# Patient Record
Sex: Male | Born: 1940
Health system: Southern US, Community
[De-identification: ages and names within clinical notes are randomized; demographics above are authoritative.]

## PROBLEM LIST (undated history)

## (undated) DIAGNOSIS — E785 Hyperlipidemia, unspecified: Secondary | ICD-10-CM

## (undated) DIAGNOSIS — K469 Unspecified abdominal hernia without obstruction or gangrene: Secondary | ICD-10-CM

## (undated) DIAGNOSIS — T7840XA Allergy, unspecified, initial encounter: Secondary | ICD-10-CM

## (undated) DIAGNOSIS — N400 Enlarged prostate without lower urinary tract symptoms: Secondary | ICD-10-CM

## (undated) DIAGNOSIS — N189 Chronic kidney disease, unspecified: Secondary | ICD-10-CM

## (undated) DIAGNOSIS — D696 Thrombocytopenia, unspecified: Secondary | ICD-10-CM

## (undated) DIAGNOSIS — K219 Gastro-esophageal reflux disease without esophagitis: Secondary | ICD-10-CM

## (undated) DIAGNOSIS — I219 Acute myocardial infarction, unspecified: Secondary | ICD-10-CM

## (undated) DIAGNOSIS — I2510 Atherosclerotic heart disease of native coronary artery without angina pectoris: Secondary | ICD-10-CM

## (undated) DIAGNOSIS — IMO0002 Reserved for concepts with insufficient information to code with codable children: Secondary | ICD-10-CM

## (undated) DIAGNOSIS — E119 Type 2 diabetes mellitus without complications: Secondary | ICD-10-CM

## (undated) DIAGNOSIS — I10 Essential (primary) hypertension: Secondary | ICD-10-CM

## (undated) DIAGNOSIS — R0789 Other chest pain: Secondary | ICD-10-CM

## (undated) DIAGNOSIS — K2270 Barrett's esophagus without dysplasia: Secondary | ICD-10-CM

## (undated) HISTORY — DX: Essential (primary) hypertension: I10

## (undated) HISTORY — DX: Allergy, unspecified, initial encounter: T78.40XA

## (undated) HISTORY — DX: Type 2 diabetes mellitus without complications: E11.9

## (undated) HISTORY — PX: CORONARY ARTERY BYPASS GRAFT: SHX141

## (undated) HISTORY — PX: SKIN CANCER EXCISION: SHX779

## (undated) HISTORY — DX: Atherosclerotic heart disease of native coronary artery without angina pectoris: I25.10

## (undated) HISTORY — DX: Other chest pain: R07.89

## (undated) HISTORY — DX: Gastro-esophageal reflux disease without esophagitis: K21.9

## (undated) HISTORY — DX: Benign prostatic hyperplasia without lower urinary tract symptoms: N40.0

## (undated) HISTORY — PX: COLONOSCOPY: SHX174

## (undated) HISTORY — PX: CHOLECYSTECTOMY: SHX55

## (undated) HISTORY — PX: LITHOTRIPSY: SUR834

## (undated) HISTORY — PX: INGUINAL HERNIA REPAIR: SUR1180

## (undated) HISTORY — DX: Barrett's esophagus without dysplasia: K22.70

## (undated) HISTORY — DX: Unspecified abdominal hernia without obstruction or gangrene: K46.9

## (undated) HISTORY — DX: Chronic kidney disease, unspecified: N18.9

## (undated) HISTORY — PX: UPPER GASTROINTESTINAL ENDOSCOPY: SHX188

## (undated) HISTORY — DX: Reserved for concepts with insufficient information to code with codable children: IMO0002

## (undated) HISTORY — DX: Acute myocardial infarction, unspecified: I21.9

## (undated) HISTORY — DX: Hyperlipidemia, unspecified: E78.5

## (undated) HISTORY — DX: Thrombocytopenia, unspecified: D69.6

---

## 1958-10-12 DIAGNOSIS — IMO0002 Reserved for concepts with insufficient information to code with codable children: Secondary | ICD-10-CM

## 1958-10-12 HISTORY — DX: Reserved for concepts with insufficient information to code with codable children: IMO0002

## 1999-03-11 ENCOUNTER — Encounter: Payer: Self-pay | Admitting: Cardiology

## 1999-03-11 ENCOUNTER — Inpatient Hospital Stay (HOSPITAL_COMMUNITY): Admission: EM | Admit: 1999-03-11 | Discharge: 1999-03-13 | Payer: Self-pay | Admitting: Emergency Medicine

## 2000-06-10 ENCOUNTER — Encounter: Payer: Self-pay | Admitting: Gastroenterology

## 2000-06-10 ENCOUNTER — Ambulatory Visit (HOSPITAL_COMMUNITY): Admission: RE | Admit: 2000-06-10 | Discharge: 2000-06-10 | Payer: Self-pay | Admitting: Gastroenterology

## 2000-06-16 ENCOUNTER — Encounter: Payer: Self-pay | Admitting: Gastroenterology

## 2000-06-16 ENCOUNTER — Ambulatory Visit (HOSPITAL_COMMUNITY): Admission: RE | Admit: 2000-06-16 | Discharge: 2000-06-16 | Payer: Self-pay | Admitting: Gastroenterology

## 2001-02-18 ENCOUNTER — Encounter: Payer: Self-pay | Admitting: Cardiovascular Disease

## 2001-02-18 ENCOUNTER — Inpatient Hospital Stay (HOSPITAL_COMMUNITY): Admission: EM | Admit: 2001-02-18 | Discharge: 2001-02-22 | Payer: Self-pay | Admitting: Emergency Medicine

## 2001-02-20 ENCOUNTER — Encounter: Payer: Self-pay | Admitting: Cardiovascular Disease

## 2002-03-17 ENCOUNTER — Inpatient Hospital Stay (HOSPITAL_COMMUNITY): Admission: EM | Admit: 2002-03-17 | Discharge: 2002-03-17 | Payer: Self-pay | Admitting: *Deleted

## 2002-03-17 ENCOUNTER — Encounter: Payer: Self-pay | Admitting: Cardiovascular Disease

## 2002-10-30 ENCOUNTER — Other Ambulatory Visit: Admission: RE | Admit: 2002-10-30 | Discharge: 2002-10-30 | Payer: Self-pay | Admitting: Family Medicine

## 2003-04-11 ENCOUNTER — Encounter: Payer: Self-pay | Admitting: Gastroenterology

## 2003-04-12 DIAGNOSIS — G4733 Obstructive sleep apnea (adult) (pediatric): Secondary | ICD-10-CM | POA: Insufficient documentation

## 2003-05-05 ENCOUNTER — Ambulatory Visit (HOSPITAL_BASED_OUTPATIENT_CLINIC_OR_DEPARTMENT_OTHER): Admission: RE | Admit: 2003-05-05 | Discharge: 2003-05-05 | Payer: Self-pay | Admitting: Family Medicine

## 2003-06-07 ENCOUNTER — Ambulatory Visit (HOSPITAL_BASED_OUTPATIENT_CLINIC_OR_DEPARTMENT_OTHER): Admission: RE | Admit: 2003-06-07 | Discharge: 2003-06-07 | Payer: Self-pay | Admitting: Family Medicine

## 2004-12-23 ENCOUNTER — Inpatient Hospital Stay (HOSPITAL_COMMUNITY): Admission: EM | Admit: 2004-12-23 | Discharge: 2004-12-24 | Payer: Self-pay | Admitting: Emergency Medicine

## 2006-12-13 ENCOUNTER — Ambulatory Visit (HOSPITAL_COMMUNITY): Admission: RE | Admit: 2006-12-13 | Discharge: 2006-12-13 | Payer: Self-pay | Admitting: Family Medicine

## 2006-12-21 ENCOUNTER — Ambulatory Visit (HOSPITAL_COMMUNITY): Admission: RE | Admit: 2006-12-21 | Discharge: 2006-12-21 | Payer: Self-pay | Admitting: Family Medicine

## 2007-01-06 ENCOUNTER — Ambulatory Visit (HOSPITAL_COMMUNITY): Admission: RE | Admit: 2007-01-06 | Discharge: 2007-01-06 | Payer: Self-pay | Admitting: Urology

## 2007-02-14 ENCOUNTER — Inpatient Hospital Stay (HOSPITAL_BASED_OUTPATIENT_CLINIC_OR_DEPARTMENT_OTHER): Admission: RE | Admit: 2007-02-14 | Discharge: 2007-02-14 | Payer: Self-pay | Admitting: Cardiovascular Disease

## 2007-02-14 HISTORY — PX: CARDIAC CATHETERIZATION: SHX172

## 2007-02-22 ENCOUNTER — Ambulatory Visit: Payer: Self-pay | Admitting: Surgery

## 2007-03-01 ENCOUNTER — Ambulatory Visit: Payer: Self-pay | Admitting: Cardiothoracic Surgery

## 2007-03-08 ENCOUNTER — Ambulatory Visit: Payer: Self-pay | Admitting: Cardiothoracic Surgery

## 2007-03-08 ENCOUNTER — Inpatient Hospital Stay (HOSPITAL_COMMUNITY): Admission: RE | Admit: 2007-03-08 | Discharge: 2007-03-14 | Payer: Self-pay | Admitting: Cardiothoracic Surgery

## 2007-04-12 ENCOUNTER — Encounter (HOSPITAL_COMMUNITY): Admission: RE | Admit: 2007-04-12 | Discharge: 2007-05-12 | Payer: Self-pay | Admitting: Cardiovascular Disease

## 2007-04-13 ENCOUNTER — Ambulatory Visit: Payer: Self-pay | Admitting: Cardiothoracic Surgery

## 2007-05-13 ENCOUNTER — Encounter (HOSPITAL_COMMUNITY): Admission: RE | Admit: 2007-05-13 | Discharge: 2007-06-12 | Payer: Self-pay | Admitting: Cardiovascular Disease

## 2007-06-13 ENCOUNTER — Encounter (HOSPITAL_COMMUNITY): Admission: RE | Admit: 2007-06-13 | Discharge: 2007-07-12 | Payer: Self-pay | Admitting: Cardiovascular Disease

## 2007-07-13 ENCOUNTER — Encounter (HOSPITAL_COMMUNITY): Admission: RE | Admit: 2007-07-13 | Discharge: 2007-08-12 | Payer: Self-pay | Admitting: Cardiovascular Disease

## 2008-09-03 ENCOUNTER — Encounter: Payer: Self-pay | Admitting: Gastroenterology

## 2008-10-14 ENCOUNTER — Emergency Department (HOSPITAL_COMMUNITY): Admission: EM | Admit: 2008-10-14 | Discharge: 2008-10-14 | Payer: Self-pay | Admitting: Emergency Medicine

## 2008-10-31 ENCOUNTER — Ambulatory Visit (HOSPITAL_COMMUNITY): Admission: RE | Admit: 2008-10-31 | Discharge: 2008-10-31 | Payer: Self-pay | Admitting: Urology

## 2008-11-08 ENCOUNTER — Ambulatory Visit: Payer: Self-pay | Admitting: Gastroenterology

## 2008-11-08 ENCOUNTER — Encounter: Payer: Self-pay | Admitting: Gastroenterology

## 2008-11-08 DIAGNOSIS — K270 Acute peptic ulcer, site unspecified, with hemorrhage: Secondary | ICD-10-CM | POA: Insufficient documentation

## 2008-11-08 DIAGNOSIS — K219 Gastro-esophageal reflux disease without esophagitis: Secondary | ICD-10-CM | POA: Insufficient documentation

## 2008-11-08 DIAGNOSIS — I10 Essential (primary) hypertension: Secondary | ICD-10-CM | POA: Insufficient documentation

## 2008-11-08 DIAGNOSIS — E785 Hyperlipidemia, unspecified: Secondary | ICD-10-CM | POA: Insufficient documentation

## 2008-11-08 DIAGNOSIS — N200 Calculus of kidney: Secondary | ICD-10-CM | POA: Insufficient documentation

## 2008-11-12 ENCOUNTER — Encounter: Payer: Self-pay | Admitting: Gastroenterology

## 2008-11-21 ENCOUNTER — Ambulatory Visit: Payer: Self-pay | Admitting: Gastroenterology

## 2008-11-21 ENCOUNTER — Encounter: Payer: Self-pay | Admitting: Gastroenterology

## 2008-11-23 ENCOUNTER — Encounter: Payer: Self-pay | Admitting: Gastroenterology

## 2009-01-03 IMAGING — CT CT ABDOMEN W/O CM
2 of 4 series · 16 of 46 positions shown, 18 images · non-contrast
Comparison: None

CT ABDOMEN

CLINICAL DATA: Left lower quadrant pain

CT ABDOMEN AND PELVIS WITHOUT CONTRAST
TECHNIQUE: Multidetector CT imaging of the abdomen and pelvis was
performed following the standard protocol without intravenous
contrast.

[Series 2: stone_wo 5.0 b40f st · axial · 0.71mm/px · z∈[-490,-78]mm · 13 of 113 slices shown, 15 images]
[im 5/113  soft-tissue]
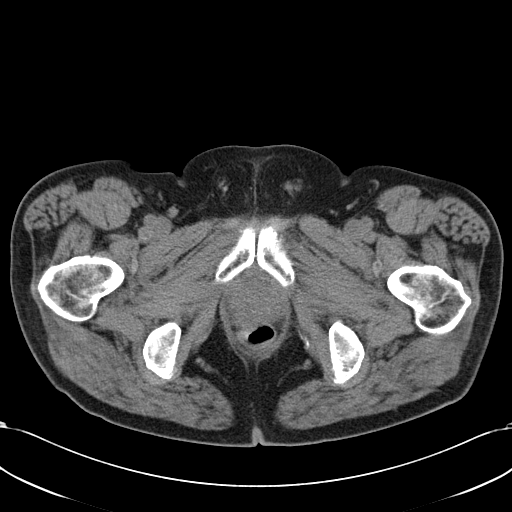
[im 5/113  bone]
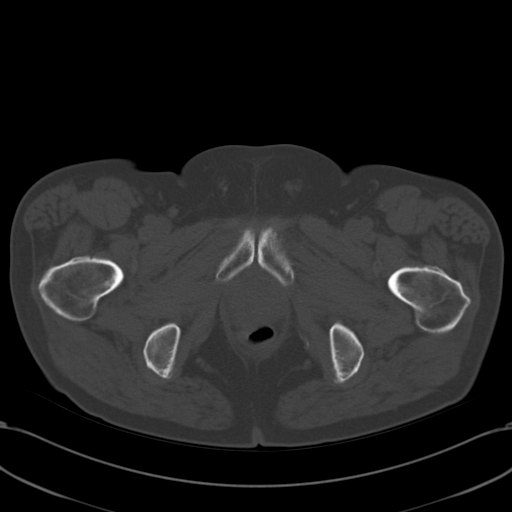
[im 15/113  soft-tissue]
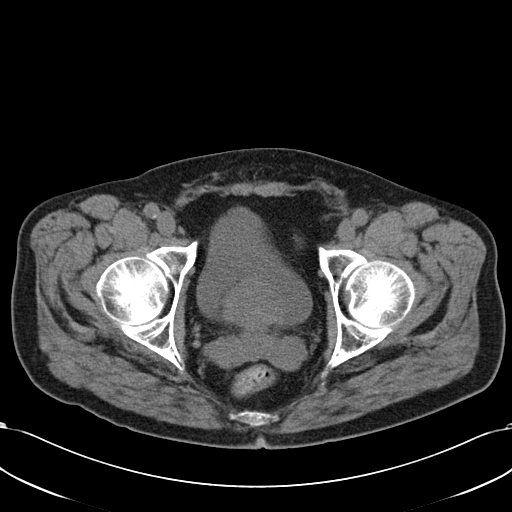
[im 24/113  soft-tissue]
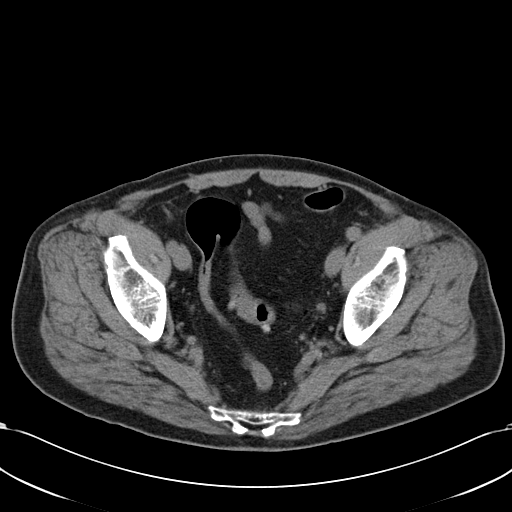
[im 33/113  soft-tissue]
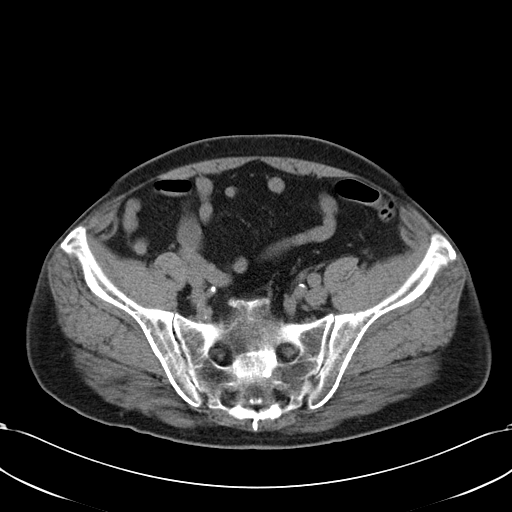
[im 38/113  soft-tissue]
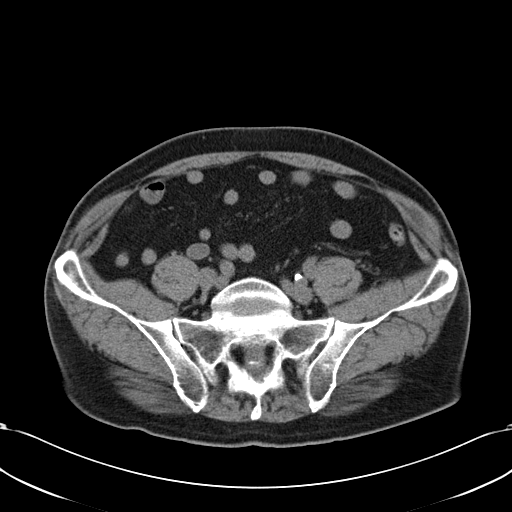
[im 47/113  soft-tissue]
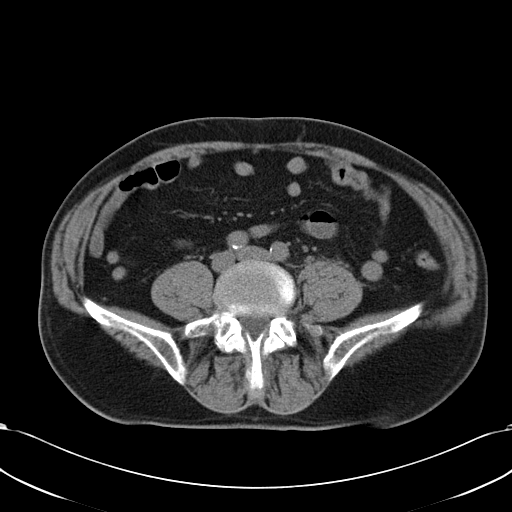
[im 57/113  soft-tissue]
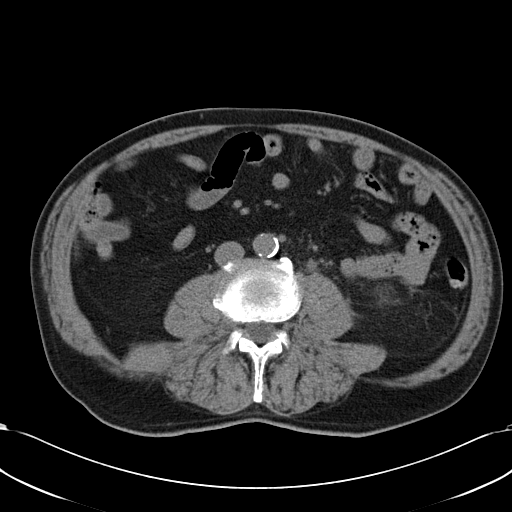
[im 66/113  soft-tissue]
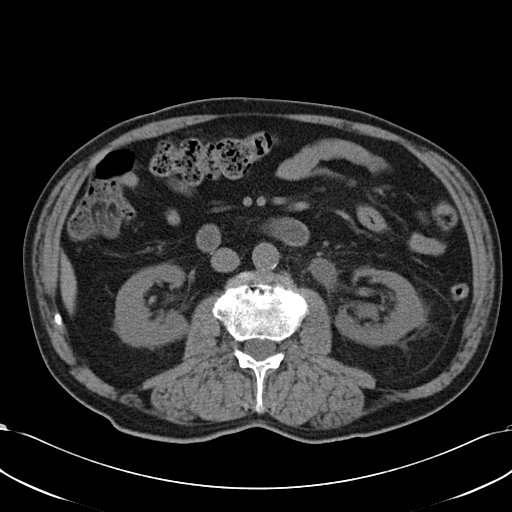
[im 75/113  soft-tissue]
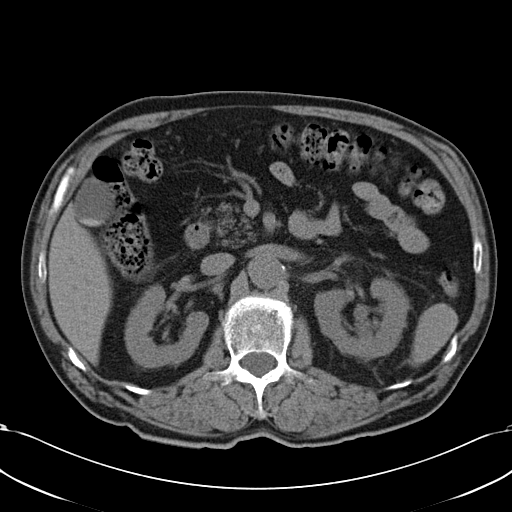
[im 75/113  bone]
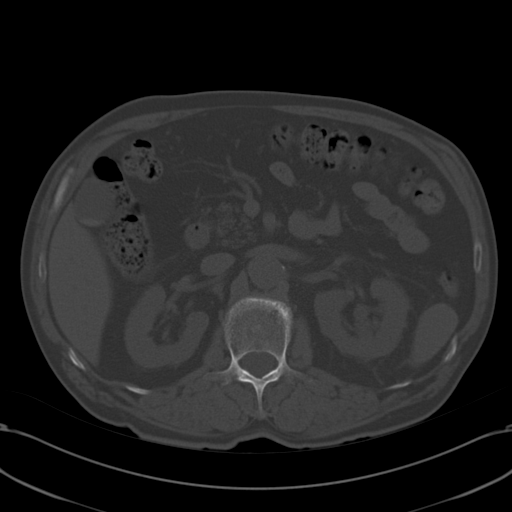
[im 80/113  soft-tissue]
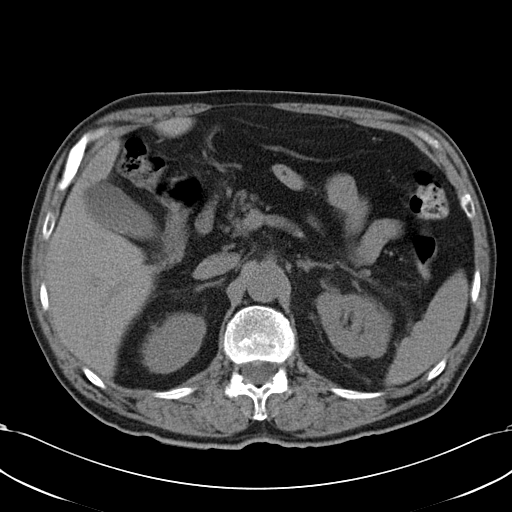
[im 89/113  soft-tissue]
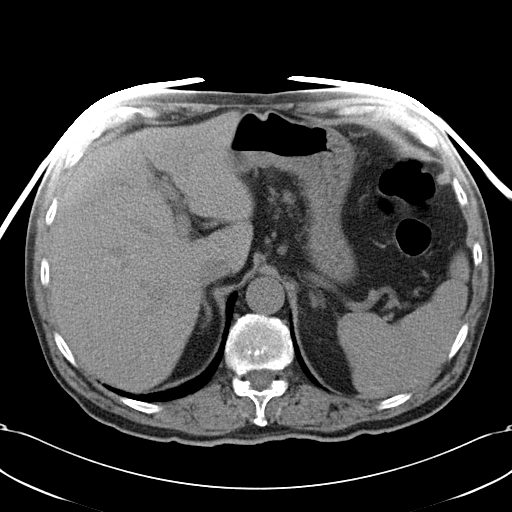
[im 99/113  soft-tissue]
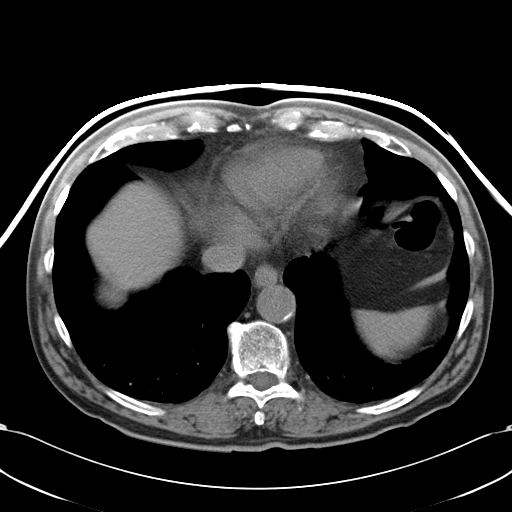
[im 108/113  soft-tissue]
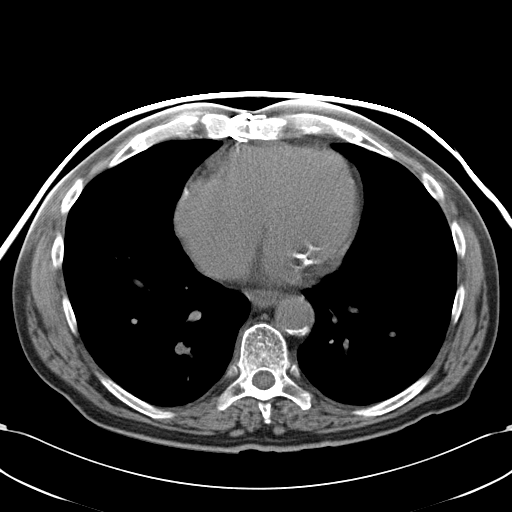

[Series 602: coronal abdomen · coronal · 0.92mm/px · 3 of 126 slices shown]
[im 42/126  soft-tissue]
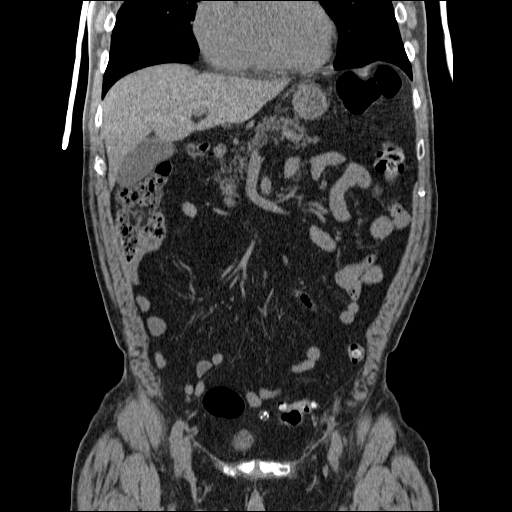
[im 56/126  soft-tissue]
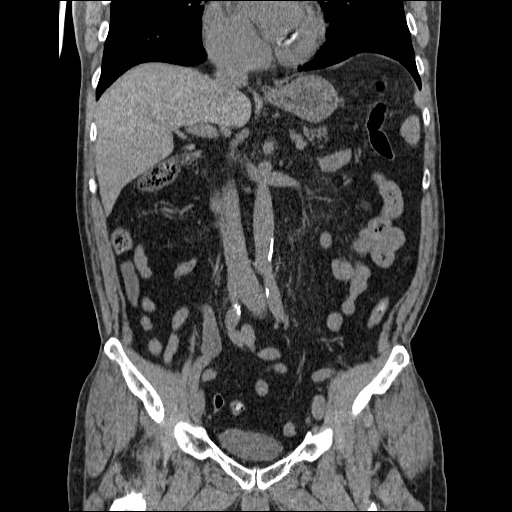
[im 70/126  soft-tissue]
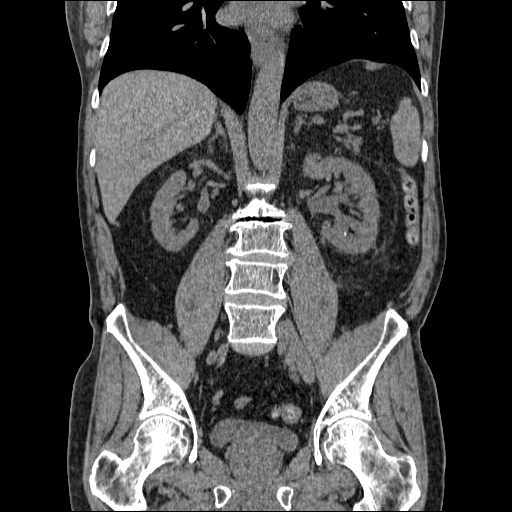

[16 of 46 positions shown; findings below may reference images not displayed]

FINDINGS: There is moderate left hydronephrosis and hydroureter.
No proximal ureteral stones.  9 mm stone in the lower pole of the
left kidney.  No stones on the right.  Mild left perinephric
stranding.

Liver, spleen, pancreas, adrenals have an unremarkable unenhanced
appearance.  Layering high-density material in the gallbladder,
likely small gallstones.  There is a normal retrocecal appendix.
Bowel grossly unremarkable.  No free fluid, free air, or
adenopathy. Mild atherosclerotic disease in the aorta.  No
aneurysm.

Scarring noted in the lung bases.  There is a small subpleural
nodule seen in the right middle lobe peripherally, measuring 7 mm.
No effusions.
IMPRESSION: Moderate left hydronephrosis and hydroureter.

9 mm left lower pole renal stone.

Cholelithiasis.

7 mm subpleural right middle lobe nodule. If the patient is at high
risk for bronchogenic carcinoma, follow-up chest CT at 3-6 months
is recommended.  If the patient is at low risk for bronchogenic
carcinoma, follow-up chest CT at 6-12 months is recommended.  This
recommendation follows the consensus statement: "Guidelines for
Management of Small Pulmonary Nodules Detected on CT Scans: A
Statement from the [HOSPITAL]" as published in Radiology
5229; [DATE]. Online at:
[URL]

CT PELVIS
FINDINGS: There is a distal left ureteral stone.  This measures 8
mm in transverse dimension and 11 mm in cranial caudal length.
Left ureter is dilated to the stone.  Right ureter is decompressed
as is the bladder.  No additional stones.

Prostate is significantly enlarged.  Prominent seminal vesicles
noted.  Scattered sigmoid diverticula.  Pelvic small bowel
unremarkable.  No free fluid, free air, or adenopathy.

Sclerotic focus in the right side of the sacrum noted, presumably
bone island.
IMPRESSION: 11 x 8 mm distal left ureteral stone.

Significantly enlarged prostate.

Sigmoid diverticulosis.

## 2009-02-01 ENCOUNTER — Emergency Department (HOSPITAL_COMMUNITY): Admission: EM | Admit: 2009-02-01 | Discharge: 2009-02-01 | Payer: Self-pay | Admitting: Emergency Medicine

## 2009-03-14 ENCOUNTER — Ambulatory Visit (HOSPITAL_COMMUNITY): Admission: RE | Admit: 2009-03-14 | Discharge: 2009-03-15 | Payer: Self-pay | Admitting: General Surgery

## 2009-03-14 ENCOUNTER — Encounter (INDEPENDENT_AMBULATORY_CARE_PROVIDER_SITE_OTHER): Payer: Self-pay | Admitting: General Surgery

## 2009-04-01 ENCOUNTER — Telehealth: Payer: Self-pay | Admitting: Gastroenterology

## 2009-07-23 ENCOUNTER — Encounter: Payer: Self-pay | Admitting: Gastroenterology

## 2010-05-15 ENCOUNTER — Emergency Department (HOSPITAL_COMMUNITY): Admission: EM | Admit: 2010-05-15 | Discharge: 2010-05-15 | Payer: Self-pay | Admitting: Emergency Medicine

## 2010-09-16 ENCOUNTER — Ambulatory Visit: Payer: Self-pay | Admitting: Cardiovascular Disease

## 2010-11-02 ENCOUNTER — Encounter: Payer: Self-pay | Admitting: Urology

## 2010-11-07 ENCOUNTER — Ambulatory Visit
Admission: RE | Admit: 2010-11-07 | Discharge: 2010-11-07 | Payer: Self-pay | Source: Home / Self Care | Attending: Urology | Admitting: Urology

## 2010-12-26 LAB — DIFFERENTIAL
Basophils Relative: 0 % (ref 0–1)
Eosinophils Relative: 0 % (ref 0–5)
Monocytes Absolute: 0.2 10*3/uL (ref 0.1–1.0)
Monocytes Relative: 2 % — ABNORMAL LOW (ref 3–12)
Neutro Abs: 10.5 10*3/uL — ABNORMAL HIGH (ref 1.7–7.7)

## 2010-12-26 LAB — URINE CULTURE
Colony Count: NO GROWTH
Culture: NO GROWTH

## 2010-12-26 LAB — COMPREHENSIVE METABOLIC PANEL
AST: 24 U/L (ref 0–37)
Albumin: 3.7 g/dL (ref 3.5–5.2)
Alkaline Phosphatase: 60 U/L (ref 39–117)
BUN: 18 mg/dL (ref 6–23)
GFR calc Af Amer: >60 mL/min (ref >60)
Potassium: 4 mEq/L (ref 3.5–5.1)
Sodium: 140 mEq/L (ref 135–145)
Total Protein: 6.1 g/dL (ref 6.0–8.3)

## 2010-12-26 LAB — CBC
MCV: 88.6 fL (ref 78.0–100.0)
Platelets: 124 10*3/uL — ABNORMAL LOW (ref 150–400)
RDW: 12.7 % (ref 11.5–15.5)
WBC: 11.4 10*3/uL — ABNORMAL HIGH (ref 4.0–10.5)

## 2010-12-26 LAB — POCT CARDIAC MARKERS
Myoglobin, poc: 78.1 ng/mL (ref 12–200)
Troponin i, poc: <0.05 ng/mL (ref 0.00–0.09)

## 2010-12-26 LAB — URINALYSIS, ROUTINE W REFLEX MICROSCOPIC
Nitrite: NEGATIVE
Specific Gravity, Urine: 1.019 (ref 1.005–1.030)

## 2010-12-26 LAB — URINE MICROSCOPIC-ADD ON

## 2010-12-26 LAB — BRAIN NATRIURETIC PEPTIDE: Pro B Natriuretic peptide (BNP): 111 pg/mL — ABNORMAL HIGH (ref 0.0–100.0)

## 2010-12-27 ENCOUNTER — Encounter: Payer: Self-pay | Admitting: Family Medicine

## 2010-12-27 DIAGNOSIS — I2510 Atherosclerotic heart disease of native coronary artery without angina pectoris: Secondary | ICD-10-CM | POA: Insufficient documentation

## 2010-12-27 DIAGNOSIS — G4700 Insomnia, unspecified: Secondary | ICD-10-CM

## 2010-12-27 DIAGNOSIS — Z9109 Other allergy status, other than to drugs and biological substances: Secondary | ICD-10-CM

## 2010-12-27 DIAGNOSIS — G4733 Obstructive sleep apnea (adult) (pediatric): Secondary | ICD-10-CM

## 2010-12-27 DIAGNOSIS — K2270 Barrett's esophagus without dysplasia: Secondary | ICD-10-CM | POA: Insufficient documentation

## 2010-12-27 DIAGNOSIS — A048 Other specified bacterial intestinal infections: Secondary | ICD-10-CM

## 2010-12-27 DIAGNOSIS — Z951 Presence of aortocoronary bypass graft: Secondary | ICD-10-CM

## 2011-01-19 LAB — CBC
HCT: 42.1 % (ref 39.0–52.0)
Platelets: 153 10*3/uL (ref 150–400)
WBC: 6 10*3/uL (ref 4.0–10.5)

## 2011-01-19 LAB — COMPREHENSIVE METABOLIC PANEL
AST: 23 U/L (ref 0–37)
Albumin: 3.9 g/dL (ref 3.5–5.2)
Alkaline Phosphatase: 62 U/L (ref 39–117)
Chloride: 108 mEq/L (ref 96–112)
GFR calc Af Amer: >60 mL/min (ref >60)
Potassium: 4 mEq/L (ref 3.5–5.1)
Total Bilirubin: 0.6 mg/dL (ref 0.3–1.2)

## 2011-01-19 LAB — DIFFERENTIAL
Basophils Absolute: 0 10*3/uL (ref 0.0–0.1)
Basophils Relative: 1 % (ref 0–1)
Eosinophils Relative: 1 % (ref 0–5)
Monocytes Absolute: 0.5 10*3/uL (ref 0.1–1.0)

## 2011-01-21 LAB — URINALYSIS, ROUTINE W REFLEX MICROSCOPIC
Glucose, UA: NEGATIVE mg/dL
Ketones, ur: 15 mg/dL — AB
Protein, ur: NEGATIVE mg/dL

## 2011-01-21 LAB — CBC
HCT: 42 % (ref 39.0–52.0)
Hemoglobin: 14.8 g/dL (ref 13.0–17.0)
RDW: 13 % (ref 11.5–15.5)

## 2011-01-21 LAB — COMPREHENSIVE METABOLIC PANEL
Alkaline Phosphatase: 57 U/L (ref 39–117)
BUN: 19 mg/dL (ref 6–23)
CO2: 24 mEq/L (ref 19–32)
GFR calc non Af Amer: >60 mL/min (ref >60)
Glucose, Bld: 144 mg/dL — ABNORMAL HIGH (ref 70–99)
Potassium: 4 mEq/L (ref 3.5–5.1)
Total Bilirubin: 0.8 mg/dL (ref 0.3–1.2)
Total Protein: 6.2 g/dL (ref 6.0–8.3)

## 2011-01-21 LAB — DIFFERENTIAL
Basophils Absolute: 0 10*3/uL (ref 0.0–0.1)
Basophils Relative: 0 % (ref 0–1)
Monocytes Relative: 5 % (ref 3–12)
Neutro Abs: 7.1 10*3/uL (ref 1.7–7.7)
Neutrophils Relative %: 80 % — ABNORMAL HIGH (ref 43–77)

## 2011-01-21 LAB — LIPASE, BLOOD: Lipase: 19 U/L (ref 11–59)

## 2011-01-21 LAB — POCT CARDIAC MARKERS: Myoglobin, poc: 72.7 ng/mL (ref 12–200)

## 2011-01-21 LAB — URINE MICROSCOPIC-ADD ON

## 2011-01-26 LAB — POCT I-STAT, CHEM 8
BUN: 23 mg/dL (ref 6–23)
Calcium, Ion: 1.09 mmol/L — ABNORMAL LOW (ref 1.12–1.32)
Creatinine, Ser: 1.1 mg/dL (ref 0.4–1.5)
Glucose, Bld: 178 mg/dL — ABNORMAL HIGH (ref 70–99)
HCT: 43 % (ref 39.0–52.0)
Hemoglobin: 14.6 g/dL (ref 13.0–17.0)

## 2011-01-26 LAB — CBC
HCT: 42.9 % (ref 39.0–52.0)
Hemoglobin: 14.8 g/dL (ref 13.0–17.0)
WBC: 10.8 10*3/uL — ABNORMAL HIGH (ref 4.0–10.5)

## 2011-01-26 LAB — DIFFERENTIAL
Eosinophils Relative: 0 % (ref 0–5)
Lymphocytes Relative: 9 % — ABNORMAL LOW (ref 12–46)
Lymphs Abs: 1 10*3/uL (ref 0.7–4.0)
Monocytes Absolute: 0.4 10*3/uL (ref 0.1–1.0)

## 2011-01-26 LAB — HEMOGLOBIN AND HEMATOCRIT, BLOOD
HCT: 41.1 % (ref 39.0–52.0)
Hemoglobin: 14 g/dL (ref 13.0–17.0)

## 2011-01-26 LAB — BASIC METABOLIC PANEL
BUN: 16 mg/dL (ref 6–23)
Chloride: 109 mEq/L (ref 96–112)
GFR calc Af Amer: >60 mL/min (ref >60)
GFR calc non Af Amer: >60 mL/min (ref >60)
Potassium: 3.9 mEq/L (ref 3.5–5.1)
Sodium: 141 mEq/L (ref 135–145)

## 2011-01-26 LAB — URINALYSIS, ROUTINE W REFLEX MICROSCOPIC
Bilirubin Urine: NEGATIVE
Glucose, UA: NEGATIVE mg/dL
Ketones, ur: 40 mg/dL — AB
Protein, ur: 100 mg/dL — AB
pH: 5.5 (ref 5.0–8.0)

## 2011-01-26 LAB — URINE MICROSCOPIC-ADD ON

## 2011-02-24 NOTE — Discharge Summary (Signed)
Andrew [name], Andrew [name] NO.:  [id number]   MEDICAL RECORD NO.:  [id number]          PATIENT TYPE:  INP   LOCATION:  2027                         FACILITY:  [facility]   PHYSICIAN:  Kerin Perna, M.D.  DATE OF BIRTH:  [birth year]   DATE OF ADMISSION:  03/08/2007  DATE OF DISCHARGE:                               DISCHARGE SUMMARY   PRIMARY DIAGNOSIS:  Recurrent coronary artery disease with class III  progressive angina.   IN-HOSPITAL DIAGNOSES:  1. Volume overload postoperatively.  2. Acute blood loss anemia postoperatively.   IN-HOSPITAL OPERATIONS AND PROCEDURES:  1. Redo coronary artery bypass grafting x2 using a left radial artery      graft to distal left anterior descending artery, saphenous vein      graft to obtuse marginal #1.  2. Endoscopic saphenous vein harvest from left leg greater saphenous      vein.   HISTORY AND PHYSICAL AND HOSPITAL COURSE:  The patient is a 70 year old  male who underwent coronary bypass grafting x2 on an urgent basis after  angioplasty in 1997.  He recently developed recurrent angina and was  found by cardiac catheterization to progressive coronary artery disease  with disease of the LAD distal to his mammary artery graft and disease  of the OM-1.  The vein graft previously placed to the anterolateral  branch of the LAD was widely patent.  A mammary artery was patent, but  small.  Redo coronary artery bypass graft was indicated.  Dr. Donata Clay  was consulted.  Dr. Donata Clay evaluated the patient.  He discussed with  the patient undergoing redo coronary bypass grafting.  He discussed  risks and benefits with the patient.  The patient acknowledges  understanding and agreed to proceed.  Surgery was scheduled for Mar 08, 2007.  For details of the patient's past medical history and physical  exam, please see dictated H&P.   The patient was taken to the operating room on Mar 08, 2007, where he  underwent redo coronary artery  bypass grafting x2 using a left radial  artery graft to the distal left anterior descending artery and saphenous  vein graft to obtuse marginal #1.  The patient had endoscopic saphenous  vein harvested from left leg greater saphenous vein as well as open left  radial artery harvest.  The patient tolerated this procedure well and  was transferred to the intensive care unit in stable condition.  Immediately postoperatively, the patient was noted to be hemodynamically  stable.  He was extubated the evening of surgery.  Post extubation, the  patient was noted to be alert and oriented x4 and neurovascularly  intact.  Postop day #1, the patient remained stable.  Vitals were  monitored and appropriate.  All drips were discontinued.  Postoperative  chest x-ray showed mild atelectasis, otherwise stable.  All chest tubes  and Swan-Ganz catheter were discontinued.  The patient was noted to be  hemodynamically stable.  He did have acute blood loss anemia with  hematocrit of 23%; this was followed closely.  The patient was  transferred out  to 2000 postop day #2.  His hemoglobin and hematocrit  dropped on postop day #3 to 7.3 and 20.9%.  The patient was asymptomatic  with the acute blood loss anemia.  The patient was started on p.o. iron  as well as folic acid.  His hemoglobin and hematocrit were followed  closely.  We will obtain a hemoglobin and hematocrit recheck prior to  discharge home.  Postoperatively, the patient remained in normal sinus  rhythm.  His pulmonary status remained stable.  He was able to be weaned  off oxygen, saturating greater than 90% on room air.  The patient's  incisions were clean, dry and intact and healing well.  He was out of  bed ambulating well with assistance.  He was tolerating diet well, no  nausea or vomiting noted.  The patient's vital signs remained stable and  he remained afebrile.   LABORATORIES AT DISCHARGE:  Labs postop day #3 showed a white count of  6.3,  hemoglobin of 7.3, hematocrit 20.9, platelet count of 105,000.  Sodium was 138, potassium was 3.7, chloride of 104, bicarb of 20, BUN of  19, creatinine 0.83, glucose of 123.   The patient is tentatively ready for discharge home in the next 2 days;  this will be postop day #5.   FOLLOWUP APPOINTMENTS:  A followup appointment has been arranged with  Dr. Donata Clay for April 08, 2007 at 11:30 a.m.  The patient will need to  follow up with Dr. Elease Hashimoto in 2 weeks.  He will need to contact Dr.  Harvie Bridge office to arrange this appointment.   ACTIVITY:  The patient is instructed in no driving until released to do  so, no heavy lifting over 10 pounds.  He is told to ambulate 3-4 times  per day, progress as tolerated and continue his breathing exercises.   INCISIONAL CARE:  The patient is told to shower, washing his incisions  using soap and water.  He is to contact the office if he develops any  drainage or opening from any of his incision sites.   DIET:  The patient was educated on diet to be low-fat, low-salt.   DISCHARGE MEDICATIONS:  1. Aspirin 81 mg daily.  2. Toprol-XL 50 mg daily.  3. Altace 2.5 mg daily.  4. Zocor 10 mg at night.  5. __________  500 mg at night.  6. Prevacid 30 mg daily.  7. Imdur 15 mg daily.  8. Lasix 40 mg daily for 3 days.  9. Potassium chloride 20 mEq daily for 3 days.  10.Niferex 150 mg daily.  11.Folic acid 1 mg daily.  12.Zolpidem 10 mg at night as needed.  13.Oxycodone 5 mg one to two tablets q.4-6 h. p.r.n. pain.      Theda Belfast, [place]      Kerin Perna, M.D.  Electronically Signed    KMD/MEDQ  D:  03/11/2007  T:  03/11/2007  Job:  [id number]   cc:   Vesta Mixer, M.D.  Ernestina Penna, M.D.

## 2011-02-24 NOTE — Op Note (Signed)
Andrew [name], Andrew [name]                 ACCOUNT NO.:  [id number]   MEDICAL RECORD NO.:  [id number]          PATIENT TYPE:  OIB   LOCATION:  5121                         FACILITY:  [facility]   PHYSICIAN:  Adolph Pollack, M.D.DATE OF BIRTH:  [birth year]   DATE OF PROCEDURE:  03/14/2009  DATE OF DISCHARGE:                               OPERATIVE REPORT   PREOPERATIVE DIAGNOSIS:  Symptomatic cholelithiasis.   POSTOPERATIVE DIAGNOSIS:  Symptomatic cholelithiasis.   PROCEDURE:  Laparoscopic cholecystectomy with intraoperative  cholangiogram.   SURGEON:  Adolph Pollack, MD   ASSISTANT:  Lennie Muckle, MD   ANESTHESIA:  General.   ADMISSION:  Andrew [name] is a 70 year old male who ate some fish at  [employer] and had a classic case of biliary colic.  He had multiple gallstones demonstrated on imaging studies.  With  medication, the symptoms resolved.  He now presents for elective  laparoscopic cholecystectomy.  He has had cardiac clearance.  Procedure  risks and aftercare were discussed with him preoperatively.   TECHNIQUE:  He was brought to the operating room and placed supine on  the operating table and general anesthetic was administered.  His  abdominal wall was sterilely prepped and draped.  Of note is that he has  previous repair of perforated duodenal ulcer in the distant past.  Marcaine was infiltrated in the subumbilical region.  A small  subumbilical incision was made through the skin and subcutaneous tissue,  fascial layers, and peritoneum entering the peritoneal cavity under  direct vision.  A purse-string suture of 0 Vicryl was placed around the  fascial edges.  A Hasson trocar was introduced into the peritoneal  cavity and pneumoperitoneum was created by insufflation of CO2 gas.   Next, a laparoscope was introduced.  There adhesions noted between the  omentum and the anterior abdominal wall and the midline and right upper  quadrant area.  We were able to  get a 5-mm trocar into the right upper  quadrant using sharp dissection and lysed these adhesions freeing the  omentum and the anterior abdominal wall and exposing the right upper  quadrant.  He was placed in the reverse Trendelenburg position the right  side tilted slightly up.  An 11-mm trocar was placed in an epigastric  incision and another 5-mm trocar was placed in the right upper quadrant.  The fundus of the gallbladder was grasped and retracted toward the right  shoulder.  Thin omental adhesions were taken down bluntly.  I then used  blunt dissection to mobilize the infundibulum with dissection on the  gallbladder.  The infundibulum was retracted laterally.  I then  identified the cystic duct and created a window around it and was able  to see the critical view.  A clip was placed above the cystic duct  gallbladder junction.  An incision was made at the cystic gallbladder  junction.  I milked the cystic duct and milked the stone out of it.  I  then passed a cholangiocatheter through the anterior abdominal wall and  put it into the cystic duct and a cholangiogram  was performed.   Under real time fluoroscopy, dilute contrast was injected into the  cystic duct which was of moderate length.  Common hepatic, right and  left hepatic, and common bile ducts all filled with contrast and  contrast drained promptly into the duodenum without obvious evidence of  obstruction.  Final report was pending radiologist interpretation.   The cholangiocatheter was removed.  The cystic duct was then clipped 3  times proximally and divided.  I then identified an anterior branch of  the cystic artery and create a window around it.  It was clipped and  divided.  A posterior branch of the cystic artery was identified and a  window was created around it.  It was clipped and divided.  I then  dissected the gallbladder free using electrocautery from the liver.  There was a small hole in the gallbladder from  retraction, leaked some  bile, but no stones.  The gallbladder was placed in the Endopouch bag.  While freeing the last bit of the fundus from the liver, there is a  question of superficial serosal burn from the cautery on the hepatic  flexure of the right colon.  I inspected this multiple times and it was  unclear whether this was just secondary to adhesion or burn.  Because of  this, I went ahead and just imbricated this area with a single 3-0  Vicryl stitch in a Lembert-type fashion.   Following this, I copiously irrigated out the gallbladder fossa and it  curled the bleeding from the gallbladder fossa with electrocautery.  Further irrigation and inspection demonstrated no evidence of further  bleeding and no bile leak.  Irrigation fluid was evacuated as much as  possible.   The gallbladder was then removed through the subumbilical port and the  subumbilical fascial defect was closed under laparoscopic vision by  tightening up and tying down the purse-string suture.  The remaining  trocars were removed and pneumoperitoneum was released.   The skin incisions were closed with 4-0 Monocryl subcuticular stitches  followed by Steri-Strips and sterile dressings.  He tolerated the  procedure well without any apparent complications and was taken to the  recovery room in satisfactory condition.      Adolph Pollack, M.D.  Electronically Signed     TJR/MEDQ  D:  03/14/2009  T:  03/14/2009  Job:  [id number]   cc:   Vesta Mixer, M.D.  Ernestina Penna, M.D.

## 2011-02-24 NOTE — Assessment & Plan Note (Signed)
OFFICE VISIT   MANDRELL, VANGILDER D  DOB:  05-May-1941                                        April 13, 2007  CHART #:  [id number]   CURRENT PROBLEMS:  1. Status post redo CABG times 2 on Mar 08, 2007.  2. Hypertension.  3. Hyperlipidemia.  4. Status post CABG in 1996.   PRESENT ILLNESS:  [name] is a 70 year old male who developed  recurrent progressive coronary artery disease after bypass surgery in  1996 for unstable angina with disease to the LAD- diagonal. He underwent  left IMA grafting to the LAD and a vein graft to the first diagonal.  Recently he developed a class 3 angina and a cardiac catheterization  demonstrated a patent vein graft to the diagonal and an necrotic IMA  graft to the LAD. The distal LAD was non perfused by the vein graft. He  had also had a 78% stenosis  of an OM1. The patient was felt to be a  candidate for redo surgery and underwent radial artery graft to his LAD,  and a vein graft to the OM1. He has done well following surgery and has  maintained a sinus rhythm. He was discharged home after the fifth  postoperative day on;  1. Aspirin.  2. Toprol XL.  3. Altace 2.5.  4. Zocor.  5. Iron.  6. Vitamins.  7. Pain medication.  He has had no recurrent angina or difficultly with his surgical incision  following discharge from the hospital. He is scheduled to begin cardiac  rehab next week and is anxious to begin driving, and to increase his  activity level.   PHYSICAL EXAMINATION:  Blood pressure 105/60, pulse 70, respirations 18,  saturation 98% on room air. He is comfortable and alert. Breath sounds  are clear and equal. Sternum is stable and well-healed. Cardiac rhythm  is regular without S3 gallop, rub, or murmur. His leg incision is well-  healed and there is no peripheral edema. The left arm incision is well-  healed and he has a good hand grip and a warm left hand.   PLAN:  The patient has done well 1 month after surgery.  He is ready to  start driving and to enter the outpatient cardiac rehabilitation  program. He understands he can lift up to 20 pounds until late August,  at which time he will have no further restrictions from the surgery. He  will discontinue the iron and vitamins, but  continue his other cardiac  medications including the metoprolol, Altace, Zocor, aspirin, and  niacin. He will return here as needed.   Kerin Perna, M.D.  Electronically Signed   PV/MEDQ  D:  04/13/2007  T:  04/14/2007  Job:  [id number]   cc:   Vesta Mixer, M.D.  Ernestina Penna, M.D.

## 2011-02-24 NOTE — Op Note (Signed)
Andrew [name], Andrew [name]                 ACCOUNT NO.:  [id number]   MEDICAL RECORD NO.:  [id number]          PATIENT TYPE:  AMB   LOCATION:  DAY                          FACILITY:  [facility]   PHYSICIAN:  Ronald L. Earlene Plater, M.D.  DATE OF BIRTH:  [birth year]   DATE OF PROCEDURE:  10/31/2008  DATE OF DISCHARGE:                               OPERATIVE REPORT   DIAGNOSES:  Left ureterolithiasis with hydronephrosis.   OPERATIVE PROCEDURE:  Cystourethroscopy, left ureteroscopy with removal  of fragmented ureteral calculus and placement of left double-J stent  with a tether.   SURGEON:  Dr. Gaynelle Arabian.   ANESTHESIA:  LMA.   ESTIMATED BLOOD LOSS:  Negligible.   TUBES:  A 26 cm 6-French contoured double pigtail stent with a string  tether.   COMPLICATIONS:  None.   INDICATIONS FOR PROCEDURE:  [name] is a very nice 70 year old white male  who presented with left lower quadrant pain, nausea and vomiting.  He  was subsequently found on CT scan to have an 11 x 8 mm distal left  calculus with hydroureteronephrosis.  He notes he has had some  intermittent mild pain.  He has been cleared by cardiac and after  understanding risks, benefits and alternatives elected proceed with the  above procedure.   PROCEDURE IN DETAIL:  The patient was placed in supine position.  After  proper LMA anesthesia was placed in the dorsal lithotomy position and  prepped and draped with Betadine in a sterile fashion.  Cystourethroscopy was with a 22.5 [place] Olympus panendoscope.  He was  noted to have mild trilobar hypertrophy with a medium bar.  The bladder  was smooth-walled and efflux of urine was noted from the right ureteral  orifice which was in normal location.  The left ureteral orifice was in  normal location and it was somewhat dilated.  There were on number of  fragments in the bladder, one very large, and these were removed.  It  was felt that the stone had most likely passed, however, it was felt  ureteroscopy was indicated.  Under fluoroscopic guidance, an 0.038  sensor wire was placed into the left renal pelvis and the distal ureter  was dilated with inner dilating sheath through ureteral access sheath.  It was noted in the lower ureter there was a tight area which was  dilated.  Ureteroscopy was then performed.  The area was noted to be  open, but obviously it was somewhat inflamed and above it was a  significant hydroureteronephrosis.  The scope was passed to the  ureteropelvic junction.  There were no other fragments remaining and the  scope was removed visually.  There were no perforations, no bleeding and  no other fragments in the area.  The narrowing was widely patent.  The  ureteroscope was visually removed and under fluoroscopic guidance a 26-  cm 6-French contoured double pigtail stent with a tether was placed and  noted to be in good position within the bladder and  within the left renal pelvis.  When the bladder was drained, the  panendoscope was removed.  The string was taped to the penis and again  the stent was confirmed to be in good location fluoroscopically.  The  patient was taken to the recovery room stable.      Ronald L. Earlene Plater, M.D.  Electronically Signed     RLD/MEDQ  D:  10/31/2008  T:  10/31/2008  Job:  [id number]

## 2011-02-24 NOTE — Op Note (Signed)
Andrew, [name]                 ACCOUNT NO.:  [id number]   MEDICAL RECORD NO.:  [id number]          PATIENT TYPE:  INP   LOCATION:  2308                         FACILITY:  [facility]   PHYSICIAN:  Kerin Perna, M.D.  DATE OF BIRTH:  27-Aug-1941   DATE OF PROCEDURE:  DATE OF DISCHARGE:                               OPERATIVE REPORT   OPERATION:  1. Redo coronary artery bypass grafting x2 (left radial artery graft      to distal left anterior descending artery, saphenous vein graft to      obtuse marginal #1).  2. Endoscopic saphenous vein harvest of the left leg greater saphenous      vein.   SURGEON:  Kerin Perna, M.D.   ASSISTANTS:  Salvatore Decent. Dorris Fetch, M.D., and Coral Ceo, PA-C.   PREOPERATIVE DIAGNOSIS:  Recurrent coronary artery disease with class  III progressive angina.   POSTOPERATIVE DIAGNOSIS:  Recurrent coronary artery disease with class  III progressive angina.   ANESTHESIA:  General.   INDICATIONS:  The patient is a 70 year old male who underwent coronary  bypass grafting x2 on an urgent basis after an angioplasty in 1997.  He  recently developed recurrent angina was found by cardiac catheterization  to have progressive coronary artery disease with disease of the LAD  distal to his mammary artery graft and disease of the OM-1.  The vein  graft previously placed to the anterolateral branch of the LAD was  widely patent.  The mammary artery was patent but small.  A redo  coronary bypass grafting was felt to be indicated for relief of his  symptoms and preservation of LV function.   Prior to surgery I examined the patient in the office and reviewed the  results of the cardiac catheterization with the patient and family.  I  discussed indications and expected benefits of redo coronary artery  bypass surgery for treatment of his recurrent coronary artery disease.  I reviewed the alternatives to surgical therapy as well.  I discussed  with the patient the  major aspects of planned procedure including the  choice of conduit to include left radial artery and endoscopically  harvest of the left leg vein, the location of the surgical incisions,  the use of general anesthesia and cardiopulmonary bypass, and the  expected postoperative hospital recovery period.  I reviewed with the  patient the risks to him of redo coronary artery bypass surgery  including risks of MI, CVA, bleeding, infection, and death.  He  understood these implications for the surgery and agreed to proceed with  the operation as planned under what I felt was an informed consent.   OPERATIVE FINDINGS:  The vein was a good conduit, harvested from the  left leg endoscopically.  The left radial artery was a good conduit and  was harvested with an open technique from the left forearm.  The  previously-placed vein graft to the diagonal and mammary artery graft to  the LAD were identified and protected during the operation.  The patient  did not require any packed blood cells but did receive  a unit of  platelets at the termination of bypass after reversal of heparin due to  persistent coagulopathy.   PROCEDURE:  The patient was brought to operating room and placed supine  on the operating table.  General anesthesia was induced under invasive  hemodynamic monitoring.  The chest, abdomen and legs were prepped with  Betadine and draped as a sterile field.  A transesophageal 2-D echo  showed trace 1+ mitral regurgitation with overall well-preserved LV  function.  A redo sternal incision was made using the oscillating saw  with care being taken to avoid injury to the underlying vascular  structures.  The vein was harvested from the left thigh endoscopically  and left forearm (radial artery).  The anterior mediastinum was then  dissected out from dense adhesions.  The ascending aorta, right atrium,  right ventricle and the vein graft to the mammary artery graft were all  dissected and  identified and protected.  At this point the two conduits  were checked and found be adequate and heparin was administered.  The  sternal retractor was placed and pursestrings were placed in the  ascending aorta and right atrium.  The patient was then cannulated and  placed on bypass, where the remainder of the dissection was performed  with the exception of directly posterior adhesions, which were left  intact until the crossclamp was applied.  Cardioplegia catheters were  placed for both antegrade aortic and retrograde coronary sinus  cardioplegia.  The patient was cooled to 32 degrees and the aortic  crossclamp was applied.  Cold blood cardioplegia 800 mL was delivered in  split doses between the antegrade and retrograde catheters.  There was a  good cardioplegic arrest.   The remainder of the dissection was then carried out around the heart.  The coronaries were identified for grafting, those being the distal LAD  and the OM-1.  Each was a 1.5-mm vessel.   First the distal anastomosis to the OM-1 was constructed with a reversed  saphenous vein graft.  The OM-1 was 1.5 mm and had a proximal 80%  stenosis.  A reversed saphenous vein was sewn end-to-side with running 7-  0 Prolene.  There was good flow through the graft.  The second distal  anastomosis was the left radial free graft to the distal LAD.  It was a  1.5-mm vessel and it had a proximal 90% stenosis.  An end-to-side  anastomosis with a radial artery was constructed using running 8-0  Prolene.  There was good flow through the graft.  Cardioplegia was  redosed.   While the crossclamp was still in place, two proximal anastomoses were  performed.  The radial artery was placed onto the hood of the previously-  placed vein graft to the diagonal.  This was a normal, soft vein without  thickening or scarring which was present on the ascending aorta.  This was constructed using running 7-0 Prolene.  The proximal OM-1 and vein   anastomosis of the aorta was performed with a separate aortic punch of  4.0 mm and a running 6-0 Prolene.  Prior to tying down the final  proximal anastomosis, the air was vented from the coronaries and the  left side of heart using a dose of retrograde warm blood cardioplegia.  The crossclamp was then removed.   The heart resumed a spontaneous rhythm.  Air was aspirated from the vein  grafts and the cardioplegia catheters were removed.  The patient was  rewarmed and reperfused.  Proximal and distal anastomoses were checked  and found to be hemostatic.  The vein and the radial artery graft had  good flow.  Temporary pacing wires were applied.  The lungs were re-  expanded and the ventilator was resumed.  When the patient had reached  37 degrees, he was weaned off bypass without difficulty.  Cardiac output  and blood pressure were stable.  Protamine was administered without  adverse reaction.  The mediastinum was irrigated with warm antibiotic  irrigation.  The leg incision and arm incision had been closed in layers  using Vicryl in a standard fashion.  Two mediastinal and a right pleural  chest tube were placed and brought out through separate incisions.  The  patient was given platelets for post-protamine coagulopathy.  Sternal wires were placed.  The pectoralis fascia was closed with a  running #1 Vicryl.  The subcutaneous and skin layers were closed using  running Vicryl and sterile dressings were applied.  Total bypass time  was 125 minutes with crossclamp time of 80 minutes.      Kerin Perna, M.D.  Electronically Signed     PV/MEDQ  D:  03/08/2007  T:  03/08/2007  Job:  [id number]   cc:   [facility] Office  Vesta Mixer, M.D.

## 2011-02-24 NOTE — Cardiovascular Report (Signed)
[name], [name] NO.:  [id number]   MEDICAL RECORD NO.:  [id number]          PATIENT TYPE:  OIB   LOCATION:  1965                         FACILITY:  [facility]   PHYSICIAN:  Vesta Mixer, M.D. DATE OF BIRTH:  21-Aug-1941   DATE OF PROCEDURE:  02/14/2007  DATE OF DISCHARGE:                            CARDIAC CATHETERIZATION   CLINICAL HISTORY:  Andrew [name] is a 70 year old gentleman with a history  of coronary artery disease.  He is status post coronary bypass grafting.  He has a known atretic left internal mammary artery and his LAD is  filled via the saphenous vein graft to the diagonal vessel.  He has been  having episodes of chest pain for the past week or so.  The chest pain  has been relieved with sublingual nitroglycerin.  He is referred for  outpatient heart catheterization for further evaluation.   The right femoral artery was easily cannulated using the modified  Seldinger technique.   HEMODYNAMIC RESULTS:  LV pressure was 91/10 with an aortic pressure of  91/51.   ANGIOGRAPHIC DATA:  The left main is fairly smooth and normal.   The left anterior descending artery is occluded proximally.   Left circumflex artery is a large and dominant vessel.  It has minor  luminal irregularities throughout its course.  The first-obtuse marginal  branch is approximately 2 mm in diameter and has a 90% stenosis at its  midpoint.  This was based on change from his previous catheterization 2  years ago.   The posterior descending artery is unremarkable.   The right coronary artery is small and is nondominant.  It supplies very  small RV marginal branches only.   The saphenous vein graft to the second diagonal artery is a large graft.  Anastomosis to the diagonal vessel is normal.  The LAD can be seen  filling in a retrograde fashion up from the second diagonal vessel.  The  LAD fills fairly normal in retrograde fashion, but then going antegrade  there is a 90%  stenosis just after the origin of the second diagonal  vessel.  This effectively limits flow to the mid and distal LAD because  of this tight stenosis.   The left internal mammary artery is atretic and does not supply any  significant amount of myocardium.   The left ventriculogram was performed in a 30 RAO position.  It revealed  significant catheter induced mitral regurgitation.  The left ventricular  systolic function was normal.   COMPLICATIONS:  None.   CONCLUSIONS:  Significant coronary artery disease involving the left  anterior descending artery.  He has developed a stenosis in the LAD  which restricts flow to the mid/distal LAD.  His entire LAD is fed from  the saphenous vein graft to the second diagonal artery and the flow to  the LAD is in a retrograde fashion up the diagonal  vessel.  It would be a very challenging angioplasty to try and maneuver  wire down the saphenous vein graft up the diagonal and then down the LAD  again.  Our best  option may be to consult CVTS for redo operation.  We  will review these films and discuss with the patient and his family.           ______________________________  Vesta Mixer, M.D.     PJN/MEDQ  D:  02/14/2007  T:  02/14/2007  Job:  [id number]

## 2011-02-24 NOTE — Op Note (Signed)
NAMEJAKSEN, FIORELLA                 ACCOUNT NO.:  [id number]   MEDICAL RECORD NO.:  [id number]          PATIENT TYPE:  OIB   LOCATION:  5120                         FACILITY:  [facility]   PHYSICIAN:  Almond Lint, MD       DATE OF BIRTH:  03-24-1941   DATE OF PROCEDURE:  03/15/2009  DATE OF DISCHARGE:                               OPERATIVE REPORT   PREOPERATIVE DIAGNOSIS:  Urinary retention.   POSTOPERATIVE DIAGNOSIS:  Urinary retention.   PROCEDURE PERFORMED:  An 18-French coude catheter placement.   ANESTHESIA:  Lidocaine jelly transurethrally.   COMPLICATIONS:  None known.   ESTIMATED BLOOD LOSS:  None.   PROCEDURE:  Mr. Johansson was identified in his bed, room 5120 and the coude  that was not in the bladder was removed.  The penile glans was prepped  and draped in a sterile fashion.  The ureteral meatus was anesthetized  with the Urojet lidocaine jelly.  Several minutes were allowed to give  the jelly time to work.  The 18-French coude catheter was then placed  without any resistance.  There was no signs of trauma.  Over 500 mL of  pale yellow urine came out.  The patient did well throughout the  procedure.  The catheter should stay in place for at least 2-3 days  prior to voiding trial.      Almond Lint, MD  Electronically Signed     FB/MEDQ  D:  03/15/2009  T:  03/15/2009  Job:  [id number]

## 2011-02-24 NOTE — H&P (Signed)
NAMEKORDEL, LEAVY NO.:  [id number]   MEDICAL RECORD NO.:  [id number]            PATIENT TYPE:   LOCATION:                                 FACILITY:   PHYSICIAN:  Kerin Perna, M.D.       DATE OF BIRTH:   DATE OF ADMISSION:  03/01/2007  DATE OF DISCHARGE:                              HISTORY & PHYSICAL   CONSULTATION AND HISTORY AND PHYSICAL:   PHYSICIAN REQUESTING CONSULTATION:  Vesta Mixer, M.D.   PRIMARY CARE PHYSICIAN:  Ernestina Penna, M.D., [facility]   CHIEF COMPLAINT:  Chest pain.   HISTORY OF PRESENT ILLNESS:  I was asked to evaluate this 70 year old  white male patient for potential redo coronary bypass surgery for  recently diagnosed recurrent, severe coronary disease.  The patient had  coronary bypass grafting x2 in 1996 for unstable angina with disease of  the LAD and diagonal.  He underwent left IMA grafting to the LAD and  vein graft to the first diagonal.  He has done well until recently when  Class III angina was evaluated by Dr. Elease Hashimoto, and a cardiac  catheterization demonstrated a patent vein graft to the diagonal with  backflow to the LAD but a 70-80% stenosis of the LAD distal to the  takeoff of the diagonal native vessel.  The mammary artery was atretic.  LVEF was 50%. He also had a 70-80% stenosis of a small to medium-size OM-  1.  The OM main vessel was dominant and without significant disease.  The right coronary was nondominant and small.  There was no evidence of  valvular insufficiency.  Because of his recurrent angina and the  coronary anatomy defined by his cardiac catheterization, he is felt to  be a candidate for redo coronary bypass surgery.   PAST MEDICAL HISTORY:  1. Hypertension.  2. Hyperlipidemia.  3. Status post CABG in 1996   ALLERGIES:  No known drug allergies.   HOME MEDICATIONS:  1. Plavix, on hold x1 week.  2. Altace 5 mg daily.  3. Toprol XL 50 mg daily.  4. Aspirin 81 mg  b.i.d.  5. Lipitor 40 mg daily.  6. Niaspan 500 mg daily   SOCIAL HISTORY:  The patient does not smoking.  He does not drink.  He  is retired.  He is married. He enjoys gardening and yard work.   FAMILY HISTORY:  Noncontributory.   REVIEW OF SYSTEMS:  He denies weight loss or fever.  CARDIAC:  Review is  positive for Class III angina.  He denies any resting symptoms.  He  denies symptoms of CHF.  GI: Review is negative for blood per rectum,  hepatitis or jaundice.  Pulmonary:  Review is negative for recent  pulmonary infection or abnormal chest x-ray or chest trauma. His  previous sternal incision healed well.  Neurologic:  Review is positive  kidney stones.  He has had lithotripsy recently.  He is currently  without evidence of UTI or hematuria.  He is still passing some bits of  the stone  material.  VASCULAR:  Review is negative DVT or claudication  or TIA.  Vein was harvested from his right lower extremity with an open  incision.  NEUROLOGIC:  Review is positive headaches.  Negative for  seizure, stroke. HEMATOLOGIC:  Review is  negative for bleeding  disorder.  He is off Plavix for 1 week at this point   PHYSICAL EXAMINATION:  VITAL SIGNS:  The patient is 5 feet 8 inches,  weighs 158 pounds.  Blood pressure is 118/70, pulse 60, respirations 18,  saturation 98% on room air.  GENERAL APPEARANCE:  Is that of an anxious but pleasant middle-age male  in no distress.  HEENT: Exam is normocephalic.  Dentition good.  NECK:  Supple without JVD, mass or carotid bruit.  LYMPHATICS:  Reveal no palpable supraclavicular or cervical adenopathy.  LUNGS:  Breath sounds are clear and equal.  He has a well-healed sternal  incision.  CARDIAC:  Exam is regular without S3, gallop, or murmur.  ABDOMEN:  Exam is soft without pulsatile mass.  EXTREMITIES: Reveal no clubbing, cyanosis or edema.  VASCULAR:  Exam reveals 2+ pulses in all extremities.  The left radial  and ulnar pulses are palpable, and  he has a negative Allen's test by  vascular lab exam.  NEUROLOGIC:  Exam is intact.  SKIN:  There is a well-healed scar on the right leg above the ankle.   LABORATORY DATA:  I reviewed the coronary arteriograms and agree with  Dr. Harvie Bridge interpretation of significant recurrent coronary disease  with jeopardized distribution from the LAD stenosis and the OM-1  stenosis.   PLAN:  The patient will be a candidate for redo bypass surgery with  probable left radial artery graft (he is right-hand dominant) and  endoscopic harvest of the saphenous vein for conduit to regraft the LAD  and the OM-1.  If the vein graft to the diagonal is heavily diseased, it  will be replaced as well.  I reviewed the indications, benefits and  details of surgery with the patient and family including alternatives  and the associated risks of MI, bleeding, CVA, infection, and death.  He  understands and is in agreement to proceed with surgery scheduled for  Tuesday, May 1927.      Kerin Perna, M.D.  Electronically Signed     PV/MEDQ  D:  03/01/2007  T:  03/01/2007  Job:  [id number]   cc:   Vesta Mixer, M.D.  Ernestina Penna, M.D.

## 2011-02-24 NOTE — H&P (Signed)
Andrew [name], Andrew [name] NO.:  [id number]   MEDICAL RECORD NO.:  [id number]           PATIENT TYPE:   LOCATION:                                 FACILITY:   PHYSICIAN:  Vesta Mixer, M.D. DATE OF BIRTH:  1941/02/27   DATE OF ADMISSION:  02/14/2007  DATE OF DISCHARGE:                              HISTORY & PHYSICAL   CHIEF COMPLAINT:  Andrew [name] is an elderly gentleman with a history of  coronary artery disease.  He is status post coronary artery bypass  grafting.  He presents with some episodes of chest pain and is now  scheduled for heart catheterization.   HISTORY OF PRESENT ILLNESS:  Andrew [name] has had coronary artery disease for  many years.  He had coronary artery bypass grafting with left internal  mammary artery to the LAD as well as a saphenous vein graft to the  second diagonal artery.  His last heart catheterization in March 2006  revealed that his left internal mammary artery had become atretic.  His  LAD received flow via the saphenous vein graft to the diagonal artery  which then fed the LAD and down the LAD system.  He was noted to have a  small obtuse marginal branch at that time.  This branch was perhaps 1.75  to 2 mm in diameter.  There was a 90% stenosis at its mid point.  We  have been treating this medically, and he has overall done well.  He has  not had any particular problems.  He has been able to work out on his  treadmill without any significant episodes of chest pain.   He was seen in the office about a month ago for some episodes of  discomfort.  He thought that it was perhaps related to his lithotripsy  for kidney stones.  We increased his Toprol to 50 mg a day and he has  done well.   Yesterday, he ate several hot dogs for lunch.  Several hours later, he  developed some chest discomfort.  He took several nitroglycerin with  relief of the chest pain.  This morning, he worked out again on his  treadmill without any chest pain.  He  presents to the office today for  further evaluation.   Andrew [name] is not having any current chest pains now.  These pains are  described as a tightness and are in the center of his chest.  There is  no radiation, no diaphoresis and no dyspnea.   CURRENT MEDICATIONS:  1. Plavix 75 mg daily.  2. Aspirin 81 mg daily.  3. Zocor 10 mg daily.  4. Altace 5 mg daily.  5. Toprol XL 50 mg daily.  6. Zolpidem 10 mg daily.  7. Sulfamethoxazole once a day.  8. Niaspan 50 mg daily.   ALLERGIES:  None.   PAST MEDICAL HISTORY:  1. Coronary artery disease - status post coronary bypass grafting.  2. Hyperlipidemia.   SOCIAL HISTORY:  The patient is retired.  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  He does not smoke or  drink.   FAMILY HISTORY:  Father had history of heart disease but at an advanced  age.   REVIEW OF SYSTEMS:  His review of systems is reviewed and is essentially  negative except as noted in HPI.  He denies any cough or sputum  production.  He denies any syncope or pre-syncope or easy bruisability.  He denies any GU or GI problems.   PHYSICAL EXAMINATION:  GENERAL:  On examination, he is a elderly  gentleman in no acute distress.  He is alert and oriented x3 and his  mood and affect are normal.  VITAL SIGNS:  His weight is 158, blood pressure 116/68, with heart rate  of 64.  HEENT:  Exam reveals 2+ carotids.  No bruits, no JVD, no thyromegaly.  LUNGS:  Clear to auscultation.  HEART:  Regular rate, S1-S2.  ABDOMEN:  Abdominal exam reveals good bowel sounds and is nontender.  EXTREMITIES:  He has no clubbing, cyanosis or edema.  NEUROLOGICAL:  Neuro exam is nonfocal.   His EKG reveals sinus bradycardia.  There is no ST or T-wave changes.   Andrew [name] presents with another episode of chest pain.  I think that we  should proceed with heart catheterization.  We will schedule this for  Monday.  He is currently stable and does not have any EKG changes.  I  have asked him to come back  right away if he has a further episodes of  chest discomfort.  We have discussed the risks, benefits and options of  heart catheterization.  He understands and agrees to proceed.           ______________________________  Vesta Mixer, M.D.     PJN/MEDQ  D:  02/10/2007  T:  02/10/2007  Job:  [id number]   cc:   Gordy Savers, MD

## 2011-02-27 NOTE — Procedures (Signed)
[facility]  Patient:    Andrew [name], Andrew [name]                        MRN: [id number] Proc. Date: 06/16/00 Adm. Date:  [id number] Attending:  Deneen Harts CC:         Dr. Dorothey Baseman   Procedure Report  PROCEDURE:  Colonoscopy.  SURGEON:  Griffith Citron, M.D.  INDICATIONS:  A 70 year old white male without prior colorectal neoplasia surveillance who presents with a history of Hemoccult positive stool six months ago.  No family history of colorectal neoplasia.  The patient does note intermittent hematochezia.  DESCRIPTION OF PROCEDURE:  After reviewing the nature of the procedure with the patient including potential risks and complications, and after discussing alternative methods of diagnosis and treatment, informed consent was signed.  The patient was premedicated receiving Versed 10 mg, fentanyl 100 mcg administered IV in divided doses prior to and during the course of the procedure.  Using an Olympus pediatric PCF-140L pediatric video colonoscope, the rectum was intubated after digital examination revealed no evidence of perianal or intrarectal pathology.  The prostate was normal in size and smooth without nodularity.  The scope was inserted and advanced with some difficulty through a tortuous sigmoid colon.  Ultimately, the right colon was intubated with rotating the patient into a right lateral decubitus position.  The patient tolerated the intubation with moderate discomfort, but without complication. Preparation was excellent throughout.  The scope was slowly withdrawn after identification of the appendiceal orifice and the ileocecal valve.  The entire colon was normal except for a mild diverticular change in the sigmoid region.  Retroflexion in the rectal vault revealed internal hemorrhoids.  These are probably the source of the patients Hemoccult positive stool and hematochezia.  There was no evidence of neoplasia, mucosal  inflammation, or vascular lesion.  The colon was decompressed and the scope withdrawn.  The patient tolerated the procedure without complication.  He was maintained on low flow oxygen and datascope monitor throughout.  Time 2, technical 2, preparation 1, and total score equalled 5.  IMPRESSION: 1. Diverticulosis - sigmoid, mild. 2. Internal hemorrhoids - probable source of hematochezia and Hemoccult    positive stool.  No evidence for colorectal neoplasia.  RECOMMENDATIONS: 1. Anusol HC suppository one PR b.i.d. for one week p.r.n. 2. Repeat colonoscopy in 10 years. 3. Annual Hemoccult tests per Dr. Faustino Congress. DD:  06/16/00 TD:  06/17/00 Job: [id number]

## 2011-03-16 ENCOUNTER — Ambulatory Visit (AMBULATORY_SURGERY_CENTER): Payer: Medicare HMO | Admitting: *Deleted

## 2011-03-16 VITALS — Ht 69.0 in | Wt 165.0 lb

## 2011-03-16 DIAGNOSIS — Z1211 Encounter for screening for malignant neoplasm of colon: Secondary | ICD-10-CM

## 2011-03-16 DIAGNOSIS — K2270 Barrett's esophagus without dysplasia: Secondary | ICD-10-CM

## 2011-03-16 MED ORDER — PEG-KCL-NACL-NASULF-NA ASC-C 100 G PO SOLR: ORAL | Status: DC

## 2011-03-30 ENCOUNTER — Encounter: Payer: Self-pay | Admitting: Gastroenterology

## 2011-04-01 ENCOUNTER — Encounter: Payer: Self-pay | Admitting: Cardiovascular Disease

## 2011-04-07 ENCOUNTER — Other Ambulatory Visit: Payer: Self-pay | Admitting: *Deleted

## 2011-04-07 DIAGNOSIS — E785 Hyperlipidemia, unspecified: Secondary | ICD-10-CM

## 2011-04-08 ENCOUNTER — Encounter: Payer: Self-pay | Admitting: Cardiovascular Disease

## 2011-04-08 ENCOUNTER — Encounter: Payer: Medicare HMO | Admitting: Gastroenterology

## 2011-04-08 ENCOUNTER — Other Ambulatory Visit (INDEPENDENT_AMBULATORY_CARE_PROVIDER_SITE_OTHER): Payer: Medicare HMO | Admitting: *Deleted

## 2011-04-08 ENCOUNTER — Ambulatory Visit (INDEPENDENT_AMBULATORY_CARE_PROVIDER_SITE_OTHER): Payer: Medicare HMO | Admitting: Cardiovascular Disease

## 2011-04-08 VITALS — BP 110/60 | HR 66 | Ht 69.0 in | Wt 163.4 lb

## 2011-04-08 DIAGNOSIS — I2510 Atherosclerotic heart disease of native coronary artery without angina pectoris: Secondary | ICD-10-CM

## 2011-04-08 DIAGNOSIS — E785 Hyperlipidemia, unspecified: Secondary | ICD-10-CM

## 2011-04-08 LAB — BASIC METABOLIC PANEL
BUN: 21 mg/dL (ref 6–23)
CO2: 28 mEq/L (ref 19–32)
Calcium: 8.7 mg/dL (ref 8.4–10.5)
Chloride: 109 mEq/L (ref 96–112)
Creatinine, Ser: 0.8 mg/dL (ref 0.4–1.5)
GFR: 102.39 mL/min (ref >60.00)
Glucose, Bld: 119 mg/dL — ABNORMAL HIGH (ref 70–99)
Potassium: 4.1 mEq/L (ref 3.5–5.1)
Sodium: 141 mEq/L (ref 135–145)

## 2011-04-08 LAB — LIPID PANEL
Cholesterol: 112 mg/dL (ref 0–200)
HDL: 34.3 mg/dL — ABNORMAL LOW (ref >39.00)
LDL Cholesterol: 70 mg/dL (ref 0–99)
Total CHOL/HDL Ratio: 3
Triglycerides: 40 mg/dL (ref 0.0–149.0)
VLDL: 8 mg/dL (ref 0.0–40.0)

## 2011-04-08 LAB — HEPATIC FUNCTION PANEL
ALT: 18 U/L (ref 0–53)
AST: 20 U/L (ref 0–37)
Bilirubin, Direct: 0.1 mg/dL (ref 0.0–0.3)
Total Bilirubin: 0.6 mg/dL (ref 0.3–1.2)

## 2011-04-08 NOTE — Assessment & Plan Note (Signed)
Pt is doing very well.  He has not had any problems with angina.  Continue current medications.  OV and lipids, bmp, hfp in 6 months.

## 2011-04-08 NOTE — Patient Instructions (Signed)
May hold aspirin for 5 days prior to endoscopy.

## 2011-04-08 NOTE — Progress Notes (Signed)
[name] Date of Birth  1941-04-27 [facility] [address]     [address] [place], [place]  [address] [phone]  Fax  [phone]  History of Present Illness:  70 yo with  CAD and CABG.  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  Has a big garden.  Works without any angina, dyspnea or syncope.  Current Outpatient Prescriptions on File Prior to Visit  Medication Sig Dispense Refill  . aspirin 81 MG tablet Take 81 mg by mouth daily.        . Dutasteride-Tamsulosin HCl (JALYN) 0.5-0.4 MG CAPS Take by mouth daily.        . lansoprazole (PREVACID) 30 MG capsule Take 30 mg by mouth daily.        . metoprolol (TOPROL-XL) 50 MG 24 hr tablet Take by mouth daily. 1/2 tablet daily       . Multiple Vitamin (MULTIVITAMIN) tablet Take 1 tablet by mouth daily.        . niacin (NIASPAN) 500 MG CR tablet Take 500 mg by mouth at bedtime.        . nitroGLYCERIN (NITROSTAT) 0.4 MG SL tablet Place 0.4 mg under the tongue every 5 (five) minutes as needed.        . Omega-3 Fatty Acids (FISH OIL) 1000 MG CPDR Take 1,000 mg by mouth daily.        . simvastatin (ZOCOR) 10 MG tablet Take 10 mg by mouth at bedtime.        [name] DISCONTD: peg 3350 powder (MOVIPREP) 100 G SOLR MOVI PREP take as directed  1 kit  0    Allergies  Allergen Reactions  . Imdur (Isosorbide Mononitrate)   . Mobic     Facial swelling     Past Medical History  Diagnosis Date  . Allergy     grass etc.  . Myocardial infarction   . GERD (gastroesophageal reflux disease)     Barrett's  . Hyperlipidemia   . Hypertension   . Chronic kidney disease     kidney stones  . BPH (benign prostatic hyperplasia)   . Ulcer 1960  . Coronary artery disease   . Barrett's esophagus   . Chest pain, atypical     Past Surgical History  Procedure Date  . Coronary artery bypass graft     twice  2 by passes each time  . Cholecystectomy   . Colonoscopy   . Upper gastrointestinal endoscopy   .  Inguinal hernia repair     bilateral done twice  . Lithotripsy   . Cardiac catheterization 02/14/2007    MITRAL REGURGITATION. LV NORMAL    History  Smoking status  . Never Smoker   Smokeless tobacco  . Never Used    History  Alcohol Use No    Family History  Problem Relation Age of Onset  . Heart disease Father     Reviw of Systems:  Reviewed in the HPI.  All other systems are negative.  Physical Exam: BP 110/60  Pulse 66  Ht 5\' 9"  (1.753 m)  Wt 163 lb 6.4 oz (74.118 kg)  BMI 24.13 kg/m2 The patient is alert and oriented x 3.  The mood and affect are normal.   Skin: warm and dry.  Color is normal.    HEENT:   the sclera are nonicteric.  The mucous membranes are moist.  The carotids are 2+ without bruits.  There is no thyromegaly.  There is  no JVD.    Lungs: clear.  The chest wall is non tender.    Heart: regular rate with a normal S1 and S2.  There are no murmurs, gallops, or rubs. The PMI is not displaced.     Abdomin: good bowel sounds.  There is no guarding or rebound.  There is no hepatosplenomegaly or tenderness.  There are no masses.   Extremities:  no clubbing, cyanosis, or edema.  The legs are without rashes.  The distal pulses are intact.   Neuro:  Cranial nerves II - XII are intact.  Motor and sensory functions are intact.    The gait is normal.  ECG:  Assessment / Plan:

## 2011-04-25 ENCOUNTER — Telehealth: Payer: Self-pay | Admitting: Gastroenterology

## 2011-04-25 NOTE — Telephone Encounter (Signed)
On call note @ 1115. Pts wife had question about timing of MoviPrep for colonoscopy on Monday. Questions were answered.

## 2011-04-27 ENCOUNTER — Encounter: Payer: Self-pay | Admitting: Gastroenterology

## 2011-04-27 ENCOUNTER — Ambulatory Visit (AMBULATORY_SURGERY_CENTER): Payer: Medicare HMO | Admitting: Gastroenterology

## 2011-04-27 VITALS — BP 119/77 | HR 68 | Temp 97.5°F | Resp 20 | Ht 69.0 in | Wt 160.0 lb

## 2011-04-27 DIAGNOSIS — D126 Benign neoplasm of colon, unspecified: Secondary | ICD-10-CM

## 2011-04-27 DIAGNOSIS — K2970 Gastritis, unspecified, without bleeding: Secondary | ICD-10-CM

## 2011-04-27 DIAGNOSIS — K2990 Gastroduodenitis, unspecified, without bleeding: Secondary | ICD-10-CM

## 2011-04-27 DIAGNOSIS — K2270 Barrett's esophagus without dysplasia: Secondary | ICD-10-CM

## 2011-04-27 DIAGNOSIS — Z1211 Encounter for screening for malignant neoplasm of colon: Secondary | ICD-10-CM

## 2011-04-27 DIAGNOSIS — K219 Gastro-esophageal reflux disease without esophagitis: Secondary | ICD-10-CM

## 2011-04-27 DIAGNOSIS — K5730 Diverticulosis of large intestine without perforation or abscess without bleeding: Secondary | ICD-10-CM

## 2011-04-27 DIAGNOSIS — Z86010 Personal history of colonic polyps: Secondary | ICD-10-CM

## 2011-04-27 MED ORDER — SODIUM CHLORIDE 0.9 % IV SOLN: 500.0000 mL | INTRAVENOUS | Status: DC

## 2011-04-27 NOTE — Patient Instructions (Signed)
Please eat a diet that is high in fiber.  You may resume your medications as you would normally take them.

## 2011-04-27 NOTE — Progress Notes (Signed)
Pt tolerated the colon exam very well. MAW  Pt tolerated egd very well. MAW

## 2011-04-28 ENCOUNTER — Telehealth: Payer: Self-pay | Admitting: *Deleted

## 2011-04-28 DIAGNOSIS — K2970 Gastritis, unspecified, without bleeding: Secondary | ICD-10-CM

## 2011-04-28 DIAGNOSIS — K2990 Gastroduodenitis, unspecified, without bleeding: Secondary | ICD-10-CM

## 2011-04-28 NOTE — Telephone Encounter (Signed)

## 2011-05-04 ENCOUNTER — Encounter: Payer: Self-pay | Admitting: Gastroenterology

## 2011-07-30 LAB — CBC
MCHC: 34.8
MCV: 89.8
Platelets: 147 — ABNORMAL LOW
WBC: 4.7

## 2011-08-07 ENCOUNTER — Telehealth: Payer: Self-pay | Admitting: Cardiovascular Disease

## 2011-08-07 NOTE — Telephone Encounter (Signed)
Pt is having real bad back pain. He wants to know if it could be related to his heart please call back

## 2011-08-07 NOTE — Telephone Encounter (Signed)
Called stating he has been having off and on back pain today. 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 Denies SOB,CP. States he is not having any pain now. BP 2 days ago at doctor's office was 118/68. Has not tried Aleve or Advil or heating pad. Suggested he try those. Emphasized to him if pain returns he needs to go to urgent care or ER over weekend. He also was advised to call Mon AM and if still having some pain will have Lawson Fiscal see him. Has an app w/Dr. Elease Hashimoto on Tues.

## 2011-08-11 ENCOUNTER — Encounter: Payer: Self-pay | Admitting: Cardiovascular Disease

## 2011-08-11 ENCOUNTER — Ambulatory Visit (INDEPENDENT_AMBULATORY_CARE_PROVIDER_SITE_OTHER): Payer: Medicare HMO | Admitting: Cardiovascular Disease

## 2011-08-11 DIAGNOSIS — I2510 Atherosclerotic heart disease of native coronary artery without angina pectoris: Secondary | ICD-10-CM

## 2011-08-11 DIAGNOSIS — E785 Hyperlipidemia, unspecified: Secondary | ICD-10-CM

## 2011-08-11 MED ORDER — ATORVASTATIN CALCIUM 10 MG PO TABS: 10.0000 mg | ORAL_TABLET | Freq: Every day | ORAL | Status: DC

## 2011-08-11 NOTE — Assessment & Plan Note (Signed)
Andrew [name] seems to be doing well from a cardiac standpoint. He's not having episodes of chest pain or shortness breath. I've encouraged him to continue exercising as much as possible. His EKG is completely unchanged.  He's due for his fasting labs. I would like to change him from simvastatin to atorvastatin. Because of this change we will measure his fasting lab work in 3 months and again in 6 months when I see him again for office visit.

## 2011-08-11 NOTE — Assessment & Plan Note (Signed)
To like to have them on atorvastatin instead of simvastatin. We will prescribe him atorvastatin 10 mg a day. We'll check his fasting lab in 3 months.

## 2011-08-11 NOTE — Progress Notes (Signed)
Andrew [name] Date of Birth  June 21, 1941 [facility] [address]    [address] [place], [place]  [address] [phone]  Fax  [phone]  History of Present Illness:  Andrew [name] is a 70 year old gentleman with a history of coronary artery disease. He status post coronary artery bypass grafting. He also has a history of dyslipidemia.  He's recently been having some problems with back pain.  These episodes of back pain would typically occur when he is doing his normal household chores. It would typically occur in the middle of his back and radiate up through to the front of his chest and between the shoulder blades.  This was not similar to his previous episodes of angina prior to his bypass grafting.  He's been exercising on a regular basis and has not had these symptoms with exercise.   Current Outpatient Prescriptions on File Prior to Visit  Medication Sig Dispense Refill  . aspirin 81 MG tablet Take 81 mg by mouth daily.        . Dutasteride-Tamsulosin HCl (JALYN) 0.5-0.4 MG CAPS Take by mouth daily.        . lansoprazole (PREVACID) 30 MG capsule Take 30 mg by mouth daily.        . metoprolol (TOPROL-XL) 50 MG 24 hr tablet Take by mouth daily. 1/2 tablet daily       . Multiple Vitamin (MULTIVITAMIN) tablet Take 1 tablet by mouth daily.        . nitroGLYCERIN (NITROSTAT) 0.4 MG SL tablet Place 0.4 mg under the tongue every 5 (five) minutes as needed.        . Omega-3 Fatty Acids (FISH OIL) 1000 MG CPDR Take 1,000 mg by mouth daily.        . simvastatin (ZOCOR) 10 MG tablet Take 10 mg by mouth at bedtime.         Current Facility-Administered Medications on File Prior to Visit  Medication Dose Route Frequency Provider Last Rate Last Dose  . 0.9 %  sodium chloride infusion  500 mL Intravenous Continuous Sheryn Bison, MD        Allergies  Allergen Reactions  . Imdur (Isosorbide Mononitrate)   . Mobic     Facial swelling     Past Medical History  Diagnosis Date  . Allergy      grass etc.  . Myocardial infarction   . GERD (gastroesophageal reflux disease)     Barrett's  . Hyperlipidemia   . Hypertension   . Chronic kidney disease     kidney stones  . BPH (benign prostatic hyperplasia)   . Ulcer 1960  . Coronary artery disease   . Barrett's esophagus   . Chest pain, atypical     Past Surgical History  Procedure Date  . Coronary artery bypass graft     twice  2 by passes each time  . Cholecystectomy   . Colonoscopy   . Upper gastrointestinal endoscopy   . Inguinal hernia repair     bilateral done twice  . Lithotripsy   . Cardiac catheterization 02/14/2007    MITRAL REGURGITATION. LV NORMAL    History  Smoking status  . Never Smoker   Smokeless tobacco  . Never Used    History  Alcohol Use No    Family History  Problem Relation Age of Onset  . Heart disease Father     Reviw of Systems:  Reviewed in the HPI.  All other systems are negative.  Physical Exam: BP 114/64  Pulse 74  Ht 5\' 9"  (1.753 m)  Wt 166 lb 1.9 oz (75.352 kg)  BMI 24.53 kg/m2 The patient is alert and oriented x 3.  The mood and affect are normal.   Skin: warm and dry.  Color is normal.    HEENT:   Normal carotids. No JVD.  Lungs: Clear.   Heart: Regular rate. He has no murmurs.    Abdomen: His good bowel sounds. He is no hepatosplenomegaly.  Extremities:  No clubbing cyanosis or edema  Neuro:  Nonfocal. His gait is normal.     ECG: He is normal sinus rhythm. He has left atrial enlargement.  Assessment / Plan:

## 2011-08-11 NOTE — Patient Instructions (Signed)
Your physician wants you to follow-up in:6 months, You will receive a reminder letter in the mail two months in advance. If you don't receive a letter, please call our office to schedule the follow-up appointment.   Your physician recommends that you return for a FASTING lipid profile: 3 months and in 6 months  Your physician has recommended you make the following change in your medication:   1) stop aspirin for 5 days prior to prostate biopsy. You are at low cardiac risk for prostate biopsy. 2) stop simvastatin 3) start atorvastatin called to your pharmacy/ fasting lab follow up to check liver and kidneys

## 2011-11-09 ENCOUNTER — Telehealth: Payer: Self-pay | Admitting: Cardiovascular Disease

## 2011-11-09 NOTE — Telephone Encounter (Signed)
New problem Pt wants to know why he is scheduled for blood work on Wednesday please call him back

## 2011-11-09 NOTE — Telephone Encounter (Signed)
PATIENT WAS SUPPOSED TO START ATORVASTATIN AND STOP SIMVASTATIN AT HIS LAST OV WITH DR.NAHSER. PATIENT NEVER STARTED ATORVASTATIN AND IS CONTINUING TO TAKE SIMVASTATIN. I TOLD HIM THAT JODETTE WILL FOLLOW UP WITH HIM TOMORROW ABOUT WHETHER NOT HE SHOULD COME IN WED. FOR LAB WORK.

## 2011-11-11 ENCOUNTER — Other Ambulatory Visit (INDEPENDENT_AMBULATORY_CARE_PROVIDER_SITE_OTHER): Payer: Medicare HMO | Admitting: *Deleted

## 2011-11-11 DIAGNOSIS — I2510 Atherosclerotic heart disease of native coronary artery without angina pectoris: Secondary | ICD-10-CM

## 2011-11-11 DIAGNOSIS — E785 Hyperlipidemia, unspecified: Secondary | ICD-10-CM

## 2011-11-11 LAB — LIPID PANEL
Cholesterol: 100 mg/dL (ref 0–200)
Triglycerides: 40 mg/dL (ref 0.0–149.0)

## 2011-11-11 LAB — HEPATIC FUNCTION PANEL
AST: 21 U/L (ref 0–37)
Albumin: 3.9 g/dL (ref 3.5–5.2)
Alkaline Phosphatase: 49 U/L (ref 39–117)
Total Protein: 6.3 g/dL (ref 6.0–8.3)

## 2011-11-11 LAB — BASIC METABOLIC PANEL
Calcium: 8.6 mg/dL (ref 8.4–10.5)
Creatinine, Ser: 0.9 mg/dL (ref 0.4–1.5)
GFR: 86.83 mL/min (ref >60.00)

## 2012-11-25 ENCOUNTER — Encounter: Payer: Self-pay | Admitting: *Deleted

## 2012-12-22 ENCOUNTER — Encounter (HOSPITAL_COMMUNITY): Payer: Self-pay | Admitting: Dietician

## 2012-12-22 NOTE — Progress Notes (Signed)
[facility] Diabetes Class Completion  Date:December 22, 2012  Time: 5:30 PM  Pt attended [facility]'s Diabetes Group Education Class on December 22, 2012.   Patient was educated on the following topics: survival skills (signs and symptoms of hyperglycemia and hypoglycemia, treatment for hypoglycemia, ideal levels for fasting and postprandial blood sugars, goal Hgb A1c level, foot care basics), recommendations for physical activity, carbohydrate metabolism in relation to diabetes, and meal planning (sources of carbohydrate, carbohydrate counting, meal planning strategies, food label reading, and portion control).   Jenifer A. Kayan, RD, LDN   

## 2013-01-05 ENCOUNTER — Ambulatory Visit
Admission: RE | Admit: 2013-01-05 | Discharge: 2013-01-05 | Disposition: A | Discharge status: Home / Self Care | Payer: Medicare HMO | Source: Ambulatory Visit | Attending: Family Medicine | Admitting: Family Medicine

## 2013-01-05 ENCOUNTER — Encounter: Payer: Self-pay | Admitting: Family Medicine

## 2013-01-05 ENCOUNTER — Ambulatory Visit (INDEPENDENT_AMBULATORY_CARE_PROVIDER_SITE_OTHER): Payer: Medicare HMO | Admitting: Family Medicine

## 2013-01-05 VITALS — BP 120/68 | HR 76 | Temp 98.0°F | Resp 14 | Wt 166.0 lb

## 2013-01-05 DIAGNOSIS — M7702 Medial epicondylitis, left elbow: Secondary | ICD-10-CM

## 2013-01-05 DIAGNOSIS — I10 Essential (primary) hypertension: Secondary | ICD-10-CM

## 2013-01-05 DIAGNOSIS — R7309 Other abnormal glucose: Secondary | ICD-10-CM

## 2013-01-05 DIAGNOSIS — R7303 Prediabetes: Secondary | ICD-10-CM

## 2013-01-05 DIAGNOSIS — M7700 Medial epicondylitis, unspecified elbow: Secondary | ICD-10-CM

## 2013-01-05 DIAGNOSIS — E785 Hyperlipidemia, unspecified: Secondary | ICD-10-CM

## 2013-01-05 LAB — HEPATIC FUNCTION PANEL
ALT: 15 U/L (ref 0–53)
Bilirubin, Direct: 0.2 mg/dL (ref 0.0–0.3)
Indirect Bilirubin: 0.4 mg/dL (ref 0.0–0.9)
Total Bilirubin: 0.6 mg/dL (ref 0.3–1.2)

## 2013-01-05 LAB — LIPID PANEL
HDL: 34 mg/dL — ABNORMAL LOW (ref >39)
LDL Cholesterol: 59 mg/dL (ref 0–99)
Total CHOL/HDL Ratio: 3.1 Ratio
Triglycerides: 63 mg/dL (ref <150)
VLDL: 13 mg/dL (ref 0–40)

## 2013-01-05 LAB — BASIC METABOLIC PANEL
Chloride: 108 mEq/L (ref 96–112)
Creat: 0.85 mg/dL (ref 0.50–1.35)

## 2013-01-05 NOTE — Progress Notes (Signed)
Subjective:     Patient ID: Andrew [name], male   DOB: December 14, 1940, 72 y.o.   MRN: [id number]  HPI  Patient continues to have pain in his left elbow over the medial epicondyle.  This began in the fall of 2013 after he was charged by a bull and rammed against a barn wall.  He has not tried elbow strap we recommended. He has not been taking any NSAIDs. He's been trying rest. The pain is still acutely tender right over the medial epicondyle.  Is made worse repetitive use of the hands and muscles of the forearm.  He also has a history of prediabetes. His last A1c was 5.9. He has been trying a low carbohydrate diet. He denies any polyuria, polydipsia, or blurred vision.  He also has hypertension.  He sterilely on Toprol-XL 50 mg by mouth daily. He denies any chest pain, shortness of breath, or dyspnea on exertion.  He has hyperlipidemia he is currently on Zocor 10 mg by mouth daily. He denies any right upper quadrant pain or myalgias. Review of Systems    review of systems is otherwise negative Objective:   Physical Exam  Constitutional: He is oriented to person, place, and time. He appears well-developed and well-nourished.  HENT:  Head: Normocephalic.  Right Ear: External ear normal.  Left Ear: External ear normal.  Eyes: Conjunctivae are normal. Pupils are equal, round, and reactive to light.  Neck: Normal range of motion. Neck supple. No thyromegaly present.  Cardiovascular: Normal rate, regular rhythm and normal heart sounds.   No murmur heard. Pulmonary/Chest: Effort normal and breath sounds normal.  Abdominal: Soft. Bowel sounds are normal.  Musculoskeletal: He exhibits tenderness.  Neurological: He is alert and oriented to person, place, and time. He has normal reflexes.   his left medial epicondyles is exquisitely tender to the touch. There is no crepitus in the joint. I cannot feel any loose bodies about the joint. There is no effusion in the joint     Assessment:      Prediabetes - Plan: Basic Metabolic Panel, Hepatic Function Panel, Lipid Panel, Hemoglobin A1c  Essential hypertension, benign - Plan: Basic Metabolic Panel, Hepatic Function Panel, Lipid Panel, Hemoglobin A1c  Other and unspecified hyperlipidemia - Plan: Basic Metabolic Panel, Hepatic Function Panel, Lipid Panel, Hemoglobin A1c  Medial epicondylitis of elbow, left - Plan: DG Elbow Complete Left      Plan:    blood pressure is well controlled. Continue his present medications at the present dose.  His goal LDL is less than 100, pain fasting lipid panel and titrate simvastatin to achieve a goal LDL less than 100.  I will check an A1c. If his A1c is less than 6.5 he is to continue a low carbohydrate diet and recheck in 6 months.  If the x-ray of his elbow is negative, recommended a cortisone injection for medial epicondylitis versus orthopedic consult

## 2013-01-13 ENCOUNTER — Ambulatory Visit (INDEPENDENT_AMBULATORY_CARE_PROVIDER_SITE_OTHER): Payer: Medicare HMO | Admitting: Family Medicine

## 2013-01-13 ENCOUNTER — Encounter: Payer: Self-pay | Admitting: Family Medicine

## 2013-01-13 VITALS — BP 100/60 | HR 60 | Temp 98.2°F | Resp 14 | Wt 166.0 lb

## 2013-01-13 DIAGNOSIS — IMO0002 Reserved for concepts with insufficient information to code with codable children: Secondary | ICD-10-CM

## 2013-01-13 DIAGNOSIS — M7702 Medial epicondylitis, left elbow: Secondary | ICD-10-CM

## 2013-01-13 DIAGNOSIS — M7700 Medial epicondylitis, unspecified elbow: Secondary | ICD-10-CM

## 2013-01-13 DIAGNOSIS — S76312A Strain of muscle, fascia and tendon of the posterior muscle group at thigh level, left thigh, initial encounter: Secondary | ICD-10-CM

## 2013-01-13 NOTE — Progress Notes (Signed)
Subjective:     Patient ID: Andrew [name], male   DOB: 12-14-1940, 72 y.o.   MRN: [id number]  HPI   Patient continues to have pain in his left elbow over the medial epicondyle.  This began in the fall of 2013 after he was charged by a bull and rammed against a barn wall.  He has not tried elbow strap we recommended. He has not been taking any NSAIDs. He's been trying rest. The pain is still acutely tender right over the medial epicondyle.  Is made worse repetitive use of the hands and muscles of the forearm.  X-rays were obtained of the left elbow and were normal.  He returns today for cortisone injection for medial epicondylitis.    He also reports pain in his left hamstring after working all day yesterday. The pain is tight and sore. He has no decreased range of motion in his hip or his knee.  He did get a cramp in his hamstring last night. Review of Systems     review of systems is otherwise negative Objective:   Physical Exam  Constitutional: He is oriented to person, place, and time. He appears well-developed and well-nourished.  HENT:  Head: Normocephalic.  Right Ear: External ear normal.  Left Ear: External ear normal.  Eyes: Conjunctivae are normal. Pupils are equal, round, and reactive to light.  Neck: Normal range of motion. Neck supple. No thyromegaly present.  Cardiovascular: Normal rate, regular rhythm and normal heart sounds.   No murmur heard. Pulmonary/Chest: Effort normal and breath sounds normal.  Abdominal: Soft. Bowel sounds are normal.  Musculoskeletal: He exhibits tenderness.  Neurological: He is alert and oriented to person, place, and time. He has normal reflexes.   his left medial epicondyles is exquisitely tender to the touch. There is no crepitus in the joint. I cannot feel any loose bodies about the joint. There is no effusion in the joint    he has full range of motion in his left knee and left hip. He is mildly tender to palpation over the lateral distal  left hamstring Assessment:     Medial epicondylitis   hamstring strain Plan:    using sterile technique, a mixture of 1/2 cc 0.1% lidocaine and 1/2 cc of 40 mg per mL Kenalog was injected just distal to the left medial epicondyle without complication.  The patient tolerated the procedure well.  Recommended rest and recheck in 1 week. If pain persists would recommend a orthopedics consult.    I recommended tincture of time hamstring strain.

## 2013-04-12 ENCOUNTER — Ambulatory Visit (INDEPENDENT_AMBULATORY_CARE_PROVIDER_SITE_OTHER): Payer: Medicare HMO | Admitting: Physician Assistant

## 2013-04-12 ENCOUNTER — Encounter: Payer: Self-pay | Admitting: Physician Assistant

## 2013-04-12 VITALS — BP 116/60 | HR 68 | Temp 98.3°F | Resp 18 | Wt 159.0 lb

## 2013-04-12 DIAGNOSIS — M791 Myalgia, unspecified site: Secondary | ICD-10-CM

## 2013-04-12 DIAGNOSIS — IMO0001 Reserved for inherently not codable concepts without codable children: Secondary | ICD-10-CM

## 2013-04-12 DIAGNOSIS — W57XXXA Bitten or stung by nonvenomous insect and other nonvenomous arthropods, initial encounter: Secondary | ICD-10-CM

## 2013-04-12 DIAGNOSIS — R51 Headache: Secondary | ICD-10-CM

## 2013-04-12 MED ORDER — DOXYCYCLINE HYCLATE 100 MG PO TABS: 100.0000 mg | ORAL_TABLET | Freq: Two times a day (BID) | ORAL | Status: DC

## 2013-04-12 NOTE — Progress Notes (Signed)
Patient ID: CAMDEN KNOTEK MRN: [id number], DOB: [birth year], 72 y.o. Date of Encounter: 04/12/2013, 11:33 AM    Chief Complaint:  Chief Complaint  Patient presents with  . Tick Bite    Has had headaches, muscle pain and fatigue since removing it     HPI: 72 y.o. year old white male is here for evaluation for pssoible lyme/RMSF. His friend was just dx with RMSF so he is concerned. 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 Just pulled off a tic from his right lateral hip 2 days ago. He has been having neck pain and headache across forehead, temples. Has small area of erythema at site of tic but has had no other rash. Has had no fever and no malaise.   Home Meds: See attached medication section for any medications that were entered at today's visit. The computer does not put those onto this list.The following list is a list of meds entered prior to today's visit.   Current Outpatient Prescriptions on File Prior to Visit  Medication Sig Dispense Refill  . aspirin 81 MG tablet Take 81 mg by mouth daily.        . cholecalciferol (VITAMIN D) 1000 UNITS tablet Take 2,000 Units by mouth daily.      . Dutasteride-Tamsulosin HCl (JALYN) 0.5-0.4 MG CAPS Take by mouth daily.        . lansoprazole (PREVACID) 30 MG capsule Take 30 mg by mouth daily.        . metoprolol (TOPROL-XL) 50 MG 24 hr tablet Take by mouth daily. 1/2 tablet daily       . Multiple Vitamin (MULTIVITAMIN) tablet Take 1 tablet by mouth daily.        . nitroGLYCERIN (NITROSTAT) 0.4 MG SL tablet Place 0.4 mg under the tongue every 5 (five) minutes as needed.        . Omega-3 Fatty Acids (FISH OIL) 1000 MG CPDR Take 1,000 mg by mouth daily.        . simvastatin (ZOCOR) 10 MG tablet Take 10 mg by mouth daily.       Current Facility-Administered Medications on File Prior to Visit  Medication Dose Route Frequency Provider Last Rate Last Dose  . 0.9 %  sodium chloride infusion  500 mL Intravenous Continuous Mardella Layman, MD         Allergies:  Allergies  Allergen Reactions  . Imdur (Isosorbide Mononitrate)   . Meloxicam     Facial swelling       Review of Systems: See HPI for pertinent ROS. All other ROS negative.    Physical Exam: Blood pressure 116/60, pulse 68, temperature 98.3 F (36.8 C), temperature source Oral, resp. rate 18, weight 159 lb (72.122 kg)., Body mass index is 23.47 kg/(m^2). General: WNWD WM. Appears in no acute distress. Lungs: Clear bilaterally to auscultation without wheezes, rales, or rhonchi. Breathing is unlabored. Heart: Regular rhythm. No murmurs, rubs, or gallops. Msk:  Strength and tone normal for age. Extremities/Skin: Right lateral hip: 1/2 cm diameter of light pink erythema with 2 mm area in center c/w tic bite site. No other rash.  Neuro: Alert and oriented X 3. Moves all extremities spontaneously. Gait is normal. CNII-XII grossly in tact. Psych:  Responds to questions appropriately with a normal affect.     ASSESSMENT AND PLAN:  72 y.o. year old male with  1. Tick bite - doxycycline (VIBRA-TABS) 100 MG tablet; Take 1 tablet (100 mg total) by mouth 2 (two) times  daily.  Dispense: 28 tablet; Refill: 0  2. Headache(784.0) - doxycycline (VIBRA-TABS) 100 MG tablet; Take 1 tablet (100 mg total) by mouth 2 (two) times daily.  Dispense: 28 tablet; Refill: 0  3. Myalgia - doxycycline (VIBRA-TABS) 100 MG tablet; Take 1 tablet (100 mg total) by mouth 2 (two) times daily.  Dispense: 28 tablet; Refill: 0  Discussed labs/ titers for lyme/rmsf. After discussion of false negatives, he defers this. Will go ahead and start empiric treatment to cover for possible lyme/rmsf.  [address] [place], [place], [facility] 04/12/2013 11:33 AM

## 2013-06-15 ENCOUNTER — Encounter: Payer: Self-pay | Admitting: Cardiovascular Disease

## 2013-06-22 ENCOUNTER — Ambulatory Visit (INDEPENDENT_AMBULATORY_CARE_PROVIDER_SITE_OTHER): Payer: Medicare HMO | Admitting: Cardiovascular Disease

## 2013-06-22 ENCOUNTER — Encounter: Payer: Self-pay | Admitting: Cardiovascular Disease

## 2013-06-22 VITALS — BP 110/64 | HR 70 | Ht 69.0 in | Wt 158.0 lb

## 2013-06-22 DIAGNOSIS — I10 Essential (primary) hypertension: Secondary | ICD-10-CM

## 2013-06-22 DIAGNOSIS — I2510 Atherosclerotic heart disease of native coronary artery without angina pectoris: Secondary | ICD-10-CM

## 2013-06-22 MED ORDER — NITROGLYCERIN 0.4 MG SL SUBL: 0.4000 mg | SUBLINGUAL_TABLET | SUBLINGUAL | Status: DC | PRN

## 2013-06-22 NOTE — Assessment & Plan Note (Signed)
Andrew [name] continues To do well. He's not having episodes of angina. He remains very busy. We'll continue with the same medications. I'll see him again in one year for followup visit. We'll check an EKG and fasting lab work at that time.

## 2013-06-22 NOTE — Patient Instructions (Addendum)
Your physician recommends that you return for a FASTING lipid profile: 1 YEAR   Your physician wants you to follow-up in: 1 YEAR You will receive a reminder letter in the mail two months in advance. If you don't receive a letter, please call our office to schedule the follow-up appointment.

## 2013-06-22 NOTE — Progress Notes (Signed)
Andrew [name] Date of Birth  07/12/1941 [facility] [address]    [address] [place], [place]  [address] [phone]  Fax  [phone]  History of Present Illness:  Andrew [name] is a 72 year old gentleman with a history of coronary artery disease. He status post coronary artery bypass grafting. He also has a history of dyslipidemia.  He's recently been having some problems with back pain.  These episodes of back pain would typically occur when he is doing his normal household chores. It would typically occur in the middle of his back and radiate up through to the front of his chest and between the shoulder blades.  This was not similar to his previous episodes of angina prior to his bypass grafting.  He's been exercising on a regular basis and has not had these symptoms with exercise.   Sept. 11. 2014:  Andrew [name] is doing ok.  No angina.  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  No   Current Outpatient Prescriptions on File Prior to Visit  Medication Sig Dispense Refill  . aspirin 81 MG tablet Take 81 mg by mouth daily.        . Dutasteride-Tamsulosin HCl (JALYN) 0.5-0.4 MG CAPS Take by mouth daily.        . lansoprazole (PREVACID) 30 MG capsule Take 30 mg by mouth daily.        . metoprolol (TOPROL-XL) 50 MG 24 hr tablet Take by mouth daily. 1/2 tablet daily       . Multiple Vitamin (MULTIVITAMIN) tablet Take 1 tablet by mouth daily.        . nitroGLYCERIN (NITROSTAT) 0.4 MG SL tablet Place 0.4 mg under the tongue every 5 (five) minutes as needed.        . Omega-3 Fatty Acids (FISH OIL) 1000 MG CPDR Take 1,000 mg by mouth daily.        . simvastatin (ZOCOR) 10 MG tablet Take 10 mg by mouth daily.       No current facility-administered medications on file prior to visit.    Allergies  Allergen Reactions  . Imdur [Isosorbide Mononitrate]   . Meloxicam     Facial swelling     Past Medical History  Diagnosis Date  . Allergy     grass etc.  . Myocardial  infarction   . GERD (gastroesophageal reflux disease)     Barrett's  . Hyperlipidemia   . Hypertension   . Chronic kidney disease     kidney stones  . BPH (benign prostatic hyperplasia)   . Ulcer 1960  . Coronary artery disease   . Barrett's esophagus   . Chest pain, atypical   . Hernia     Past Surgical History  Procedure Laterality Date  . Coronary artery bypass graft      twice  2 by passes each time  . Cholecystectomy    . Colonoscopy    . Upper gastrointestinal endoscopy    . Inguinal hernia repair      bilateral done twice  . Lithotripsy    . Cardiac catheterization  02/14/2007    MITRAL REGURGITATION. LV NORMAL    History  Smoking status  . Never Smoker   Smokeless tobacco  . Never Used    History  Alcohol Use No    Family History  Problem Relation Age of Onset  . Heart disease Father     Reviw of Systems:  Reviewed in the HPI.  All other  systems are negative.  Physical Exam: BP 110/64  Pulse 70  Ht 5\' 9"  (1.753 m)  Wt 158 lb (71.668 kg)  BMI 23.32 kg/m2  SpO2 96% The patient is alert and oriented x 3.  The mood and affect are normal.   Skin: warm and dry.  Color is normal.    HEENT:   Normal carotids. No JVD.  Lungs: Clear.   Heart: Regular rate. He has no murmurs.    Abdomen: His good bowel sounds. He is no hepatosplenomegaly.  Extremities:  No clubbing cyanosis or edema  Neuro:  Nonfocal. His gait is normal.     ECG: Sept. 11, 2014:  NSR at 68. LAE, inc. RBBB.  Assessment / Plan:

## 2013-07-12 ENCOUNTER — Other Ambulatory Visit: Payer: Medicare HMO

## 2013-07-12 DIAGNOSIS — Z79899 Other long term (current) drug therapy: Secondary | ICD-10-CM

## 2013-07-12 DIAGNOSIS — E782 Mixed hyperlipidemia: Secondary | ICD-10-CM

## 2013-07-12 LAB — CBC WITH DIFFERENTIAL/PLATELET
Basophils Absolute: 0 10*3/uL (ref 0.0–0.1)
Eosinophils Relative: 1 % (ref 0–5)
HCT: 40.7 % (ref 39.0–52.0)
Hemoglobin: 14.1 g/dL (ref 13.0–17.0)
Lymphocytes Relative: 21 % (ref 12–46)
MCHC: 34.6 g/dL (ref 30.0–36.0)
MCV: 87.3 fL (ref 78.0–100.0)
Monocytes Absolute: 0.5 10*3/uL (ref 0.1–1.0)
Monocytes Relative: 7 % (ref 3–12)
RDW: 13 % (ref 11.5–15.5)

## 2013-07-12 LAB — COMPREHENSIVE METABOLIC PANEL
AST: 16 U/L (ref 0–37)
Albumin: 3.9 g/dL (ref 3.5–5.2)
BUN: 19 mg/dL (ref 6–23)
Calcium: 8.7 mg/dL (ref 8.4–10.5)
Chloride: 107 mEq/L (ref 96–112)
Creat: 0.9 mg/dL (ref 0.50–1.35)
Glucose, Bld: 112 mg/dL — ABNORMAL HIGH (ref 70–99)
Potassium: 4.6 mEq/L (ref 3.5–5.3)

## 2013-07-12 LAB — LIPID PANEL
Cholesterol: 98 mg/dL (ref 0–200)
HDL: 35 mg/dL — ABNORMAL LOW (ref >39)
Total CHOL/HDL Ratio: 2.8 Ratio
Triglycerides: 67 mg/dL (ref <150)

## 2013-07-24 ENCOUNTER — Encounter: Payer: Self-pay | Admitting: Family Medicine

## 2013-07-24 ENCOUNTER — Ambulatory Visit (INDEPENDENT_AMBULATORY_CARE_PROVIDER_SITE_OTHER): Payer: Medicare HMO | Admitting: Family Medicine

## 2013-07-24 VITALS — BP 130/68 | HR 62 | Temp 97.2°F | Resp 16 | Ht 69.0 in | Wt 163.0 lb

## 2013-07-24 DIAGNOSIS — R7309 Other abnormal glucose: Secondary | ICD-10-CM

## 2013-07-24 DIAGNOSIS — M545 Low back pain, unspecified: Secondary | ICD-10-CM

## 2013-07-24 DIAGNOSIS — Z23 Encounter for immunization: Secondary | ICD-10-CM

## 2013-07-24 DIAGNOSIS — R7303 Prediabetes: Secondary | ICD-10-CM

## 2013-07-24 DIAGNOSIS — E785 Hyperlipidemia, unspecified: Secondary | ICD-10-CM

## 2013-07-24 DIAGNOSIS — I10 Essential (primary) hypertension: Secondary | ICD-10-CM

## 2013-07-24 LAB — URINALYSIS, MICROSCOPIC ONLY
Casts: NONE SEEN
WBC, UA: NONE SEEN WBC/hpf (ref <3)

## 2013-07-24 LAB — URINALYSIS, ROUTINE W REFLEX MICROSCOPIC
Bilirubin Urine: NEGATIVE
Glucose, UA: NEGATIVE mg/dL
Ketones, ur: NEGATIVE mg/dL
Leukocytes, UA: NEGATIVE
Protein, ur: NEGATIVE mg/dL
pH: 5.5 (ref 5.0–8.0)

## 2013-07-24 MED ORDER — FLUTICASONE PROPIONATE 50 MCG/ACT NA SUSP: 2.0000 | Freq: Every day | NASAL | Status: DC

## 2013-07-24 NOTE — Progress Notes (Signed)
Subjective:    Patient ID: Andrew [name], male    DOB: [birth year], 72 y.o.   MRN: [id number]  HPI Patient is a 72 year old white male with a history of coronary artery disease, hypertension, hyperlipidemia who presents today to discuss his most recent lab work. He also has a history of prediabetes. His most recent fasting blood sugar was 112. He has been trying to decrease the carbs in his diet and exercise aerobically every day. He denies any chest pain, shortness of breath, dyspnea on exertion. He denies any myalgias or right upper quadrant pain. I reviewed the patient's medication list. His most recent lab work is listed below. He also complains of a lower back pain that occurs most mornings and improves as he gets up and moves around and limbers up. He denies any dysuria or hematuria. Office Visit on 07/24/2013  Component Date Value Range Status  . Color, Urine 07/24/2013 YELLOW  YELLOW Final  . APPearance 07/24/2013 CLEAR  CLEAR Final  . Specific Gravity, Urine 07/24/2013 1.020  1.005 - 1.030 Final  . pH 07/24/2013 5.5  5.0 - 8.0 Final  . Glucose, UA 07/24/2013 NEG  NEG mg/dL Final  . Bilirubin Urine 07/24/2013 NEG  NEG Final  . Ketones, ur 07/24/2013 NEG  NEG mg/dL Final  . Hgb urine dipstick 07/24/2013 SMALL* NEG Final  . Protein, ur 07/24/2013 NEG  NEG mg/dL Final  . Urobilinogen, UA 07/24/2013 0.2  0.0 - 1.0 mg/dL Final  . Nitrite 29/52/8413 NEG  NEG Final  . Leukocytes, UA 07/24/2013 NEG  NEG Final  . Squamous Epithelial / LPF 07/24/2013 NONE SEEN  RARE Final  . Crystals 07/24/2013 NONE SEEN  NONE SEEN Final  . Casts 07/24/2013 NONE SEEN  NONE SEEN Final  . WBC, UA 07/24/2013 NONE SEEN  <3 WBC/hpf Final  . RBC / HPF 07/24/2013 0-2  <3 RBC/hpf Final  . Bacteria, UA 07/24/2013 RARE  RARE Final  Lab on 07/12/2013  Component Date Value Range Status  . WBC 07/12/2013 6.8  4.0 - 10.5 K/uL Final  . RBC 07/12/2013 4.66  4.22 - 5.81 MIL/uL Final  . Hemoglobin 07/12/2013 14.1  13.0 -  17.0 g/dL Final  . HCT 24/40/1027 40.7  39.0 - 52.0 % Final  . MCV 07/12/2013 87.3  78.0 - 100.0 fL Final  . MCH 07/12/2013 30.3  26.0 - 34.0 pg Final  . MCHC 07/12/2013 34.6  30.0 - 36.0 g/dL Final  . RDW 25/36/6440 13.0  11.5 - 15.5 % Final  . Platelets 07/12/2013 131* 150 - 400 K/uL Final  . Neutrophils Relative % 07/12/2013 71  43 - 77 % Final  . Neutro Abs 07/12/2013 4.8  1.7 - 7.7 K/uL Final  . Lymphocytes Relative 07/12/2013 21  12 - 46 % Final  . Lymphs Abs 07/12/2013 1.4  0.7 - 4.0 K/uL Final  . Monocytes Relative 07/12/2013 7  3 - 12 % Final  . Monocytes Absolute 07/12/2013 0.5  0.1 - 1.0 K/uL Final  . Eosinophils Relative 07/12/2013 1  0 - 5 % Final  . Eosinophils Absolute 07/12/2013 0.1  0.0 - 0.7 K/uL Final  . Basophils Relative 07/12/2013 0  0 - 1 % Final  . Basophils Absolute 07/12/2013 0.0  0.0 - 0.1 K/uL Final  . Smear Review 07/12/2013 Criteria for review not met   Final  . Sodium 07/12/2013 141  135 - 145 mEq/L Final  . Potassium 07/12/2013 4.6  3.5 - 5.3 mEq/L Final  . Chloride  07/12/2013 107  96 - 112 mEq/L Final  . CO2 07/12/2013 28  19 - 32 mEq/L Final  . Glucose, Bld 07/12/2013 112* 70 - 99 mg/dL Final  . BUN 62/95/2841 19  6 - 23 mg/dL Final  . Creat 32/44/0102 0.90  0.50 - 1.35 mg/dL Final  . Total Bilirubin 07/12/2013 0.5  0.3 - 1.2 mg/dL Final  . Alkaline Phosphatase 07/12/2013 53  39 - 117 U/L Final  . AST 07/12/2013 16  0 - 37 U/L Final  . ALT 07/12/2013 15  0 - 53 U/L Final  . Total Protein 07/12/2013 5.8* 6.0 - 8.3 g/dL Final  . Albumin 72/53/6644 3.9  3.5 - 5.2 g/dL Final  . Calcium 03/47/4259 8.7  8.4 - 10.5 mg/dL Final  . Cholesterol 56/38/7564 98  0 - 200 mg/dL Final   Comment: ATP III Classification:                                < 200        mg/dL        Desirable                               200 - 239     mg/dL        Borderline High                               >= 240        mg/dL        High                             . Triglycerides  07/12/2013 67  <150 mg/dL Final  . HDL 33/29/5188 35* >39 mg/dL Final  . Total CHOL/HDL Ratio 07/12/2013 2.8   Final  . VLDL 07/12/2013 13  0 - 40 mg/dL Final  . LDL Cholesterol 07/12/2013 50  0 - 99 mg/dL Final   Comment:                            Total Cholesterol/HDL Ratio:CHD Risk                                                 Coronary Heart Disease Risk Table                                                                 Men       Women                                   1/2 Average Risk              3.4        3.3  Average Risk              5.0        4.4                                    2X Average Risk              9.6        7.1                                    3X Average Risk             23.4       11.0                          Use the calculated Patient Ratio above and the CHD Risk table                           to determine the patient's CHD Risk.                          ATP III Classification (LDL):                                < 100        mg/dL         Optimal                               100 - 129     mg/dL         Near or Above Optimal                               130 - 159     mg/dL         Borderline High                               160 - 189     mg/dL         High                                > 190        mg/dL         Very High                              Past Medical History  Diagnosis Date  . Allergy     grass etc.  . Myocardial infarction   . GERD (gastroesophageal reflux disease)     Barrett's  . Hyperlipidemia   . Hypertension   . Chronic kidney disease     kidney stones  . BPH (benign prostatic hyperplasia)   . Ulcer 1960  . Coronary artery disease   . Barrett's esophagus   . Chest pain, atypical   . Hernia    Current Outpatient  Prescriptions on File Prior to Visit  Medication Sig Dispense Refill  . aspirin 81 MG tablet Take 81 mg by mouth daily.        . Cholecalciferol (VITAMIN D3 PO) Take  2,000 Units by mouth daily.      . Dutasteride-Tamsulosin HCl (JALYN) 0.5-0.4 MG CAPS Take by mouth daily.        . lansoprazole (PREVACID) 30 MG capsule Take 30 mg by mouth daily.        . metoprolol (TOPROL-XL) 50 MG 24 hr tablet Take by mouth daily. 1/2 tablet daily       . Multiple Vitamin (MULTIVITAMIN) tablet Take 1 tablet by mouth daily.        . nitroGLYCERIN (NITROSTAT) 0.4 MG SL tablet Place 1 tablet (0.4 mg total) under the tongue every 5 (five) minutes as needed.  25 tablet  5  . Omega-3 Fatty Acids (FISH OIL) 1000 MG CPDR Take 1,000 mg by mouth daily.        . simvastatin (ZOCOR) 10 MG tablet Take 10 mg by mouth daily.       No current facility-administered medications on file prior to visit.   Allergies  Allergen Reactions  . Imdur [Isosorbide Mononitrate]   . Meloxicam     Facial swelling    History   Social History  . Marital Status: Married    Spouse Name: N/A    Number of Children: N/A  . Years of Education: N/A   Occupational History  . Not on file.   Social History Main Topics  . Smoking status: Never Smoker   . Smokeless tobacco: Never Used  . Alcohol Use: No  . Drug Use: No  . Sexual Activity: Not on file   Other Topics Concern  . Not on file   Social History Narrative  . No narrative on file    Review of Systems  All other systems reviewed and are negative.       Objective:   Physical Exam  Vitals reviewed. Neck: Neck supple. No thyromegaly present.  Cardiovascular: Normal rate, regular rhythm, normal heart sounds and intact distal pulses.  Exam reveals no gallop and no friction rub.   No murmur heard. Pulmonary/Chest: Effort normal and breath sounds normal. No respiratory distress. He has no wheezes. He has no rales. He exhibits no tenderness.  Abdominal: Soft. Bowel sounds are normal. He exhibits no distension and no mass. There is no tenderness. There is no rebound and no guarding.  Musculoskeletal: He exhibits no edema.   Lymphadenopathy:    He has no cervical adenopathy.          Assessment & Plan:  1. Low back pain Urinalysis is benign. Patient that this likely a muscle strain. I would treat symptomatically only. - Urinalysis, Routine w reflex microscopic  2. Need for prophylactic vaccination and inoculation against influenza I updated the patient's flu vaccine. - Flu Vaccine QUAD 36+ mos IM  3. Prediabetes Recommended a low carbohydrate diet and daily aerobic exercise. Recheck in 6 months he  4. HLD (hyperlipidemia) LDL is at goal. HDL is low. Recommended increasing aerobic exercise to 30 minutes a day 5 days a week  5. HTN (hypertension) Pressure is acceptable. Continue current medications at the present dosages.

## 2013-08-01 ENCOUNTER — Telehealth: Payer: Self-pay | Admitting: Family Medicine

## 2013-08-01 ENCOUNTER — Other Ambulatory Visit: Payer: Self-pay | Admitting: Family Medicine

## 2013-08-01 MED ORDER — LANSOPRAZOLE 30 MG PO CPDR: 30.0000 mg | DELAYED_RELEASE_CAPSULE | Freq: Every day | ORAL | Status: DC

## 2013-08-01 MED ORDER — SIMVASTATIN 10 MG PO TABS: 10.0000 mg | ORAL_TABLET | Freq: Every day | ORAL | Status: DC

## 2013-08-01 MED ORDER — METOPROLOL SUCCINATE ER 50 MG PO TB24: 50.0000 mg | ORAL_TABLET | Freq: Every day | ORAL | Status: DC

## 2013-08-01 NOTE — Addendum Note (Signed)
Addended by: Donne Anon on: 08/01/2013 02:45 PM   Modules accepted: Orders

## 2013-08-01 NOTE — Telephone Encounter (Signed)
Done

## 2013-08-01 NOTE — Telephone Encounter (Signed)
Should take 1 tab poqday

## 2013-08-01 NOTE — Telephone Encounter (Signed)
Message copied by Donne Anon on Tue Aug 01, 2013 10:31 AM ------      Message from: [facility], [place] Hose      Created: Tue Aug 01, 2013 10:03 AM      Regarding: Rx problem       He needs a 2wk supply called in locally for Toprol XL and Prevacid 30      Called in to [facility]            Then he needs new Rx's called in to mail order pharmacy for      Toprol XL       Prevacid 30 \      Simvastatin            Phone # [phone] ------

## 2013-08-01 NOTE — Telephone Encounter (Signed)
New RX for one tab daily sent to pharmacy

## 2013-08-01 NOTE — Telephone Encounter (Signed)
Pharmacy question is patient to take Toprol-XL 50mg   One tablet or 1/2 tablet daily??

## 2013-08-02 ENCOUNTER — Telehealth: Payer: Self-pay | Admitting: Family Medicine

## 2013-08-02 MED ORDER — LANSOPRAZOLE 30 MG PO CPDR: 30.0000 mg | DELAYED_RELEASE_CAPSULE | Freq: Every day | ORAL | Status: DC

## 2013-08-02 MED ORDER — SIMVASTATIN 10 MG PO TABS: 10.0000 mg | ORAL_TABLET | Freq: Every day | ORAL | Status: DC

## 2013-08-02 MED ORDER — METOPROLOL SUCCINATE ER 50 MG PO TB24: 50.0000 mg | ORAL_TABLET | Freq: Every day | ORAL | Status: DC

## 2013-08-02 NOTE — Telephone Encounter (Signed)
Needs his Toperol , Zocor, Prevacid  Refill .  [facility]. Needs ASAP

## 2013-08-02 NOTE — Telephone Encounter (Signed)
Meds refilled.

## 2013-08-08 ENCOUNTER — Telehealth: Payer: Self-pay | Admitting: Family Medicine

## 2013-08-08 MED ORDER — METOPROLOL SUCCINATE ER 25 MG PO TB24: 25.0000 mg | ORAL_TABLET | Freq: Every day | ORAL | Status: DC

## 2013-08-08 NOTE — Telephone Encounter (Signed)
rx refilled.

## 2013-08-30 ENCOUNTER — Telehealth: Payer: Self-pay | Admitting: Family Medicine

## 2013-08-30 MED ORDER — SIMVASTATIN 10 MG PO TABS: 10.0000 mg | ORAL_TABLET | Freq: Every day | ORAL | Status: DC

## 2013-08-30 NOTE — Telephone Encounter (Signed)
Rx Refilled  

## 2013-08-30 NOTE — Telephone Encounter (Signed)
Patient needs his ZOCOR refilled .   [facility]  [address]  [place]

## 2013-09-11 ENCOUNTER — Encounter: Payer: Self-pay | Admitting: Family Medicine

## 2013-09-11 ENCOUNTER — Ambulatory Visit (INDEPENDENT_AMBULATORY_CARE_PROVIDER_SITE_OTHER): Payer: Medicare HMO | Admitting: Family Medicine

## 2013-09-11 VITALS — BP 120/70 | HR 60 | Temp 97.2°F | Resp 16 | Ht 69.0 in | Wt 162.0 lb

## 2013-09-11 DIAGNOSIS — M545 Low back pain, unspecified: Secondary | ICD-10-CM

## 2013-09-11 LAB — URINALYSIS, ROUTINE W REFLEX MICROSCOPIC
Glucose, UA: NEGATIVE mg/dL
Leukocytes, UA: NEGATIVE
Nitrite: NEGATIVE
Specific Gravity, Urine: >1.03 — ABNORMAL HIGH (ref 1.005–1.030)
Urobilinogen, UA: 0.2 mg/dL (ref 0.0–1.0)
pH: 5.5 (ref 5.0–8.0)

## 2013-09-11 LAB — URINALYSIS, MICROSCOPIC ONLY
Casts: NONE SEEN
Squamous Epithelial / LPF: NONE SEEN

## 2013-09-11 MED ORDER — CYCLOBENZAPRINE HCL 10 MG PO TABS: 10.0000 mg | ORAL_TABLET | Freq: Three times a day (TID) | ORAL | Status: DC | PRN

## 2013-09-11 NOTE — Progress Notes (Signed)
Subjective:    Patient ID: Andrew [name], male    DOB: 1941-04-04, 72 y.o.   MRN: [id number]  HPI Patient has had pain in his right lower back the last week. He denies any specific injury. It is worse with twisting or movement. He denies any hematuria or dysuria. He denies any frequency or urgency. He denies any fever. He denies any falls or injuries. He states he may have strained his back trying to crank a chainsaw.  He denies any cough or chest pain or shortness of breath. The pain is located in the lower right flank.  There is no tenderness to palpation over the ribs. Past Medical History  Diagnosis Date  . Allergy     grass etc.  . Myocardial infarction   . GERD (gastroesophageal reflux disease)     Barrett's  . Hyperlipidemia   . Hypertension   . Chronic kidney disease     kidney stones  . BPH (benign prostatic hyperplasia)   . Ulcer 1960  . Coronary artery disease   . Barrett's esophagus   . Chest pain, atypical   . Hernia    Current Outpatient Prescriptions on File Prior to Visit  Medication Sig Dispense Refill  . aspirin 81 MG tablet Take 81 mg by mouth daily.        . Cholecalciferol (VITAMIN D3 PO) Take 2,000 Units by mouth daily.      . Dutasteride-Tamsulosin HCl (JALYN) 0.5-0.4 MG CAPS Take by mouth daily.        . fluticasone (FLONASE) 50 MCG/ACT nasal spray Place 2 sprays into the nose daily.  16 g  6  . lansoprazole (PREVACID) 30 MG capsule Take 1 capsule (30 mg total) by mouth daily.  90 capsule  1  . metoprolol succinate (TOPROL-XL) 25 MG 24 hr tablet Take 1 tablet (25 mg total) by mouth daily.  90 tablet  1  . Multiple Vitamin (MULTIVITAMIN) tablet Take 1 tablet by mouth daily.        . nitroGLYCERIN (NITROSTAT) 0.4 MG SL tablet Place 1 tablet (0.4 mg total) under the tongue every 5 (five) minutes as needed.  25 tablet  5  . Omega-3 Fatty Acids (FISH OIL) 1000 MG CPDR Take 1,000 mg by mouth daily.        . simvastatin (ZOCOR) 10 MG tablet Take 1 tablet (10 mg  total) by mouth at bedtime.  90 tablet  1   No current facility-administered medications on file prior to visit.   Allergies  Allergen Reactions  . Imdur [Isosorbide Mononitrate]   . Meloxicam     Facial swelling    History   Social History  . Marital Status: Married    Spouse Name: N/A    Number of Children: N/A  . Years of Education: N/A   Occupational History  . Not on file.   Social History Main Topics  . Smoking status: Never Smoker   . Smokeless tobacco: Never Used  . Alcohol Use: No  . Drug Use: No  . Sexual Activity: Not on file   Other Topics Concern  . Not on file   Social History Narrative  . No narrative on file      Review of Systems  All other systems reviewed and are negative.       Objective:   Physical Exam  Vitals reviewed. Constitutional: He appears well-developed and well-nourished. No distress.  Cardiovascular: Normal rate, regular rhythm and normal heart sounds.   No  murmur heard. Pulmonary/Chest: Breath sounds normal. No respiratory distress. He has no wheezes. He has no rales.  Musculoskeletal:       Thoracic back: He exhibits normal range of motion, no tenderness, no bony tenderness, no pain and no spasm.       Lumbar back: He exhibits normal range of motion, no tenderness, no bony tenderness, no pain and no spasm.  Skin: He is not diaphoretic.          Assessment & Plan:  1. Low back pain Urinalysis today is relatively benign. I do not feel this is a urinary tract infection. I suspect muscle strain and lower back. I recommended Flexeril 10 mg every 8 hours as needed for pain. I recommended tincture of time. He pain is no better in one week recommend obtaining a T-spine and L-spine x-ray. - cyclobenzaprine (FLEXERIL) 10 MG tablet; Take 1 tablet (10 mg total) by mouth 3 (three) times daily as needed for muscle spasms.  Dispense: 30 tablet; Refill: 0 - Urinalysis, Routine w reflex microscopic

## 2013-09-19 ENCOUNTER — Telehealth: Payer: Self-pay | Admitting: Family Medicine

## 2013-09-19 NOTE — Telephone Encounter (Signed)
Discontinue flexeril and use valium 5 mg poq 8 hrs prn muscle spasm.  Warn about sedation.

## 2013-09-19 NOTE — Telephone Encounter (Signed)
Has been using the Cyclobenzaprine for back pain.  Seems to be working some BUT is reacting with prostate medication. Now has great difficulty with urination.  Should he continue to use this?  Or what do you recommend?

## 2013-09-20 NOTE — Telephone Encounter (Signed)
Patient aware.

## 2013-10-27 ENCOUNTER — Encounter: Payer: Self-pay | Admitting: Family Medicine

## 2013-10-27 ENCOUNTER — Ambulatory Visit (INDEPENDENT_AMBULATORY_CARE_PROVIDER_SITE_OTHER): Payer: Medicare HMO | Admitting: Family Medicine

## 2013-10-27 VITALS — BP 118/60 | HR 60 | Temp 97.3°F | Resp 16 | Ht 69.0 in | Wt 163.0 lb

## 2013-10-27 DIAGNOSIS — J019 Acute sinusitis, unspecified: Secondary | ICD-10-CM

## 2013-10-27 MED ORDER — AMOXICILLIN 875 MG PO TABS: 875.0000 mg | ORAL_TABLET | Freq: Two times a day (BID) | ORAL | Status: DC

## 2013-10-27 NOTE — Progress Notes (Signed)
Subjective:    Patient ID: Andrew [name], male    DOB: Jul 18, 1941, 73 y.o.   MRN: [id number]  HPI Patient reports 2-3 days of diarrhea, purulent nasal discharge, sinus pressure, and head congestion. He denies any fever or sinus pain. He denies any sore throat or cough. Past Medical History  Diagnosis Date  . Allergy     grass etc.  . Myocardial infarction   . GERD (gastroesophageal reflux disease)     Barrett's  . Hyperlipidemia   . Hypertension   . Chronic kidney disease     kidney stones  . BPH (benign prostatic hyperplasia)   . Ulcer 1960  . Coronary artery disease   . Barrett's esophagus   . Chest pain, atypical   . Hernia    Current Outpatient Prescriptions on File Prior to Visit  Medication Sig Dispense Refill  . aspirin 81 MG tablet Take 81 mg by mouth daily.        . Cholecalciferol (VITAMIN D3 PO) Take 2,000 Units by mouth daily.      . cyclobenzaprine (FLEXERIL) 10 MG tablet Take 1 tablet (10 mg total) by mouth 3 (three) times daily as needed for muscle spasms.  30 tablet  0  . Dutasteride-Tamsulosin HCl (JALYN) 0.5-0.4 MG CAPS Take by mouth daily.        . fluticasone (FLONASE) 50 MCG/ACT nasal spray Place 2 sprays into the nose daily.  16 g  6  . lansoprazole (PREVACID) 30 MG capsule Take 1 capsule (30 mg total) by mouth daily.  90 capsule  1  . metoprolol succinate (TOPROL-XL) 25 MG 24 hr tablet Take 1 tablet (25 mg total) by mouth daily.  90 tablet  1  . Multiple Vitamin (MULTIVITAMIN) tablet Take 1 tablet by mouth daily.        . nitroGLYCERIN (NITROSTAT) 0.4 MG SL tablet Place 1 tablet (0.4 mg total) under the tongue every 5 (five) minutes as needed.  25 tablet  5  . Omega-3 Fatty Acids (FISH OIL) 1000 MG CPDR Take 1,000 mg by mouth daily.        . simvastatin (ZOCOR) 10 MG tablet Take 1 tablet (10 mg total) by mouth at bedtime.  90 tablet  1   No current facility-administered medications on file prior to visit.   Allergies  Allergen Reactions  . Imdur  [Isosorbide Mononitrate]   . Meloxicam     Facial swelling    History   Social History  . Marital Status: Married    Spouse Name: N/A    Number of Children: N/A  . Years of Education: N/A   Occupational History  . Not on file.   Social History Main Topics  . Smoking status: Never Smoker   . Smokeless tobacco: Never Used  . Alcohol Use: No  . Drug Use: No  . Sexual Activity: Not on file   Other Topics Concern  . Not on file   Social History Narrative  . No narrative on file      Review of Systems  All other systems reviewed and are negative.       Objective:   Physical Exam  Vitals reviewed. Constitutional: He appears well-developed and well-nourished. No distress.  HENT:  Right Ear: Tympanic membrane, external ear and ear canal normal.  Left Ear: Tympanic membrane, external ear and ear canal normal.  Nose: Mucosal edema and rhinorrhea present. Right sinus exhibits no maxillary sinus tenderness and no frontal sinus tenderness. Left sinus exhibits  no maxillary sinus tenderness and no frontal sinus tenderness.  Mouth/Throat: Oropharynx is clear and moist. No oropharyngeal exudate.  Neck: Neck supple.  Cardiovascular: Normal rate, regular rhythm and normal heart sounds.   No murmur heard. Pulmonary/Chest: Effort normal and breath sounds normal. No respiratory distress. He has no wheezes. He has no rales.  Lymphadenopathy:    He has no cervical adenopathy.  Skin: He is not diaphoretic.          Assessment & Plan:  1. Acute rhinosinusitis I explained to the patient that this is most likely a virus. Therefore I recommended tincture of time for a total of 7-10 days. Recommended symptomatic relief with Mucinex for congestion as well as Coricidin HBP.  I recommended nasal saline 4 times a day. I gave the patient prescription of amoxicillin 875 mg by mouth twice a day for 10 days but I also gave him strict instructions not to fill unless symptoms persist for greater  than 10 days or he develops high fever with purulent nasal discharge. The patient seemed satisfied with this explanation and this plan and stated that he would not get the abx unless those conditions were met - amoxicillin (AMOXIL) 875 MG tablet; Take 1 tablet (875 mg total) by mouth 2 (two) times daily.  Dispense: 20 tablet; Refill: 0

## 2013-11-20 ENCOUNTER — Telehealth: Payer: Self-pay | Admitting: Family Medicine

## 2013-11-20 MED ORDER — LANSOPRAZOLE 30 MG PO CPDR: 30.0000 mg | DELAYED_RELEASE_CAPSULE | Freq: Every day | ORAL | Status: DC

## 2013-11-20 NOTE — Telephone Encounter (Signed)
Med sent to [facility] in [place]

## 2013-11-20 NOTE — Telephone Encounter (Signed)
He needs A 14 day supply of Lansopracole 30mg s called in to local pharmacy- mail order late with meds

## 2013-11-22 ENCOUNTER — Ambulatory Visit (INDEPENDENT_AMBULATORY_CARE_PROVIDER_SITE_OTHER): Payer: Medicare HMO | Admitting: Family Medicine

## 2013-11-22 ENCOUNTER — Encounter: Payer: Self-pay | Admitting: Family Medicine

## 2013-11-22 VITALS — BP 120/68 | HR 80 | Temp 97.5°F | Resp 16 | Ht 69.0 in | Wt 164.0 lb

## 2013-11-22 DIAGNOSIS — J329 Chronic sinusitis, unspecified: Secondary | ICD-10-CM

## 2013-11-22 MED ORDER — CEFDINIR 300 MG PO CAPS: 300.0000 mg | ORAL_CAPSULE | Freq: Two times a day (BID) | ORAL | Status: DC

## 2013-11-22 MED ORDER — PREDNISONE 20 MG PO TABS: ORAL_TABLET | ORAL | Status: DC

## 2013-11-22 NOTE — Progress Notes (Signed)
Subjective:    Patient ID: Andrew [name], male    DOB: 23-Nov-1940, 73 y.o.   MRN: [id number]  HPI 10/27/13 Patient reports 2-3 days of diarrhea, purulent nasal discharge, sinus pressure, and head congestion. He denies any fever or sinus pain. He denies any sore throat or cough. At that time, my plan was: 1. Acute rhinosinusitis I explained to the patient that this is most likely a virus. Therefore I recommended tincture of time for a total of 7-10 days. Recommended symptomatic relief with Mucinex for congestion as well as Coricidin HBP.  I recommended nasal saline 4 times a day. I gave the patient prescription of amoxicillin 875 mg by mouth twice a day for 10 days but I also gave him strict instructions not to fill unless symptoms persist for greater than 10 days or he develops high fever with purulent nasal discharge. The patient seemed satisfied with this explanation and this plan and stated that he would not get the abx unless those conditions were met - amoxicillin (AMOXIL) 875 MG tablet; Take 1 tablet (875 mg total) by mouth 2 (two) times daily.  Dispense: 20 tablet; Refill: 0  11/22/13 Patient never got the amoxicillin. Over the last month he has developed chronic pain in his frontal sinuses. He has a chronic headache. Headache is described as a constant pressure. He has copious postnasal drip and rhinorrhea. He denies any fevers or chills. He denies any cough or shortness of breath. He does complain of pain in his teeth. Past Medical History  Diagnosis Date  . Allergy     grass etc.  . Myocardial infarction   . GERD (gastroesophageal reflux disease)     Barrett's  . Hyperlipidemia   . Hypertension   . Chronic kidney disease     kidney stones  . BPH (benign prostatic hyperplasia)   . Ulcer 1960  . Coronary artery disease   . Barrett's esophagus   . Chest pain, atypical   . Hernia    Current Outpatient Prescriptions on File Prior to Visit  Medication Sig Dispense Refill  .  amoxicillin (AMOXIL) 875 MG tablet Take 1 tablet (875 mg total) by mouth 2 (two) times daily.  20 tablet  0  . aspirin 81 MG tablet Take 81 mg by mouth daily.        . Cholecalciferol (VITAMIN D3 PO) Take 2,000 Units by mouth daily.      . cyclobenzaprine (FLEXERIL) 10 MG tablet Take 1 tablet (10 mg total) by mouth 3 (three) times daily as needed for muscle spasms.  30 tablet  0  . Dutasteride-Tamsulosin HCl (JALYN) 0.5-0.4 MG CAPS Take by mouth daily.        . fluticasone (FLONASE) 50 MCG/ACT nasal spray Place 2 sprays into the nose daily.  16 g  6  . lansoprazole (PREVACID) 30 MG capsule Take 1 capsule (30 mg total) by mouth daily.  14 capsule  1  . metoprolol succinate (TOPROL-XL) 25 MG 24 hr tablet Take 1 tablet (25 mg total) by mouth daily.  90 tablet  1  . Multiple Vitamin (MULTIVITAMIN) tablet Take 1 tablet by mouth daily.        . nitroGLYCERIN (NITROSTAT) 0.4 MG SL tablet Place 1 tablet (0.4 mg total) under the tongue every 5 (five) minutes as needed.  25 tablet  5  . Omega-3 Fatty Acids (FISH OIL) 1000 MG CPDR Take 1,000 mg by mouth daily.        . simvastatin (ZOCOR)  10 MG tablet Take 1 tablet (10 mg total) by mouth at bedtime.  90 tablet  1   No current facility-administered medications on file prior to visit.   Allergies  Allergen Reactions  . Imdur [Isosorbide Mononitrate]   . Meloxicam     Facial swelling    History   Social History  . Marital Status: Married    Spouse Name: N/A    Number of Children: N/A  . Years of Education: N/A   Occupational History  . Not on file.   Social History Main Topics  . Smoking status: Never Smoker   . Smokeless tobacco: Never Used  . Alcohol Use: No  . Drug Use: No  . Sexual Activity: Not on file   Other Topics Concern  . Not on file   Social History Narrative  . No narrative on file      Review of Systems  All other systems reviewed and are negative.       Objective:   Physical Exam  Vitals  reviewed. Constitutional: He appears well-developed and well-nourished. No distress.  HENT:  Right Ear: Tympanic membrane, external ear and ear canal normal.  Left Ear: Tympanic membrane, external ear and ear canal normal.  Nose: Mucosal edema and rhinorrhea present. Right sinus exhibits frontal sinus tenderness. Right sinus exhibits no maxillary sinus tenderness. Left sinus exhibits frontal sinus tenderness. Left sinus exhibits no maxillary sinus tenderness.  Mouth/Throat: Oropharynx is clear and moist. No oropharyngeal exudate.  Neck: Neck supple.  Cardiovascular: Normal rate, regular rhythm and normal heart sounds.   No murmur heard. Pulmonary/Chest: Effort normal and breath sounds normal. No respiratory distress. He has no wheezes. He has no rales.  Lymphadenopathy:    He has no cervical adenopathy.  Skin: He is not diaphoretic.          Assessment & Plan:  1. Chronic rhinosinusitis I believe the viral upper respiratory infection has developed into a chronic sinusitis. I recommended Omnicef 300 mg by mouth twice a day for 10 days and a prednisone taper pack. Recheck in 10 days if no better or sooner if worse. - predniSONE (DELTASONE) 20 MG tablet; 3 tabs poqday 1-2, 2 tabs poqday 3-4, 1 tab poqday 5-6  Dispense: 12 tablet; Refill: 0 - cefdinir (OMNICEF) 300 MG capsule; Take 1 capsule (300 mg total) by mouth 2 (two) times daily.  Dispense: 20 capsule; Refill: 0

## 2013-11-27 ENCOUNTER — Encounter: Payer: Self-pay | Admitting: Family Medicine

## 2013-11-27 ENCOUNTER — Ambulatory Visit (INDEPENDENT_AMBULATORY_CARE_PROVIDER_SITE_OTHER): Payer: Medicare HMO | Admitting: Family Medicine

## 2013-11-27 ENCOUNTER — Other Ambulatory Visit: Payer: Self-pay | Admitting: Family Medicine

## 2013-11-27 VITALS — BP 110/60 | HR 76 | Temp 97.1°F | Resp 18 | Ht 69.0 in | Wt 167.0 lb

## 2013-11-27 DIAGNOSIS — I208 Other forms of angina pectoris: Secondary | ICD-10-CM

## 2013-11-27 DIAGNOSIS — I209 Angina pectoris, unspecified: Secondary | ICD-10-CM

## 2013-11-27 MED ORDER — NITROGLYCERIN 0.4 MG SL SUBL: 0.4000 mg | SUBLINGUAL_TABLET | SUBLINGUAL | Status: DC | PRN

## 2013-11-27 NOTE — Progress Notes (Signed)
Subjective:    Patient ID: Andrew [name], male    DOB: May 19, 1941, 73 y.o.   MRN: [id number]  HPI Saturday evening, the patient became very anxious when he looked out of his window and saw several fire trucks at the end of his driveway. His heart began beating rapidly. He raced around his house and down the driveway to see what the emergency was. While this was taking place he developed a tightness in his chest that was 2-3/10. He took a few moments to rest and the chest pain went away. He currently is not having any chest pain. His chest pain did not require nitroglycerin. He denies any dyspnea on exertion and orthopnea. He exercises every day and does 30 minutes of weightlifting as well as 10-15 minutes on a treadmill with no difficulty. He does have a history of coronary artery disease. He has not required any nitroglycerin. He has NOT had any angina at rest.  He also recently has had symptoms of low back pain radiating into both legs to the level of the knee. He denies any weakness in the legs. He denies any saddle anesthesia or other symptoms of cauda equina syndrome. Has been taking prednisone recently the pain in his back has improved dramatically and he is no longer having pain in his legs. Past Medical History  Diagnosis Date  . Allergy     grass etc.  . Myocardial infarction   . GERD (gastroesophageal reflux disease)     Barrett's  . Hyperlipidemia   . Hypertension   . Chronic kidney disease     kidney stones  . BPH (benign prostatic hyperplasia)   . Ulcer 1960  . Coronary artery disease   . Barrett's esophagus   . Chest pain, atypical   . Hernia    Current Outpatient Prescriptions on File Prior to Visit  Medication Sig Dispense Refill  . aspirin 81 MG tablet Take 81 mg by mouth daily.        . Cholecalciferol (VITAMIN D3 PO) Take 2,000 Units by mouth daily.      . cyclobenzaprine (FLEXERIL) 10 MG tablet Take 1 tablet (10 mg total) by mouth 3 (three) times daily as needed  for muscle spasms.  30 tablet  0  . Dutasteride-Tamsulosin HCl (JALYN) 0.5-0.4 MG CAPS Take by mouth daily.        . fluticasone (FLONASE) 50 MCG/ACT nasal spray Place 2 sprays into the nose daily.  16 g  6  . lansoprazole (PREVACID) 30 MG capsule Take 1 capsule (30 mg total) by mouth daily.  14 capsule  1  . metoprolol succinate (TOPROL-XL) 25 MG 24 hr tablet Take 1 tablet (25 mg total) by mouth daily.  90 tablet  1  . Multiple Vitamin (MULTIVITAMIN) tablet Take 1 tablet by mouth daily.        . nitroGLYCERIN (NITROSTAT) 0.4 MG SL tablet Place 1 tablet (0.4 mg total) under the tongue every 5 (five) minutes as needed.  25 tablet  5  . Omega-3 Fatty Acids (FISH OIL) 1000 MG CPDR Take 1,000 mg by mouth daily.        . simvastatin (ZOCOR) 10 MG tablet Take 1 tablet (10 mg total) by mouth at bedtime.  90 tablet  1   No current facility-administered medications on file prior to visit.   Allergies  Allergen Reactions  . Imdur [Isosorbide Mononitrate]   . Meloxicam     Facial swelling    History   Social  History  . Marital Status: Married    Spouse Name: N/A    Number of Children: N/A  . Years of Education: N/A   Occupational History  . Not on file.   Social History Main Topics  . Smoking status: Never Smoker   . Smokeless tobacco: Never Used  . Alcohol Use: No  . Drug Use: No  . Sexual Activity: Not on file   Other Topics Concern  . Not on file   Social History Narrative  . No narrative on file      Review of Systems  All other systems reviewed and are negative.       Objective:   Physical Exam  Vitals reviewed. Constitutional: He is oriented to person, place, and time. He appears well-developed and well-nourished.  HENT:  Mouth/Throat: Oropharynx is clear and moist.  Eyes: Conjunctivae are normal. Right eye exhibits no discharge. Left eye exhibits no discharge. No scleral icterus.  Neck: Neck supple.  Cardiovascular: Normal rate, regular rhythm and normal heart  sounds.  Exam reveals no gallop and no friction rub.   No murmur heard. Pulmonary/Chest: Effort normal and breath sounds normal. No respiratory distress. He has no wheezes. He has no rales. He exhibits no tenderness.  Abdominal: Soft. Bowel sounds are normal. He exhibits no distension. There is no tenderness. There is no rebound and no guarding.  Musculoskeletal: He exhibits no edema.  Lymphadenopathy:    He has no cervical adenopathy.  Neurological: He is alert and oriented to person, place, and time. He has normal reflexes. He displays normal reflexes. No cranial nerve deficit. He exhibits abnormal muscle tone. Coordination normal.  Skin: Skin is warm. No rash noted. No erythema. No pallor.     EKG shows normal sinus rhythm at 69 beats per minute. There is an isolated Q wave in aVL. There is no other evidence of ischemia or infarction on his EKG. He does have left atrial enlargement.     Assessment & Plan:  1. Angina of effort The patient had stable angina related to activity and anxiety. He has nitroglycerin and understands when to use it. I do not believe this was a symptom of unstable angina. I explained to the patient that if he develops angina at rest he needs to go immediately to the emergency room. For the time being monitor this clinically.  Recommended that he discontinue ibuprofen.  I believe the symptoms in his back with spinal stenosis. The symptoms return I would obtain an MRI of the lumbar spine. The time being will monitor his symptoms clinically. - EKG 12-Lead

## 2014-02-02 ENCOUNTER — Telehealth: Payer: Self-pay | Admitting: Family Medicine

## 2014-02-02 NOTE — Telephone Encounter (Signed)
What can he take for his allergies that won't effect his HTN and prostate??

## 2014-02-05 ENCOUNTER — Ambulatory Visit (INDEPENDENT_AMBULATORY_CARE_PROVIDER_SITE_OTHER): Payer: Medicare HMO | Admitting: Family Medicine

## 2014-02-05 ENCOUNTER — Encounter: Payer: Self-pay | Admitting: Family Medicine

## 2014-02-05 ENCOUNTER — Ambulatory Visit
Admission: RE | Admit: 2014-02-05 | Discharge: 2014-02-05 | Disposition: A | Discharge status: Home / Self Care | Payer: Medicare HMO | Source: Ambulatory Visit | Attending: Family Medicine | Admitting: Family Medicine

## 2014-02-05 VITALS — BP 108/60 | HR 80 | Temp 97.2°F | Resp 16 | Ht 69.0 in | Wt 162.0 lb

## 2014-02-05 DIAGNOSIS — M545 Low back pain, unspecified: Secondary | ICD-10-CM

## 2014-02-05 DIAGNOSIS — I2510 Atherosclerotic heart disease of native coronary artery without angina pectoris: Secondary | ICD-10-CM

## 2014-02-05 DIAGNOSIS — Z9109 Other allergy status, other than to drugs and biological substances: Secondary | ICD-10-CM

## 2014-02-05 LAB — CBC WITH DIFFERENTIAL/PLATELET
BASOS ABS: 0.1 10*3/uL (ref 0.0–0.1)
Basophils Relative: 1 % (ref 0–1)
EOS PCT: 4 % (ref 0–5)
Eosinophils Absolute: 0.3 10*3/uL (ref 0.0–0.7)
HCT: 41.9 % (ref 39.0–52.0)
Hemoglobin: 14.6 g/dL (ref 13.0–17.0)
LYMPHS ABS: 1.2 10*3/uL (ref 0.7–4.0)
LYMPHS PCT: 19 % (ref 12–46)
MCH: 30 pg (ref 26.0–34.0)
MCHC: 34.8 g/dL (ref 30.0–36.0)
MCV: 86.2 fL (ref 78.0–100.0)
Monocytes Absolute: 0.6 10*3/uL (ref 0.1–1.0)
Monocytes Relative: 9 % (ref 3–12)
NEUTROS ABS: 4.3 10*3/uL (ref 1.7–7.7)
NEUTROS PCT: 67 % (ref 43–77)
PLATELETS: 143 10*3/uL — AB (ref 150–400)
RBC: 4.86 MIL/uL (ref 4.22–5.81)
RDW: 13.2 % (ref 11.5–15.5)
WBC: 6.4 10*3/uL (ref 4.0–10.5)

## 2014-02-05 LAB — COMPLETE METABOLIC PANEL WITH GFR
ALT: 15 U/L (ref 0–53)
AST: 17 U/L (ref 0–37)
Albumin: 4.1 g/dL (ref 3.5–5.2)
Alkaline Phosphatase: 55 U/L (ref 39–117)
BILIRUBIN TOTAL: 0.7 mg/dL (ref 0.2–1.2)
BUN: 18 mg/dL (ref 6–23)
CO2: 26 mEq/L (ref 19–32)
CREATININE: 0.82 mg/dL (ref 0.50–1.35)
Calcium: 8.9 mg/dL (ref 8.4–10.5)
Chloride: 104 mEq/L (ref 96–112)
GFR, Est Non African American: 87 mL/min
Glucose, Bld: 125 mg/dL — ABNORMAL HIGH (ref 70–99)
Potassium: 4.3 mEq/L (ref 3.5–5.3)
Sodium: 139 mEq/L (ref 135–145)
Total Protein: 5.8 g/dL — ABNORMAL LOW (ref 6.0–8.3)

## 2014-02-05 LAB — LIPID PANEL
Cholesterol: 114 mg/dL (ref 0–200)
HDL: 36 mg/dL — AB (ref >39)
LDL CALC: 64 mg/dL (ref 0–99)
Total CHOL/HDL Ratio: 3.2 Ratio
Triglycerides: 70 mg/dL (ref <150)
VLDL: 14 mg/dL (ref 0–40)

## 2014-02-05 MED ORDER — CICLESONIDE 50 MCG/ACT NA SUSP: 2.0000 | Freq: Two times a day (BID) | NASAL | Status: DC

## 2014-02-05 NOTE — Telephone Encounter (Signed)
I recommend flonase if he is not already on that.

## 2014-02-05 NOTE — Progress Notes (Signed)
Subjective:    Patient ID: Andrew [name], male    DOB: 11-29-1940, 73 y.o.   MRN: [id number]  HPI Patient is having severe seasonal allergies. However he is also has a history of coronary artery disease and BPH. Therefore his options are limited as to what he can take. He is tried Flonase in the past with very little benefit. He is extremely stop that today. He has a lot of clear rhinorrhea and sinus pressure. He is also here today for regular check of his hypertension hyperlipidemia. His history of coronary artery disease and therefore his goal LDL cholesterol is less than 70. His blood pressure is well-controlled today 108/60. He denies any chest pain shortness of breath dyspnea on exertion right upper quadrant pain or muscle pain. He continues to have dull low back pain. He's had this for several months. We have treated him for arthritis and for a pulled muscle with minimal benefit. He has not had x-rays of his back yet. Past Medical History  Diagnosis Date  . Allergy     grass etc.  . Myocardial infarction   . GERD (gastroesophageal reflux disease)     Barrett's  . Hyperlipidemia   . Hypertension   . Chronic kidney disease     kidney stones  . BPH (benign prostatic hyperplasia)   . Ulcer 1960  . Coronary artery disease   . Barrett's esophagus   . Chest pain, atypical   . Hernia    Current Outpatient Prescriptions on File Prior to Visit  Medication Sig Dispense Refill  . aspirin 81 MG tablet Take 81 mg by mouth daily.        . cefdinir (OMNICEF) 300 MG capsule Take 300 mg by mouth 2 (two) times daily.      . Cholecalciferol (VITAMIN D3 PO) Take 2,000 Units by mouth daily.      . cyclobenzaprine (FLEXERIL) 10 MG tablet Take 1 tablet (10 mg total) by mouth 3 (three) times daily as needed for muscle spasms.  30 tablet  0  . Dutasteride-Tamsulosin HCl (JALYN) 0.5-0.4 MG CAPS Take by mouth daily.        . fluticasone (FLONASE) 50 MCG/ACT nasal spray Place 2 sprays into the nose daily.   16 g  6  . ibuprofen (ADVIL,MOTRIN) 800 MG tablet Take 800 mg by mouth 2 (two) times daily.      . lansoprazole (PREVACID) 30 MG capsule Take 1 capsule (30 mg total) by mouth daily.  14 capsule  1  . metoprolol succinate (TOPROL-XL) 25 MG 24 hr tablet Take 1 tablet (25 mg total) by mouth daily.  90 tablet  1  . Multiple Vitamin (MULTIVITAMIN) tablet Take 1 tablet by mouth daily.        . nitroGLYCERIN (NITROSTAT) 0.4 MG SL tablet Place 1 tablet (0.4 mg total) under the tongue every 5 (five) minutes as needed.  25 tablet  5  . Omega-3 Fatty Acids (FISH OIL) 1000 MG CPDR Take 1,000 mg by mouth daily.        . simvastatin (ZOCOR) 10 MG tablet Take 1 tablet (10 mg total) by mouth at bedtime.  90 tablet  1   No current facility-administered medications on file prior to visit.   Allergies  Allergen Reactions  . Imdur [Isosorbide Mononitrate]   . Meloxicam     Facial swelling    History   Social History  . Marital Status: Married    Spouse Name: N/A    Number  of Children: N/A  . Years of Education: N/A   Occupational History  . Not on file.   Social History Main Topics  . Smoking status: Never Smoker   . Smokeless tobacco: Never Used  . Alcohol Use: No  . Drug Use: No  . Sexual Activity: Not on file   Other Topics Concern  . Not on file   Social History Narrative  . No narrative on file      Review of Systems  All other systems reviewed and are negative.      Objective:   Physical Exam  Vitals reviewed. Constitutional: He appears well-developed and well-nourished.  HENT:  Nose: Mucosal edema and rhinorrhea present.  Mouth/Throat: Oropharyngeal exudate present.  Cardiovascular: Normal rate, regular rhythm and normal heart sounds.   Pulmonary/Chest: Effort normal and breath sounds normal. No respiratory distress. He has no wheezes. He has no rales.  Abdominal: Soft. Bowel sounds are normal. He exhibits no distension. There is no tenderness. There is no rebound.    Musculoskeletal: He exhibits no edema.          Assessment & Plan:  1. Environmental allergies Use Afrin 2 sprays each nostril twice a day for 3 days to decrease the swelling and rhinorrhea so that the topical omnaris can reach the intended target area.  Discontinue Afrin after 3 days and then continued on meds indefinitely. - ciclesonide (OMNARIS) 50 MCG/ACT nasal spray; Place 2 sprays into both nostrils 2 (two) times daily.  Dispense: 12.5 g; Refill: 11  2. Low back pain I still believe is likely due to a combination of pulled muscles and degenerative disc disease. I will get an x-ray for further - DG Lumbar Spine Complete; Future  3. CAD (coronary artery disease) Blood pressures well controlled. Check fasting lipid panel.  Goal LDL is less than 70 - CBC with Differential - COMPLETE METABOLIC PANEL WITH GFR - Lipid panel

## 2014-02-05 NOTE — Telephone Encounter (Signed)
Pt came to see provider today

## 2014-02-07 ENCOUNTER — Telehealth: Payer: Self-pay | Admitting: Family Medicine

## 2014-02-07 DIAGNOSIS — M545 Low back pain, unspecified: Secondary | ICD-10-CM

## 2014-02-07 DIAGNOSIS — J309 Allergic rhinitis, unspecified: Secondary | ICD-10-CM

## 2014-02-07 NOTE — Telephone Encounter (Signed)
Pt made aware of lab results and xray results.  Would like to try some PT for back.  Referral initiated.  ALSO, was not able to get Omnaris at pharmacy.  They had none in stock because very seldom ordered.  Also was very expensive co-pay.  Is there some thing else he can use that will not effect his prostate?

## 2014-02-07 NOTE — Telephone Encounter (Signed)
Message copied by Olena Mater on Wed Feb 07, 2014  9:30 AM ------      Message from: Andrew [name]      Created: Tue Feb 06, 2014  7:19 AM       Patient has degenerative disc disease changes within the upper and mid lumbar      spine. No acute osseus abnormalities.  This is the likely cause of his low back pain.  Would he like to try PT? ------

## 2014-02-08 MED ORDER — MOMETASONE FUROATE 50 MCG/ACT NA SUSP: 2.0000 | Freq: Every day | NASAL | Status: DC

## 2014-02-08 NOTE — Telephone Encounter (Signed)
Switch to nasonex 2 sprays each nostril qday.

## 2014-02-08 NOTE — Telephone Encounter (Signed)
Patient not home.  Told wife new Rx for nasal spray has been sent to pharmacy

## 2014-02-22 ENCOUNTER — Telehealth: Payer: Self-pay | Admitting: *Deleted

## 2014-02-22 ENCOUNTER — Other Ambulatory Visit: Payer: Self-pay | Admitting: Family Medicine

## 2014-02-22 ENCOUNTER — Ambulatory Visit (HOSPITAL_COMMUNITY)
Admission: RE | Admit: 2014-02-22 | Discharge: 2014-02-22 | Disposition: A | Discharge status: Home / Self Care | Payer: Medicare HMO | Source: Ambulatory Visit | Attending: Family Medicine | Admitting: Family Medicine

## 2014-02-22 DIAGNOSIS — R293 Abnormal posture: Secondary | ICD-10-CM

## 2014-02-22 DIAGNOSIS — IMO0001 Reserved for inherently not codable concepts without codable children: Secondary | ICD-10-CM | POA: Insufficient documentation

## 2014-02-22 DIAGNOSIS — I10 Essential (primary) hypertension: Secondary | ICD-10-CM | POA: Insufficient documentation

## 2014-02-22 DIAGNOSIS — M545 Low back pain, unspecified: Secondary | ICD-10-CM | POA: Insufficient documentation

## 2014-02-22 DIAGNOSIS — M2560 Stiffness of unspecified joint, not elsewhere classified: Secondary | ICD-10-CM

## 2014-02-22 MED ORDER — LANSOPRAZOLE 30 MG PO CPDR: 30.0000 mg | DELAYED_RELEASE_CAPSULE | Freq: Every day | ORAL | Status: DC

## 2014-02-22 NOTE — Evaluation (Signed)
Physical Therapy Evaluation  Patient Details  Name: Andrew [name] MRN: [id number] Date of Birth: 1941/01/01  Today's Date: 02/22/2014 Time: 1015-1055 PT Time Calculation (min): 40 min Charge:  Evaluation.             Visit#: 1 of 4  Re-eval: 03/24/14 Assessment Diagnosis: LBP   Authorization: [employer] Person    Past Medical History:  Past Medical History  Diagnosis Date  . Allergy     grass etc.  . Myocardial infarction   . GERD (gastroesophageal reflux disease)     Barrett's  . Hyperlipidemia   . Hypertension   . Chronic kidney disease     kidney stones  . BPH (benign prostatic hyperplasia)   . Ulcer 1960  . Coronary artery disease   . Barrett's esophagus   . Chest pain, atypical   . Hernia    Past Surgical History:  Past Surgical History  Procedure Laterality Date  . Coronary artery bypass graft      twice  2 by passes each time  . Cholecystectomy    . Colonoscopy    . Upper gastrointestinal endoscopy    . Inguinal hernia repair      bilateral done twice  . Lithotripsy    . Cardiac catheterization  02/14/2007    MITRAL REGURGITATION. LV NORMAL    Subjective Symptoms/Limitations Symptoms: Mr. [name] states that he has been having back pain that is greatest when he gets up but occurs periodically  throughout the day.  It is always on his Right side never on his left.  He has been referred to PT to alleviate his pain. How long can you sit comfortably?: an hour How long can you stand comfortably?: no problem  How long can you walk comfortably?: walks on a TM for 15 minutes which helps it.   Pain Assessment Currently in Pain?: No/denies (Highest pain is a 4/10) Pain Location: Back Pain Orientation: Left Pain Type: Chronic pain Pain Onset: More than a month ago Pain Frequency: Intermittent   Balance Screening  no falls   Sensation/Coordination/Flexibility/Functional Tests Flexibility Thomas: Positive 90/90: Positive  Assessment RLE Strength Right Hip  Extension: 3+/5 LLE Strength Left Hip Extension: 3+/5 Lumbar AROM Lumbar Flexion: decreased 30% Lumbar Extension: decreased 80% Lumbar - Right Side Bend: decreased 50% Lumbar - Left Side Bend: decreased 30% Lumbar - Right Rotation: decreased 30% Lumbar - Left Rotation: decreased 40%  Exercise/Treatments Mobility/Balance  Posture/Postural Control Posture/Postural Control: Postural limitations Postural Limitations: forward head, increased kyphosis decreased lordosis   Stretches Active Hamstring Stretch: 3 reps;30 seconds Single Knee to Chest Stretch: 3 reps;30 seconds Lower Trunk Rotation: 3 reps;30 seconds Standing Extension: 5 reps   Supine Other Supine Lumbar Exercises: decompressive ex 1-5       Physical Therapy Assessment and Plan PT Assessment and Plan Clinical Impression Statement: Pt is a 73 yo active male who states he has had increased LBP for quite a while.   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  Exam demonstrates significant postural changes as well as tight mm.  Pt will benefit from skilled PT to decrease his sx of pain and improve his quality of life.  Rehab Potential: Good PT Frequency: Min 1X/week PT Duration: 4 weeks PT Treatment/Interventions: Therapeutic activities;Manual techniques PT Plan: Pt to recieive t-band decompressive ex, t-band postural exercises; begin standing UE flexion and body Dealer education.      Goals Home Exercise Program Pt/caregiver will Perform Home Exercise Program: For  increased ROM PT Goal: Perform Home Exercise Program - Progress: Goal set today PT Short Term Goals Time to Complete Short Term Goals: 2 weeks PT Short Term Goal 1: Pain no greater than a  3/10  PT Short Term Goal 2: Pt to verbalize the importance of posture in back care. PT Long Term Goals Time to Complete Long Term Goals: 4 weeks PT Long Term Goal 1: Pt I in advance HEP PT Long Term Goal 2: Pt to only have back pain 1-2 times a week Long Term  Goal 3: Pain to never go above a 2/10  Problem List Patient Active Problem List   Diagnosis Date Noted  . Poor posture 02/22/2014  . Stiffness of joints, not elsewhere classified, multiple sites 02/22/2014  . Prediabetes 01/05/2013  . Special screening for malignant neoplasms, colon 04/27/2011  . Benign neoplasm of colon 04/27/2011  . GERD (gastroesophageal reflux disease) 04/27/2011  . Diverticulosis of colon (without mention of hemorrhage) 04/27/2011  . CAD (coronary artery disease) 12/27/2010  . Environmental allergies 12/27/2010  . Insomnia 12/27/2010  . H. pylori infection 12/27/2010  . Barrett's esophagus 12/27/2010  . HYPERLIPIDEMIA 11/08/2008  . HYPERTENSION 11/08/2008  . GERD 11/08/2008  . ACUT PEPTC ULCR UNS SITE W/HEM W/O MENTION OBST 11/08/2008  . RENAL CALCULUS 11/08/2008  . Obstructive sleep apnea 04/12/2003    PT Plan of Care PT Home Exercise Plan: given for decompression 1-5 and stretching.  GP Functional Assessment Tool Used: clinical judgement Functional Limitation: Changing and maintaining body position Changing and Maintaining Body Position Current Status (I4580): At least 1 percent but less than 20 percent impaired, limited or restricted Changing and Maintaining Body Position Goal Status (D9833): 0 percent impaired, limited or restricted  [name] 02/22/2014, 12:16 PM  Physician Documentation Your signature is required to indicate approval of the treatment plan as stated above.  Please sign and either send electronically or make a copy of this report for your files and return this physician signed original.   Please mark one 1.__approve of plan  2. ___approve of plan with the following conditions.   ______________________________                                                          _____________________ Physician Signature                                                                                                             Date

## 2014-02-22 NOTE — Telephone Encounter (Signed)
Medication refilled

## 2014-03-01 ENCOUNTER — Ambulatory Visit (HOSPITAL_COMMUNITY)
Admission: RE | Admit: 2014-03-01 | Discharge: 2014-03-01 | Disposition: A | Discharge status: Home / Self Care | Payer: Medicare HMO | Source: Ambulatory Visit | Attending: Family Medicine | Admitting: Family Medicine

## 2014-03-01 NOTE — Progress Notes (Signed)
Physical Therapy Treatment Patient Details  Name: Andrew [name] MRN: [id number] Date of Birth: 16-Oct-1940  Today's Date: 03/01/2014 Time: 1025-1100 PT Time Calculation (min): 35 min Charges: Therex x 35'  Visit#: 2 of 4  Re-eval: 03/24/14  Authorization: [employer]are   Subjective: Symptoms/Limitations Symptoms: Pt states that he hasn't had much time to work on his exercises at home.  Pain Assessment Currently in Pain?: Yes Pain Score: 5  Pain Location: Back Pain Orientation: Lower;Right  Exercise/Treatments Stretches Active Hamstring Stretch: 3 reps;30 seconds Single Knee to Chest Stretch: 3 reps;30 seconds Lower Trunk Rotation: 5 reps;10 seconds Supine Ab Set: 10 reps Bridge: 10 reps Straight Leg Raise: 10 reps Other Supine Lumbar Exercises: decompressive ex 1-5 Sidelying Hip Abduction: 10 reps Prone  Straight Leg Raise: 10 reps  Physical Therapy Assessment and Plan PT Assessment and Plan Clinical Impression Statement: Pt completes therex well after initial cueing and demo. No exercises added to HEP this session as pt is not completing the exercises given to him last week regularly. Educated pt on the importance of HEP compliance for rehab outcomes. Pt verbalizes understanding. Pt reports pain decrease to 4/10 at end of session.  Rehab Potential: Good PT Frequency: Min 1X/week PT Duration: 4 weeks PT Treatment/Interventions: Therapeutic activities;Manual techniques PT Plan: Begin t-band decompressive exercises and standing UE flexion and body mechanics education next session.     Problem List Patient Active Problem List   Diagnosis Date Noted  . Poor posture 02/22/2014  . Stiffness of joints, not elsewhere classified, multiple sites 02/22/2014  . Prediabetes 01/05/2013  . Special screening for malignant neoplasms, colon 04/27/2011  . Benign neoplasm of colon 04/27/2011  . GERD (gastroesophageal reflux disease) 04/27/2011  . Diverticulosis of colon (without  mention of hemorrhage) 04/27/2011  . CAD (coronary artery disease) 12/27/2010  . Environmental allergies 12/27/2010  . Insomnia 12/27/2010  . H. pylori infection 12/27/2010  . Barrett's esophagus 12/27/2010  . HYPERLIPIDEMIA 11/08/2008  . HYPERTENSION 11/08/2008  . GERD 11/08/2008  . ACUT PEPTC ULCR UNS SITE W/HEM W/O MENTION OBST 11/08/2008  . RENAL CALCULUS 11/08/2008  . Obstructive sleep apnea 04/12/2003    PT - End of Session Activity Tolerance: Patient tolerated treatment well General Behavior During Therapy: [facility] for tasks assessed/performed  Rachelle Hora, PTA  03/01/2014, 11:38 AM

## 2014-03-08 ENCOUNTER — Ambulatory Visit (HOSPITAL_COMMUNITY)
Admission: RE | Admit: 2014-03-08 | Discharge: 2014-03-08 | Disposition: A | Discharge status: Home / Self Care | Payer: Medicare HMO | Source: Ambulatory Visit | Attending: Family Medicine | Admitting: Family Medicine

## 2014-03-08 DIAGNOSIS — M2560 Stiffness of unspecified joint, not elsewhere classified: Secondary | ICD-10-CM

## 2014-03-08 DIAGNOSIS — R293 Abnormal posture: Secondary | ICD-10-CM

## 2014-03-08 NOTE — Progress Notes (Signed)
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     Exercise/Treatments       Stretches Active Hamstring Stretch: 3 reps;30 seconds Single Knee to Chest Stretch: 3 reps;30 seconds Press Ups: 3 reps Quad Stretch: 3 reps;30 seconds   Supine Other Supine Lumbar Exercises: decompressive ex 1-5 Other Supine Lumbar Exercises: t-band decompression exercises.     Manual Therapy Manual Therapy: Joint mobilization Joint Mobilization: Grade II jt mobs to L1-L5 with manual to paraspinal mm to decrease tightness  Physical Therapy Assessment and Plan PT Assessment and Plan Clinical Impression Statement: Stressed to pt that our goal is not to strengthen pt but to decompress pt spine as well as improve flexibility pt does not have any weakness pain comes from decreased mobility.  Pt demonstrated poor body mechanics with transition from sit to supine and supine to sti educated pt in proper body mechanics.  PT Plan: begin t-band postural exercises reassess for discharge    Goals  progressing  Problem List Patient Active Problem List   Diagnosis Date Noted  . Poor posture 02/22/2014  . Stiffness of joints, not elsewhere classified, multiple sites 02/22/2014  . Prediabetes 01/05/2013  . Special screening for malignant neoplasms, colon 04/27/2011  . Benign neoplasm of colon 04/27/2011  . GERD (gastroesophageal reflux disease) 04/27/2011  . Diverticulosis of colon (without mention of hemorrhage) 04/27/2011  . CAD (coronary artery disease) 12/27/2010  . Environmental  allergies 12/27/2010  . Insomnia 12/27/2010  . H. pylori infection 12/27/2010  . Barrett's esophagus 12/27/2010  . HYPERLIPIDEMIA 11/08/2008  . HYPERTENSION 11/08/2008  . GERD 11/08/2008  . ACUT PEPTC ULCR UNS SITE W/HEM W/O MENTION OBST 11/08/2008  . RENAL CALCULUS 11/08/2008  . Obstructive sleep apnea 04/12/2003       GP Functional Assessment Tool Used: clinical judgement  Andrew [name] 03/08/2014, 4:50 PM

## 2014-03-15 ENCOUNTER — Ambulatory Visit (HOSPITAL_COMMUNITY)
Admission: RE | Admit: 2014-03-15 | Discharge: 2014-03-15 | Disposition: A | Discharge status: Home / Self Care | Payer: Medicare HMO | Source: Ambulatory Visit | Attending: Family Medicine | Admitting: Family Medicine

## 2014-03-15 DIAGNOSIS — M545 Low back pain, unspecified: Secondary | ICD-10-CM | POA: Insufficient documentation

## 2014-03-15 DIAGNOSIS — IMO0001 Reserved for inherently not codable concepts without codable children: Secondary | ICD-10-CM | POA: Insufficient documentation

## 2014-03-15 DIAGNOSIS — I10 Essential (primary) hypertension: Secondary | ICD-10-CM | POA: Insufficient documentation

## 2014-03-15 NOTE — Progress Notes (Signed)
Physical Therapy reassessment/Discharge  Patient Details  Name: Andrew [name] MRN: [id number] Date of Birth: [birth year]  Today's Date: 03/15/2014 Time: 0940-1005 PT Time Calculation (min): 25 min              Visit#: 4 of 4  Re-eval:   Assessment Diagnosis: LBP  Authorization: [employer] medicare  Charges:  MMT/ROM testing [id number], self care  [phone] ([phone]    Subjective Symptoms/Limitations Symptoms: Pt states he continues to have pain occasionally in his lumbar region but overall improved.  Currently without pain.  Pt reports he plans on returning to MD regarding swelling into his Rt LE.  Explains he had veins removed from this LE for his heart surgery.  Therapist also recommended he return to MD and may need to wear compression hose.   Pain Assessment Currently in Pain?: No/denies   Assessment RLE Strength Right Hip Extension: 5/5 (was 3+/5) LLE Strength Left Hip Extension: 5/5 (was 3+/5) Lumbar AROM Lumbar Flexion: decreased 30% (was decreased 30%) Lumbar Extension: decreased 30% (was decreased 80%) Lumbar - Right Side Bend: decreased 30% (was decreased 50%) Lumbar - Left Side Bend: decreased 30% (was decreased 30%) Lumbar - Right Rotation: decreased 30% (was decreased 30%) Lumbar - Left Rotation: decreased 30% (was decreased 40%)  Exercise/Treatments Mobility/Balance  Posture/Postural Control Posture/Postural Control: Postural limitations Postural Limitations: forward head, increased kyphosis decreased lordosis  Standing Scapular Retraction: 10 reps;Theraband Theraband Level (Scapular Retraction): Level 3 (Green) Row: 10 reps;Theraband Theraband Level (Row): Level 3 (Green) Shoulder Extension: 10 reps;Theraband Theraband Level (Shoulder Extension): Level 3 (Green)    Physical Therapy Assessment and Plan PT Assessment and Plan Clinical Impression Statement: Pt has completed 4 visits for lumbar pain.  Pt educated in postural strengthening and awareness, stretching and  strengthening.  Pt has met 2/3 STG's and 3/4 LTG's.  Overall hamstring strength and lumbar ROM have improved.  Pt given postural three exercises for HEP and able to demonstrate appropriately.  Continued to educate on body mechanics and postural awareness.  Pt feels he can continue exercises at home independently.   PT Plan: Per evaluating therapist and patient agreement, discharge to HEP.    Goals Home Exercise Program Pt/caregiver will Perform Home Exercise Program: For increased ROM PT Short Term Goals Time to Complete Short Term Goals: 2 weeks PT Short Term Goal 1: Pain no greater than a  3/10  PT Short Term Goal 1 - Progress: Partly met (occasionally goes up to 4/10) PT Short Term Goal 2: Pt to verbalize the importance of posture in back care. PT Short Term Goal 2 - Progress: Met PT Long Term Goals Time to Complete Long Term Goals: 4 weeks PT Long Term Goal 1: Pt I in advance HEP PT Long Term Goal 1 - Progress: Met PT Long Term Goal 2: Pt to only have back pain 1-2 times a week PT Long Term Goal 2 - Progress: Met Long Term Goal 3: Pain to never go above a 2/10 Long Term Goal 3 Progress: Not met  Problem List Patient Active Problem List   Diagnosis Date Noted  . Poor posture 02/22/2014  . Stiffness of joints, not elsewhere classified, multiple sites 02/22/2014  . Prediabetes 01/05/2013  . Special screening for malignant neoplasms, colon 04/27/2011  . Benign neoplasm of colon 04/27/2011  . GERD (gastroesophageal reflux disease) 04/27/2011  . Diverticulosis of colon (without mention of hemorrhage) 04/27/2011  . CAD (coronary artery disease) 12/27/2010  . Environmental allergies 12/27/2010  . Insomnia 12/27/2010  . H.  pylori infection 12/27/2010  . Barrett's esophagus 12/27/2010  . HYPERLIPIDEMIA 11/08/2008  . HYPERTENSION 11/08/2008  . GERD 11/08/2008  . ACUT PEPTC ULCR UNS SITE W/HEM W/O MENTION OBST 11/08/2008  . RENAL CALCULUS 11/08/2008  . Obstructive sleep apnea  04/12/2003    PT Plan of Care PT Home Exercise Plan: postural 3 theraband exercises Consulted and Agree with Plan of Care: Patient  GP Functional Assessment Tool Used: clinical judgement Functional Limitation: Changing and maintaining body position Changing and Maintaining Body Position Goal Status (P6886): 0 percent impaired, limited or restricted Changing and Maintaining Body Position Discharge Status (Y8472): At least 1 percent but less than 20 percent impaired, limited or restricted  Teena Irani, PTA/CLT 03/15/2014, 10:17 AM  Physician Documentation Your signature is required to indicate approval of the treatment plan as stated above.  Please sign and either send electronically or make a copy of this report for your files and return this physician signed original.   Please mark one 1.__approve of plan  2. ___approve of plan with the following conditions.   ______________________________                                                          _____________________ Physician Signature                                                                                                             Date

## 2014-03-22 ENCOUNTER — Inpatient Hospital Stay (HOSPITAL_COMMUNITY): Admission: RE | Admit: 2014-03-22 | Payer: Medicare HMO | Source: Ambulatory Visit | Admitting: Physical Therapy

## 2014-05-24 ENCOUNTER — Ambulatory Visit (INDEPENDENT_AMBULATORY_CARE_PROVIDER_SITE_OTHER): Payer: Medicare HMO | Admitting: Family Medicine

## 2014-05-24 ENCOUNTER — Encounter: Payer: Self-pay | Admitting: Family Medicine

## 2014-05-24 VITALS — BP 130/70 | HR 60 | Temp 97.9°F | Resp 16 | Ht 69.0 in | Wt 166.0 lb

## 2014-05-24 DIAGNOSIS — K921 Melena: Secondary | ICD-10-CM

## 2014-05-24 NOTE — Progress Notes (Signed)
Subjective:    Patient ID: Andrew [name], male    DOB: 27-Jun-1941, 73 y.o.   MRN: [id number]  HPI  @ days ago the patient had one episode of hematochezia.  There was enough blood to turn the water red.  He did strain to defecate due to constipation and also experienced mild pain with defecation.  Patient is on aspirin and has severe diverticulosis on colonoscopy in 2012.  He denies abd pain, fever, diarrhea, or syncope/orthostatic dizziness.  He has had several normal BM since then. Past Medical History  Diagnosis Date  . Allergy     grass etc.  . Myocardial infarction   . GERD (gastroesophageal reflux disease)     Barrett's  . Hyperlipidemia   . Hypertension   . Chronic kidney disease     kidney stones  . BPH (benign prostatic hyperplasia)   . Ulcer 1960  . Coronary artery disease   . Barrett's esophagus   . Chest pain, atypical   . Hernia    Past Surgical History  Procedure Laterality Date  . Coronary artery bypass graft      twice  2 by passes each time  . Cholecystectomy    . Colonoscopy    . Upper gastrointestinal endoscopy    . Inguinal hernia repair      bilateral done twice  . Lithotripsy    . Cardiac catheterization  02/14/2007    MITRAL REGURGITATION. LV NORMAL   Current Outpatient Prescriptions on File Prior to Visit  Medication Sig Dispense Refill  . aspirin 81 MG tablet Take 81 mg by mouth daily.        . Cholecalciferol (VITAMIN D3 PO) Take 2,000 Units by mouth daily.      . cyclobenzaprine (FLEXERIL) 10 MG tablet Take 1 tablet (10 mg total) by mouth 3 (three) times daily as needed for muscle spasms.  30 tablet  0  . Dutasteride-Tamsulosin HCl (JALYN) 0.5-0.4 MG CAPS Take by mouth daily.        [name] ibuprofen (ADVIL,MOTRIN) 800 MG tablet Take 800 mg by mouth 2 (two) times daily.      . lansoprazole (PREVACID) 30 MG capsule TAKE 1 CAPSULE DAILY  90 capsule  3  . metoprolol succinate (TOPROL-XL) 25 MG 24 hr tablet Take 1 tablet (25 mg total) by mouth daily.  90  tablet  1  . mometasone (NASONEX) 50 MCG/ACT nasal spray Place 2 sprays into the nose daily.  17 g  12  . Multiple Vitamin (MULTIVITAMIN) tablet Take 1 tablet by mouth daily.        . nitroGLYCERIN (NITROSTAT) 0.4 MG SL tablet Place 1 tablet (0.4 mg total) under the tongue every 5 (five) minutes as needed.  25 tablet  5  . Omega-3 Fatty Acids (FISH OIL) 1000 MG CPDR Take 1,000 mg by mouth daily.        . simvastatin (ZOCOR) 10 MG tablet Take 1 tablet (10 mg total) by mouth at bedtime.  90 tablet  1   No current facility-administered medications on file prior to visit.   Allergies  Allergen Reactions  . Imdur [Isosorbide Mononitrate]   . Meloxicam     Facial swelling    History   Social History  . Marital Status: Married    Spouse Name: N/A    Number of Children: N/A  . Years of Education: N/A   Occupational History  . Not on file.   Social History Main Topics  . Smoking status:  Never Smoker   . Smokeless tobacco: Never Used  . Alcohol Use: No  . Drug Use: No  . Sexual Activity: Not on file   Other Topics Concern  . Not on file   Social History Narrative  . No narrative on file     Review of Systems  All other systems reviewed and are negative.      Objective:   Physical Exam  Vitals reviewed. Constitutional: He appears well-developed and well-nourished.  Cardiovascular: Normal rate, regular rhythm and normal heart sounds.   Pulmonary/Chest: Effort normal and breath sounds normal. No respiratory distress. He has no wheezes. He has no rales.  Abdominal: Soft. Bowel sounds are normal. He exhibits no distension. There is no tenderness. There is no rebound and no guarding.  Genitourinary: Rectum normal. Rectal exam shows no external hemorrhoid, no internal hemorrhoid, no fissure, no mass, no tenderness and anal tone normal. Prostate is not enlarged.          Assessment & Plan:  1. Hematochezia I suspect diverticulosis vs int hemorrhoid.  I recommended he hold  aspirin for 3 days, take a stool softener to avoid constipation, drink plenty of water.  If it reoccurs, recommend repeat colonoscopy, but go to ER if severe.  If no further episodes occur, no follow up is needed.

## 2014-07-03 ENCOUNTER — Telehealth: Payer: Self-pay | Admitting: Family Medicine

## 2014-07-03 NOTE — Telephone Encounter (Signed)
Pt will be call back to schedule [facility]

## 2014-07-09 ENCOUNTER — Ambulatory Visit (INDEPENDENT_AMBULATORY_CARE_PROVIDER_SITE_OTHER): Payer: Medicare HMO | Admitting: Cardiovascular Disease

## 2014-07-09 ENCOUNTER — Encounter: Payer: Self-pay | Admitting: Cardiovascular Disease

## 2014-07-09 VITALS — BP 120/62 | HR 65 | Ht 69.0 in | Wt 168.2 lb

## 2014-07-09 DIAGNOSIS — I10 Essential (primary) hypertension: Secondary | ICD-10-CM

## 2014-07-09 DIAGNOSIS — E785 Hyperlipidemia, unspecified: Secondary | ICD-10-CM

## 2014-07-09 DIAGNOSIS — I2510 Atherosclerotic heart disease of native coronary artery without angina pectoris: Secondary | ICD-10-CM

## 2014-07-09 MED ORDER — NITROGLYCERIN 0.4 MG SL SUBL: 0.4000 mg | SUBLINGUAL_TABLET | SUBLINGUAL | Status: DC | PRN

## 2014-07-09 NOTE — Patient Instructions (Signed)
Your physician recommends that you continue on your current medications as directed. Please refer to the Current Medication list given to you today.  Your physician wants you to follow-up in: 1 year with Dr. Acie Fredrickson.  You will receive a reminder letter in the mail two months in advance. If you don't receive a letter, please call our office to schedule the follow-up appointment. Your physician recommends that you return for lab work in: 12 months on the day of or a few days before your office visit with Dr. Acie Fredrickson.  You will need to FAST for this appointment - nothing to eat or drink after midnight the night before except water.

## 2014-07-09 NOTE — Assessment & Plan Note (Signed)
Andrew [name] is doing well. Is not having episodes of angina.

## 2014-07-09 NOTE — Assessment & Plan Note (Signed)
We will check fasting lipids at his next office visit.

## 2014-07-09 NOTE — Progress Notes (Signed)
Andrew [name] Date of Birth  1940/12/27 [facility] [address]    [place], [place]  [address] [phone]  Fax  [phone]  History of Present Illness:  Andrew [name] is a 73 year old gentleman with a history of coronary artery disease. He status post coronary artery bypass grafting. He also has a history of dyslipidemia.  He's recently been having some problems with back pain.  These episodes of back pain would typically occur when he is doing his normal household chores. It would typically occur in the middle of his back and radiate up through to the front of his chest and between the shoulder blades.  This was not similar to his previous episodes of angina prior to his bypass grafting.  He's been exercising on a regular basis and has not had these symptoms with exercise.   Sept. 11. 2014:  Andrew [name] is doing ok.  No angina.  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  No   Sept. 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  Raised a garden this years.  No CP. Works out regularly.    Current Outpatient Prescriptions on File Prior to Visit  Medication Sig Dispense Refill  . aspirin 81 MG tablet Take 81 mg by mouth daily.        . Cholecalciferol (VITAMIN D3 PO) Take 2,000 Units by mouth daily.      . Dutasteride-Tamsulosin HCl (JALYN) 0.5-0.4 MG CAPS Take by mouth daily.        . lansoprazole (PREVACID) 30 MG capsule TAKE 1 CAPSULE DAILY  90 capsule  3  . metoprolol succinate (TOPROL-XL) 25 MG 24 hr tablet Take 1 tablet (25 mg total) by mouth daily.  90 tablet  1  . Multiple Vitamin (MULTIVITAMIN) tablet Take 1 tablet by mouth daily.        . nitroGLYCERIN (NITROSTAT) 0.4 MG SL tablet Place 1 tablet (0.4 mg total) under the tongue every 5 (five) minutes as needed.  25 tablet  5  . Omega-3 Fatty Acids (FISH OIL) 1000 MG CPDR Take 1,000 mg by mouth daily.        . simvastatin (ZOCOR) 10 MG tablet Take 1 tablet (10 mg total) by mouth at bedtime.  90 tablet  1   No  current facility-administered medications on file prior to visit.    Allergies  Allergen Reactions  . Imdur [Isosorbide Mononitrate]   . Meloxicam     Facial swelling     Past Medical History  Diagnosis Date  . Allergy     grass etc.  . Myocardial infarction   . GERD (gastroesophageal reflux disease)     Barrett's  . Hyperlipidemia   . Hypertension   . Chronic kidney disease     kidney stones  . BPH (benign prostatic hyperplasia)   . Ulcer 1960  . Coronary artery disease   . Barrett's esophagus   . Chest pain, atypical   . Hernia     Past Surgical History  Procedure Laterality Date  . Coronary artery bypass graft      twice  2 by passes each time  . Cholecystectomy    . Colonoscopy    . Upper gastrointestinal endoscopy    . Inguinal hernia repair      bilateral done twice  . Lithotripsy    . Cardiac catheterization  02/14/2007    MITRAL REGURGITATION. LV NORMAL    History  Smoking status  . Never Smoker  Smokeless tobacco  . Never Used    History  Alcohol Use No    Family History  Problem Relation Age of Onset  . Heart disease Father     Reviw of Systems:  Reviewed in the HPI.  All other systems are negative.  Physical Exam: BP 120/62  Pulse 65  Ht 5\' 9"  (1.753 m)  Wt 168 lb 3.2 oz (76.295 kg)  BMI 24.83 kg/m2 The patient is alert and oriented x 3.  The mood and affect are normal.   Skin: warm and dry.  Color is normal.    HEENT:   Normal carotids. No JVD.  Lungs: Clear.   Heart: Regular rate. He has no murmurs.    Abdomen: His good bowel sounds. He is no hepatosplenomegaly.  Extremities:  No clubbing cyanosis or edema  Neuro:  Nonfocal. His gait is normal.     ECG: Sept. 28, 2015:  NSR 65.  No ST abn.    Assessment / Plan:

## 2014-07-30 ENCOUNTER — Other Ambulatory Visit: Payer: Self-pay | Admitting: Family Medicine

## 2014-07-30 DIAGNOSIS — E785 Hyperlipidemia, unspecified: Secondary | ICD-10-CM

## 2014-07-30 DIAGNOSIS — Z79899 Other long term (current) drug therapy: Secondary | ICD-10-CM

## 2014-07-30 DIAGNOSIS — I10 Essential (primary) hypertension: Secondary | ICD-10-CM

## 2014-08-02 ENCOUNTER — Other Ambulatory Visit: Payer: Medicare HMO

## 2014-08-02 DIAGNOSIS — I10 Essential (primary) hypertension: Secondary | ICD-10-CM

## 2014-08-02 DIAGNOSIS — Z79899 Other long term (current) drug therapy: Secondary | ICD-10-CM

## 2014-08-02 DIAGNOSIS — E785 Hyperlipidemia, unspecified: Secondary | ICD-10-CM

## 2014-08-02 LAB — LIPID PANEL
CHOL/HDL RATIO: 2.8 ratio
CHOLESTEROL: 101 mg/dL (ref 0–200)
HDL: 36 mg/dL — ABNORMAL LOW (ref >39)
LDL CALC: 50 mg/dL (ref 0–99)
Triglycerides: 75 mg/dL (ref <150)
VLDL: 15 mg/dL (ref 0–40)

## 2014-08-02 LAB — COMPREHENSIVE METABOLIC PANEL
ALT: 15 U/L (ref 0–53)
AST: 16 U/L (ref 0–37)
Albumin: 3.9 g/dL (ref 3.5–5.2)
Alkaline Phosphatase: 51 U/L (ref 39–117)
BUN: 20 mg/dL (ref 6–23)
CHLORIDE: 106 meq/L (ref 96–112)
CO2: 26 mEq/L (ref 19–32)
Calcium: 8.8 mg/dL (ref 8.4–10.5)
Creat: 0.86 mg/dL (ref 0.50–1.35)
Glucose, Bld: 116 mg/dL — ABNORMAL HIGH (ref 70–99)
POTASSIUM: 4.1 meq/L (ref 3.5–5.3)
Sodium: 141 mEq/L (ref 135–145)
Total Bilirubin: 0.7 mg/dL (ref 0.2–1.2)
Total Protein: 6 g/dL (ref 6.0–8.3)

## 2014-08-02 LAB — CBC WITH DIFFERENTIAL/PLATELET
Basophils Absolute: 0.1 10*3/uL (ref 0.0–0.1)
Basophils Relative: 1 % (ref 0–1)
Eosinophils Absolute: 0.1 10*3/uL (ref 0.0–0.7)
Eosinophils Relative: 1 % (ref 0–5)
HEMATOCRIT: 41.5 % (ref 39.0–52.0)
HEMOGLOBIN: 14.4 g/dL (ref 13.0–17.0)
LYMPHS PCT: 22 % (ref 12–46)
Lymphs Abs: 1.4 10*3/uL (ref 0.7–4.0)
MCH: 29.9 pg (ref 26.0–34.0)
MCHC: 34.7 g/dL (ref 30.0–36.0)
MCV: 86.3 fL (ref 78.0–100.0)
MONO ABS: 0.6 10*3/uL (ref 0.1–1.0)
Monocytes Relative: 10 % (ref 3–12)
Neutro Abs: 4.1 10*3/uL (ref 1.7–7.7)
Neutrophils Relative %: 66 % (ref 43–77)
Platelets: 140 10*3/uL — ABNORMAL LOW (ref 150–400)
RBC: 4.81 MIL/uL (ref 4.22–5.81)
RDW: 13.3 % (ref 11.5–15.5)
WBC: 6.2 10*3/uL (ref 4.0–10.5)

## 2014-08-07 ENCOUNTER — Encounter: Payer: Medicare HMO | Admitting: Family Medicine

## 2014-08-17 ENCOUNTER — Other Ambulatory Visit: Payer: Self-pay | Admitting: Family Medicine

## 2014-08-17 MED ORDER — SIMVASTATIN 10 MG PO TABS: 10.0000 mg | ORAL_TABLET | Freq: Every day | ORAL | Status: DC

## 2014-08-17 MED ORDER — METOPROLOL SUCCINATE ER 25 MG PO TB24: 25.0000 mg | ORAL_TABLET | Freq: Every day | ORAL | Status: DC

## 2014-08-17 NOTE — Telephone Encounter (Signed)
Med sent to pharm 

## 2014-09-10 ENCOUNTER — Ambulatory Visit (INDEPENDENT_AMBULATORY_CARE_PROVIDER_SITE_OTHER): Payer: Medicare HMO | Admitting: Family Medicine

## 2014-09-10 ENCOUNTER — Encounter: Payer: Self-pay | Admitting: Family Medicine

## 2014-09-10 VITALS — BP 128/74 | HR 60 | Temp 98.0°F | Resp 16 | Ht 69.0 in | Wt 169.0 lb

## 2014-09-10 DIAGNOSIS — Z23 Encounter for immunization: Secondary | ICD-10-CM

## 2014-09-10 DIAGNOSIS — Z0000 Encounter for general adult medical examination without abnormal findings: Secondary | ICD-10-CM

## 2014-09-10 NOTE — Addendum Note (Signed)
Addended by: Shary Decamp B on: 09/10/2014 09:32 AM   Modules accepted: Orders

## 2014-09-10 NOTE — Progress Notes (Signed)
Subjective:    Patient ID: Andrew [name], male    DOB: [birth year], 73 y.o.   MRN: [id number]  HPI Patient is a very pleasant 73 year old white male who is here today for complete physical exam. His last colonoscopy was in 2012 and was significant for a polyp. Repeat colonoscopy was recommended in 5-10 years. His PSA and digital rectal exam was performed by his urologist in June 2015 and is up-to-date. Patient has had a shingles vaccine. He had Pneumovax 23 in the past. He is due for his flu shot as well as Prevnar. He is also due for a tetanus shot but he declines that today due to cost. His most recent lab work as listed below: No visits with results within 1 Month(s) from this visit. Latest known visit with results is:  Appointment on 08/02/2014  Component Date Value Ref Range Status  . WBC 08/02/2014 6.2  4.0 - 10.5 K/uL Final  . RBC 08/02/2014 4.81  4.22 - 5.81 MIL/uL Final  . Hemoglobin 08/02/2014 14.4  13.0 - 17.0 g/dL Final  . HCT 08/02/2014 41.5  39.0 - 52.0 % Final  . MCV 08/02/2014 86.3  78.0 - 100.0 fL Final  . MCH 08/02/2014 29.9  26.0 - 34.0 pg Final  . MCHC 08/02/2014 34.7  30.0 - 36.0 g/dL Final  . RDW 08/02/2014 13.3  11.5 - 15.5 % Final  . Platelets 08/02/2014 140* 150 - 400 K/uL Final  . Neutrophils Relative % 08/02/2014 66  43 - 77 % Final  . Neutro Abs 08/02/2014 4.1  1.7 - 7.7 K/uL Final  . Lymphocytes Relative 08/02/2014 22  12 - 46 % Final  . Lymphs Abs 08/02/2014 1.4  0.7 - 4.0 K/uL Final  . Monocytes Relative 08/02/2014 10  3 - 12 % Final  . Monocytes Absolute 08/02/2014 0.6  0.1 - 1.0 K/uL Final  . Eosinophils Relative 08/02/2014 1  0 - 5 % Final  . Eosinophils Absolute 08/02/2014 0.1  0.0 - 0.7 K/uL Final  . Basophils Relative 08/02/2014 1  0 - 1 % Final  . Basophils Absolute 08/02/2014 0.1  0.0 - 0.1 K/uL Final  . Smear Review 08/02/2014 Criteria for review not met   Final  . Sodium 08/02/2014 141  135 - 145 mEq/L Final  . Potassium 08/02/2014 4.1  3.5 -  5.3 mEq/L Final  . Chloride 08/02/2014 106  96 - 112 mEq/L Final  . CO2 08/02/2014 26  19 - 32 mEq/L Final  . Glucose, Bld 08/02/2014 116* 70 - 99 mg/dL Final  . BUN 08/02/2014 20  6 - 23 mg/dL Final  . Creat 08/02/2014 0.86  0.50 - 1.35 mg/dL Final  . Total Bilirubin 08/02/2014 0.7  0.2 - 1.2 mg/dL Final  . Alkaline Phosphatase 08/02/2014 51  39 - 117 U/L Final  . AST 08/02/2014 16  0 - 37 U/L Final  . ALT 08/02/2014 15  0 - 53 U/L Final  . Total Protein 08/02/2014 6.0  6.0 - 8.3 g/dL Final  . Albumin 08/02/2014 3.9  3.5 - 5.2 g/dL Final  . Calcium 08/02/2014 8.8  8.4 - 10.5 mg/dL Final  . Cholesterol 08/02/2014 101  0 - 200 mg/dL Final   Comment: ATP III Classification:                                < 200        mg/dL  Desirable                               200 - 239     mg/dL        Borderline High                               >= 240        mg/dL        High                             . Triglycerides 08/02/2014 75  <150 mg/dL Final  . HDL 08/02/2014 36* >39 mg/dL Final  . Total CHOL/HDL Ratio 08/02/2014 2.8   Final  . VLDL 08/02/2014 15  0 - 40 mg/dL Final  . LDL Cholesterol 08/02/2014 50  0 - 99 mg/dL Final   Comment:                            Total Cholesterol/HDL Ratio:CHD Risk                                                 Coronary Heart Disease Risk Table                                                                 Men       Women                                   1/2 Average Risk              3.4        3.3                                       Average Risk              5.0        4.4                                    2X Average Risk              9.6        7.1                                    3X Average Risk             23.4       11.0                          Use the calculated Patient  Ratio above and the CHD Risk table                           to determine the patient's CHD Risk.                          ATP III Classification (LDL):                                 < 100        mg/dL         Optimal                               100 - 129     mg/dL         Near or Above Optimal                               130 - 159     mg/dL         Borderline High                               160 - 189     mg/dL         High                                > 190        mg/dL         Very High                              Past Medical History  Diagnosis Date  . Allergy     grass etc.  . Myocardial infarction   . GERD (gastroesophageal reflux disease)     Barrett's  . Hyperlipidemia   . Hypertension   . Chronic kidney disease     kidney stones  . BPH (benign prostatic hyperplasia)   . Ulcer 1960  . Coronary artery disease   . Barrett's esophagus   . Chest pain, atypical   . Hernia    Past Surgical History  Procedure Laterality Date  . Coronary artery bypass graft      twice  2 by passes each time  . Cholecystectomy    . Colonoscopy    . Upper gastrointestinal endoscopy    . Inguinal hernia repair      bilateral done twice  . Lithotripsy    . Cardiac catheterization  02/14/2007    MITRAL REGURGITATION. LV NORMAL   Current Outpatient Prescriptions on File Prior to Visit  Medication Sig Dispense Refill  . aspirin 81 MG tablet Take 81 mg by mouth daily.      . Cholecalciferol (VITAMIN D3 PO) Take 2,000 Units by mouth daily.    . Dutasteride-Tamsulosin HCl (JALYN) 0.5-0.4 MG CAPS Take by mouth daily.      . lansoprazole (PREVACID) 30 MG capsule TAKE 1 CAPSULE DAILY 90 capsule 3  . metoprolol succinate (TOPROL-XL) 25 MG 24 hr tablet Take 1 tablet (25 mg total) by mouth daily. 90 tablet 3  .  Multiple Vitamin (MULTIVITAMIN) tablet Take 1 tablet by mouth daily.      . nitroGLYCERIN (NITROSTAT) 0.4 MG SL tablet Place 1 tablet (0.4 mg total) under the tongue every 5 (five) minutes as needed. 25 tablet 5  . Omega-3 Fatty Acids (FISH OIL) 1000 MG CPDR Take 1,000 mg by mouth daily.      . simvastatin (ZOCOR) 10 MG tablet Take 1 tablet (10 mg total)  by mouth at bedtime. 90 tablet 3   No current facility-administered medications on file prior to visit.   Allergies  Allergen Reactions  . Imdur [Isosorbide Mononitrate]   . Meloxicam     Facial swelling    History   Social History  . Marital Status: Married    Spouse Name: N/A    Number of Children: N/A  . Years of Education: N/A   Occupational History  . Not on file.   Social History Main Topics  . Smoking status: Never Smoker   . Smokeless tobacco: Never Used  . Alcohol Use: No  . Drug Use: No  . Sexual Activity: Not on file   Other Topics Concern  . Not on file   Social History Narrative   Family History  Problem Relation Age of Onset  . Heart disease Father       Review of Systems  All other systems reviewed and are negative.      Objective:   Physical Exam  Constitutional: He is oriented to person, place, and time. He appears well-developed and well-nourished. No distress.  HENT:  Head: Normocephalic and atraumatic.  Right Ear: External ear normal.  Left Ear: External ear normal.  Nose: Nose normal.  Mouth/Throat: Oropharynx is clear and moist. No oropharyngeal exudate.  Eyes: Conjunctivae and EOM are normal. Pupils are equal, round, and reactive to light. Right eye exhibits no discharge. Left eye exhibits no discharge. No scleral icterus.  Neck: Normal range of motion. Neck supple. No JVD present. No tracheal deviation present. No thyromegaly present.  Cardiovascular: Normal rate, regular rhythm, normal heart sounds and intact distal pulses.  Exam reveals no gallop and no friction rub.   No murmur heard. Pulmonary/Chest: Effort normal and breath sounds normal. No stridor. No respiratory distress. He has no wheezes. He has no rales. He exhibits no tenderness.  Abdominal: Soft. Bowel sounds are normal. He exhibits no distension and no mass. There is no tenderness. There is no rebound and no guarding.  Musculoskeletal: Normal range of motion. He  exhibits no edema or tenderness.  Lymphadenopathy:    He has no cervical adenopathy.  Neurological: He is alert and oriented to person, place, and time. He has normal reflexes. He displays normal reflexes. No cranial nerve deficit. He exhibits normal muscle tone. Coordination normal.  Skin: Skin is warm. No rash noted. He is not diaphoretic. No erythema. No pallor.  Psychiatric: He has a normal mood and affect. His behavior is normal. Judgment and thought content normal.  Vitals reviewed.         Assessment & Plan:  Routine general medical examination at a health care facility  Patient's physical exam is completely normal. His blood pressure is excellent. His colonoscopy and digital rectal exam is up-to-date. Patient received Prevnar 13 as well as the flu shot. He declined the tetanus vaccine. The remainder of his immunizations are up-to-date. His blood work is is excellent except for prediabetes and dyslipidemia. I recommended a low carbohydrate diet, regular aerobic exercise. I will recheck his blood sugar and  HDL in 6 months. At the present time there is no indication for medication. Regular anticipatory guidance is provided. There is no evidence of depression or dementia. The patient is not a fall risk.

## 2014-10-26 ENCOUNTER — Ambulatory Visit (INDEPENDENT_AMBULATORY_CARE_PROVIDER_SITE_OTHER): Payer: Medicare HMO | Admitting: Family Medicine

## 2014-10-26 ENCOUNTER — Encounter: Payer: Self-pay | Admitting: Family Medicine

## 2014-10-26 ENCOUNTER — Telehealth: Payer: Self-pay | Admitting: Family Medicine

## 2014-10-26 VITALS — BP 136/78 | HR 68 | Temp 98.0°F | Resp 18 | Wt 168.0 lb

## 2014-10-26 DIAGNOSIS — R0789 Other chest pain: Secondary | ICD-10-CM

## 2014-10-26 DIAGNOSIS — J208 Acute bronchitis due to other specified organisms: Secondary | ICD-10-CM

## 2014-10-26 MED ORDER — AZITHROMYCIN 250 MG PO TABS: ORAL_TABLET | ORAL | Status: DC

## 2014-10-26 MED ORDER — ALUM HYDROXIDE-MAG CARBONATE 95-358 MG/15ML PO SUSP
15.0000 mL | Freq: Three times a day (TID) | ORAL | Status: DC
Start: 2014-10-26 — End: 2015-09-19

## 2014-10-26 NOTE — Telephone Encounter (Signed)
C/O HA x few days.  Now says having chest discomfort also for a few days.  Having a lot of belching.  Undescribable discomfort.  He thinks he has pneumonia.  Has been told to go to ED but will not take any advise except from you.

## 2014-10-26 NOTE — Progress Notes (Signed)
Subjective:    Patient ID: Andrew [name], male    DOB: 1941-08-18, 74 y.o.   MRN: [id number]  HPI Patient has had a cough productive of white sputum for the last 3 days. He denies any fever. He denies any shortness of breath. He denies any pleurisy. He also has had right and left anterior chest wall pain. There are no exacerbating or alleviating factors. He denies any angina. He denies any piercing shortness of breath. He exercised this morning with no increasing discomfort or worsening shortness of breath. He does report more belching the last few days. He denies any indigestion. He is concerned that he may have pneumonia. Past Medical History  Diagnosis Date  . Allergy     grass etc.  . Myocardial infarction   . GERD (gastroesophageal reflux disease)     Barrett's  . Hyperlipidemia   . Hypertension   . Chronic kidney disease     kidney stones  . BPH (benign prostatic hyperplasia)   . Ulcer 1960  . Coronary artery disease   . Barrett's esophagus   . Chest pain, atypical   . Hernia    Past Surgical History  Procedure Laterality Date  . Coronary artery bypass graft      twice  2 by passes each time  . Cholecystectomy    . Colonoscopy    . Upper gastrointestinal endoscopy    . Inguinal hernia repair      bilateral done twice  . Lithotripsy    . Cardiac catheterization  02/14/2007    MITRAL REGURGITATION. LV NORMAL   Current Outpatient Prescriptions on File Prior to Visit  Medication Sig Dispense Refill  . aspirin 81 MG tablet Take 81 mg by mouth daily.      . Cholecalciferol (VITAMIN D3 PO) Take 2,000 Units by mouth daily.    . Dutasteride-Tamsulosin HCl (JALYN) 0.5-0.4 MG CAPS Take by mouth daily.      . lansoprazole (PREVACID) 30 MG capsule TAKE 1 CAPSULE DAILY 90 capsule 3  . metoprolol succinate (TOPROL-XL) 25 MG 24 hr tablet Take 1 tablet (25 mg total) by mouth daily. 90 tablet 3  . Multiple Vitamin (MULTIVITAMIN) tablet Take 1 tablet by mouth daily.      .  nitroGLYCERIN (NITROSTAT) 0.4 MG SL tablet Place 1 tablet (0.4 mg total) under the tongue every 5 (five) minutes as needed. 25 tablet 5  . Omega-3 Fatty Acids (FISH OIL) 1000 MG CPDR Take 1,000 mg by mouth daily.      . simvastatin (ZOCOR) 10 MG tablet Take 1 tablet (10 mg total) by mouth at bedtime. 90 tablet 3   No current facility-administered medications on file prior to visit.   Allergies  Allergen Reactions  . Imdur [Isosorbide Mononitrate]   . Meloxicam     Facial swelling    History   Social History  . Marital Status: Married    Spouse Name: N/A    Number of Children: N/A  . Years of Education: N/A   Occupational History  . Not on file.   Social History Main Topics  . Smoking status: Never Smoker   . Smokeless tobacco: Never Used  . Alcohol Use: No  . Drug Use: No  . Sexual Activity: Not on file   Other Topics Concern  . Not on file   Social History Narrative      Review of Systems  All other systems reviewed and are negative.      Objective:  Physical Exam  Constitutional: He appears well-developed and well-nourished. No distress.  HENT:  Right Ear: External ear normal.  Left Ear: External ear normal.  Nose: Nose normal.  Mouth/Throat: Oropharynx is clear and moist. No oropharyngeal exudate.  Eyes: Conjunctivae are normal. Pupils are equal, round, and reactive to light.  Neck: Neck supple.  Cardiovascular: Normal rate, regular rhythm and normal heart sounds.  Exam reveals no gallop and no friction rub.   No murmur heard. Pulmonary/Chest: Effort normal and breath sounds normal. No respiratory distress. He has no wheezes. He has no rales. He exhibits no tenderness.  Abdominal: Soft. Bowel sounds are normal.  Musculoskeletal: He exhibits no edema.  Lymphadenopathy:    He has no cervical adenopathy.  Skin: He is not diaphoretic.  Vitals reviewed.         Assessment & Plan:  Other chest pain - Plan: EKG 12-Lead, aluminum hydroxide-magnesium  carbonate (GAVISCON) 95-358 MG/15ML SUSP  Acute bronchitis due to other specified organisms - Plan: azithromycin (ZITHROMAX) 250 MG tablet  EKG shows normal sinus rhythm with no evidence of ischemia or infarction. There are no EKG changes when compared to his EKG from September of last year at his cardiologist's office. I believe the patient has bronchitis as he does have some faint wheezes on expiration and he is having a productive cough. I am concerned his bacterial bronchitis given the chest pain and I will cover the patient with Zithromax/Z-Pak. I believe the belching and possibly even the chest discomfort could also be do to gastroesophageal reflux disease and I will supplement his Prevacid with Gaviscon.  Recheck next week if no better or sooner if worse.

## 2014-10-26 NOTE — Telephone Encounter (Signed)
Per provider have pt come in today at 230.  Pt aware

## 2014-10-30 ENCOUNTER — Ambulatory Visit: Payer: Self-pay | Admitting: Family Medicine

## 2015-01-21 ENCOUNTER — Encounter: Payer: Self-pay | Admitting: Family Medicine

## 2015-01-21 ENCOUNTER — Telehealth: Payer: Self-pay | Admitting: *Deleted

## 2015-01-21 ENCOUNTER — Ambulatory Visit (INDEPENDENT_AMBULATORY_CARE_PROVIDER_SITE_OTHER): Payer: Medicare HMO | Admitting: Family Medicine

## 2015-01-21 VITALS — BP 140/68 | HR 68 | Temp 97.6°F | Resp 16 | Ht 69.0 in | Wt 169.0 lb

## 2015-01-21 DIAGNOSIS — R079 Chest pain, unspecified: Secondary | ICD-10-CM

## 2015-01-21 DIAGNOSIS — M545 Low back pain, unspecified: Secondary | ICD-10-CM

## 2015-01-21 DIAGNOSIS — M25552 Pain in left hip: Secondary | ICD-10-CM | POA: Diagnosis not present

## 2015-01-21 NOTE — Telephone Encounter (Signed)
Pt called this morning stating he is wanting to be seen sometime this week states he has been having some chest discomfort since Thursday and Saturday he had to take a nitrogen pill and it eased his discomfort so did not go to ED. Also, states he is having some lower back pain and would like for it to be checked on as well, I scheduled the pt for 11:45am today to see Dr. Dennard Schaumann but also advised pt that if his symptoms get worse then needs to go to ED right away. Pt agrees.

## 2015-01-21 NOTE — Progress Notes (Signed)
Subjective:    Patient ID: Andrew [name], male    DOB: 26-Oct-1940, 74 y.o.   MRN: [id number]  HPI Patient has a history of coronary artery disease and a myocardial infarction. Over the weekend while working in his yard he developed some substernal chest pressure. He was not short of breath. The pain was a 4 out of 10. Patient took one nitroglycerin and the pain subsided in a few minutes. He has had no further issues. He denies any shortness of breath or dyspnea on exertion today. He denies any other episodes of angina. His primary concern is he has had pain in his left hip off and on for the last few weeks. Patient has a history of a left inguinal hernia repair. He had mesh put in at this site. He is very tender to palpation at the site with the surgery was performed in his left inguinal area. There is no erythema. There is no swelling. There is no hernia there on exam. There is no lymphadenopathy in that area. He is only mildly tender to palpation in that area. Internal and external rotation and flexion of the hip is completely normal and without pain. He also complains of episodic low back pain. He denies any sciatica. He denies any leg weakness. He denies any specific injury. He is mildly tender to palpation bilaterally over the lumbar paraspinal muscles. Rectal exam today is normal. There is no evidence of prostatitis. Past Medical History  Diagnosis Date  . Allergy     grass etc.  . Myocardial infarction   . GERD (gastroesophageal reflux disease)     Barrett's  . Hyperlipidemia   . Hypertension   . Chronic kidney disease     kidney stones  . BPH (benign prostatic hyperplasia)   . Ulcer 1960  . Coronary artery disease   . Barrett's esophagus   . Chest pain, atypical   . Hernia    Past Surgical History  Procedure Laterality Date  . Coronary artery bypass graft      twice  2 by passes each time  . Cholecystectomy    . Colonoscopy    . Upper gastrointestinal endoscopy    . Inguinal  hernia repair      bilateral done twice  . Lithotripsy    . Cardiac catheterization  02/14/2007    MITRAL REGURGITATION. LV NORMAL   Current Outpatient Prescriptions on File Prior to Visit  Medication Sig Dispense Refill  . aluminum hydroxide-magnesium carbonate (GAVISCON) 95-358 MG/15ML SUSP Take 15 mLs by mouth 4 (four) times daily - after meals and at bedtime. 1 Bottle 0  . aspirin 81 MG tablet Take 81 mg by mouth daily.      . Cholecalciferol (VITAMIN D3 PO) Take 2,000 Units by mouth daily.    . Dutasteride-Tamsulosin HCl (JALYN) 0.5-0.4 MG CAPS Take by mouth daily.      . lansoprazole (PREVACID) 30 MG capsule TAKE 1 CAPSULE DAILY 90 capsule 3  . metoprolol succinate (TOPROL-XL) 25 MG 24 hr tablet Take 1 tablet (25 mg total) by mouth daily. 90 tablet 3  . Multiple Vitamin (MULTIVITAMIN) tablet Take 1 tablet by mouth daily.      . nitroGLYCERIN (NITROSTAT) 0.4 MG SL tablet Place 1 tablet (0.4 mg total) under the tongue every 5 (five) minutes as needed. 25 tablet 5  . Omega-3 Fatty Acids (FISH OIL) 1000 MG CPDR Take 1,000 mg by mouth daily.      . simvastatin (ZOCOR) 10 MG tablet  Take 1 tablet (10 mg total) by mouth at bedtime. 90 tablet 3   No current facility-administered medications on file prior to visit.   Allergies  Allergen Reactions  . Imdur [Isosorbide Mononitrate]   . Meloxicam     Facial swelling    History   Social History  . Marital Status: Married    Spouse Name: N/A  . Number of Children: N/A  . Years of Education: N/A   Occupational History  . Not on file.   Social History Main Topics  . Smoking status: Never Smoker   . Smokeless tobacco: Never Used  . Alcohol Use: No  . Drug Use: No  . Sexual Activity: Not on file   Other Topics Concern  . Not on file   Social History Narrative      Review of Systems  All other systems reviewed and are negative.      Objective:   Physical Exam  Cardiovascular: Normal rate and regular rhythm.  Exam reveals  no gallop and no friction rub.   No murmur heard. Pulmonary/Chest: Effort normal and breath sounds normal. No respiratory distress. He has no wheezes. He has no rales. He exhibits no tenderness.  Abdominal: Soft. Bowel sounds are normal. He exhibits no distension. There is no tenderness. There is no rebound and no guarding.  Genitourinary: Rectum normal and prostate normal.  Musculoskeletal:       Left hip: He exhibits tenderness. He exhibits normal range of motion, normal strength, no bony tenderness, no swelling, no crepitus, no deformity and no laceration.       Lumbar back: He exhibits tenderness, pain and spasm. He exhibits normal range of motion and no bony tenderness.  Vitals reviewed.         Assessment & Plan:  Chest pain, unspecified chest pain type - Plan: EKG 12-Lead  Hip pain, left  Midline low back pain without sciatica  I believe the patient's chest pain may have been a mild case of angina that responded rapidly to nitroglycerin. It was triggered by exertion and resolved with nitroglycerin in a few moments. At the present time there is no sign of unstable angina. If the frequency of angina increases or he develops unstable angina, he will need cardiac evaluation immediately however at the present time I reassured the patient that his EKG today shows normal sinus rhythm with no evidence of ischemia or infarction. Is essentially unchanged from his EKG in January. I believe his low back pain is musculoskeletal pain. There is no evidence of a pathologic cause. I recommended ibuprofen sparingly as needed for low back pain. I believe the patient's left anterior hip pain is likely due to adhesions to the hernia mesh. I will treat this symptomatically. At the present time there is no evidence of pathology in the hip joint. There is no evidence of infection or lymphadenopathy or recurrent hernia.

## 2015-02-22 ENCOUNTER — Encounter: Payer: Self-pay | Admitting: Gastroenterology

## 2015-02-28 ENCOUNTER — Telehealth: Payer: Self-pay | Admitting: Family Medicine

## 2015-02-28 NOTE — Telephone Encounter (Signed)
[phone] PT is needing a refill on lansoprazole (PREVACID) 30 MG capsule [facility] [place], [place] - [place]  (he wants it sent to both because he is not sure the home delivery will get it to him in time)

## 2015-03-01 ENCOUNTER — Telehealth: Payer: Self-pay | Admitting: Family Medicine

## 2015-03-01 MED ORDER — LANSOPRAZOLE 30 MG PO CPDR: 30.0000 mg | DELAYED_RELEASE_CAPSULE | Freq: Every day | ORAL | Status: DC

## 2015-03-01 NOTE — Telephone Encounter (Signed)
Dr Jeffie Pollock told him to stop his Andrew [name] for one week due to side effect with his allergies and nasal congestion.  Now having extreme difficulty voiding.  Dr Jeffie Pollock is out of town.  Wants your OK to restart or not??

## 2015-03-01 NOTE — Telephone Encounter (Signed)
Medication called/sent to requested pharmacy  

## 2015-03-01 NOTE — Telephone Encounter (Signed)
Per Dr Margarette Canada restart [place].  Pt made aware and told to call Urologist on Monday and make him aware

## 2015-05-17 ENCOUNTER — Encounter: Payer: Self-pay | Admitting: Family Medicine

## 2015-05-17 ENCOUNTER — Ambulatory Visit (INDEPENDENT_AMBULATORY_CARE_PROVIDER_SITE_OTHER): Payer: Medicare HMO | Admitting: Family Medicine

## 2015-05-17 VITALS — BP 110/64 | HR 68 | Temp 98.2°F | Resp 16 | Ht 69.0 in | Wt 166.0 lb

## 2015-05-17 DIAGNOSIS — R05 Cough: Secondary | ICD-10-CM | POA: Diagnosis not present

## 2015-05-17 DIAGNOSIS — R059 Cough, unspecified: Secondary | ICD-10-CM

## 2015-05-17 NOTE — Progress Notes (Signed)
Subjective:    Patient ID: Andrew [name], male    DOB: 01-Jan-1941, 74 y.o.   MRN: [id number]  HPI Patient has a history of chronic allergies and chronic sinus issues. He uses a Netti pot every night.  Last night shortly after doing this, the patient developed a coughing spell that lasted 30-40 minutes. The cough was productive of clear mucus. He developed some pain in his chest with coughing. Patient was unable to sleep due to the coughing. Today he feels fine. He denies any fever or chills. He denies any hemoptysis. He denies a cough productive of purulent sputum. He denies any shortness of breath or dyspnea on exertion. He denies any myalgias or rigors.   Pulse oximetry is 97% on room air Past Medical History  Diagnosis Date  . Allergy     grass etc.  . Myocardial infarction   . GERD (gastroesophageal reflux disease)     Barrett's  . Hyperlipidemia   . Hypertension   . Chronic kidney disease     kidney stones  . BPH (benign prostatic hyperplasia)   . Ulcer 1960  . Coronary artery disease   . Barrett's esophagus   . Chest pain, atypical   . Hernia    Past Surgical History  Procedure Laterality Date  . Coronary artery bypass graft      twice  2 by passes each time  . Cholecystectomy    . Colonoscopy    . Upper gastrointestinal endoscopy    . Inguinal hernia repair      bilateral done twice  . Lithotripsy    . Cardiac catheterization  02/14/2007    MITRAL REGURGITATION. LV NORMAL   Current Outpatient Prescriptions on File Prior to Visit  Medication Sig Dispense Refill  . aluminum hydroxide-magnesium carbonate (GAVISCON) 95-358 MG/15ML SUSP Take 15 mLs by mouth 4 (four) times daily - after meals and at bedtime. 1 Bottle 0  . aspirin 81 MG tablet Take 81 mg by mouth daily.      . cetirizine (ZYRTEC) 10 MG tablet Take 10 mg by mouth daily.    . Cholecalciferol (VITAMIN D3 PO) Take 2,000 Units by mouth daily.    . Dutasteride-Tamsulosin HCl (JALYN) 0.5-0.4 MG CAPS Take by mouth  daily.      . fluticasone (FLONASE) 50 MCG/ACT nasal spray Place 2 sprays into both nostrils daily.    . lansoprazole (PREVACID) 30 MG capsule Take 1 capsule (30 mg total) by mouth daily. 90 capsule 3  . metoprolol succinate (TOPROL-XL) 25 MG 24 hr tablet Take 1 tablet (25 mg total) by mouth daily. 90 tablet 3  . Multiple Vitamin (MULTIVITAMIN) tablet Take 1 tablet by mouth daily.      . nitroGLYCERIN (NITROSTAT) 0.4 MG SL tablet Place 1 tablet (0.4 mg total) under the tongue every 5 (five) minutes as needed. 25 tablet 5  . Omega-3 Fatty Acids (FISH OIL) 1000 MG CPDR Take 1,000 mg by mouth daily.      . simvastatin (ZOCOR) 10 MG tablet Take 1 tablet (10 mg total) by mouth at bedtime. 90 tablet 3   No current facility-administered medications on file prior to visit.   Allergies  Allergen Reactions  . Imdur [Isosorbide Mononitrate]   . Meloxicam     Facial swelling    History   Social History  . Marital Status: Married    Spouse Name: N/A  . Number of Children: N/A  . Years of Education: N/A   Occupational History  .  Not on file.   Social History Main Topics  . Smoking status: Never Smoker   . Smokeless tobacco: Never Used  . Alcohol Use: No  . Drug Use: No  . Sexual Activity: Not on file   Other Topics Concern  . Not on file   Social History Narrative      Review of Systems  All other systems reviewed and are negative.      Objective:   Physical Exam  Constitutional: He appears well-developed and well-nourished.  HENT:  Right Ear: External ear normal.  Left Ear: External ear normal.  Nose: Nose normal.  Mouth/Throat: Oropharynx is clear and moist. No oropharyngeal exudate.  Eyes: Conjunctivae and EOM are normal.  Neck: Neck supple.  Cardiovascular: Normal rate, regular rhythm and normal heart sounds.   No murmur heard. Pulmonary/Chest: Effort normal and breath sounds normal. No respiratory distress. He has no wheezes. He has no rales. He exhibits no  tenderness.  Lymphadenopathy:    He has no cervical adenopathy.  Vitals reviewed.         Assessment & Plan:  Cough  I believe the patient may have aspirated saline while performing the netti pot last evening triggering a coughing spell. At the present time he is completely asymptomatic. He has no signs or symptoms of pneumonia or bronchitis. His physical exam is completely normal. He is in no apparent distress. Pulse oximetry is normal. I reassured the patient that I do not believe he needs any treatment at the present time. Recheck if symptoms return or call me immediately if he develops new symptoms.

## 2015-07-08 ENCOUNTER — Other Ambulatory Visit (INDEPENDENT_AMBULATORY_CARE_PROVIDER_SITE_OTHER): Payer: Medicare HMO | Admitting: *Deleted

## 2015-07-08 DIAGNOSIS — E785 Hyperlipidemia, unspecified: Secondary | ICD-10-CM | POA: Diagnosis not present

## 2015-07-08 DIAGNOSIS — I10 Essential (primary) hypertension: Secondary | ICD-10-CM

## 2015-07-08 LAB — LIPID PANEL
CHOLESTEROL: 97 mg/dL (ref 0–200)
HDL: 34.2 mg/dL — ABNORMAL LOW (ref >39.00)
LDL Cholesterol: 43 mg/dL (ref 0–99)
NonHDL: 63
Total CHOL/HDL Ratio: 3
Triglycerides: 99 mg/dL (ref 0.0–149.0)
VLDL: 19.8 mg/dL (ref 0.0–40.0)

## 2015-07-08 LAB — BASIC METABOLIC PANEL
BUN: 19 mg/dL (ref 6–23)
CHLORIDE: 106 meq/L (ref 96–112)
CO2: 27 mEq/L (ref 19–32)
Calcium: 9.1 mg/dL (ref 8.4–10.5)
Creatinine, Ser: 0.86 mg/dL (ref 0.40–1.50)
GFR: 91.76 mL/min (ref >60.00)
GLUCOSE: 132 mg/dL — AB (ref 70–99)
POTASSIUM: 3.9 meq/L (ref 3.5–5.1)
SODIUM: 141 meq/L (ref 135–145)

## 2015-07-08 LAB — HEPATIC FUNCTION PANEL
ALBUMIN: 3.9 g/dL (ref 3.5–5.2)
ALK PHOS: 54 U/L (ref 39–117)
ALT: 17 U/L (ref 0–53)
AST: 17 U/L (ref 0–37)
BILIRUBIN TOTAL: 0.5 mg/dL (ref 0.2–1.2)
Bilirubin, Direct: 0.1 mg/dL (ref 0.0–0.3)
Total Protein: 6.3 g/dL (ref 6.0–8.3)

## 2015-07-15 ENCOUNTER — Other Ambulatory Visit: Payer: Medicare HMO

## 2015-07-16 ENCOUNTER — Ambulatory Visit (INDEPENDENT_AMBULATORY_CARE_PROVIDER_SITE_OTHER): Payer: Medicare HMO | Admitting: Cardiovascular Disease

## 2015-07-16 ENCOUNTER — Encounter: Payer: Self-pay | Admitting: Cardiovascular Disease

## 2015-07-16 ENCOUNTER — Other Ambulatory Visit: Payer: Self-pay | Admitting: Family Medicine

## 2015-07-16 VITALS — BP 130/70 | HR 72 | Ht 69.0 in | Wt 165.4 lb

## 2015-07-16 DIAGNOSIS — E785 Hyperlipidemia, unspecified: Secondary | ICD-10-CM

## 2015-07-16 DIAGNOSIS — I25119 Atherosclerotic heart disease of native coronary artery with unspecified angina pectoris: Secondary | ICD-10-CM

## 2015-07-16 MED ORDER — SIMVASTATIN 10 MG PO TABS: 10.0000 mg | ORAL_TABLET | Freq: Every day | ORAL | Status: DC

## 2015-07-16 NOTE — Telephone Encounter (Signed)
Medication refilled per protocol. 

## 2015-07-16 NOTE — Progress Notes (Signed)
Andrew [name] Date of Birth  1940-12-19 [facility] [address]    [place], [place]  [address] [phone]  Fax  [phone]   problem list 1. Coronary artery disease 2. Hyperlipidemia 3.  History of Present Illness:  Andrew [name] is a 74 year old gentleman with a history of coronary artery disease. He status post coronary artery bypass grafting. He also has a history of dyslipidemia.  He's recently been having some problems with back pain.  These episodes of back pain would typically occur when he is doing his normal household chores. It would typically occur in the middle of his back and radiate up through to the front of his chest and between the shoulder blades.  This was not similar to his previous episodes of angina prior to his bypass grafting.  He's been exercising on a regular basis and has not had these symptoms with exercise.   Sept. 11. 2014:  [name] is doing ok.  No angina.  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  No   Sept. 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  Raised a garden this years.  No CP. Works out regularly.    Oct. 4, 2016: Doing great.    i reviewed his labs with him.   Everything is stable .  Still raising cattle.  Beef prices are down quite a bit this year.    Current Outpatient Prescriptions on File Prior to Visit  Medication Sig Dispense Refill  . aspirin 81 MG tablet Take 81 mg by mouth daily.      . lansoprazole (PREVACID) 30 MG capsule Take 1 capsule (30 mg total) by mouth daily. 90 capsule 3  . metoprolol succinate (TOPROL-XL) 25 MG 24 hr tablet Take 1 tablet (25 mg total) by mouth daily. 90 tablet 3  . Multiple Vitamin (MULTIVITAMIN) tablet Take 1 tablet by mouth daily.      . nitroGLYCERIN (NITROSTAT) 0.4 MG SL tablet Place 1 tablet (0.4 mg total) under the tongue every 5 (five) minutes as needed. 25 tablet 5  . Omega-3 Fatty Acids (FISH OIL) 1000 MG CPDR Take 1,000 mg by mouth daily.      . simvastatin (ZOCOR) 10  MG tablet Take 1 tablet (10 mg total) by mouth at bedtime. 90 tablet 3  . aluminum hydroxide-magnesium carbonate (GAVISCON) 95-358 MG/15ML SUSP Take 15 mLs by mouth 4 (four) times daily - after meals and at bedtime. (Patient not taking: Reported on 07/16/2015) 1 Bottle 0  . cetirizine (ZYRTEC) 10 MG tablet Take 10 mg by mouth daily.    . Cholecalciferol (VITAMIN D3 PO) Take 2,000 Units by mouth daily.    . Dutasteride-Tamsulosin HCl (JALYN) 0.5-0.4 MG CAPS Take by mouth daily.      . fluticasone (FLONASE) 50 MCG/ACT nasal spray Place 2 sprays into both nostrils daily.     No current facility-administered medications on file prior to visit.    Allergies  Allergen Reactions  . Imdur [Isosorbide Mononitrate]   . Meloxicam     Facial swelling     Past Medical History  Diagnosis Date  . Allergy     grass etc.  . Myocardial infarction   . GERD (gastroesophageal reflux disease)     Barrett's  . Hyperlipidemia   . Hypertension   . Chronic kidney disease     kidney stones  . BPH (benign prostatic hyperplasia)   . Ulcer 1960  . Coronary artery disease   . Barrett's esophagus   .  Chest pain, atypical   . Hernia     Past Surgical History  Procedure Laterality Date  . Coronary artery bypass graft      twice  2 by passes each time  . Cholecystectomy    . Colonoscopy    . Upper gastrointestinal endoscopy    . Inguinal hernia repair      bilateral done twice  . Lithotripsy    . Cardiac catheterization  02/14/2007    MITRAL REGURGITATION. LV NORMAL    History  Smoking status  . Never Smoker   Smokeless tobacco  . Never Used    History  Alcohol Use No    Family History  Problem Relation Age of Onset  . Heart disease Father     Reviw of Systems:  Reviewed in the HPI.  All other systems are negative.  Physical Exam: BP 130/70 mmHg  Pulse 72  Ht 5\' 9"  (1.753 m)  Wt 75.025 kg (165 lb 6.4 oz)  BMI 24.41 kg/m2  SpO2 98% The patient is alert and oriented x 3.  The  mood and affect are normal.   Skin: warm and dry.  Color is normal.    HEENT:   Normal carotids. No JVD.  Lungs: Clear.   Heart: Regular rate. He has no murmurs.    Abdomen: His good bowel sounds. He is no hepatosplenomegaly.  Extremities:  No clubbing cyanosis or edema  Neuro:  Nonfocal. His gait is normal.     ECG: Sept. 28, 2015:  NSR 65.  No ST abn.    Assessment / Plan:    1. Coronary artery disease- he's doing very well. Is not had any episodes of angina. Continue current medications.  2. Hyperlipidemia-   His last lipid levels look quite good. Continue current medications.    Britani Beattie, Wonda Cheng, MD  07/16/2015 3:32 PM    [facility] [address],  [place], [place]  [address] Pager [phone] Phone: [phone]; Fax: [phone]   [facility]  [address] [place], [place]  [address] [phone]   Fax [phone]

## 2015-07-16 NOTE — Patient Instructions (Signed)
Medication Instructions:  Your physician recommends that you continue on your current medications as directed. Please refer to the Current Medication list given to you today.   Labwork: Your physician recommends that you return for lab work in: 1 year on the day of or a few days before your office visit with Dr. Nahser.  You will need to FAST for this appointment - nothing to eat or drink after midnight the night before except water.    Testing/Procedures: None Ordered   Follow-Up: Your physician wants you to follow-up in: 1 year with Dr. Nahser.  You will receive a reminder letter in the mail two months in advance. If you don't receive a letter, please call our office to schedule the follow-up appointment.    

## 2015-07-25 ENCOUNTER — Other Ambulatory Visit: Payer: Self-pay | Admitting: Family Medicine

## 2015-07-25 NOTE — Telephone Encounter (Signed)
Simvastatin was sent to mail order last week

## 2015-08-26 ENCOUNTER — Telehealth: Payer: Self-pay | Admitting: Family Medicine

## 2015-08-26 MED ORDER — METOPROLOL SUCCINATE ER 25 MG PO TB24: 25.0000 mg | ORAL_TABLET | Freq: Every day | ORAL | Status: DC

## 2015-08-26 NOTE — Telephone Encounter (Signed)
Medication called/sent to requested pharmacy  

## 2015-08-30 ENCOUNTER — Other Ambulatory Visit: Payer: Self-pay | Admitting: Family Medicine

## 2015-08-30 ENCOUNTER — Ambulatory Visit (INDEPENDENT_AMBULATORY_CARE_PROVIDER_SITE_OTHER): Payer: Medicare HMO | Admitting: Family Medicine

## 2015-08-30 DIAGNOSIS — Z23 Encounter for immunization: Secondary | ICD-10-CM

## 2015-08-30 DIAGNOSIS — R7303 Prediabetes: Secondary | ICD-10-CM

## 2015-08-30 DIAGNOSIS — I10 Essential (primary) hypertension: Secondary | ICD-10-CM

## 2015-08-30 DIAGNOSIS — E785 Hyperlipidemia, unspecified: Secondary | ICD-10-CM

## 2015-08-30 DIAGNOSIS — Z0000 Encounter for general adult medical examination without abnormal findings: Secondary | ICD-10-CM

## 2015-08-30 DIAGNOSIS — Z79899 Other long term (current) drug therapy: Secondary | ICD-10-CM

## 2015-09-16 ENCOUNTER — Other Ambulatory Visit: Payer: Medicare HMO

## 2015-09-16 DIAGNOSIS — Z0000 Encounter for general adult medical examination without abnormal findings: Secondary | ICD-10-CM | POA: Diagnosis not present

## 2015-09-16 DIAGNOSIS — Z79899 Other long term (current) drug therapy: Secondary | ICD-10-CM

## 2015-09-16 DIAGNOSIS — R7303 Prediabetes: Secondary | ICD-10-CM

## 2015-09-16 DIAGNOSIS — I10 Essential (primary) hypertension: Secondary | ICD-10-CM

## 2015-09-16 DIAGNOSIS — R7309 Other abnormal glucose: Secondary | ICD-10-CM | POA: Diagnosis not present

## 2015-09-16 DIAGNOSIS — E785 Hyperlipidemia, unspecified: Secondary | ICD-10-CM | POA: Diagnosis not present

## 2015-09-16 LAB — COMPLETE METABOLIC PANEL WITH GFR
ALT: 12 U/L (ref 9–46)
AST: 15 U/L (ref 10–35)
Albumin: 4.1 g/dL (ref 3.6–5.1)
Alkaline Phosphatase: 50 U/L (ref 40–115)
BUN: 18 mg/dL (ref 7–25)
CALCIUM: 8.6 mg/dL (ref 8.6–10.3)
CHLORIDE: 100 mmol/L (ref 98–110)
CO2: 30 mmol/L (ref 20–31)
CREATININE: 0.97 mg/dL (ref 0.70–1.18)
GFR, Est African American: 87 mL/min (ref >=60)
GFR, Est Non African American: 76 mL/min (ref >=60)
GLUCOSE: 116 mg/dL — AB (ref 70–99)
POTASSIUM: 4.1 mmol/L (ref 3.5–5.3)
SODIUM: 135 mmol/L (ref 135–146)
Total Bilirubin: 0.6 mg/dL (ref 0.2–1.2)
Total Protein: 6 g/dL — ABNORMAL LOW (ref 6.1–8.1)

## 2015-09-16 LAB — CBC WITH DIFFERENTIAL/PLATELET
BASOS ABS: 0 10*3/uL (ref 0.0–0.1)
Basophils Relative: 0 % (ref 0–1)
EOS ABS: 0.1 10*3/uL (ref 0.0–0.7)
EOS PCT: 2 % (ref 0–5)
HEMATOCRIT: 41.1 % (ref 39.0–52.0)
Hemoglobin: 14.3 g/dL (ref 13.0–17.0)
LYMPHS PCT: 22 % (ref 12–46)
Lymphs Abs: 1.5 10*3/uL (ref 0.7–4.0)
MCH: 30.6 pg (ref 26.0–34.0)
MCHC: 34.8 g/dL (ref 30.0–36.0)
MCV: 87.8 fL (ref 78.0–100.0)
MONO ABS: 0.6 10*3/uL (ref 0.1–1.0)
MPV: 10.2 fL (ref 8.6–12.4)
Monocytes Relative: 9 % (ref 3–12)
Neutro Abs: 4.6 10*3/uL (ref 1.7–7.7)
Neutrophils Relative %: 67 % (ref 43–77)
PLATELETS: 136 10*3/uL — AB (ref 150–400)
RBC: 4.68 MIL/uL (ref 4.22–5.81)
RDW: 13.5 % (ref 11.5–15.5)
WBC: 6.9 10*3/uL (ref 4.0–10.5)

## 2015-09-16 LAB — HEMOGLOBIN A1C
Hgb A1c MFr Bld: 6 % — ABNORMAL HIGH (ref <5.7)
MEAN PLASMA GLUCOSE: 126 mg/dL — AB (ref <117)

## 2015-09-16 LAB — LIPID PANEL
CHOL/HDL RATIO: 3.3 ratio (ref <=5.0)
Cholesterol: 96 mg/dL — ABNORMAL LOW (ref 125–200)
HDL: 29 mg/dL — AB (ref >=40)
LDL CALC: 47 mg/dL (ref <130)
Triglycerides: 98 mg/dL (ref <150)
VLDL: 20 mg/dL (ref <30)

## 2015-09-16 LAB — TSH: TSH: 2.323 u[IU]/mL (ref 0.350–4.500)

## 2015-09-19 ENCOUNTER — Ambulatory Visit (INDEPENDENT_AMBULATORY_CARE_PROVIDER_SITE_OTHER): Payer: Medicare HMO | Admitting: Family Medicine

## 2015-09-19 ENCOUNTER — Encounter: Payer: Self-pay | Admitting: Family Medicine

## 2015-09-19 VITALS — BP 112/64 | HR 74 | Temp 98.3°F | Resp 14 | Ht 69.0 in | Wt 165.0 lb

## 2015-09-19 DIAGNOSIS — Z0000 Encounter for general adult medical examination without abnormal findings: Secondary | ICD-10-CM | POA: Diagnosis not present

## 2015-09-19 MED ORDER — AZELASTINE HCL 0.1 % NA SOLN: 2.0000 | Freq: Two times a day (BID) | NASAL | Status: DC

## 2015-09-19 NOTE — Progress Notes (Signed)
Subjective:    Patient ID: Andrew [name], male    DOB: 01/17/1941, 74 y.o.   MRN: [id number]  HPI  Patient is a very pleasant 74 year old white male who is here today for complete physical exam. His last colonoscopy was in 2012 and was significant for a polyp. Repeat colonoscopy was recommended in 5-10 years. His PSA and digital rectal exam were performed by his urologist and are up-to-date. Patient has had a shingles vaccine. He had Pneumovax 23 and Prevnar 13 in the past. He is also due for a tetanus shot but he declines that today due to cost.   He has had his flu shot.  Does complain of post nasal drip and daily cough and nasal congestion.  Could not tolerate flonase because he states it aggrevated his BPH.  His most recent lab work as listed below: Appointment on 09/16/2015  Component Date Value Ref Range Status  . Sodium 09/16/2015 135  135 - 146 mmol/L Final  . Potassium 09/16/2015 4.1  3.5 - 5.3 mmol/L Final  . Chloride 09/16/2015 100  98 - 110 mmol/L Final  . CO2 09/16/2015 30  20 - 31 mmol/L Final  . Glucose, Bld 09/16/2015 116* 70 - 99 mg/dL Final  . BUN 09/16/2015 18  7 - 25 mg/dL Final  . Creat 09/16/2015 0.97  0.70 - 1.18 mg/dL Final  . Total Bilirubin 09/16/2015 0.6  0.2 - 1.2 mg/dL Final  . Alkaline Phosphatase 09/16/2015 50  40 - 115 U/L Final  . AST 09/16/2015 15  10 - 35 U/L Final  . ALT 09/16/2015 12  9 - 46 U/L Final  . Total Protein 09/16/2015 6.0* 6.1 - 8.1 g/dL Final  . Albumin 09/16/2015 4.1  3.6 - 5.1 g/dL Final  . Calcium 09/16/2015 8.6  8.6 - 10.3 mg/dL Final  . GFR, Est African American 09/16/2015 87  >=60 mL/min Final  . GFR, Est Non African American 09/16/2015 76  >=60 mL/min Final   Comment:   The estimated GFR is a calculation valid for adults (>=28 years old) that uses the CKD-EPI algorithm to adjust for age and sex. It is   not to be used for children, pregnant women, hospitalized patients,    patients on dialysis, or with rapidly changing kidney  function. According to the NKDEP, eGFR >89 is normal, 60-89 shows mild impairment, 30-59 shows moderate impairment, 15-29 shows severe impairment and <15 is ESRD.     [name] TSH 09/16/2015 2.323  0.350 - 4.500 uIU/mL Final  . Cholesterol 09/16/2015 96* 125 - 200 mg/dL Final  . Triglycerides 09/16/2015 98  <150 mg/dL Final  . HDL 09/16/2015 29* >=40 mg/dL Final  . Total CHOL/HDL Ratio 09/16/2015 3.3  <=5.0 Ratio Final  . VLDL 09/16/2015 20  <30 mg/dL Final  . LDL Cholesterol 09/16/2015 47  <130 mg/dL Final   Comment:   Total Cholesterol/HDL Ratio:CHD Risk                        Coronary Heart Disease Risk Table                                        Men       Women          1/2 Average Risk              3.4  3.3              Average Risk              5.0        4.4           2X Average Risk              9.6        7.1           3X Average Risk             23.4       11.0 Use the calculated Patient Ratio above and the CHD Risk table  to determine the patient's CHD Risk.   . WBC 09/16/2015 6.9  4.0 - 10.5 K/uL Final  . RBC 09/16/2015 4.68  4.22 - 5.81 MIL/uL Final  . Hemoglobin 09/16/2015 14.3  13.0 - 17.0 g/dL Final  . HCT 09/16/2015 41.1  39.0 - 52.0 % Final  . MCV 09/16/2015 87.8  78.0 - 100.0 fL Final  . MCH 09/16/2015 30.6  26.0 - 34.0 pg Final  . MCHC 09/16/2015 34.8  30.0 - 36.0 g/dL Final  . RDW 09/16/2015 13.5  11.5 - 15.5 % Final  . Platelets 09/16/2015 136* 150 - 400 K/uL Final  . MPV 09/16/2015 10.2  8.6 - 12.4 fL Final  . Neutrophils Relative % 09/16/2015 67  43 - 77 % Final  . Neutro Abs 09/16/2015 4.6  1.7 - 7.7 K/uL Final  . Lymphocytes Relative 09/16/2015 22  12 - 46 % Final  . Lymphs Abs 09/16/2015 1.5  0.7 - 4.0 K/uL Final  . Monocytes Relative 09/16/2015 9  3 - 12 % Final  . Monocytes Absolute 09/16/2015 0.6  0.1 - 1.0 K/uL Final  . Eosinophils Relative 09/16/2015 2  0 - 5 % Final  . Eosinophils Absolute 09/16/2015 0.1  0.0 - 0.7 K/uL Final  . Basophils  Relative 09/16/2015 0  0 - 1 % Final  . Basophils Absolute 09/16/2015 0.0  0.0 - 0.1 K/uL Final  . Smear Review 09/16/2015 Criteria for review not met   Final  . Hgb A1c MFr Bld 09/16/2015 6.0* <5.7 % Final   Comment:                                                                        According to the ADA Clinical Practice Recommendations for 2011, when HbA1c is used as a screening test:     >=6.5%   Diagnostic of Diabetes Mellitus            (if abnormal result is confirmed)   5.7-6.4%   Increased risk of developing Diabetes Mellitus   References:Diagnosis and Classification of Diabetes Mellitus,Diabetes [id number],9485,46(EVOJJ 1):S62-S69 and Standards of Medical Care in         Diabetes - 2011,Diabetes KKXF,8182,99 (Suppl 1):S11-S61.     . Mean Plasma Glucose 09/16/2015 126* <117 mg/dL Final   Past Medical History  Diagnosis Date  . Allergy     grass etc.  . Myocardial infarction ([place])   . GERD (gastroesophageal reflux disease)     Barrett's  . Hyperlipidemia   . Hypertension   . Chronic kidney disease  kidney stones  . BPH (benign prostatic hyperplasia)   . Ulcer 1960  . Coronary artery disease   . Barrett's esophagus   . Chest pain, atypical   . Hernia    Past Surgical History  Procedure Laterality Date  . Coronary artery bypass graft      twice  2 by passes each time  . Cholecystectomy    . Colonoscopy    . Upper gastrointestinal endoscopy    . Inguinal hernia repair      bilateral done twice  . Lithotripsy    . Cardiac catheterization  02/14/2007    MITRAL REGURGITATION. LV NORMAL   Current Outpatient Prescriptions on File Prior to Visit  Medication Sig Dispense Refill  . aspirin 81 MG tablet Take 81 mg by mouth daily.      . cetirizine (ZYRTEC) 10 MG tablet Take 10 mg by mouth daily.    . Cholecalciferol (VITAMIN D3 PO) Take 2,000 Units by mouth daily.    . Dutasteride-Tamsulosin HCl (JALYN) 0.5-0.4 MG CAPS Take by mouth daily.      . fluticasone  (FLONASE) 50 MCG/ACT nasal spray Place 2 sprays into both nostrils daily.    . lansoprazole (PREVACID) 30 MG capsule Take 1 capsule (30 mg total) by mouth daily. 90 capsule 3  . metoprolol succinate (TOPROL-XL) 25 MG 24 hr tablet Take 1 tablet (25 mg total) by mouth daily. 90 tablet 3  . Multiple Vitamin (MULTIVITAMIN) tablet Take 1 tablet by mouth daily.      . nitroGLYCERIN (NITROSTAT) 0.4 MG SL tablet Place 1 tablet (0.4 mg total) under the tongue every 5 (five) minutes as needed. 25 tablet 5  . Omega-3 Fatty Acids (FISH OIL) 1000 MG CPDR Take 1,000 mg by mouth daily.      . simvastatin (ZOCOR) 10 MG tablet Take 1 tablet (10 mg total) by mouth at bedtime. 90 tablet 0   No current facility-administered medications on file prior to visit.   Allergies  Allergen Reactions  . Imdur [Isosorbide Mononitrate]   . Meloxicam     Facial swelling    Social History   Social History  . Marital Status: Married    Spouse Name: N/A  . Number of Children: N/A  . Years of Education: N/A   Occupational History  . Not on file.   Social History Main Topics  . Smoking status: Never Smoker   . Smokeless tobacco: Never Used  . Alcohol Use: No  . Drug Use: No  . Sexual Activity: Not on file   Other Topics Concern  . Not on file   Social History Narrative   Family History  Problem Relation Age of Onset  . Heart disease Father       Review of Systems  All other systems reviewed and are negative.      Objective:   Physical Exam  Constitutional: He is oriented to person, place, and time. He appears well-developed and well-nourished. No distress.  HENT:  Head: Normocephalic and atraumatic.  Right Ear: External ear normal.  Left Ear: External ear normal.  Nose: Nose normal.  Mouth/Throat: Oropharynx is clear and moist. No oropharyngeal exudate.  Eyes: Conjunctivae and EOM are normal. Pupils are equal, round, and reactive to light. Right eye exhibits no discharge. Left eye exhibits no  discharge. No scleral icterus.  Neck: Normal range of motion. Neck supple. No JVD present. No tracheal deviation present. No thyromegaly present.  Cardiovascular: Normal rate, regular rhythm, normal heart sounds and intact  distal pulses.  Exam reveals no gallop and no friction rub.   No murmur heard. Pulmonary/Chest: Effort normal and breath sounds normal. No stridor. No respiratory distress. He has no wheezes. He has no rales. He exhibits no tenderness.  Abdominal: Soft. Bowel sounds are normal. He exhibits no distension and no mass. There is no tenderness. There is no rebound and no guarding.  Musculoskeletal: Normal range of motion. He exhibits no edema or tenderness.  Lymphadenopathy:    He has no cervical adenopathy.  Neurological: He is alert and oriented to person, place, and time. He has normal reflexes. No cranial nerve deficit. He exhibits normal muscle tone. Coordination normal.  Skin: Skin is warm. No rash noted. He is not diaphoretic. No erythema. No pallor.  Psychiatric: He has a normal mood and affect. His behavior is normal. Judgment and thought content normal.  Vitals reviewed.         Assessment & Plan:  Routine general medical examination at a health care facility   Patient's physical exam is completely normal. His blood pressure is excellent. His colonoscopy and digital rectal exam is up-to-date.all of his immunizations are up-to-date.  He declined the tetanus vaccine.  His blood work is is excellent except for prediabetes and dyslipidemia. I recommended a low carbohydrate diet, regular aerobic exercise. I will recheck his blood sugar and HDL in 6 months. At the present time there is no indication for medication. Regular anticipatory guidance is provided. There is no evidence of depression or dementia. The patient is not a fall risk.  He did have one episode of bright red blood per rectum. There is a trace amount of blood on the toilet tissue after he had a bowel movement.  This only lasted one day. Therefore I performed a rectal exam today. He does have an external hemorrhoid. There are no palpable internal masses. His prostate feels normal. I pressed as deep as I could with digital rectal exam he cannot appreciate any rectal masses.  At the present time I recommended clinical monitoring. I suspect it was due to the hemorrhoid. If the blood persists, I would recommend a colonoscopy.  We will try Astelin 2 sprays twice a day for postnasal drip, allergies, sinus congestion. I believe this is likely causing his headaches and his cough.[name]

## 2015-09-26 ENCOUNTER — Other Ambulatory Visit: Payer: Self-pay | Admitting: Family Medicine

## 2015-09-26 MED ORDER — SIMVASTATIN 10 MG PO TABS: 10.0000 mg | ORAL_TABLET | Freq: Every day | ORAL | Status: DC

## 2015-09-26 NOTE — Telephone Encounter (Signed)
Medication refilled per protocol. 

## 2015-10-08 ENCOUNTER — Ambulatory Visit (INDEPENDENT_AMBULATORY_CARE_PROVIDER_SITE_OTHER): Payer: Medicare HMO | Admitting: Family Medicine

## 2015-10-08 ENCOUNTER — Encounter: Payer: Self-pay | Admitting: Family Medicine

## 2015-10-08 VITALS — BP 132/68 | HR 80 | Temp 98.3°F | Resp 20 | Ht 69.0 in | Wt 166.0 lb

## 2015-10-08 DIAGNOSIS — J069 Acute upper respiratory infection, unspecified: Secondary | ICD-10-CM | POA: Diagnosis not present

## 2015-10-08 MED ORDER — HYDROCODONE-HOMATROPINE 5-1.5 MG/5ML PO SYRP: 5.0000 mL | ORAL_SOLUTION | Freq: Three times a day (TID) | ORAL | Status: DC | PRN

## 2015-10-08 NOTE — Progress Notes (Signed)
Subjective:    Patient ID: Andrew [name], male    DOB: 06/22/1941, 74 y.o.   MRN: [id number]  HPI  Patient symptoms began 4 days ago. Began with a sore throat. Sesamoid progressed rhinorrhea and head congestion with postnasal drip. He now has a tickle cough that is persistent and productive of clear or yellow sputum. He denies any chest pain shortness of breath or dyspnea on exertion. He denies any sinus pain although he does have a dull headache. He denies any fevers or chills. Past Medical History  Diagnosis Date  . Allergy     grass etc.  . Myocardial infarction ([place])   . GERD (gastroesophageal reflux disease)     Barrett's  . Hyperlipidemia   . Hypertension   . Chronic kidney disease     kidney stones  . BPH (benign prostatic hyperplasia)   . Ulcer 1960  . Coronary artery disease   . Barrett's esophagus   . Chest pain, atypical   . Hernia    Past Surgical History  Procedure Laterality Date  . Coronary artery bypass graft      twice  2 by passes each time  . Cholecystectomy    . Colonoscopy    . Upper gastrointestinal endoscopy    . Inguinal hernia repair      bilateral done twice  . Lithotripsy    . Cardiac catheterization  02/14/2007    MITRAL REGURGITATION. LV NORMAL   Current Outpatient Prescriptions on File Prior to Visit  Medication Sig Dispense Refill  . aspirin 81 MG tablet Take 81 mg by mouth daily.      [name] azelastine (ASTELIN) 0.1 % nasal spray Place 2 sprays into both nostrils 2 (two) times daily. Use in each nostril as directed 30 mL 12  . Dutasteride-Tamsulosin HCl (JALYN) 0.5-0.4 MG CAPS Take by mouth daily.      . lansoprazole (PREVACID) 30 MG capsule Take 30 mg by mouth daily.     . metoprolol succinate (TOPROL-XL) 25 MG 24 hr tablet Take 1 tablet (25 mg total) by mouth daily. 90 tablet 3  . Multiple Vitamin (MULTIVITAMIN) tablet Take 1 tablet by mouth daily.      . nitroGLYCERIN (NITROSTAT) 0.4 MG SL tablet Place 1 tablet (0.4 mg total) under the  tongue every 5 (five) minutes as needed. 25 tablet 5  . Omega-3 Fatty Acids (FISH OIL) 1000 MG CPDR Take 1,000 mg by mouth daily.      . simvastatin (ZOCOR) 10 MG tablet Take 1 tablet (10 mg total) by mouth at bedtime. 90 tablet 3   No current facility-administered medications on file prior to visit.   Allergies  Allergen Reactions  . Imdur [Isosorbide Mononitrate]   . Meloxicam     Facial swelling    Social History   Social History  . Marital Status: Married    Spouse Name: N/A  . Number of Children: N/A  . Years of Education: N/A   Occupational History  . Not on file.   Social History Main Topics  . Smoking status: Never Smoker   . Smokeless tobacco: Never Used  . Alcohol Use: No  . Drug Use: No  . Sexual Activity: Not on file   Other Topics Concern  . Not on file   Social History Narrative     Review of Systems  All other systems reviewed and are negative.      Objective:   Physical Exam  Constitutional: He appears well-developed and well-nourished.  HENT:  Right Ear: Tympanic membrane, external ear and ear canal normal.  Left Ear: Tympanic membrane, external ear and ear canal normal.  Nose: Mucosal edema and rhinorrhea present.  Mouth/Throat: Oropharynx is clear and moist. No oropharyngeal exudate.  Eyes: Conjunctivae are normal. Pupils are equal, round, and reactive to light.  Neck: Neck supple.  Cardiovascular: Normal rate, regular rhythm and normal heart sounds.   Pulmonary/Chest: Effort normal and breath sounds normal. No respiratory distress. He has no wheezes. He has no rales. He exhibits no tenderness.  Abdominal: Soft. Bowel sounds are normal. He exhibits no distension. There is no tenderness. There is no rebound and no guarding.  Lymphadenopathy:    He has no cervical adenopathy.  Vitals reviewed.         Assessment & Plan:  URI, acute - Plan: HYDROcodone-homatropine (HYCODAN) 5-1.5 MG/5ML syrup  Patient symptoms are consistent with a  viral upper respiratory infection. I recommended tincture of time for the next 3-4 days. He can use Hycodan 1 teaspoon every 8 hours as needed for cough. He can use Cepacol lozenges for sore throat. He continues Tylenol for headache or fever.

## 2015-10-28 ENCOUNTER — Telehealth: Payer: Self-pay | Admitting: Family Medicine

## 2015-10-28 NOTE — Telephone Encounter (Signed)
I agree

## 2015-10-28 NOTE — Telephone Encounter (Signed)
Pt called to say the person who was filling his pill box had made a mistake.  They left the Jalyn out altogether and gave him two Prevacid a day.  He took this for about a week before he realized the error.  He was concerned if this would have harmed him and did he need to come in and be seen of have lab work.  I reassured him the two a day Prevacid for 1 week would not harm him, go back to the daily dosing.  Be sure to restart his Jalyn.  Be sure to carefully check medications in pill box.

## 2015-10-29 ENCOUNTER — Telehealth: Payer: Self-pay | Admitting: *Deleted

## 2015-10-29 NOTE — Telephone Encounter (Signed)
Received call from patient.   Reports that cough has not improved and thick mucus has gotten worse.   Reports that he has been seen by [facility] for URI.   MD please advise.

## 2015-10-29 NOTE — Telephone Encounter (Signed)
agree

## 2015-10-29 NOTE — Telephone Encounter (Signed)
No fever or other complaints.  Just awoke last night with harsh cough.  Sounded fine over the phone, did not cough once.  Has some DM OTC at home and also a little of the Hycodan from last month.  Told to increased fluids use the OTC or Hycodan (not at the same time).  Gave him appt for Thursday if not better, if better, can call and cancel.

## 2015-10-31 ENCOUNTER — Encounter: Payer: Self-pay | Admitting: Family Medicine

## 2015-10-31 ENCOUNTER — Ambulatory Visit (INDEPENDENT_AMBULATORY_CARE_PROVIDER_SITE_OTHER): Payer: Medicare HMO | Admitting: Family Medicine

## 2015-10-31 VITALS — BP 112/58 | HR 84 | Temp 98.3°F | Resp 14 | Ht 69.0 in | Wt 166.0 lb

## 2015-10-31 DIAGNOSIS — J208 Acute bronchitis due to other specified organisms: Secondary | ICD-10-CM

## 2015-10-31 MED ORDER — AZITHROMYCIN 250 MG PO TABS: ORAL_TABLET | ORAL | Status: DC

## 2015-10-31 NOTE — Progress Notes (Signed)
Subjective:    Patient ID: Andrew [name], male    DOB: [birth year], 75 y.o.   MRN: [id number]  HPI  Patient has had an upper respiratory infection since December 24. Symptoms include rhinorrhea and sinus congestion and postnasal drip. However his cough has persisted now for 3 weeks. It seems to be getting worse. He also reports pleurisy and chest tightness. The cough is productive of brown sputum. He denies any angina. He denies any shortness of breath. He denies any high fevers. Past Medical History  Diagnosis Date  . Allergy     grass etc.  . Myocardial infarction ([place])   . GERD (gastroesophageal reflux disease)     Barrett's  . Hyperlipidemia   . Hypertension   . Chronic kidney disease     kidney stones  . BPH (benign prostatic hyperplasia)   . Ulcer 1960  . Coronary artery disease   . Barrett's esophagus   . Chest pain, atypical   . Hernia    Past Surgical History  Procedure Laterality Date  . Coronary artery bypass graft      twice  2 by passes each time  . Cholecystectomy    . Colonoscopy    . Upper gastrointestinal endoscopy    . Inguinal hernia repair      bilateral done twice  . Lithotripsy    . Cardiac catheterization  02/14/2007    MITRAL REGURGITATION. LV NORMAL   Current Outpatient Prescriptions on File Prior to Visit  Medication Sig Dispense Refill  . aspirin 81 MG tablet Take 81 mg by mouth daily.      [name] azelastine (ASTELIN) 0.1 % nasal spray Place 2 sprays into both nostrils 2 (two) times daily. Use in each nostril as directed 30 mL 12  . Dutasteride-Tamsulosin HCl (JALYN) 0.5-0.4 MG CAPS Take by mouth daily.      [name] HYDROcodone-homatropine (HYCODAN) 5-1.5 MG/5ML syrup Take 5 mLs by mouth every 8 (eight) hours as needed for cough. 120 mL 0  . lansoprazole (PREVACID) 30 MG capsule Take 30 mg by mouth daily.     . metoprolol succinate (TOPROL-XL) 25 MG 24 hr tablet Take 1 tablet (25 mg total) by mouth daily. 90 tablet 3  . Multiple Vitamin (MULTIVITAMIN)  tablet Take 1 tablet by mouth daily.      . nitroGLYCERIN (NITROSTAT) 0.4 MG SL tablet Place 1 tablet (0.4 mg total) under the tongue every 5 (five) minutes as needed. 25 tablet 5  . Omega-3 Fatty Acids (FISH OIL) 1000 MG CPDR Take 1,000 mg by mouth daily.      . simvastatin (ZOCOR) 10 MG tablet Take 1 tablet (10 mg total) by mouth at bedtime. 90 tablet 3   No current facility-administered medications on file prior to visit.   Allergies  Allergen Reactions  . Imdur [Isosorbide Mononitrate]   . Meloxicam     Facial swelling    Social History   Social History  . Marital Status: Married    Spouse Name: N/A  . Number of Children: N/A  . Years of Education: N/A   Occupational History  . Not on file.   Social History Main Topics  . Smoking status: Never Smoker   . Smokeless tobacco: Never Used  . Alcohol Use: No  . Drug Use: No  . Sexual Activity: Not on file   Other Topics Concern  . Not on file   Social History Narrative     Review of Systems  All other systems reviewed  and are negative.      Objective:   Physical Exam  HENT:  Right Ear: External ear normal.  Left Ear: External ear normal.  Nose: Nose normal.  Mouth/Throat: Oropharynx is clear and moist. No oropharyngeal exudate.  Eyes: Conjunctivae are normal.  Neck: Neck supple.  Cardiovascular: Normal rate, regular rhythm and normal heart sounds.   No murmur heard. Pulmonary/Chest: Effort normal and breath sounds normal. No respiratory distress. He has no wheezes. He has no rales.  Abdominal: Soft. Bowel sounds are normal. He exhibits no distension. There is no tenderness. There is no rebound.  Musculoskeletal: He exhibits no edema.  Lymphadenopathy:    He has no cervical adenopathy.  Vitals reviewed.         Assessment & Plan:  Acute bronchitis due to other specified organisms - Plan: azithromycin (ZITHROMAX) 250 MG tablet  Patient appears to have bronchitis. I do believe he has a viral upper  respiratory infection that started the symptoms 3 weeks ago but his symptoms now more consistent with bronchitis and given the persistence for 3 weeks and will treat the patient with a Z-Pak. He can use Hycodan for cough as needed

## 2015-12-04 ENCOUNTER — Other Ambulatory Visit: Payer: Self-pay | Admitting: Physician Assistant

## 2015-12-04 ENCOUNTER — Encounter: Payer: Self-pay | Admitting: Physician Assistant

## 2015-12-04 ENCOUNTER — Ambulatory Visit (INDEPENDENT_AMBULATORY_CARE_PROVIDER_SITE_OTHER): Payer: Medicare HMO | Admitting: Physician Assistant

## 2015-12-04 VITALS — BP 114/54 | HR 80 | Temp 99.5°F | Resp 18 | Wt 168.0 lb

## 2015-12-04 DIAGNOSIS — R509 Fever, unspecified: Secondary | ICD-10-CM

## 2015-12-04 DIAGNOSIS — J111 Influenza due to unidentified influenza virus with other respiratory manifestations: Secondary | ICD-10-CM

## 2015-12-04 DIAGNOSIS — J069 Acute upper respiratory infection, unspecified: Secondary | ICD-10-CM

## 2015-12-04 LAB — INFLUENZA A AND B AG, IMMUNOASSAY
INFLUENZA A ANTIGEN: DETECTED — AB
Influenza B Antigen: NOT DETECTED

## 2015-12-04 MED ORDER — CETIRIZINE HCL 10 MG PO TABS: 10.0000 mg | ORAL_TABLET | Freq: Every day | ORAL | Status: DC

## 2015-12-04 MED ORDER — OSELTAMIVIR PHOSPHATE 75 MG PO CAPS: 75.0000 mg | ORAL_CAPSULE | Freq: Two times a day (BID) | ORAL | Status: DC

## 2015-12-04 MED ORDER — HYDROCODONE-HOMATROPINE 5-1.5 MG/5ML PO SYRP: 5.0000 mL | ORAL_SOLUTION | Freq: Three times a day (TID) | ORAL | Status: DC | PRN

## 2015-12-04 MED ORDER — NITROGLYCERIN 0.4 MG SL SUBL: 0.4000 mg | SUBLINGUAL_TABLET | SUBLINGUAL | Status: DC | PRN

## 2015-12-05 NOTE — Progress Notes (Signed)
Patient ID: Andrew [name] MRN: [id number], DOB: 29-May-1941, 75 y.o. Date of Encounter: 12/05/2015, 7:58 AM    Chief Complaint:  Chief Complaint  Patient presents with  . sick x 3 days    sneezing,cough,fever  wants flu test,  been taking OTC cough med     HPI: 75 y.o. year old white male sits with above symptoms.  States that on Sunday 10/31/15 he developed headache, runny nose, cough. Nose has been stopped up at night every night starting then.  Says that he checked his temperature this morning and was 100.3. Checked it a couple times during the day today and got 99.6.  Says that his cough is a lot worse last night. Continues with some runny nose. Has been using Hycodan and then alternating between an equate brand and r[employer] brand medicine but each of these includes dextromethorphan guaifenesin and phenylephrine.     Home Meds:   Outpatient Prescriptions Prior to Visit  Medication Sig Dispense Refill  . aspirin 81 MG tablet Take 81 mg by mouth daily.      . Dutasteride-Tamsulosin HCl (JALYN) 0.5-0.4 MG CAPS Take by mouth daily.      . lansoprazole (PREVACID) 30 MG capsule Take 30 mg by mouth daily.     . metoprolol succinate (TOPROL-XL) 25 MG 24 hr tablet Take 1 tablet (25 mg total) by mouth daily. 90 tablet 3  . Multiple Vitamin (MULTIVITAMIN) tablet Take 1 tablet by mouth daily.      . Omega-3 Fatty Acids (FISH OIL) 1000 MG CPDR Take 1,000 mg by mouth daily.      . simvastatin (ZOCOR) 10 MG tablet Take 1 tablet (10 mg total) by mouth at bedtime. 90 tablet 3  . HYDROcodone-homatropine (HYCODAN) 5-1.5 MG/5ML syrup Take 5 mLs by mouth every 8 (eight) hours as needed for cough. 120 mL 0  . nitroGLYCERIN (NITROSTAT) 0.4 MG SL tablet Place 1 tablet (0.4 mg total) under the tongue every 5 (five) minutes as needed. 25 tablet 5  . azelastine (ASTELIN) 0.1 % nasal spray Place 2 sprays into both nostrils 2 (two) times daily. Use in each nostril as directed (Patient not taking: Reported  on 12/04/2015) 30 mL 12  . azithromycin (ZITHROMAX) 250 MG tablet 2 tabs poqday1, 1 tab poqday 2-5 6 tablet 0   No facility-administered medications prior to visit.    Allergies:  Allergies  Allergen Reactions  . Imdur [Isosorbide Mononitrate]   . Meloxicam     Facial swelling       Review of Systems: See HPI for pertinent ROS. All other ROS negative.    Physical Exam: Blood pressure 114/54, pulse 80, temperature 99.5 F (37.5 C), temperature source Oral, resp. rate 18, weight 168 lb (76.204 kg)., Body mass index is 24.8 kg/(m^2). General:  WNWD WM. Appears in no acute distress. HEENT: Normocephalic, atraumatic, eyes without discharge, sclera non-icteric, nares are without discharge. Bilateral auditory canals clear, TM's are without perforation, pearly grey and translucent with reflective cone of light bilaterally. Oral cavity moist, posterior pharynx without exudate, erythema, peritonsillar abscess. No tenderness with percussion to frontal or maxillary sinuses.  Neck: Supple. No thyromegaly. No lymphadenopathy. Lungs: Clear bilaterally to auscultation without wheezes, rales, or rhonchi. Breathing is unlabored. Heart: Regular rhythm. No murmurs, rubs, or gallops. Msk:  Strength and tone normal for age. Extremities/Skin: Warm and dry. Neuro: Alert and oriented X 3. Moves all extremities spontaneously. Gait is normal. CNII-XII grossly in tact. Psych:  Responds to questions appropriately  with a normal affect.      ASSESSMENT AND PLAN:  75 y.o. year old male with  1. Influenza Influenza Test Positive.  Discussed with him that the Tamiflu is only effective if started within the first 48 hours and that he is beyond this. However he said that he is willing to pay for the medication and take it if there is any chance at all that it may help. Also reviewed with him that he lives with wife and his grandson. Neither one of them have any symptoms of the flu. They are patients here at our  office so we will prescribe them Tamiflu 75 mg daily 10 days. Patient is to take the Tamiflu as directed below. 75 mg twice a day for 5 days. He is requesting refill on Hycodan so this is refilled. Can continue other medications as needed for symptom relief in the interim. Follow-up if symptoms worsen or if they are not resolving on one week. - oseltamivir (TAMIFLU) 75 MG capsule; Take 1 capsule (75 mg total) by mouth 2 (two) times daily.  Dispense: 10 capsule; Refill: 0  2. Fever and chills - Influenza a and b  3. URI, acute - HYDROcodone-homatropine (HYCODAN) 5-1.5 MG/5ML syrup; Take 5 mLs by mouth every 8 (eight) hours as needed for cough.  Dispense: 120 mL; Refill: 0   Signed, [address] [place], [place], [facility] 12/05/2015 7:58 AM

## 2016-01-03 ENCOUNTER — Encounter: Payer: Self-pay | Admitting: Family Medicine

## 2016-01-03 ENCOUNTER — Ambulatory Visit (INDEPENDENT_AMBULATORY_CARE_PROVIDER_SITE_OTHER): Payer: Medicare HMO | Admitting: Family Medicine

## 2016-01-03 VITALS — BP 142/62 | HR 80 | Temp 98.1°F | Resp 18 | Ht 69.0 in | Wt 168.0 lb

## 2016-01-03 DIAGNOSIS — R0789 Other chest pain: Secondary | ICD-10-CM | POA: Diagnosis not present

## 2016-01-03 NOTE — Progress Notes (Signed)
Subjective:    Patient ID: Andrew [name], male    DOB: 04/25/1941, 75 y.o.   MRN: [id number]  HPI  Patient was in his normal state of health until this morning. He awoke this morning with a burning pain in his stomach just below the xiphoid process. He also has a burning pain in his mid back at approximately the level of T12. The pain is very mild 1-2 on a scale of 10. He ran on a treadmill this morning without any chest pain or shortness of breath. He denies any dyspnea on exertion. He denies any cough or hemoptysis. He does have some postnasal drip and hoarseness. He denies any fever. He denies any pleurisy. He had a CT of the abdomen and pelvis in 2011 that showed no evidence of an abdominal aortic aneurysm. It has not been checked since but I believe this is unlikely to develop in 5 years.  He does have a history of a peptic ulcer however he denies any acid reflux symptoms. Food does not make the pain worse. He has no history of pancreatitis. Past Medical History  Diagnosis Date  . Allergy     grass etc.  . Myocardial infarction ([place])   . GERD (gastroesophageal reflux disease)     Barrett's  . Hyperlipidemia   . Hypertension   . Chronic kidney disease     kidney stones  . BPH (benign prostatic hyperplasia)   . Ulcer 1960  . Coronary artery disease   . Barrett's esophagus   . Chest pain, atypical   . Hernia    Past Surgical History  Procedure Laterality Date  . Coronary artery bypass graft      twice  2 by passes each time  . Cholecystectomy    . Colonoscopy    . Upper gastrointestinal endoscopy    . Inguinal hernia repair      bilateral done twice  . Lithotripsy    . Cardiac catheterization  02/14/2007    MITRAL REGURGITATION. LV NORMAL   Current Outpatient Prescriptions on File Prior to Visit  Medication Sig Dispense Refill  . aspirin 81 MG tablet Take 81 mg by mouth daily.      . Cholecalciferol (VITAMIN D) 2000 units CAPS Take 1 capsule by mouth daily.    .  Dutasteride-Tamsulosin HCl (JALYN) 0.5-0.4 MG CAPS Take by mouth daily.      [name] HYDROcodone-homatropine (HYCODAN) 5-1.5 MG/5ML syrup Take 5 mLs by mouth every 8 (eight) hours as needed for cough. 120 mL 0  . lansoprazole (PREVACID) 30 MG capsule Take 30 mg by mouth daily.     . metoprolol succinate (TOPROL-XL) 25 MG 24 hr tablet Take 1 tablet (25 mg total) by mouth daily. 90 tablet 3  . Multiple Vitamin (MULTIVITAMIN) tablet Take 1 tablet by mouth daily.      . nitroGLYCERIN (NITROSTAT) 0.4 MG SL tablet Place 1 tablet (0.4 mg total) under the tongue every 5 (five) minutes as needed. 25 tablet 5  . Omega-3 Fatty Acids (FISH OIL) 1000 MG CPDR Take 1,000 mg by mouth daily.      . simvastatin (ZOCOR) 10 MG tablet Take 1 tablet (10 mg total) by mouth at bedtime. 90 tablet 3   No current facility-administered medications on file prior to visit.   Allergies  Allergen Reactions  . Imdur [Isosorbide Mononitrate]   . Meloxicam     Facial swelling    Social History   Social History  . Marital Status: Married  Spouse Name: N/A  . Number of Children: N/A  . Years of Education: N/A   Occupational History  . Not on file.   Social History Main Topics  . Smoking status: Never Smoker   . Smokeless tobacco: Never Used  . Alcohol Use: No  . Drug Use: No  . Sexual Activity: Not on file   Other Topics Concern  . Not on file   Social History Narrative     Review of Systems  All other systems reviewed and are negative.      Objective:   Physical Exam  Constitutional: He is oriented to person, place, and time. He appears well-developed and well-nourished.  Neck: Neck supple. No JVD present.  Cardiovascular: Normal rate, regular rhythm and normal heart sounds.  Exam reveals no gallop and no friction rub.   No murmur heard. Pulmonary/Chest: Effort normal and breath sounds normal. No respiratory distress. He has no wheezes. He has no rales.  Abdominal: Soft. Bowel sounds are normal. He  exhibits no distension. There is no tenderness. There is no rebound and no guarding.  Musculoskeletal: He exhibits no edema.  Lymphadenopathy:    He has no cervical adenopathy.  Neurological: He is alert and oriented to person, place, and time. He has normal reflexes. He displays normal reflexes. No cranial nerve deficit. He exhibits normal muscle tone. Coordination normal.  Vitals reviewed.         Assessment & Plan:  Other chest pain - Plan: EKG 12-Lead  EKG shows normal sinus rhythm with normal intervals and normal axis and no evidence of ischemia or infarction. Cardiac and pulmonary exam are completely normal. Abdominal exam is completely normal. I believe the likelihood of pancreatitis with no abdominal pain is unlikely. Mild gastritis or an ulcer is a possibility although this is unlikely given how mild the pain is in the fact the pain is unrelated to food. Is possible that the pain in his back could be just a mild muscle spasm. At this time his history is vague and does not point to a specific cause. However I see no sign or symptom that indicates a life-threatening illness that would require emergency room evaluation. Therefore I have recommended that we monitor the patient over the weekend to see if symptoms persist. If they do persist, we could proceed with a CT scan of the abdomen and pelvis to evaluate further. He is already taking Prevacid for acid reflux. Therefore I will not make any changes at the present time but monitor the patient clinically and determine on obtaining imaging when the patient calls me back on Monday. Should his symptoms worsen dramatically he should go to the emergency room

## 2016-02-28 ENCOUNTER — Encounter: Payer: Self-pay | Admitting: Family Medicine

## 2016-02-28 ENCOUNTER — Ambulatory Visit (INDEPENDENT_AMBULATORY_CARE_PROVIDER_SITE_OTHER): Payer: Medicare HMO | Admitting: Family Medicine

## 2016-02-28 VITALS — BP 142/80 | HR 64 | Temp 97.9°F | Resp 18 | Wt 166.0 lb

## 2016-02-28 DIAGNOSIS — T148 Other injury of unspecified body region: Secondary | ICD-10-CM | POA: Diagnosis not present

## 2016-02-28 DIAGNOSIS — W57XXXA Bitten or stung by nonvenomous insect and other nonvenomous arthropods, initial encounter: Secondary | ICD-10-CM | POA: Diagnosis not present

## 2016-02-28 DIAGNOSIS — S0501XA Injury of conjunctiva and corneal abrasion without foreign body, right eye, initial encounter: Secondary | ICD-10-CM

## 2016-02-28 LAB — COMPLETE METABOLIC PANEL WITH GFR
ALT: 15 U/L (ref 9–46)
AST: 16 U/L (ref 10–35)
Albumin: 4 g/dL (ref 3.6–5.1)
Alkaline Phosphatase: 51 U/L (ref 40–115)
BILIRUBIN TOTAL: 0.6 mg/dL (ref 0.2–1.2)
BUN: 23 mg/dL (ref 7–25)
CHLORIDE: 109 mmol/L (ref 98–110)
CO2: 24 mmol/L (ref 20–31)
CREATININE: 0.87 mg/dL (ref 0.70–1.18)
Calcium: 8.7 mg/dL (ref 8.6–10.3)
GFR, Est African American: >89 mL/min (ref >=60)
GFR, Est Non African American: 83 mL/min (ref >=60)
GLUCOSE: 145 mg/dL — AB (ref 70–99)
Potassium: 4 mmol/L (ref 3.5–5.3)
SODIUM: 143 mmol/L (ref 135–146)
TOTAL PROTEIN: 5.9 g/dL — AB (ref 6.1–8.1)

## 2016-02-28 MED ORDER — POLYMYXIN B-TRIMETHOPRIM 10000-0.1 UNIT/ML-% OP SOLN: 2.0000 [drp] | OPHTHALMIC | Status: DC

## 2016-02-28 NOTE — Progress Notes (Signed)
Subjective:    Patient ID: Andrew [name], male    DOB: November 02, 1940, 75 y.o.   MRN: [id number]  HPI  Patient presents with 2 concerns. First, he has been bitten by several ticks this summer. He is requesting lab work to screen for Lyme disease. He denies any erythema migrans. He denies any flulike symptoms, joint pain, neck stiffness, headaches, rash. Therefore he has no clinical signs or symptoms of Lyme disease or [facility] spotted fever however he was still prefer to have the lab work drawn. He also reports a foreign body sensation in his right eye. The conjunctiva in the right eye lateral to the iris is slightly erythematous. Lids are swept and inverted and no foreign body is seen.  Patient denies any blurry vision. Pupils are equal round reactive to light. It appears he has a mild corneal abrasion Past Medical History  Diagnosis Date  . Allergy     grass etc.  . Myocardial infarction ([place])   . GERD (gastroesophageal reflux disease)     Barrett's  . Hyperlipidemia   . Hypertension   . Chronic kidney disease     kidney stones  . BPH (benign prostatic hyperplasia)   . Ulcer 1960  . Coronary artery disease   . Barrett's esophagus   . Chest pain, atypical   . Hernia    Past Surgical History  Procedure Laterality Date  . Coronary artery bypass graft      twice  2 by passes each time  . Cholecystectomy    . Colonoscopy    . Upper gastrointestinal endoscopy    . Inguinal hernia repair      bilateral done twice  . Lithotripsy    . Cardiac catheterization  02/14/2007    MITRAL REGURGITATION. LV NORMAL   Current Outpatient Prescriptions on File Prior to Visit  Medication Sig Dispense Refill  . aspirin 81 MG tablet Take 81 mg by mouth daily.      . Cholecalciferol (VITAMIN D) 2000 units CAPS Take 1 capsule by mouth daily.    . Dutasteride-Tamsulosin HCl (JALYN) 0.5-0.4 MG CAPS Take by mouth daily.      . lansoprazole (PREVACID) 30 MG capsule Take 30 mg by mouth daily.     .  metoprolol succinate (TOPROL-XL) 25 MG 24 hr tablet Take 1 tablet (25 mg total) by mouth daily. 90 tablet 3  . Multiple Vitamin (MULTIVITAMIN) tablet Take 1 tablet by mouth daily.      . nitroGLYCERIN (NITROSTAT) 0.4 MG SL tablet Place 1 tablet (0.4 mg total) under the tongue every 5 (five) minutes as needed. 25 tablet 5  . Omega-3 Fatty Acids (FISH OIL) 1000 MG CPDR Take 1,000 mg by mouth daily.      . simvastatin (ZOCOR) 10 MG tablet Take 1 tablet (10 mg total) by mouth at bedtime. 90 tablet 3   No current facility-administered medications on file prior to visit.   Allergies  Allergen Reactions  . Imdur [Isosorbide Mononitrate]   . Meloxicam     Facial swelling    Social History   Social History  . Marital Status: Married    Spouse Name: N/A  . Number of Children: N/A  . Years of Education: N/A   Occupational History  . Not on file.   Social History Main Topics  . Smoking status: Never Smoker   . Smokeless tobacco: Never Used  . Alcohol Use: No  . Drug Use: No  . Sexual Activity: Not on file  Other Topics Concern  . Not on file   Social History Narrative     Review of Systems  All other systems reviewed and are negative.      Objective:   Physical Exam  Eyes: EOM and lids are normal. Pupils are equal, round, and reactive to light. Lids are everted and swept, no foreign bodies found. Right eye exhibits no discharge. No foreign body present in the right eye. Left eye exhibits no discharge. Right conjunctiva is injected. Right conjunctiva has no hemorrhage. Left conjunctiva is not injected. No scleral icterus.  Neck: Neck supple.  Cardiovascular: Normal rate, regular rhythm and normal heart sounds.   Pulmonary/Chest: Effort normal and breath sounds normal.  Lymphadenopathy:    He has no cervical adenopathy.  Skin: No rash noted. No erythema.  Vitals reviewed.         Assessment & Plan:  Tick bite - Plan: B. burgdorfi antibodies by WB, [place] spotted  fvr abs pnl(IgG+IgM), COMPLETE METABOLIC PANEL WITH GFR  Corneal abrasion, right, initial encounter - Plan: trimethoprim-polymyxin b (POLYTRIM) ophthalmic solution  In accordance with the patient's wishes I will check the tick bite titers. However he has no clinical signs or symptoms of Lyme disease, [place] spotted fever.  Patient seems to have a corneal abrasion. Begin Polytrim 2 drops every 4-6 hours while awake for the next 5 days. Recheck immediately if worsening

## 2016-03-02 LAB — ROCKY MTN SPOTTED FVR ABS PNL(IGG+IGM)
RMSF IGM: NOT DETECTED
RMSF IgG: NOT DETECTED

## 2016-03-02 LAB — LYME ABY, WSTRN BLT IGG & IGM W/BANDS
B burgdorferi IgG Abs (IB): NEGATIVE
B burgdorferi IgM Abs (IB): NEGATIVE
LYME DISEASE 23 KD IGG: NONREACTIVE
LYME DISEASE 23 KD IGM: NONREACTIVE
LYME DISEASE 39 KD IGM: NONREACTIVE
LYME DISEASE 41 KD IGG: NONREACTIVE
LYME DISEASE 41 KD IGM: NONREACTIVE
LYME DISEASE 45 KD IGG: NONREACTIVE
LYME DISEASE 58 KD IGG: NONREACTIVE
LYME DISEASE 93 KD IGG: NONREACTIVE
Lyme Disease 18 kD IgG: NONREACTIVE
Lyme Disease 28 kD IgG: NONREACTIVE
Lyme Disease 30 kD IgG: NONREACTIVE
Lyme Disease 39 kD IgG: NONREACTIVE
Lyme Disease 66 kD IgG: NONREACTIVE

## 2016-03-05 ENCOUNTER — Other Ambulatory Visit: Payer: Self-pay | Admitting: Family Medicine

## 2016-03-05 ENCOUNTER — Other Ambulatory Visit: Payer: Medicare HMO

## 2016-03-05 DIAGNOSIS — Z79899 Other long term (current) drug therapy: Secondary | ICD-10-CM | POA: Diagnosis not present

## 2016-03-05 DIAGNOSIS — I10 Essential (primary) hypertension: Secondary | ICD-10-CM | POA: Diagnosis not present

## 2016-03-05 DIAGNOSIS — E785 Hyperlipidemia, unspecified: Secondary | ICD-10-CM

## 2016-03-05 LAB — COMPREHENSIVE METABOLIC PANEL
ALK PHOS: 55 U/L (ref 40–115)
ALT: 14 U/L (ref 9–46)
AST: 20 U/L (ref 10–35)
Albumin: 3.9 g/dL (ref 3.6–5.1)
BUN: 18 mg/dL (ref 7–25)
CO2: 25 mmol/L (ref 20–31)
CREATININE: 0.88 mg/dL (ref 0.70–1.18)
Calcium: 8.7 mg/dL (ref 8.6–10.3)
Chloride: 104 mmol/L (ref 98–110)
GLUCOSE: 135 mg/dL — AB (ref 70–99)
Potassium: 4.4 mmol/L (ref 3.5–5.3)
SODIUM: 138 mmol/L (ref 135–146)
TOTAL PROTEIN: 5.9 g/dL — AB (ref 6.1–8.1)
Total Bilirubin: 0.6 mg/dL (ref 0.2–1.2)

## 2016-03-05 LAB — CBC WITH DIFFERENTIAL/PLATELET
BASOS PCT: 1 %
Basophils Absolute: 53 cells/uL (ref 0–200)
EOS ABS: 159 {cells}/uL (ref 15–500)
EOS PCT: 3 %
HCT: 42.2 % (ref 38.5–50.0)
Hemoglobin: 14.3 g/dL (ref 13.0–17.0)
LYMPHS PCT: 26 %
Lymphs Abs: 1378 cells/uL (ref 850–3900)
MCH: 30 pg (ref 27.0–33.0)
MCHC: 33.9 g/dL (ref 32.0–36.0)
MCV: 88.5 fL (ref 80.0–100.0)
MONOS PCT: 9 %
MPV: 10.4 fL (ref 7.5–12.5)
Monocytes Absolute: 477 cells/uL (ref 200–950)
NEUTROS ABS: 3233 {cells}/uL (ref 1500–7800)
Neutrophils Relative %: 61 %
PLATELETS: 131 10*3/uL — AB (ref 140–400)
RBC: 4.77 MIL/uL (ref 4.20–5.80)
RDW: 13.5 % (ref 11.0–15.0)
WBC: 5.3 10*3/uL (ref 3.8–10.8)

## 2016-03-05 LAB — LIPID PANEL
CHOLESTEROL: 101 mg/dL — AB (ref 125–200)
HDL: 36 mg/dL — ABNORMAL LOW (ref >=40)
LDL Cholesterol: 50 mg/dL (ref <130)
Total CHOL/HDL Ratio: 2.8 Ratio (ref <=5.0)
Triglycerides: 74 mg/dL (ref <150)
VLDL: 15 mg/dL (ref <30)

## 2016-03-06 LAB — HEMOGLOBIN A1C
HEMOGLOBIN A1C: 6.3 % — AB (ref <5.7)
Mean Plasma Glucose: 134 mg/dL

## 2016-03-24 ENCOUNTER — Encounter: Payer: Self-pay | Admitting: Family Medicine

## 2016-03-24 ENCOUNTER — Ambulatory Visit (INDEPENDENT_AMBULATORY_CARE_PROVIDER_SITE_OTHER): Payer: Medicare HMO | Admitting: Family Medicine

## 2016-03-24 VITALS — BP 140/60 | HR 62 | Temp 98.1°F | Resp 14 | Ht 69.0 in | Wt 166.0 lb

## 2016-03-24 DIAGNOSIS — I2510 Atherosclerotic heart disease of native coronary artery without angina pectoris: Secondary | ICD-10-CM

## 2016-03-24 DIAGNOSIS — E785 Hyperlipidemia, unspecified: Secondary | ICD-10-CM

## 2016-03-24 DIAGNOSIS — R7303 Prediabetes: Secondary | ICD-10-CM

## 2016-03-24 MED ORDER — NITROGLYCERIN 0.4 MG SL SUBL: 0.4000 mg | SUBLINGUAL_TABLET | SUBLINGUAL | Status: DC | PRN

## 2016-03-24 NOTE — Progress Notes (Signed)
Subjective:    Patient ID: Andrew [name], male    DOB: 01-04-1941, 75 y.o.   MRN: [id number]  HPI Patient is here today for follow-up of his prediabetes. He has a history of prediabetes, coronary artery disease, hypertension, and hyperlipidemia. His blood pressure today is borderline controlled at 140/60. He denies any chest pain shortness of breath or dyspnea on exertion. He exercises every day. He denies any myalgias or right upper quadrant pain. He denies any polyuria polydipsia or blurred vision Past Medical History  Diagnosis Date  . Allergy     grass etc.  . Myocardial infarction ([place])   . GERD (gastroesophageal reflux disease)     Barrett's  . Hyperlipidemia   . Hypertension   . Chronic kidney disease     kidney stones  . BPH (benign prostatic hyperplasia)   . Ulcer 1960  . Coronary artery disease   . Barrett's esophagus   . Chest pain, atypical   . Hernia    Past Surgical History  Procedure Laterality Date  . Coronary artery bypass graft      twice  2 by passes each time  . Cholecystectomy    . Colonoscopy    . Upper gastrointestinal endoscopy    . Inguinal hernia repair      bilateral done twice  . Lithotripsy    . Cardiac catheterization  02/14/2007    MITRAL REGURGITATION. LV NORMAL   Current Outpatient Prescriptions on File Prior to Visit  Medication Sig Dispense Refill  . aspirin 81 MG tablet Take 81 mg by mouth daily.      . Cholecalciferol (VITAMIN D) 2000 units CAPS Take 1 capsule by mouth daily.    . Dutasteride-Tamsulosin HCl (JALYN) 0.5-0.4 MG CAPS Take by mouth daily.      . lansoprazole (PREVACID) 30 MG capsule Take 30 mg by mouth daily.     . metoprolol succinate (TOPROL-XL) 25 MG 24 hr tablet Take 1 tablet (25 mg total) by mouth daily. 90 tablet 3  . Multiple Vitamin (MULTIVITAMIN) tablet Take 1 tablet by mouth daily.      . Omega-3 Fatty Acids (FISH OIL) 1000 MG CPDR Take 1,000 mg by mouth daily.      . simvastatin (ZOCOR) 10 MG tablet Take 1  tablet (10 mg total) by mouth at bedtime. 90 tablet 3  . trimethoprim-polymyxin b (POLYTRIM) ophthalmic solution Place 2 drops into the right eye every 4 (four) hours. 10 mL 0   No current facility-administered medications on file prior to visit.   Allergies  Allergen Reactions  . Imdur [Isosorbide Mononitrate]   . Meloxicam     Facial swelling    Social History   Social History  . Marital Status: Married    Spouse Name: N/A  . Number of Children: N/A  . Years of Education: N/A   Occupational History  . Not on file.   Social History Main Topics  . Smoking status: Never Smoker   . Smokeless tobacco: Never Used  . Alcohol Use: No  . Drug Use: No  . Sexual Activity: Not on file   Other Topics Concern  . Not on file   Social History Narrative      Review of Systems  All other systems reviewed and are negative.      Objective:   Physical Exam  Constitutional: He appears well-developed and well-nourished.  Neck: Neck supple. No thyromegaly present.  Cardiovascular: Normal rate, regular rhythm and normal heart sounds.  No murmur heard. Pulmonary/Chest: Effort normal and breath sounds normal. No respiratory distress. He has no wheezes. He has no rales.  Abdominal: Soft. Bowel sounds are normal. He exhibits no distension. There is no tenderness. There is no rebound and no guarding.  Musculoskeletal: He exhibits no edema.  Lymphadenopathy:    He has no cervical adenopathy.  Vitals reviewed.         Assessment & Plan:  Prediabetes  HLD (hyperlipidemia)  ASCVD (arteriosclerotic cardiovascular disease)  Blood pressures acceptable. Most recent lab work shows an LDL cholesterol was 70, HDL cholesterol has improved. Triglycerides are excellent. Hemoglobin A1c has increased slightly from 6-6.3. We spent 15 minutes discussing low carbohydrate diet. Recheck blood sugar in 6 months

## 2016-04-01 ENCOUNTER — Ambulatory Visit (INDEPENDENT_AMBULATORY_CARE_PROVIDER_SITE_OTHER): Payer: Medicare HMO | Admitting: Physician Assistant

## 2016-04-01 ENCOUNTER — Encounter: Payer: Self-pay | Admitting: Physician Assistant

## 2016-04-01 VITALS — BP 130/78 | HR 72 | Temp 97.9°F | Resp 18 | Wt 168.0 lb

## 2016-04-01 DIAGNOSIS — J309 Allergic rhinitis, unspecified: Secondary | ICD-10-CM

## 2016-04-01 DIAGNOSIS — J040 Acute laryngitis: Secondary | ICD-10-CM | POA: Diagnosis not present

## 2016-04-01 MED ORDER — PREDNISONE 20 MG PO TABS
ORAL_TABLET | ORAL | Status: DC
Start: 2016-04-01 — End: 2016-04-16

## 2016-04-01 MED ORDER — CETIRIZINE HCL 10 MG PO TABS: 10.0000 mg | ORAL_TABLET | Freq: Every day | ORAL | Status: DC

## 2016-04-01 NOTE — Progress Notes (Signed)
Patient ID: Andrew [name] MRN: [id number], DOB: 11-Jan-1941, 75 y.o. Date of Encounter: 04/01/2016, 9:04 AM    Chief Complaint:  Chief Complaint  Patient presents with  . c/o hoarse voice x 5 days    says not sick and no other symptoms     HPI: 75 y.o. year old white male presents with above.   Says that on Saturday night he developed hoarseness. Has continued with hoarseness since. Has gargled with salt water. Has used [employer] brand "Severe Congestion & Cough." Throat has not felt sore at all. Has had no cough, no chest congestion.  Some runny nose--watery clear. No thick, dark mucus.  No fever.  Prior to symptom onset--had cleaned out some buildings and bush-hogged.      Home Meds:   Outpatient Prescriptions Prior to Visit  Medication Sig Dispense Refill  . aspirin 81 MG tablet Take 81 mg by mouth daily.      . Cholecalciferol (VITAMIN D) 2000 units CAPS Take 1 capsule by mouth daily.    . Dutasteride-Tamsulosin HCl (JALYN) 0.5-0.4 MG CAPS Take by mouth daily.      . lansoprazole (PREVACID) 30 MG capsule Take 30 mg by mouth daily.     . metoprolol succinate (TOPROL-XL) 25 MG 24 hr tablet Take 1 tablet (25 mg total) by mouth daily. 90 tablet 3  . Multiple Vitamin (MULTIVITAMIN) tablet Take 1 tablet by mouth daily.      . nitroGLYCERIN (NITROSTAT) 0.4 MG SL tablet Place 1 tablet (0.4 mg total) under the tongue every 5 (five) minutes as needed. 25 tablet 5  . Omega-3 Fatty Acids (FISH OIL) 1000 MG CPDR Take 1,000 mg by mouth daily.      . simvastatin (ZOCOR) 10 MG tablet Take 1 tablet (10 mg total) by mouth at bedtime. 90 tablet 3  . trimethoprim-polymyxin b (POLYTRIM) ophthalmic solution Place 2 drops into the right eye every 4 (four) hours. 10 mL 0   No facility-administered medications prior to visit.    Allergies:  Allergies  Allergen Reactions  . Imdur [Isosorbide Mononitrate]   . Meloxicam     Facial swelling       Review of Systems: See HPI for pertinent  ROS. All other ROS negative.    Physical Exam: Blood pressure 130/78, pulse 72, temperature 97.9 F (36.6 C), temperature source Oral, resp. rate 18, weight 168 lb (76.204 kg)., Body mass index is 24.8 kg/(m^2). General:  Appears in no acute distress. HEENT: Normocephalic, atraumatic, eyes without discharge, sclera non-icteric, nares are without discharge. Bilateral auditory canals with cerumen impaction. Oral cavity moist, posterior pharynx without exudate, erythema, peritonsillar abscess.  Neck: Supple. No thyromegaly. No lymphadenopathy. Lungs: Clear bilaterally to auscultation without wheezes, rales, or rhonchi. Breathing is unlabored. Heart: Regular rhythm. No murmurs, rubs, or gallops. Msk:  Strength and tone normal for age. Extremities/Skin: Warm and dry.  Neuro: Alert and oriented X 3. Moves all extremities spontaneously. Gait is normal. CNII-XII grossly in tact. Psych:  Responds to questions appropriately with a normal affect.     ASSESSMENT AND PLAN:  75 y.o. year old male with  1. Laryngitis Take prednisone taper to treat inflammation. Take Zyrtec to dry up any drainage.  If symptoms not resolving in 1 week, f/u. - predniSONE (DELTASONE) 20 MG tablet; Take 2 daily for 2 days then 1 daily for 2 days then 1/2 daily for 2 days  Dispense: 7 tablet; Refill: 0 - cetirizine (ZYRTEC) 10 MG tablet; Take 1 tablet (  10 mg total) by mouth daily.  Dispense: 30 tablet; Refill: 11  2. Allergic rhinitis, unspecified allergic rhinitis type - predniSONE (DELTASONE) 20 MG tablet; Take 2 daily for 2 days then 1 daily for 2 days then 1/2 daily for 2 days  Dispense: 7 tablet; Refill: 0 - cetirizine (ZYRTEC) 10 MG tablet; Take 1 tablet (10 mg total) by mouth daily.  Dispense: 30 tablet; Refill: 11  3. Bilateral Cerumen Impaction Irrigated in office today.  Told him about otc drops. He is to start using these in ~ 2 weeks then use routinely to prevent re-occurrence.   Signed, [address] [place],  [place], [facility] 04/01/2016 9:04 AM

## 2016-04-03 ENCOUNTER — Ambulatory Visit: Payer: Medicare HMO | Admitting: Family Medicine

## 2016-04-03 DIAGNOSIS — H6122 Impacted cerumen, left ear: Secondary | ICD-10-CM

## 2016-04-03 NOTE — Progress Notes (Signed)
Pt was seen on Wednesday by provider.  Both ears clogged with ear wax at that time.  Able to irrigate right ear but left ear was impacted.  Pt went home and has been putting ear wax softener drops in left ear.  Told to return today for nurse visit to attempt to irrigate left ear.  This time was able ot irrigate left ear.  Removed large amount of ear wax.  Recommended pt use softener drops on regualr basis to help keep ears clear or so wax will be softer if need to irrigate again.[name]

## 2016-04-16 ENCOUNTER — Ambulatory Visit
Admission: RE | Admit: 2016-04-16 | Discharge: 2016-04-16 | Disposition: A | Discharge status: Home / Self Care | Payer: Medicare HMO | Source: Ambulatory Visit | Attending: Family Medicine | Admitting: Family Medicine

## 2016-04-16 ENCOUNTER — Ambulatory Visit (INDEPENDENT_AMBULATORY_CARE_PROVIDER_SITE_OTHER): Payer: Medicare HMO | Admitting: Family Medicine

## 2016-04-16 ENCOUNTER — Encounter: Payer: Self-pay | Admitting: Family Medicine

## 2016-04-16 VITALS — BP 150/70 | HR 62 | Temp 98.3°F | Resp 18 | Ht 69.0 in | Wt 165.0 lb

## 2016-04-16 DIAGNOSIS — R490 Dysphonia: Secondary | ICD-10-CM

## 2016-04-16 DIAGNOSIS — R0989 Other specified symptoms and signs involving the circulatory and respiratory systems: Secondary | ICD-10-CM | POA: Diagnosis not present

## 2016-04-16 MED ORDER — RANITIDINE HCL 300 MG PO TABS: 300.0000 mg | ORAL_TABLET | Freq: Every day | ORAL | Status: DC

## 2016-04-16 NOTE — Progress Notes (Signed)
Subjective:    Patient ID: Andrew [name], male    DOB: [birth year], 75 y.o.   MRN: [id number]  HPI Patient was seen June 21 and diagnosed with laryngitis. He was treated with prednisone. He has been taking allergy medication as well as cold and cough medication for the last 2 weeks. The hoarseness is persisting. He is easy to understand. His voice sounds gravelly in nature. He denies any hemoptysis. He denies any blood-tinged sputum. He denies any postnasal drip or head congestion. He denies any fever. He denies any cough. He does have acid reflux for which he takes Prevacid Past Medical History  Diagnosis Date  . Allergy     grass etc.  . Myocardial infarction ([place])   . GERD (gastroesophageal reflux disease)     Barrett's  . Hyperlipidemia   . Hypertension   . Chronic kidney disease     kidney stones  . BPH (benign prostatic hyperplasia)   . Ulcer 1960  . Coronary artery disease   . Barrett's esophagus   . Chest pain, atypical   . Hernia    Past Surgical History  Procedure Laterality Date  . Coronary artery bypass graft      twice  2 by passes each time  . Cholecystectomy    . Colonoscopy    . Upper gastrointestinal endoscopy    . Inguinal hernia repair      bilateral done twice  . Lithotripsy    . Cardiac catheterization  02/14/2007    MITRAL REGURGITATION. LV NORMAL   Current Outpatient Prescriptions on File Prior to Visit  Medication Sig Dispense Refill  . aspirin 81 MG tablet Take 81 mg by mouth daily.      . cetirizine (ZYRTEC) 10 MG tablet Take 1 tablet (10 mg total) by mouth daily. 30 tablet 11  . Cholecalciferol (VITAMIN D) 2000 units CAPS Take 1 capsule by mouth daily.    . Dutasteride-Tamsulosin HCl (JALYN) 0.5-0.4 MG CAPS Take by mouth daily.      . lansoprazole (PREVACID) 30 MG capsule Take 30 mg by mouth daily.     . metoprolol succinate (TOPROL-XL) 25 MG 24 hr tablet Take 1 tablet (25 mg total) by mouth daily. 90 tablet 3  . Multiple Vitamin (MULTIVITAMIN)  tablet Take 1 tablet by mouth daily.      . nitroGLYCERIN (NITROSTAT) 0.4 MG SL tablet Place 1 tablet (0.4 mg total) under the tongue every 5 (five) minutes as needed. 25 tablet 5  . Omega-3 Fatty Acids (FISH OIL) 1000 MG CPDR Take 1,000 mg by mouth daily.      . simvastatin (ZOCOR) 10 MG tablet Take 1 tablet (10 mg total) by mouth at bedtime. 90 tablet 3  . trimethoprim-polymyxin b (POLYTRIM) ophthalmic solution Place 2 drops into the right eye every 4 (four) hours. 10 mL 0   No current facility-administered medications on file prior to visit.   Allergies  Allergen Reactions  . Imdur [Isosorbide Mononitrate]   . Meloxicam     Facial swelling    Social History   Social History  . Marital Status: Married    Spouse Name: N/A  . Number of Children: N/A  . Years of Education: N/A   Occupational History  . Not on file.   Social History Main Topics  . Smoking status: Never Smoker   . Smokeless tobacco: Never Used  . Alcohol Use: No  . Drug Use: No  . Sexual Activity: Not on file   Other  Topics Concern  . Not on file   Social History Narrative      Review of Systems  All other systems reviewed and are negative.      Objective:   Physical Exam  Constitutional: He appears well-developed and well-nourished. No distress.  HENT:  Right Ear: External ear normal.  Left Ear: External ear normal.  Nose: Nose normal.  Mouth/Throat: Oropharynx is clear and moist. No oropharyngeal exudate.  Eyes: Conjunctivae are normal.  Neck: Neck supple. No tracheal deviation present. No thyromegaly present.  Cardiovascular: Normal rate, regular rhythm and normal heart sounds.   Pulmonary/Chest: Effort normal and breath sounds normal. No stridor.  Lymphadenopathy:    He has no cervical adenopathy.  Skin: He is not diaphoretic.  Vitals reviewed.         Assessment & Plan:  Hoarseness - Plan: DG Chest 2 View, Ambulatory referral to ENT  Patient has been hoarse for 3 weeks. He  requests a chest x-ray as a friend just died from lung cancer and her symptoms began with hoarseness. I tried to reassure the patient that I not feel this was lung cancer. I recommended tincture of time but he would like to see an ENT doctor for laryngoscopy. Meanwhile add Zantac 300 mg daily to his Prevacid as I believe he likely has silent reflux contributing to his hoarseness.

## 2016-04-17 ENCOUNTER — Other Ambulatory Visit: Payer: Self-pay | Admitting: Family Medicine

## 2016-04-17 NOTE — Telephone Encounter (Signed)
Refill appropriate and filled per protocol. 

## 2016-04-21 DIAGNOSIS — N403 Nodular prostate with lower urinary tract symptoms: Secondary | ICD-10-CM | POA: Diagnosis not present

## 2016-04-24 DIAGNOSIS — N403 Nodular prostate with lower urinary tract symptoms: Secondary | ICD-10-CM | POA: Diagnosis not present

## 2016-04-24 DIAGNOSIS — Z87442 Personal history of urinary calculi: Secondary | ICD-10-CM | POA: Diagnosis not present

## 2016-04-24 DIAGNOSIS — R351 Nocturia: Secondary | ICD-10-CM | POA: Diagnosis not present

## 2016-04-24 DIAGNOSIS — M545 Low back pain: Secondary | ICD-10-CM | POA: Diagnosis not present

## 2016-05-25 ENCOUNTER — Telehealth: Payer: Self-pay | Admitting: Cardiovascular Disease

## 2016-05-25 NOTE — Telephone Encounter (Signed)
Called patient about his message. Patient stated he had taken 8 tylenol 500 mg since 10:00 pm yesterday. Informed patient that he should not take more than 4000 mg of Tylenol in a 24 hr period. Patient wanted to know what he could do for pain. Informed patient that he could contact his dentist, who prescribed him antiboitics. Patient does not really want to call his dentist. Informed patient to call his PCP about advisement on another alternative OTC pain medication. Patient not sure if he can take Ibuprofen, due to his prostate. Informed patient to consult his PCP, that they will know the best thing for him to take with his medical history. Patient verbalized understanding.

## 2016-05-25 NOTE — Telephone Encounter (Signed)
New Message:   Pt have an infection from where he had dental work.He have been taking a lot of Tylenol,very concerned. Pt have had open heart surgery twice.

## 2016-05-29 ENCOUNTER — Encounter: Payer: Self-pay | Admitting: Family Medicine

## 2016-05-29 ENCOUNTER — Ambulatory Visit (INDEPENDENT_AMBULATORY_CARE_PROVIDER_SITE_OTHER): Payer: Medicare HMO | Admitting: Family Medicine

## 2016-05-29 VITALS — BP 110/68 | HR 68 | Temp 97.7°F | Resp 16 | Ht 69.0 in | Wt 161.0 lb

## 2016-05-29 DIAGNOSIS — R490 Dysphonia: Secondary | ICD-10-CM

## 2016-05-29 DIAGNOSIS — K0889 Other specified disorders of teeth and supporting structures: Secondary | ICD-10-CM | POA: Diagnosis not present

## 2016-05-29 MED ORDER — OXYCODONE HCL 5 MG PO TABS: 5.0000 mg | ORAL_TABLET | ORAL | 0 refills | Status: DC | PRN

## 2016-05-29 NOTE — Progress Notes (Signed)
Subjective:    Patient ID: Andrew [name], male    DOB: [birth year], 75 y.o.   MRN: [id number]  HPI  04/16/16 Patient was seen June 21 and diagnosed with laryngitis. He was treated with prednisone. He has been taking allergy medication as well as cold and cough medication for the last 2 weeks. The hoarseness is persisting. He is easy to understand. His voice sounds gravelly in nature. He denies any hemoptysis. He denies any blood-tinged sputum. He denies any postnasal drip or head congestion. He denies any fever. He denies any cough. He does have acid reflux for which he takes Prevacid.  AT that time, my plan was: Patient has been hoarse for 3 weeks. He requests a chest x-ray as a friend just died from lung cancer and her symptoms began with hoarseness. I tried to reassure the patient that I not feel this was lung cancer. I recommended tincture of time but he would like to see an ENT doctor for laryngoscopy. Meanwhile add Zantac 300 mg daily to his Prevacid as I believe he likely has silent reflux contributing to his hoarseness.  05/29/16 The chest x-ray was completely clear. However the referral to ENT did not happen for some reason. The order was placed but that I not see where a breakdown in communication occurred. The patient has not seen the ear nose and throat physician. He continues to have hoarseness. It is very mild but his voice frequently breaks during our conversation. He states that the longer he talks the more pronounced the hoarseness becomes in the longer it lasts. He denies any hemoptysis. He denies any hematemesis. He denies any dysphasia or odynophagia. He denies any stridor. He does complain of severe pain in his left lower jaw. His dentist recently has had performed 3 separate procedures to remove a From a tooth. The tooth been reportedly fractured below the gumline. He essentially had to go in to remove bone fragments from the root of the tooth and may need to perform some type of root  canal. The patient has been taking Tylenol up to 3000 mg a day with no relief. He is requesting something for pain he is on antibiotic. There is no evidence of an infection in the mouth. There is no erythema or purulent material in the gum near the operative site. There is no visible swelling in the face and there is no lymphadenopathy in the neck Past Medical History:  Diagnosis Date  . Allergy    grass etc.  . Barrett's esophagus   . BPH (benign prostatic hyperplasia)   . Chest pain, atypical   . Chronic kidney disease    kidney stones  . Coronary artery disease   . GERD (gastroesophageal reflux disease)    Barrett's  . Hernia   . Hyperlipidemia   . Hypertension   . Myocardial infarction ([place])   . Ulcer 1960   Past Surgical History:  Procedure Laterality Date  . CARDIAC CATHETERIZATION  02/14/2007   MITRAL REGURGITATION. LV NORMAL  . CHOLECYSTECTOMY    . COLONOSCOPY    . CORONARY ARTERY BYPASS GRAFT     twice  2 by passes each time  . INGUINAL HERNIA REPAIR     bilateral done twice  . LITHOTRIPSY    . UPPER GASTROINTESTINAL ENDOSCOPY     Current Outpatient Prescriptions on File Prior to Visit  Medication Sig Dispense Refill  . aspirin 81 MG tablet Take 81 mg by mouth daily.      [name]  cetirizine (ZYRTEC) 10 MG tablet Take 1 tablet (10 mg total) by mouth daily. 30 tablet 11  . Cholecalciferol (VITAMIN D) 2000 units CAPS Take 1 capsule by mouth daily.    . Dutasteride-Tamsulosin HCl (JALYN) 0.5-0.4 MG CAPS Take by mouth daily.      . lansoprazole (PREVACID) 30 MG capsule Take 30 mg by mouth daily.     . lansoprazole (PREVACID) 30 MG capsule TAKE 1 CAPSULE DAILY 90 capsule 3  . metoprolol succinate (TOPROL-XL) 25 MG 24 hr tablet Take 1 tablet (25 mg total) by mouth daily. 90 tablet 3  . Multiple Vitamin (MULTIVITAMIN) tablet Take 1 tablet by mouth daily.      . nitroGLYCERIN (NITROSTAT) 0.4 MG SL tablet Place 1 tablet (0.4 mg total) under the tongue every 5 (five) minutes as  needed. 25 tablet 5  . Omega-3 Fatty Acids (FISH OIL) 1000 MG CPDR Take 1,000 mg by mouth daily.      . ranitidine (ZANTAC) 300 MG tablet Take 1 tablet (300 mg total) by mouth at bedtime. 30 tablet 1  . simvastatin (ZOCOR) 10 MG tablet Take 1 tablet (10 mg total) by mouth at bedtime. 90 tablet 3  . trimethoprim-polymyxin b (POLYTRIM) ophthalmic solution Place 2 drops into the right eye every 4 (four) hours. 10 mL 0   No current facility-administered medications on file prior to visit.    Allergies  Allergen Reactions  . Imdur [Isosorbide Mononitrate]   . Meloxicam     Facial swelling    Social History   Social History  . Marital status: Married    Spouse name: N/A  . Number of children: N/A  . Years of education: N/A   Occupational History  . Not on file.   Social History Main Topics  . Smoking status: Never Smoker  . Smokeless tobacco: Never Used  . Alcohol use No  . Drug use: No  . Sexual activity: Not on file   Other Topics Concern  . Not on file   Social History Narrative  . No narrative on file      Review of Systems  All other systems reviewed and are negative.      Objective:   Physical Exam  Constitutional: He appears well-developed and well-nourished. No distress.  HENT:  Right Ear: External ear normal.  Left Ear: External ear normal.  Nose: Nose normal.  Mouth/Throat: Oropharynx is clear and moist. No oropharyngeal exudate.  Eyes: Conjunctivae are normal.  Neck: Neck supple. No tracheal deviation present. No thyromegaly present.  Cardiovascular: Normal rate, regular rhythm and normal heart sounds.   Pulmonary/Chest: Effort normal and breath sounds normal. No stridor.  Lymphadenopathy:    He has no cervical adenopathy.  Skin: He is not diaphoretic.  Vitals reviewed.         Assessment & Plan:  Pain, dental - Plan: oxyCODONE (ROXICODONE) 5 MG immediate release tablet  Hoarseness - Plan: Ambulatory referral to ENT  At this point, the  patient has been hoarse for 8 weeks. He certainly would benefit from an ENT consultation for direct laryngoscopy determine potential causes. I will schedule that. Patient is to call me back in for some reason he does not receive notification regarding an appointment. I will give him oxycodone 5 mg tablets that he can take every 6 hours as needed for severe pain.

## 2016-06-08 ENCOUNTER — Telehealth: Payer: Self-pay | Admitting: Family Medicine

## 2016-06-08 NOTE — Telephone Encounter (Signed)
Called ENT office, don't know what happened to referral from July.  Made him an appt and have made him aware.

## 2016-06-08 NOTE — Telephone Encounter (Signed)
Patient upset that he has not gotten his  appointment for his throat yet, please call and let him know when he can get appt  [phone]

## 2016-06-09 ENCOUNTER — Telehealth: Payer: Self-pay | Admitting: Cardiovascular Disease

## 2016-06-09 DIAGNOSIS — I2510 Atherosclerotic heart disease of native coronary artery without angina pectoris: Secondary | ICD-10-CM

## 2016-06-09 DIAGNOSIS — E785 Hyperlipidemia, unspecified: Secondary | ICD-10-CM

## 2016-06-09 NOTE — Telephone Encounter (Signed)
New Message  Pt call to make appt for ov and lab work. If you dont mind changing the expired date for the lab and attach the order to the appt. Pt is aware of both appt dates. Thank you

## 2016-06-11 NOTE — Telephone Encounter (Signed)
Lab orders updated and linked to lab appointment on 07/29/16

## 2016-06-23 DIAGNOSIS — K219 Gastro-esophageal reflux disease without esophagitis: Secondary | ICD-10-CM | POA: Diagnosis not present

## 2016-06-23 DIAGNOSIS — R490 Dysphonia: Secondary | ICD-10-CM | POA: Diagnosis not present

## 2016-07-06 ENCOUNTER — Telehealth: Payer: Self-pay | Admitting: Family Medicine

## 2016-07-06 IMAGING — CR DG CHEST 2V
2 series · 2 of 2 positions shown · non-contrast
Comparison: March 12, 2009

CLINICAL DATA: Hoarseness and congestion for several days

EXAM:
CHEST  2 VIEW

[w chest pa]
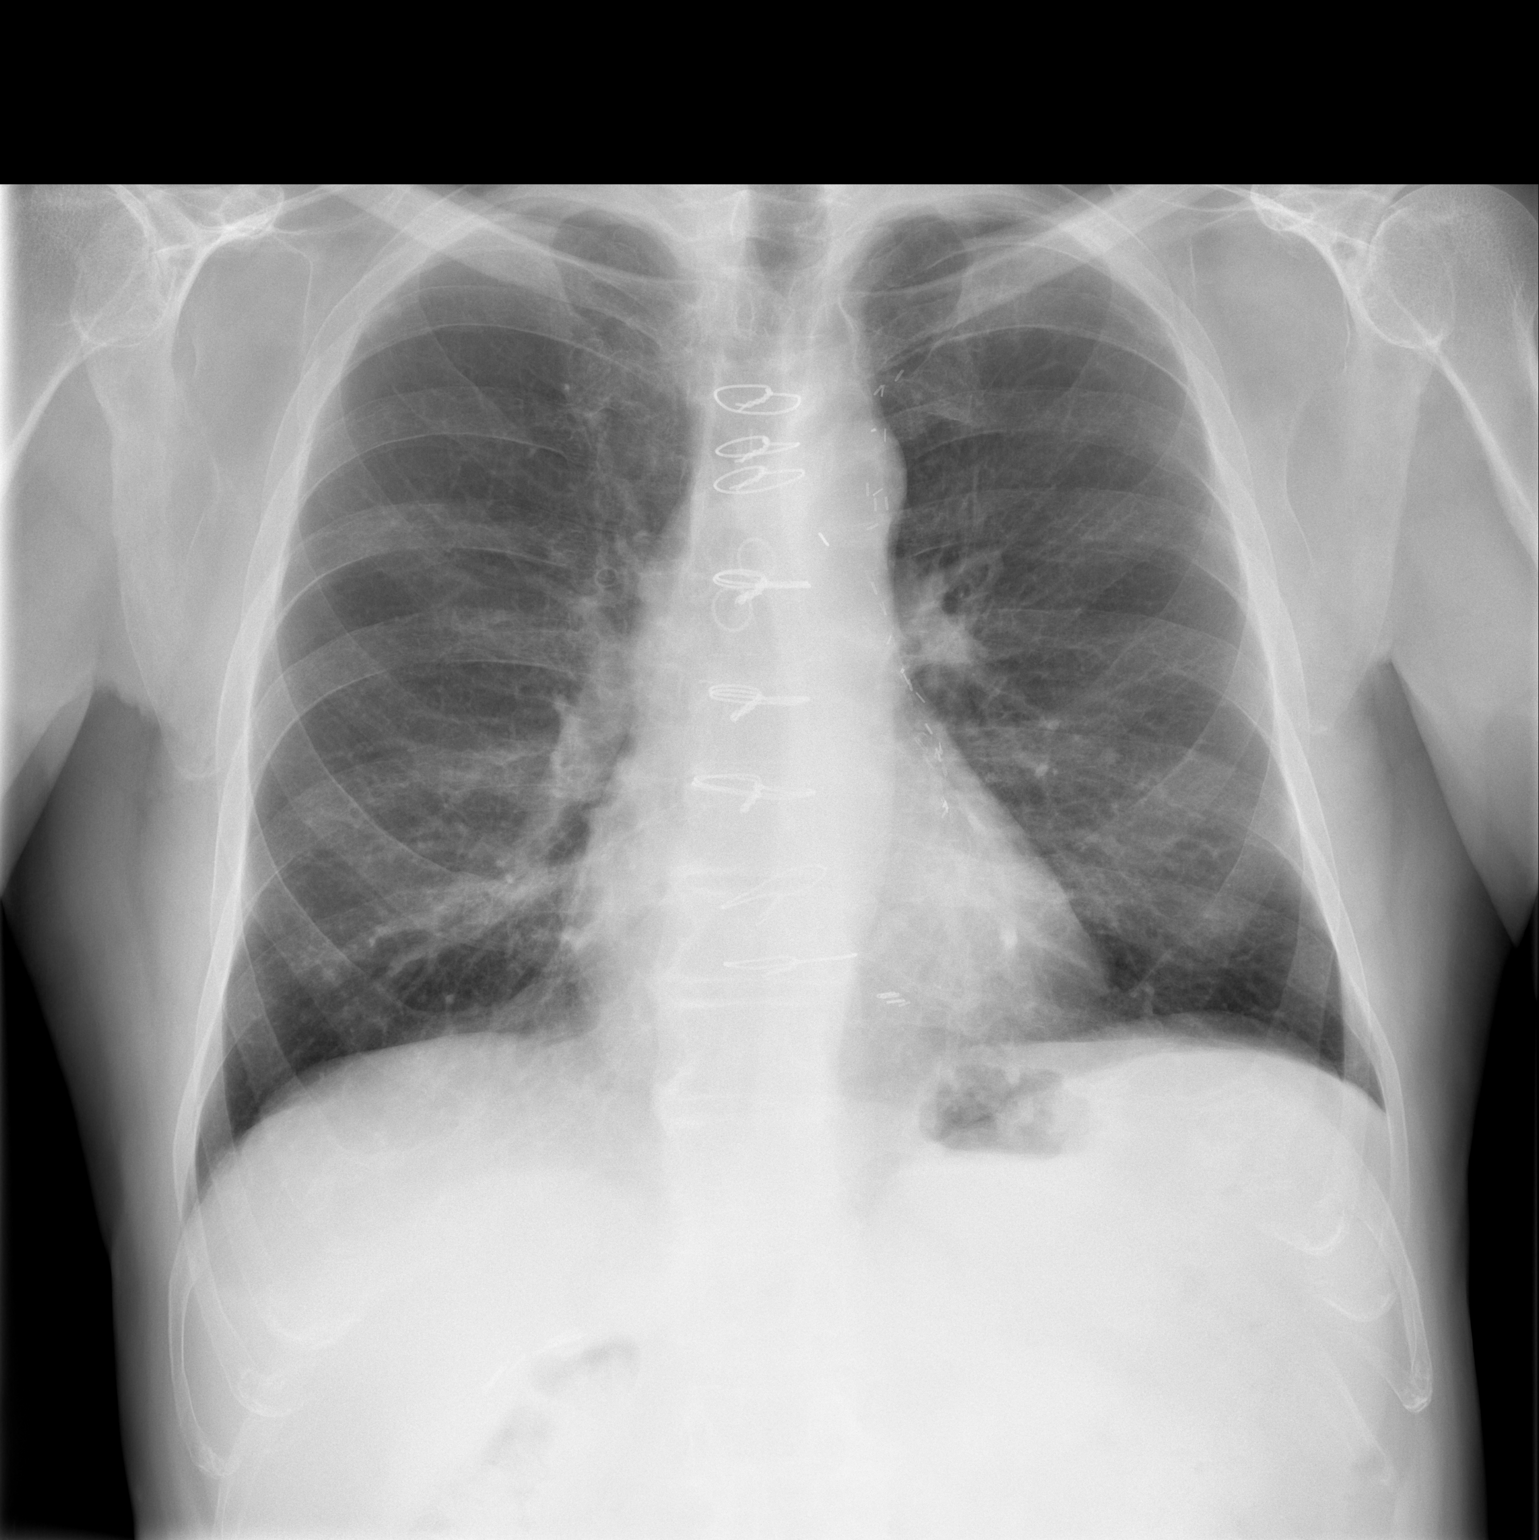

[w chest lat]
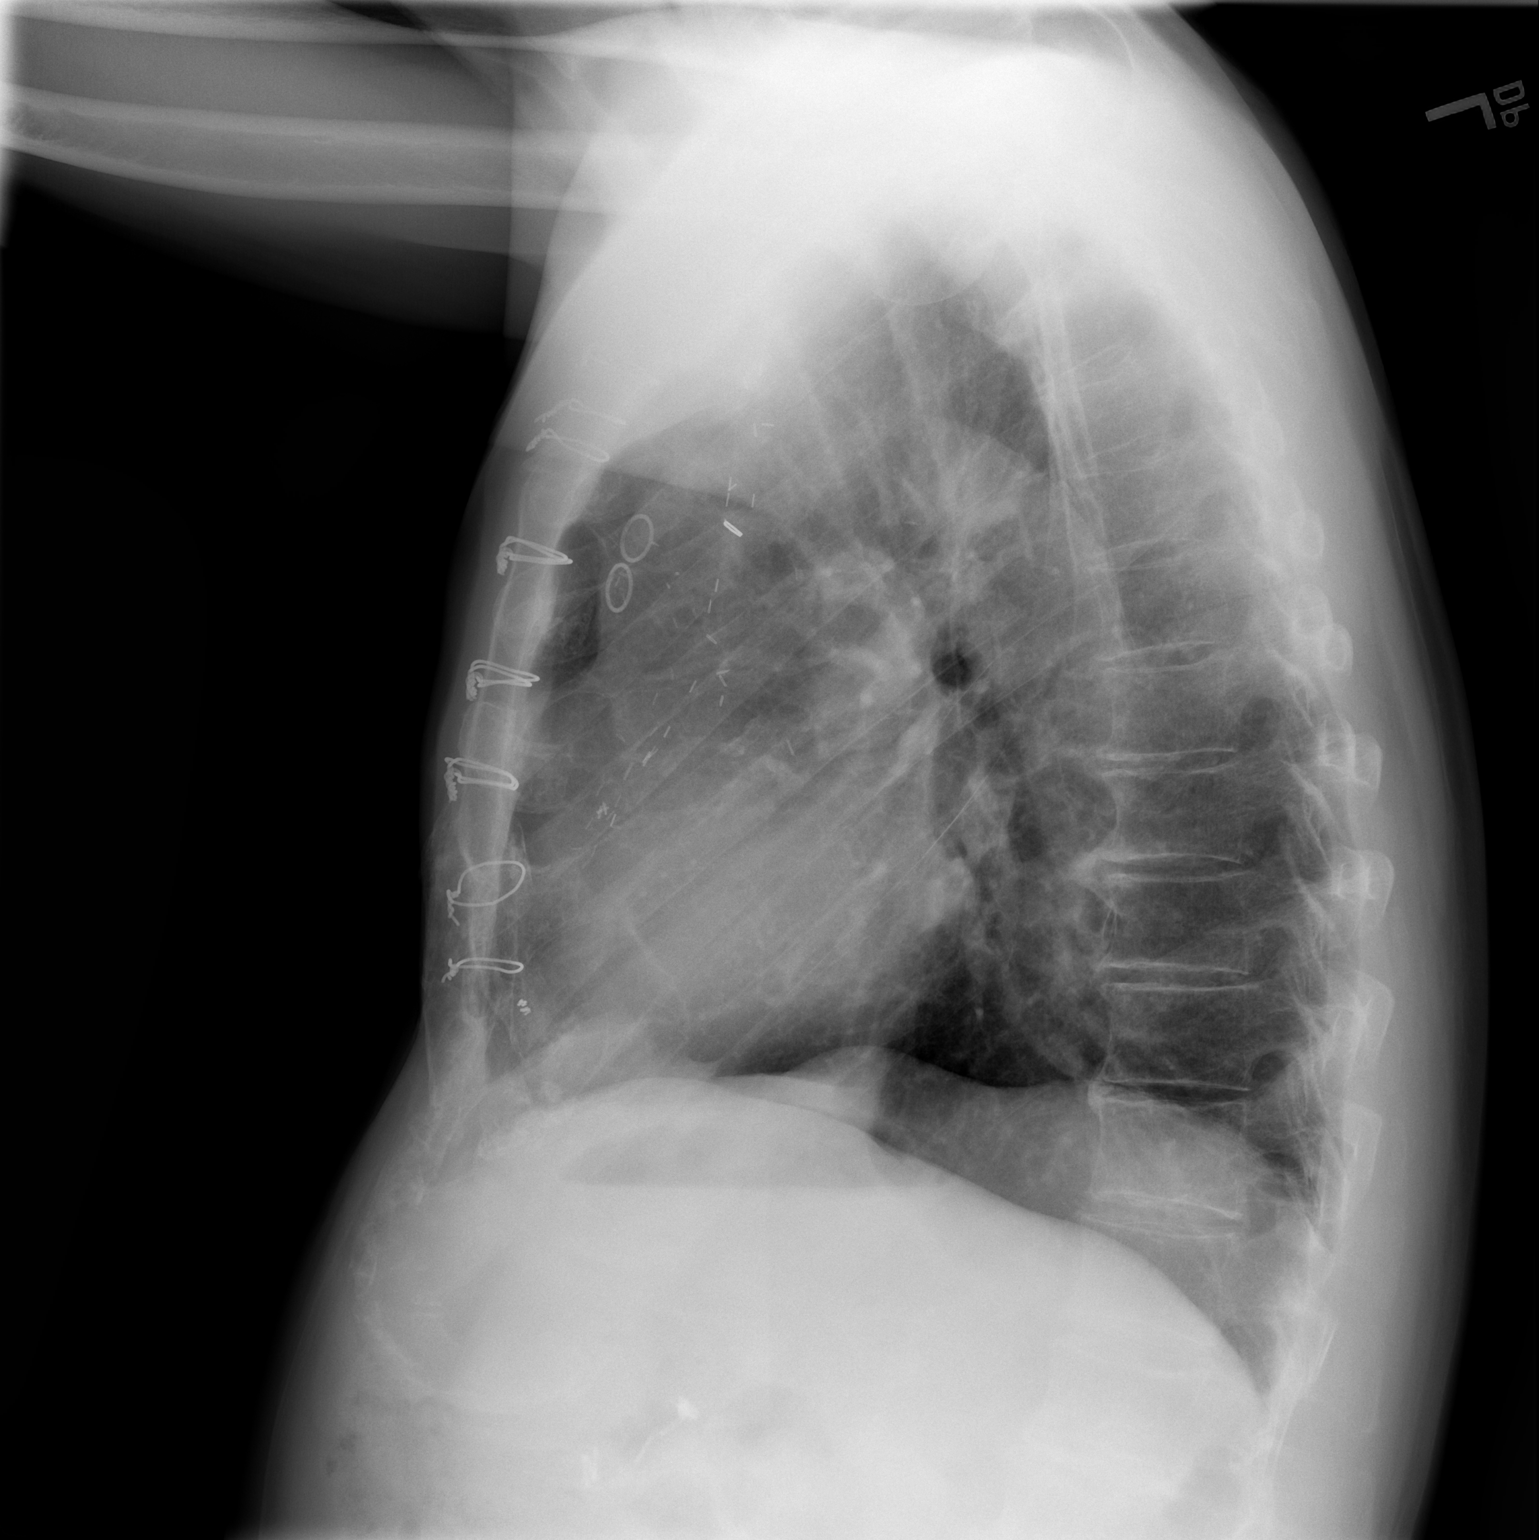

[2 of 2 positions shown; findings below may reference images not displayed]

FINDINGS: There is no edema or consolidation. Heart size and pulmonary
vascularity are normal. Patient is status post coronary artery
bypass grafting. No adenopathy. There is degenerative change in the
thoracic spine.
IMPRESSION: No edema or consolidation. Status post coronary artery bypass
grafting.

## 2016-07-06 MED ORDER — LANSOPRAZOLE 30 MG PO CPDR: 30.0000 mg | DELAYED_RELEASE_CAPSULE | Freq: Every day | ORAL | 3 refills | Status: DC

## 2016-07-06 NOTE — Telephone Encounter (Signed)
[phone] Patient is asking for rx for his lansoprazole to be sent to [facility] pharmacy mail order

## 2016-07-06 NOTE — Telephone Encounter (Signed)
Medication called/sent to requested pharmacy  

## 2016-07-08 ENCOUNTER — Other Ambulatory Visit: Payer: Self-pay | Admitting: Family Medicine

## 2016-07-20 ENCOUNTER — Encounter: Payer: Self-pay | Admitting: Cardiovascular Disease

## 2016-07-23 DIAGNOSIS — D225 Melanocytic nevi of trunk: Secondary | ICD-10-CM | POA: Diagnosis not present

## 2016-07-23 DIAGNOSIS — X32XXXD Exposure to sunlight, subsequent encounter: Secondary | ICD-10-CM | POA: Diagnosis not present

## 2016-07-23 DIAGNOSIS — Z1283 Encounter for screening for malignant neoplasm of skin: Secondary | ICD-10-CM | POA: Diagnosis not present

## 2016-07-23 DIAGNOSIS — L570 Actinic keratosis: Secondary | ICD-10-CM | POA: Diagnosis not present

## 2016-07-23 DIAGNOSIS — C44311 Basal cell carcinoma of skin of nose: Secondary | ICD-10-CM | POA: Diagnosis not present

## 2016-07-29 ENCOUNTER — Other Ambulatory Visit: Payer: Medicare HMO | Admitting: *Deleted

## 2016-07-29 DIAGNOSIS — E785 Hyperlipidemia, unspecified: Secondary | ICD-10-CM | POA: Diagnosis not present

## 2016-07-29 DIAGNOSIS — I2510 Atherosclerotic heart disease of native coronary artery without angina pectoris: Secondary | ICD-10-CM | POA: Diagnosis not present

## 2016-07-29 LAB — COMPREHENSIVE METABOLIC PANEL
ALBUMIN: 4 g/dL (ref 3.6–5.1)
ALK PHOS: 63 U/L (ref 40–115)
ALT: 11 U/L (ref 9–46)
AST: 15 U/L (ref 10–35)
BILIRUBIN TOTAL: 0.7 mg/dL (ref 0.2–1.2)
BUN: 15 mg/dL (ref 7–25)
CHLORIDE: 103 mmol/L (ref 98–110)
CO2: 29 mmol/L (ref 20–31)
CREATININE: 1.03 mg/dL (ref 0.70–1.18)
Calcium: 9.1 mg/dL (ref 8.6–10.3)
Glucose, Bld: 131 mg/dL — ABNORMAL HIGH (ref 65–99)
Potassium: 4.3 mmol/L (ref 3.5–5.3)
SODIUM: 139 mmol/L (ref 135–146)
TOTAL PROTEIN: 5.9 g/dL — AB (ref 6.1–8.1)

## 2016-07-29 LAB — LIPID PANEL
CHOLESTEROL: 95 mg/dL — AB (ref 125–200)
HDL: 30 mg/dL — ABNORMAL LOW (ref >=40)
LDL Cholesterol: 44 mg/dL (ref <130)
TRIGLYCERIDES: 105 mg/dL (ref <150)
Total CHOL/HDL Ratio: 3.2 Ratio (ref <=5.0)
VLDL: 21 mg/dL (ref <30)

## 2016-07-30 DIAGNOSIS — L905 Scar conditions and fibrosis of skin: Secondary | ICD-10-CM | POA: Diagnosis not present

## 2016-07-30 DIAGNOSIS — D485 Neoplasm of uncertain behavior of skin: Secondary | ICD-10-CM | POA: Diagnosis not present

## 2016-07-31 ENCOUNTER — Ambulatory Visit: Payer: Medicare HMO | Admitting: Cardiovascular Disease

## 2016-08-04 ENCOUNTER — Encounter: Payer: Self-pay | Admitting: Cardiovascular Disease

## 2016-08-04 ENCOUNTER — Ambulatory Visit (INDEPENDENT_AMBULATORY_CARE_PROVIDER_SITE_OTHER): Payer: Medicare HMO | Admitting: Cardiovascular Disease

## 2016-08-04 VITALS — BP 110/50 | HR 57 | Ht 69.0 in | Wt 163.2 lb

## 2016-08-04 DIAGNOSIS — I739 Peripheral vascular disease, unspecified: Secondary | ICD-10-CM

## 2016-08-04 DIAGNOSIS — I2510 Atherosclerotic heart disease of native coronary artery without angina pectoris: Secondary | ICD-10-CM | POA: Diagnosis not present

## 2016-08-04 DIAGNOSIS — I779 Disorder of arteries and arterioles, unspecified: Secondary | ICD-10-CM | POA: Diagnosis not present

## 2016-08-04 MED ORDER — NITROGLYCERIN 0.4 MG SL SUBL: 0.4000 mg | SUBLINGUAL_TABLET | SUBLINGUAL | 6 refills | Status: DC | PRN

## 2016-08-04 NOTE — Patient Instructions (Signed)
Medication Instructions:  Your physician recommends that you continue on your current medications as directed. Please refer to the Current Medication list given to you today.   Labwork: None Ordered   Testing/Procedures: Your physician has requested that you have a carotid duplex in 1 year before your appointment with Dr. Acie Fredrickson. This test is an ultrasound of the carotid arteries in your neck. It looks at blood flow through these arteries that supply the brain with blood. Allow one hour for this exam. There are no restrictions or special instructions.   Follow-Up: Your physician wants you to follow-up in: 1 year with Dr. Acie Fredrickson.  You will receive a reminder letter in the mail two months in advance. If you don't receive a letter, please call our office to schedule the follow-up appointment.   If you need a refill on your cardiac medications before your next appointment, please call your pharmacy.   Thank you for choosing [facility]! Christen Bame, RN [phone]

## 2016-08-04 NOTE — Progress Notes (Signed)
Andrew [name] Date of Birth  22-Aug-1941 [facility] [address]    [place], [place]  [address] [phone]  Fax  [phone]   problem list 1. Coronary artery disease 2. Hyperlipidemia 3.  Previous notes.   Andrew [name] is a 75 year old gentleman with a history of coronary artery disease. He status post coronary artery bypass grafting. He also has a history of dyslipidemia.  He's recently been having some problems with back pain.  These episodes of back pain would typically occur when he is doing his normal household chores. It would typically occur in the middle of his back and radiate up through to the front of his chest and between the shoulder blades.  This was not similar to his previous episodes of angina prior to his bypass grafting.  He's been exercising on a regular basis and has not had these symptoms with exercise.   Sept. 11. 2014:  Andrew [name] is doing ok.  No angina.  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  No   Sept. 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  Raised a garden this years.  No CP. Works out regularly.    Oct. 4, 2016: Doing great.    i reviewed his labs with him.   Everything is stable .  Still raising cattle.  Beef prices are down quite a bit this year.    Oct. 24, 2017:   Doing well Still raising cows.    No CP .    Current Outpatient Prescriptions on File Prior to Visit  Medication Sig Dispense Refill  . aspirin 81 MG tablet Take 81 mg by mouth daily.      . Cholecalciferol (VITAMIN D) 2000 units CAPS Take 1 capsule by mouth daily.    . Dutasteride-Tamsulosin HCl (JALYN) 0.5-0.4 MG CAPS Take by mouth daily.      . lansoprazole (PREVACID) 30 MG capsule Take 30 mg by mouth daily.     . metoprolol succinate (TOPROL-XL) 25 MG 24 hr tablet take 1 tablet by mouth once daily 90 tablet 3  . Multiple Vitamin (MULTIVITAMIN) tablet Take 1 tablet by mouth daily.      . nitroGLYCERIN (NITROSTAT) 0.4 MG SL tablet Place 1 tablet (0.4 mg  total) under the tongue every 5 (five) minutes as needed. 25 tablet 5  . Omega-3 Fatty Acids (FISH OIL) 1000 MG CPDR Take 1,000 mg by mouth daily.      . simvastatin (ZOCOR) 10 MG tablet Take 1 tablet (10 mg total) by mouth at bedtime. 90 tablet 3   No current facility-administered medications on file prior to visit.     Allergies  Allergen Reactions  . Imdur [Isosorbide Mononitrate]     Unknown, per pt   . Meloxicam     Facial swelling     Past Medical History:  Diagnosis Date  . Allergy    grass etc.  . Barrett's esophagus   . BPH (benign prostatic hyperplasia)   . Chest pain, atypical   . Chronic kidney disease    kidney stones  . Coronary artery disease   . GERD (gastroesophageal reflux disease)    Barrett's  . Hernia   . Hyperlipidemia   . Hypertension   . Myocardial infarction   . Ulcer ([place]) 1960    Past Surgical History:  Procedure Laterality Date  . CARDIAC CATHETERIZATION  02/14/2007   MITRAL REGURGITATION. LV NORMAL  . CHOLECYSTECTOMY    . COLONOSCOPY    .  CORONARY ARTERY BYPASS GRAFT     twice  2 by passes each time  . INGUINAL HERNIA REPAIR     bilateral done twice  . LITHOTRIPSY    . UPPER GASTROINTESTINAL ENDOSCOPY      History  Smoking Status  . Never Smoker  Smokeless Tobacco  . Never Used    History  Alcohol Use No    Family History  Problem Relation Age of Onset  . Heart disease Father     Reviw of Systems:  Reviewed in the HPI.  All other systems are negative.  Physical Exam: BP (!) 110/50   Pulse (!) 57   Ht 5\' 9"  (1.753 m)   Wt 163 lb 3.2 oz (74 kg)   SpO2 96%   BMI 24.10 kg/m  The patient is alert and oriented x 3.  The mood and affect are normal.   Skin: warm and dry.  Color is normal.    HEENT:   Normal carotids. No JVD.  Lungs: Clear.   Heart: Regular rate. He has no murmurs.    Abdomen: His good bowel sounds. He is no hepatosplenomegaly.  Extremities:  No clubbing cyanosis or edema  Neuro:  Nonfocal.  His gait is normal.     ECG: Sept. 28, 2015:  NSR 65.  No ST abn.    Assessment / Plan:    1. Coronary artery disease- he's doing very well. Is not had any episodes of angina. Continue current medications.  2. Hyperlipidemia-   His last lipid levels look quite good. Continue current medications.  3. Carotid artery disease:   Had a lifeline screening .   Was found to have mild bilateral Carotid artery disease  Will repeat in a year    Andrew Moores, MD  08/04/2016 2:54 PM    [address] [place],  [place], [place]  [address] Pager [phone] Phone: [phone]; Fax: [phone]   [facility]  [address] [place], [place]  [address] [phone]   Fax [phone]

## 2016-08-21 ENCOUNTER — Encounter: Payer: Self-pay | Admitting: Family Medicine

## 2016-08-21 ENCOUNTER — Ambulatory Visit (INDEPENDENT_AMBULATORY_CARE_PROVIDER_SITE_OTHER): Payer: Medicare HMO | Admitting: Family Medicine

## 2016-08-21 VITALS — BP 100/62 | HR 74 | Temp 98.3°F | Resp 14 | Ht 69.0 in | Wt 161.0 lb

## 2016-08-21 DIAGNOSIS — E7800 Pure hypercholesterolemia, unspecified: Secondary | ICD-10-CM | POA: Diagnosis not present

## 2016-08-21 DIAGNOSIS — R7303 Prediabetes: Secondary | ICD-10-CM | POA: Diagnosis not present

## 2016-08-21 DIAGNOSIS — Z23 Encounter for immunization: Secondary | ICD-10-CM

## 2016-08-21 DIAGNOSIS — I2510 Atherosclerotic heart disease of native coronary artery without angina pectoris: Secondary | ICD-10-CM | POA: Diagnosis not present

## 2016-08-21 NOTE — Progress Notes (Signed)
Subjective:    Patient ID: Andrew [name], male    DOB: [birth year], 75 y.o.   MRN: [id number]  HPI Patient is here today for follow-up of his prediabetes. He has a history of prediabetes, coronary artery disease, hypertension, and hyperlipidemia. His blood pressure today is WELL CONTROLLED. He denies any chest pain shortness of breath or dyspnea on exertion. He exercises every day. He denies any myalgias or right upper quadrant pain. He denies any polyuria polydipsia or blurred vision.  His recent lab work from October is listed below: Lab on 07/29/2016  Component Date Value Ref Range Status  . Sodium 07/29/2016 139  135 - 146 mmol/L Final  . Potassium 07/29/2016 4.3  3.5 - 5.3 mmol/L Final  . Chloride 07/29/2016 103  98 - 110 mmol/L Final  . CO2 07/29/2016 29  20 - 31 mmol/L Final  . Glucose, Bld 07/29/2016 131* 65 - 99 mg/dL Final  . BUN 07/29/2016 15  7 - 25 mg/dL Final  . Creat 07/29/2016 1.03  0.70 - 1.18 mg/dL Final   Comment:   For patients > or = 77 years of age: The upper reference limit for Creatinine is approximately 13% higher for people identified as African-American.     . Total Bilirubin 07/29/2016 0.7  0.2 - 1.2 mg/dL Final  . Alkaline Phosphatase 07/29/2016 63  40 - 115 U/L Final  . AST 07/29/2016 15  10 - 35 U/L Final  . ALT 07/29/2016 11  9 - 46 U/L Final  . Total Protein 07/29/2016 5.9* 6.1 - 8.1 g/dL Final  . Albumin 07/29/2016 4.0  3.6 - 5.1 g/dL Final  . Calcium 07/29/2016 9.1  8.6 - 10.3 mg/dL Final  . Cholesterol 07/29/2016 95* 125 - 200 mg/dL Final  . Triglycerides 07/29/2016 105  <150 mg/dL Final  . HDL 07/29/2016 30* >=40 mg/dL Final  . Total CHOL/HDL Ratio 07/29/2016 3.2  <=5.0 Ratio Final  . VLDL 07/29/2016 21  <30 mg/dL Final  . LDL Cholesterol 07/29/2016 44  <130 mg/dL Final   Comment:   Total Cholesterol/HDL Ratio:CHD Risk                        Coronary Heart Disease Risk Table                                        Men       Women          1/2  Average Risk              3.4        3.3              Average Risk              5.0        4.4           2X Average Risk              9.6        7.1           3X Average Risk             23.4       11.0 Use the calculated Patient Ratio above and the CHD Risk table  to determine the patient's CHD Risk.     Past Medical History:  Diagnosis Date  . Allergy    grass etc.  . Barrett's esophagus   . BPH (benign prostatic hyperplasia)   . Chest pain, atypical   . Chronic kidney disease    kidney stones  . Coronary artery disease   . GERD (gastroesophageal reflux disease)    Barrett's  . Hernia   . Hyperlipidemia   . Hypertension   . Myocardial infarction   . Ulcer ([place]) 1960   Past Surgical History:  Procedure Laterality Date  . CARDIAC CATHETERIZATION  02/14/2007   MITRAL REGURGITATION. LV NORMAL  . CHOLECYSTECTOMY    . COLONOSCOPY    . CORONARY ARTERY BYPASS GRAFT     twice  2 by passes each time  . INGUINAL HERNIA REPAIR     bilateral done twice  . LITHOTRIPSY    . UPPER GASTROINTESTINAL ENDOSCOPY     Current Outpatient Prescriptions on File Prior to Visit  Medication Sig Dispense Refill  . aspirin 81 MG tablet Take 81 mg by mouth daily.      . Cholecalciferol (VITAMIN D) 2000 units CAPS Take 1 capsule by mouth daily.    . Dutasteride-Tamsulosin HCl (JALYN) 0.5-0.4 MG CAPS Take by mouth daily.      . lansoprazole (PREVACID) 30 MG capsule Take 30 mg by mouth daily.     . metoprolol succinate (TOPROL-XL) 25 MG 24 hr tablet take 1 tablet by mouth once daily 90 tablet 3  . Multiple Vitamin (MULTIVITAMIN) tablet Take 1 tablet by mouth daily.      . nitroGLYCERIN (NITROSTAT) 0.4 MG SL tablet Place 1 tablet (0.4 mg total) under the tongue every 5 (five) minutes as needed. 25 tablet 6  . Omega-3 Fatty Acids (FISH OIL) 1000 MG CPDR Take 1,000 mg by mouth daily.      . simvastatin (ZOCOR) 10 MG tablet Take 1 tablet (10 mg total) by mouth at bedtime. 90 tablet 3   No current  facility-administered medications on file prior to visit.    Allergies  Allergen Reactions  . Imdur [Isosorbide Mononitrate]     Unknown, per pt   . Meloxicam     Facial swelling    Social History   Social History  . Marital status: Married    Spouse name: N/A  . Number of children: N/A  . Years of education: N/A   Occupational History  . Not on file.   Social History Main Topics  . Smoking status: Never Smoker  . Smokeless tobacco: Never Used  . Alcohol use No  . Drug use: No  . Sexual activity: Not on file   Other Topics Concern  . Not on file   Social History Narrative  . No narrative on file      Review of Systems  All other systems reviewed and are negative.      Objective:   Physical Exam  Constitutional: He appears well-developed and well-nourished.  Neck: Neck supple. No thyromegaly present.  Cardiovascular: Normal rate, regular rhythm and normal heart sounds.   No murmur heard. Pulmonary/Chest: Effort normal and breath sounds normal. No respiratory distress. He has no wheezes. He has no rales.  Abdominal: Soft. Bowel sounds are normal. He exhibits no distension. There is no tenderness. There is no rebound and no guarding.  Musculoskeletal: He exhibits no edema.  Lymphadenopathy:    He has no cervical adenopathy.  Vitals reviewed.         Assessment & Plan:  Pure hypercholesterolemia  ASCVD (arteriosclerotic cardiovascular disease)  Prediabetes  Needs flu shot - Plan: Flu Vaccine QUAD 36+ mos IM  Blood pressure is acceptable. I spent 30 minutes discussing dietary changes with the patient as well as lifestyle changes to help address his blood sugars. I recommended less than 45 g) Mobile. We also went over some online tools and how to determine the carbohydrate content of the foods that he is eating. I recommended aerobic exercise to help address his low HDL cholesterol. His LDL cholesterol is excellent. He received his flu shot today

## 2016-09-02 DIAGNOSIS — K219 Gastro-esophageal reflux disease without esophagitis: Secondary | ICD-10-CM | POA: Diagnosis not present

## 2016-09-02 DIAGNOSIS — R490 Dysphonia: Secondary | ICD-10-CM | POA: Diagnosis not present

## 2016-09-07 ENCOUNTER — Encounter: Payer: Self-pay | Admitting: Family Medicine

## 2016-09-07 ENCOUNTER — Ambulatory Visit (INDEPENDENT_AMBULATORY_CARE_PROVIDER_SITE_OTHER): Payer: Medicare HMO | Admitting: Family Medicine

## 2016-09-07 VITALS — BP 136/70 | HR 68 | Temp 98.4°F | Resp 14 | Ht 69.0 in | Wt 161.0 lb

## 2016-09-07 DIAGNOSIS — M5010 Cervical disc disorder with radiculopathy, unspecified cervical region: Secondary | ICD-10-CM | POA: Diagnosis not present

## 2016-09-07 MED ORDER — PREDNISONE 20 MG PO TABS: ORAL_TABLET | ORAL | 0 refills | Status: DC

## 2016-09-07 MED ORDER — NITROGLYCERIN 0.4 MG SL SUBL: 0.4000 mg | SUBLINGUAL_TABLET | SUBLINGUAL | 6 refills | Status: DC | PRN

## 2016-09-07 NOTE — Progress Notes (Signed)
Subjective:    Patient ID: Andrew [name], male    DOB: 24-Jun-1941, 75 y.o.   MRN: [id number]  Medication Refill    Patient presents with 5 days of pain radiating from his neck on the right side into his right shoulder down into his right tricep. The pain is deep and intense and aching in quality. He denies any weakness or numbness in his hands. He also has pain radiating into his right shoulder blade from his neck. He denies any falls or injury. He does have pain with range of motion in the neck  Past Medical History:  Diagnosis Date  . Allergy    grass etc.  . Barrett's esophagus   . BPH (benign prostatic hyperplasia)   . Chest pain, atypical   . Chronic kidney disease    kidney stones  . Coronary artery disease   . GERD (gastroesophageal reflux disease)    Barrett's  . Hernia   . Hyperlipidemia   . Hypertension   . Myocardial infarction   . Ulcer ([place]) 1960   Past Surgical History:  Procedure Laterality Date  . CARDIAC CATHETERIZATION  02/14/2007   MITRAL REGURGITATION. LV NORMAL  . CHOLECYSTECTOMY    . COLONOSCOPY    . CORONARY ARTERY BYPASS GRAFT     twice  2 by passes each time  . INGUINAL HERNIA REPAIR     bilateral done twice  . LITHOTRIPSY    . UPPER GASTROINTESTINAL ENDOSCOPY     Current Outpatient Prescriptions on File Prior to Visit  Medication Sig Dispense Refill  . aspirin 81 MG tablet Take 81 mg by mouth daily.      . Cholecalciferol (VITAMIN D) 2000 units CAPS Take 1 capsule by mouth daily.    . Dutasteride-Tamsulosin HCl (JALYN) 0.5-0.4 MG CAPS Take by mouth daily.      . lansoprazole (PREVACID) 30 MG capsule Take 30 mg by mouth daily.     . metoprolol succinate (TOPROL-XL) 25 MG 24 hr tablet take 1 tablet by mouth once daily 90 tablet 3  . Multiple Vitamin (MULTIVITAMIN) tablet Take 1 tablet by mouth daily.      . Omega-3 Fatty Acids (FISH OIL) 1000 MG CPDR Take 1,000 mg by mouth daily.      . simvastatin (ZOCOR) 10 MG tablet Take 1 tablet (10 mg  total) by mouth at bedtime. 90 tablet 3   No current facility-administered medications on file prior to visit.    Allergies  Allergen Reactions  . Imdur [Isosorbide Mononitrate]     Unknown, per pt   . Meloxicam     Facial swelling    Social History   Social History  . Marital status: Married    Spouse name: N/A  . Number of children: N/A  . Years of education: N/A   Occupational History  . Not on file.   Social History Main Topics  . Smoking status: Never Smoker  . Smokeless tobacco: Never Used  . Alcohol use No  . Drug use: No  . Sexual activity: Not on file   Other Topics Concern  . Not on file   Social History Narrative  . No narrative on file      Review of Systems  All other systems reviewed and are negative.      Objective:   Physical Exam  Constitutional: He appears well-developed and well-nourished.  Neck: Neck supple. No thyromegaly present.  Cardiovascular: Normal rate, regular rhythm and normal heart sounds.   No  murmur heard. Pulmonary/Chest: Effort normal and breath sounds normal. No respiratory distress. He has no wheezes. He has no rales.  Abdominal: Soft. Bowel sounds are normal. He exhibits no distension. There is no tenderness. There is no rebound and no guarding.  Musculoskeletal: He exhibits no edema.  Lymphadenopathy:    He has no cervical adenopathy.  Vitals reviewed.   Patient has a negative Hawkins sign. He has a negative empty can sign. He has normal range of motion in the right shoulder without pain. He does have pain with range of motion in the neck and foward flexion. He has negative Spurling sign. He has normal reflexes at the biceps, brachioradialis, and triceps bilaterally    Assessment & Plan:  Cervical disc disorder with radiculopathy of cervical region - Plan: predniSONE (DELTASONE) 20 MG tablet  Symptoms are consistent with a herniated disc causing right-sided nerve impingement. Begin prednisone taper pack and recheck  in one week if no better or sooner if worse

## 2016-09-10 DIAGNOSIS — D485 Neoplasm of uncertain behavior of skin: Secondary | ICD-10-CM | POA: Diagnosis not present

## 2016-09-10 DIAGNOSIS — D225 Melanocytic nevi of trunk: Secondary | ICD-10-CM | POA: Diagnosis not present

## 2016-09-10 DIAGNOSIS — C44311 Basal cell carcinoma of skin of nose: Secondary | ICD-10-CM | POA: Diagnosis not present

## 2016-09-11 ENCOUNTER — Ambulatory Visit (INDEPENDENT_AMBULATORY_CARE_PROVIDER_SITE_OTHER): Payer: Medicare HMO | Admitting: Physician Assistant

## 2016-09-11 ENCOUNTER — Encounter: Payer: Self-pay | Admitting: Physician Assistant

## 2016-09-11 VITALS — BP 130/62 | HR 70 | Temp 97.9°F | Resp 16 | Wt 164.0 lb

## 2016-09-11 DIAGNOSIS — M545 Low back pain, unspecified: Secondary | ICD-10-CM

## 2016-09-11 MED ORDER — TRAMADOL HCL 50 MG PO TABS: 50.0000 mg | ORAL_TABLET | Freq: Three times a day (TID) | ORAL | 0 refills | Status: DC | PRN

## 2016-09-11 MED ORDER — CYCLOBENZAPRINE HCL 10 MG PO TABS: 10.0000 mg | ORAL_TABLET | Freq: Three times a day (TID) | ORAL | 0 refills | Status: DC | PRN

## 2016-09-11 NOTE — Progress Notes (Signed)
Patient ID: Andrew [name] MRN: [id number], DOB: Sep 04, 1941, 75 y.o. Date of Encounter: 09/11/2016, 11:24 AM    Chief Complaint:  Chief Complaint  Patient presents with  . pain  lower back     x 1 day constant pain      HPI: 75 y.o. year old male presents with above.   Points to the middle of his low back at about L3 level as area of pain. Says that the pain has been in that area and spreading out to both sides. Says that he started noticing it yesterday at around noon. Yesterday around 4 PM it was worse. Last night it was throbbing for 45 minutes before he finally got some sleep.  Today is Friday. He states that on Wednesday he did the following activities: Used the riding lawnmower. Used the push mower. Raked leaves. Moved a water heater. Cut some wood---carried it in to the stove.  Several days prior he had used a 20 foot ladder to clean out gutters.  He also says that now he thinks about it-- last week he went down in the crawl space and the light bulb was out so he had to lay down and twist upward to reach up to that bulb to replace it. Wondering if this is what caused his neck and shoulder pain that he had to see Dr. Dennard Schaumann for recently. He had office visit with him 09/07/16. Was prescribed prednisone at that visit. Patient states that he has been taking the prednisone as directed and is on the last couple days of that.    Home Meds:   Outpatient Medications Prior to Visit  Medication Sig Dispense Refill  . aspirin 81 MG tablet Take 81 mg by mouth daily.      . Cholecalciferol (VITAMIN D) 2000 units CAPS Take 1 capsule by mouth daily.    . Dutasteride-Tamsulosin HCl (JALYN) 0.5-0.4 MG CAPS Take by mouth daily.      . lansoprazole (PREVACID) 30 MG capsule Take 30 mg by mouth daily.     . metoprolol succinate (TOPROL-XL) 25 MG 24 hr tablet take 1 tablet by mouth once daily 90 tablet 3  . Multiple Vitamin (MULTIVITAMIN) tablet Take 1 tablet by mouth daily.      .  nitroGLYCERIN (NITROSTAT) 0.4 MG SL tablet Place 1 tablet (0.4 mg total) under the tongue every 5 (five) minutes as needed. 25 tablet 6  . Omega-3 Fatty Acids (FISH OIL) 1000 MG CPDR Take 1,000 mg by mouth daily.      . predniSONE (DELTASONE) 20 MG tablet 3 tabs poqday 1-2, 2 tabs poqday 3-4, 1 tab poqday 5-6 12 tablet 0  . simvastatin (ZOCOR) 10 MG tablet Take 1 tablet (10 mg total) by mouth at bedtime. 90 tablet 3   No facility-administered medications prior to visit.     Allergies:  Allergies  Allergen Reactions  . Imdur [Isosorbide Mononitrate]     Unknown, per pt   . Meloxicam     Facial swelling       Review of Systems: See HPI for pertinent ROS. All other ROS negative.    Physical Exam: Blood pressure 130/62, pulse 70, temperature 97.9 F (36.6 C), temperature source Oral, resp. rate 16, weight 164 lb (74.4 kg), SpO2 99 %., Body mass index is 24.22 kg/m. General: WNWD WM.  Appears in no acute distress. Neck: Supple. No thyromegaly. No lymphadenopathy. Lungs: Clear bilaterally to auscultation without wheezes, rales, or rhonchi. Breathing is unlabored. Heart: Regular  rhythm. No murmurs, rubs, or gallops. Msk:  Strength and tone normal for age. Points to back at about L3 level as area of pain. Midline and spreading out bilaterally.  No significant tenderness reproduced with palpation. Extremities/Skin: Warm and dry. Neuro: Alert and oriented X 3. Moves all extremities spontaneously. Gait is normal. CNII-XII grossly in tact. Psych:  Responds to questions appropriately with a normal affect.     ASSESSMENT AND PLAN:  75 y.o. year old male with  1. 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 I discussed that I can understand this and it may be hard for him to sit still but that if he does not rest these muscles that this pain  is going to take a long time to resolve. Cautioned him that the Flexeril and Tramadol may cause drowsiness and not to take prior to driving and operating machinery and climbing ladders etc. He is you to use these medications. Also if he has a heating pad can use this or warm water in the shower should help and he can gently stretch his low back. As well  continuing to complete the prednisone taper prescribed 09/07/16. F/U if symptoms worsen or are not improving over the next several days. - cyclobenzaprine (FLEXERIL) 10 MG tablet; Take 1 tablet (10 mg total) by mouth 3 (three) times daily as needed for muscle spasms.  Dispense: 30 tablet; Refill: 0 - traMADol (ULTRAM) 50 MG tablet; Take 1 tablet (50 mg total) by mouth every 8 (eight) hours as needed.  Dispense: 60 tablet; Refill: 0   Signed, [address] [place], [place], [facility] 09/11/2016 11:24 AM

## 2016-09-14 ENCOUNTER — Telehealth: Payer: Self-pay | Admitting: Family Medicine

## 2016-09-14 NOTE — Telephone Encounter (Signed)
[id number]  Patient is calling to say that the flexeril that he is taking is not agreeing with him he is also taking tramadol  Would like a call back regarding this [phone] (H)

## 2016-09-14 NOTE — Telephone Encounter (Signed)
Pt states he couldn't sleep through the night trouble urinating,Flexeril and Tramadol was the latest to be prescribe so pt is not sure which Rx it is.  Pt states he did not start having problem until starting theses two medications   Pls advise

## 2016-09-14 NOTE — Telephone Encounter (Signed)
I called patient back. He says that last night he took both the Flexeril and the tramadol at the same time. Says that during the night he was waking up about every 45 minutes needing to urinate. When he would urinate says that it was slow to come out. Says that he knows he has prostate problems and takes medicine for that but usually that is controlled except for these problems last night. Wasn't sure which of these 2 medicines ( the Flexeril of the tramadol) may have been causing this issue last night. He says that his low back is much better. We decided that tonight he will hold off on the Flexeril and will only take the tramadol tonight and see if he has any problems/adverse effects with doing just this one pill by itself.

## 2016-09-28 ENCOUNTER — Other Ambulatory Visit: Payer: Self-pay | Admitting: Family Medicine

## 2016-11-05 ENCOUNTER — Other Ambulatory Visit: Payer: Medicare HMO

## 2016-11-05 ENCOUNTER — Other Ambulatory Visit: Payer: Self-pay | Admitting: Family Medicine

## 2016-11-05 DIAGNOSIS — I10 Essential (primary) hypertension: Secondary | ICD-10-CM | POA: Diagnosis not present

## 2016-11-05 DIAGNOSIS — Z79899 Other long term (current) drug therapy: Secondary | ICD-10-CM

## 2016-11-05 DIAGNOSIS — E785 Hyperlipidemia, unspecified: Secondary | ICD-10-CM | POA: Diagnosis not present

## 2016-11-05 DIAGNOSIS — R7309 Other abnormal glucose: Secondary | ICD-10-CM | POA: Diagnosis not present

## 2016-11-05 LAB — CBC WITH DIFFERENTIAL/PLATELET
BASOS ABS: 0 {cells}/uL (ref 0–200)
BASOS PCT: 0 %
EOS ABS: 134 {cells}/uL (ref 15–500)
Eosinophils Relative: 2 %
HEMATOCRIT: 41.8 % (ref 38.5–50.0)
Hemoglobin: 14.2 g/dL (ref 13.0–17.0)
LYMPHS PCT: 26 %
Lymphs Abs: 1742 cells/uL (ref 850–3900)
MCH: 30.2 pg (ref 27.0–33.0)
MCHC: 34 g/dL (ref 32.0–36.0)
MCV: 88.9 fL (ref 80.0–100.0)
MONO ABS: 603 {cells}/uL (ref 200–950)
MONOS PCT: 9 %
MPV: 10.2 fL (ref 7.5–12.5)
NEUTROS ABS: 4221 {cells}/uL (ref 1500–7800)
Neutrophils Relative %: 63 %
PLATELETS: 131 10*3/uL — AB (ref 140–400)
RBC: 4.7 MIL/uL (ref 4.20–5.80)
RDW: 13.6 % (ref 11.0–15.0)
WBC: 6.7 10*3/uL (ref 3.8–10.8)

## 2016-11-05 LAB — COMPREHENSIVE METABOLIC PANEL
ALK PHOS: 51 U/L (ref 40–115)
ALT: 11 U/L (ref 9–46)
AST: 14 U/L (ref 10–35)
Albumin: 4 g/dL (ref 3.6–5.1)
BUN: 16 mg/dL (ref 7–25)
CALCIUM: 8.9 mg/dL (ref 8.6–10.3)
CHLORIDE: 105 mmol/L (ref 98–110)
CO2: 26 mmol/L (ref 20–31)
Creat: 0.78 mg/dL (ref 0.70–1.18)
Glucose, Bld: 122 mg/dL — ABNORMAL HIGH (ref 70–99)
POTASSIUM: 4.1 mmol/L (ref 3.5–5.3)
Sodium: 142 mmol/L (ref 135–146)
TOTAL PROTEIN: 5.9 g/dL — AB (ref 6.1–8.1)
Total Bilirubin: 0.7 mg/dL (ref 0.2–1.2)

## 2016-11-05 LAB — LIPID PANEL
CHOL/HDL RATIO: 2.5 ratio (ref <5.0)
CHOLESTEROL: 99 mg/dL (ref <200)
HDL: 40 mg/dL — AB (ref >40)
LDL Cholesterol: 44 mg/dL (ref <100)
Triglycerides: 73 mg/dL (ref <150)
VLDL: 15 mg/dL (ref <30)

## 2016-11-07 LAB — HEMOGLOBIN A1C
Hgb A1c MFr Bld: 5.9 % — ABNORMAL HIGH (ref <5.7)
MEAN PLASMA GLUCOSE: 123 mg/dL

## 2016-11-12 DIAGNOSIS — Z85828 Personal history of other malignant neoplasm of skin: Secondary | ICD-10-CM | POA: Diagnosis not present

## 2016-11-12 DIAGNOSIS — L820 Inflamed seborrheic keratosis: Secondary | ICD-10-CM | POA: Diagnosis not present

## 2016-11-12 DIAGNOSIS — Z08 Encounter for follow-up examination after completed treatment for malignant neoplasm: Secondary | ICD-10-CM | POA: Diagnosis not present

## 2016-11-13 ENCOUNTER — Encounter: Payer: Self-pay | Admitting: Family Medicine

## 2016-11-13 ENCOUNTER — Ambulatory Visit (INDEPENDENT_AMBULATORY_CARE_PROVIDER_SITE_OTHER): Payer: Medicare HMO | Admitting: Family Medicine

## 2016-11-13 VITALS — BP 150/76 | HR 78 | Temp 97.9°F | Resp 16 | Ht 69.0 in | Wt 162.0 lb

## 2016-11-13 DIAGNOSIS — R7303 Prediabetes: Secondary | ICD-10-CM

## 2016-11-13 DIAGNOSIS — E7800 Pure hypercholesterolemia, unspecified: Secondary | ICD-10-CM

## 2016-11-13 DIAGNOSIS — I2510 Atherosclerotic heart disease of native coronary artery without angina pectoris: Secondary | ICD-10-CM | POA: Diagnosis not present

## 2016-11-13 DIAGNOSIS — I10 Essential (primary) hypertension: Secondary | ICD-10-CM

## 2016-11-13 NOTE — Progress Notes (Signed)
Subjective:    Patient ID: Andrew [name], male    DOB: 1941/05/25, 76 y.o.   MRN: [id number]  HPI Patient is here for follow up of his prediabetes and hypertension.  Most recent lab work is listed below: Orders Only on 11/05/2016  Component Date Value Ref Range Status  . Hgb A1c MFr Bld 11/05/2016 5.9* <5.7 % Final   Comment:   For someone without known diabetes, a hemoglobin A1c value between 5.7% and 6.4% is consistent with prediabetes and should be confirmed with a follow-up test.   For someone with known diabetes, a value <7% indicates that their diabetes is well controlled. A1c targets should be individualized based on duration of diabetes, age, co-morbid conditions and other considerations.   This assay result is consistent with an increased risk of diabetes.   Currently, no consensus exists regarding use of hemoglobin A1c for diagnosis of diabetes in children.     . Mean Plasma Glucose 11/05/2016 123  mg/dL Final  Appointment on 11/05/2016  Component Date Value Ref Range Status  . WBC 11/05/2016 6.7  3.8 - 10.8 K/uL Final  . RBC 11/05/2016 4.70  4.20 - 5.80 MIL/uL Final  . Hemoglobin 11/05/2016 14.2  13.0 - 17.0 g/dL Final  . HCT 11/05/2016 41.8  38.5 - 50.0 % Final  . MCV 11/05/2016 88.9  80.0 - 100.0 fL Final  . MCH 11/05/2016 30.2  27.0 - 33.0 pg Final  . MCHC 11/05/2016 34.0  32.0 - 36.0 g/dL Final  . RDW 11/05/2016 13.6  11.0 - 15.0 % Final  . Platelets 11/05/2016 131* 140 - 400 K/uL Final  . MPV 11/05/2016 10.2  7.5 - 12.5 fL Final  . Neutro Abs 11/05/2016 4221  1,500 - 7,800 cells/uL Final  . Lymphs Abs 11/05/2016 1742  850 - 3,900 cells/uL Final  . Monocytes Absolute 11/05/2016 603  200 - 950 cells/uL Final  . Eosinophils Absolute 11/05/2016 134  15 - 500 cells/uL Final  . Basophils Absolute 11/05/2016 0  0 - 200 cells/uL Final  . Neutrophils Relative % 11/05/2016 63  % Final  . Lymphocytes Relative 11/05/2016 26  % Final  . Monocytes Relative 11/05/2016  9  % Final  . Eosinophils Relative 11/05/2016 2  % Final  . Basophils Relative 11/05/2016 0  % Final  . Smear Review 11/05/2016 Criteria for review not met   Final  . Sodium 11/05/2016 142  135 - 146 mmol/L Final  . Potassium 11/05/2016 4.1  3.5 - 5.3 mmol/L Final  . Chloride 11/05/2016 105  98 - 110 mmol/L Final  . CO2 11/05/2016 26  20 - 31 mmol/L Final  . Glucose, Bld 11/05/2016 122* 70 - 99 mg/dL Final  . BUN 11/05/2016 16  7 - 25 mg/dL Final  . Creat 11/05/2016 0.78  0.70 - 1.18 mg/dL Final   Comment:   For patients > or = 78 years of age: The upper reference limit for Creatinine is approximately 13% higher for people identified as African-American.     . Total Bilirubin 11/05/2016 0.7  0.2 - 1.2 mg/dL Final  . Alkaline Phosphatase 11/05/2016 51  40 - 115 U/L Final  . AST 11/05/2016 14  10 - 35 U/L Final  . ALT 11/05/2016 11  9 - 46 U/L Final  . Total Protein 11/05/2016 5.9* 6.1 - 8.1 g/dL Final  . Albumin 11/05/2016 4.0  3.6 - 5.1 g/dL Final  . Calcium 11/05/2016 8.9  8.6 - 10.3 mg/dL Final  .  Cholesterol 11/05/2016 99  <200 mg/dL Final  . Triglycerides 11/05/2016 73  <150 mg/dL Final  . HDL 92/69/9787 40* >40 mg/dL Final  . Total CHOL/HDL Ratio 11/05/2016 2.5  <4.6 Ratio Final  . VLDL 11/05/2016 15  <30 mg/dL Final  . LDL Cholesterol 11/05/2016 44  <100 mg/dL Final   He has lost 4 lbs over the last year and has noticed decrease in muscle mass.  Serum protein is slightly low and creatinine is dropping.  Otherwise he is doing very well with no complaints.  I rechecked his BP and found it to be 140/72.   Past Medical History:  Diagnosis Date  . Allergy    grass etc.  . Barrett's esophagus   . BPH (benign prostatic hyperplasia)   . Chest pain, atypical   . Chronic kidney disease    kidney stones  . Coronary artery disease   . GERD (gastroesophageal reflux disease)    Barrett's  . Hernia   . Hyperlipidemia   . Hypertension   . Myocardial infarction   . Ulcer ([facility])  1960   Past Surgical History:  Procedure Laterality Date  . CARDIAC CATHETERIZATION  02/14/2007   MITRAL REGURGITATION. LV NORMAL  . CHOLECYSTECTOMY    . COLONOSCOPY    . CORONARY ARTERY BYPASS GRAFT     twice  2 by passes each time  . INGUINAL HERNIA REPAIR     bilateral done twice  . LITHOTRIPSY    . UPPER GASTROINTESTINAL ENDOSCOPY     Current Outpatient Prescriptions on File Prior to Visit  Medication Sig Dispense Refill  . aspirin 81 MG tablet Take 81 mg by mouth daily.      . Cholecalciferol (VITAMIN D) 2000 units CAPS Take 1 capsule by mouth daily.    . Dutasteride-Tamsulosin HCl (JALYN) 0.5-0.4 MG CAPS Take by mouth daily.      . lansoprazole (PREVACID) 30 MG capsule Take 30 mg by mouth daily.     . metoprolol succinate (TOPROL-XL) 25 MG 24 hr tablet take 1 tablet by mouth once daily 90 tablet 3  . Multiple Vitamin (MULTIVITAMIN) tablet Take 1 tablet by mouth daily.      . nitroGLYCERIN (NITROSTAT) 0.4 MG SL tablet Place 1 tablet (0.4 mg total) under the tongue every 5 (five) minutes as needed. 25 tablet 6  . Omega-3 Fatty Acids (FISH OIL) 1000 MG CPDR Take 1,000 mg by mouth daily.      . simvastatin (ZOCOR) 10 MG tablet TAKE 1 TABLET AT BEDTIME 90 tablet 3   No current facility-administered medications on file prior to visit.    Allergies  Allergen Reactions  . Imdur [Isosorbide Mononitrate]     Unknown, per pt   . Meloxicam     Facial swelling    Social History   Social History  . Marital status: Married    Spouse name: N/A  . Number of children: N/A  . Years of education: N/A   Occupational History  . Not on file.   Social History Main Topics  . Smoking status: Never Smoker  . Smokeless tobacco: Never Used  . Alcohol use No  . Drug use: No  . Sexual activity: Not on file   Other Topics Concern  . Not on file   Social History Narrative  . No narrative on file   Family History  Problem Relation Age of Onset  . Heart disease Father        Review of Systems  All other systems  reviewed and are negative.      Objective:   Physical Exam  Constitutional: He appears well-developed and well-nourished.  Neck: Neck supple. No JVD present. No thyromegaly present.  Cardiovascular: Normal rate, regular rhythm and normal heart sounds.   No murmur heard. Pulmonary/Chest: Effort normal and breath sounds normal. No respiratory distress. He has no wheezes. He has no rales.  Abdominal: Soft. Bowel sounds are normal. He exhibits no distension. There is no tenderness. There is no rebound.  Lymphadenopathy:    He has no cervical adenopathy.  Vitals reviewed.         Assessment & Plan:  Hypertension, prediabetes, hyperlipidemia  Blood sugar is much better. A1c is down to 5.9. Cholesterol is excellent. Blood pressures a little on the high side today. Opacification start checking this at home. I rechecked it and found it to be better although still borderline. He will check his blood pressure everyday and notify me the values. I believe the drop in his weight is due to decrease in muscle mass as evidenced by his lowering creatinine and also his low serum protein. I recommended that he start taking a protein supplement such as Glucerna or trying to eat lean healthy proteins such as fish, [place], deer, beans, nuts. Recheck in 4 months or sooner if worse

## 2017-02-10 ENCOUNTER — Other Ambulatory Visit: Payer: Self-pay | Admitting: Family Medicine

## 2017-02-10 ENCOUNTER — Other Ambulatory Visit: Payer: Medicare HMO

## 2017-02-10 DIAGNOSIS — R7309 Other abnormal glucose: Secondary | ICD-10-CM | POA: Diagnosis not present

## 2017-02-10 DIAGNOSIS — E785 Hyperlipidemia, unspecified: Secondary | ICD-10-CM

## 2017-02-10 DIAGNOSIS — Z79899 Other long term (current) drug therapy: Secondary | ICD-10-CM

## 2017-02-10 DIAGNOSIS — I10 Essential (primary) hypertension: Secondary | ICD-10-CM | POA: Diagnosis not present

## 2017-02-10 LAB — LIPID PANEL
CHOL/HDL RATIO: 3.1 ratio (ref <5.0)
Cholesterol: 111 mg/dL (ref <200)
HDL: 36 mg/dL — ABNORMAL LOW (ref >40)
LDL CALC: 59 mg/dL (ref <100)
Triglycerides: 81 mg/dL (ref <150)
VLDL: 16 mg/dL (ref <30)

## 2017-02-10 LAB — COMPREHENSIVE METABOLIC PANEL
ALK PHOS: 52 U/L (ref 40–115)
ALT: 12 U/L (ref 9–46)
AST: 14 U/L (ref 10–35)
Albumin: 3.9 g/dL (ref 3.6–5.1)
BUN: 22 mg/dL (ref 7–25)
CHLORIDE: 107 mmol/L (ref 98–110)
CO2: 26 mmol/L (ref 20–31)
CREATININE: 0.93 mg/dL (ref 0.70–1.18)
Calcium: 8.8 mg/dL (ref 8.6–10.3)
Glucose, Bld: 134 mg/dL — ABNORMAL HIGH (ref 70–99)
Potassium: 4.1 mmol/L (ref 3.5–5.3)
SODIUM: 143 mmol/L (ref 135–146)
TOTAL PROTEIN: 6.1 g/dL (ref 6.1–8.1)
Total Bilirubin: 0.5 mg/dL (ref 0.2–1.2)

## 2017-02-10 LAB — CBC WITH DIFFERENTIAL/PLATELET
BASOS PCT: 0 %
Basophils Absolute: 0 cells/uL (ref 0–200)
EOS PCT: 4 %
Eosinophils Absolute: 272 cells/uL (ref 15–500)
HCT: 42.9 % (ref 38.5–50.0)
Hemoglobin: 14.3 g/dL (ref 13.0–17.0)
Lymphocytes Relative: 21 %
Lymphs Abs: 1428 cells/uL (ref 850–3900)
MCH: 30 pg (ref 27.0–33.0)
MCHC: 33.3 g/dL (ref 32.0–36.0)
MCV: 89.9 fL (ref 80.0–100.0)
MONOS PCT: 7 %
MPV: 9.8 fL (ref 7.5–12.5)
Monocytes Absolute: 476 cells/uL (ref 200–950)
NEUTROS ABS: 4624 {cells}/uL (ref 1500–7800)
Neutrophils Relative %: 68 %
PLATELETS: 122 10*3/uL — AB (ref 140–400)
RBC: 4.77 MIL/uL (ref 4.20–5.80)
RDW: 13.2 % (ref 11.0–15.0)
WBC: 6.8 10*3/uL (ref 3.8–10.8)

## 2017-02-12 LAB — HEMOGLOBIN A1C
HEMOGLOBIN A1C: 5.8 % — AB (ref <5.7)
Mean Plasma Glucose: 120 mg/dL

## 2017-04-13 ENCOUNTER — Other Ambulatory Visit: Payer: Self-pay

## 2017-04-13 DIAGNOSIS — J040 Acute laryngitis: Secondary | ICD-10-CM

## 2017-04-13 MED ORDER — CETIRIZINE HCL 10 MG PO TABS: 10.0000 mg | ORAL_TABLET | Freq: Every day | ORAL | 3 refills | Status: DC

## 2017-04-15 ENCOUNTER — Other Ambulatory Visit: Payer: Self-pay | Admitting: Family Medicine

## 2017-05-17 ENCOUNTER — Telehealth: Payer: Self-pay | Admitting: Family Medicine

## 2017-05-18 ENCOUNTER — Encounter: Payer: Self-pay | Admitting: Family Medicine

## 2017-05-18 ENCOUNTER — Other Ambulatory Visit: Payer: Self-pay | Admitting: Family Medicine

## 2017-05-18 ENCOUNTER — Ambulatory Visit (INDEPENDENT_AMBULATORY_CARE_PROVIDER_SITE_OTHER): Payer: Medicare HMO | Admitting: Family Medicine

## 2017-05-18 VITALS — BP 130/78 | HR 64 | Temp 98.3°F | Resp 14 | Ht 69.0 in | Wt 159.0 lb

## 2017-05-18 DIAGNOSIS — A6920 Lyme disease, unspecified: Secondary | ICD-10-CM | POA: Diagnosis not present

## 2017-05-18 MED ORDER — DOXYCYCLINE HYCLATE 100 MG PO TABS: 100.0000 mg | ORAL_TABLET | Freq: Two times a day (BID) | ORAL | 0 refills | Status: DC

## 2017-05-18 NOTE — Progress Notes (Signed)
Subjective:    Patient ID: Andrew [name], male    DOB: 03-15-1941, 76 y.o.   MRN: [id number]  HPI  Patient has been bitten by numerous ticks the summer. One on his upper medial right thigh developed a red ring the size of his hand. There is no visible rash there now. However he states that there is clearing in the center of the red ring and in the center of the clearing was the tick bite. The description sounds consistent with erythema migrans. Since that time he reports a dull headache and some generalized aches and pains. He denies any neurologic deficit, meningitis type symptoms, or cardiac abnormalities such as palpitations shortness of breath or syncope. Past Medical History:  Diagnosis Date  . Allergy    grass etc.  . Barrett's esophagus   . BPH (benign prostatic hyperplasia)   . Chest pain, atypical   . Chronic kidney disease    kidney stones  . Coronary artery disease   . GERD (gastroesophageal reflux disease)    Barrett's  . Hernia   . Hyperlipidemia   . Hypertension   . Myocardial infarction ([place])   . Ulcer 1960   Past Surgical History:  Procedure Laterality Date  . CARDIAC CATHETERIZATION  02/14/2007   MITRAL REGURGITATION. LV NORMAL  . CHOLECYSTECTOMY    . COLONOSCOPY    . CORONARY ARTERY BYPASS GRAFT     twice  2 by passes each time  . INGUINAL HERNIA REPAIR     bilateral done twice  . LITHOTRIPSY    . UPPER GASTROINTESTINAL ENDOSCOPY     Current Outpatient Prescriptions on File Prior to Visit  Medication Sig Dispense Refill  . aspirin 81 MG tablet Take 81 mg by mouth daily.      . Cholecalciferol (VITAMIN D) 2000 units CAPS Take 1 capsule by mouth daily.    . Dutasteride-Tamsulosin HCl (JALYN) 0.5-0.4 MG CAPS Take by mouth daily.      . lansoprazole (PREVACID) 30 MG capsule Take 30 mg by mouth daily.     . metoprolol succinate (TOPROL-XL) 25 MG 24 hr tablet take 1 tablet by mouth once daily 90 tablet 3  . Multiple Vitamin (MULTIVITAMIN) tablet Take 1 tablet  by mouth daily.      . nitroGLYCERIN (NITROSTAT) 0.4 MG SL tablet Place 1 tablet (0.4 mg total) under the tongue every 5 (five) minutes as needed. 25 tablet 6  . Omega-3 Fatty Acids (FISH OIL) 1000 MG CPDR Take 1,000 mg by mouth daily.      . simvastatin (ZOCOR) 10 MG tablet TAKE 1 TABLET AT BEDTIME 90 tablet 3  . cetirizine (ZYRTEC) 10 MG tablet Take 1 tablet (10 mg total) by mouth daily. (Patient not taking: Reported on 05/18/2017) 90 tablet 3   No current facility-administered medications on file prior to visit.    Allergies  Allergen Reactions  . Imdur [Isosorbide Mononitrate]     Unknown, per pt   . Meloxicam     Facial swelling    Social History   Social History  . Marital status: Married    Spouse name: N/A  . Number of children: N/A  . Years of education: N/A   Occupational History  . Not on file.   Social History Main Topics  . Smoking status: Never Smoker  . Smokeless tobacco: Never Used  . Alcohol use No  . Drug use: No  . Sexual activity: Not on file   Other Topics Concern  . Not  on file   Social History Narrative  . No narrative on file     Review of Systems  All other systems reviewed and are negative.      Objective:   Physical Exam  Neck: Neck supple.  Cardiovascular: Normal rate, regular rhythm and normal heart sounds.   No murmur heard. Pulmonary/Chest: Effort normal and breath sounds normal. No respiratory distress. He has no wheezes. He has no rales.  Abdominal: Soft. Bowel sounds are normal. He exhibits no distension. There is no tenderness. There is no rebound.  Lymphadenopathy:    He has no cervical adenopathy.  Skin: No rash noted. No erythema.  Vitals reviewed.         Assessment & Plan:  Erythema migrans (Lyme disease) - Plan: doxycycline (VIBRA-TABS) 100 MG tablet, B. burgdorfi antibodies by WB  Patient's history and description of the rash sound consistent with erythema migrans. I will treat the patient empirically for Lyme  disease with doxycycline 100 mg by mouth twice a day for 21 days. In addition I will check Lyme titers. However I've asked the patient to initiate therapy prior to the blood work returning given the description of the rash and his possible neurologic symptoms (headache).

## 2017-05-19 LAB — LYME ABY, WSTRN BLT IGG & IGM W/BANDS
B BURGDORFERI IGM ABS (IB): NEGATIVE
B burgdorferi IgG Abs (IB): NEGATIVE
LYME DISEASE 23 KD IGG: NONREACTIVE
LYME DISEASE 28 KD IGG: NONREACTIVE
LYME DISEASE 39 KD IGG: NONREACTIVE
LYME DISEASE 41 KD IGG: NONREACTIVE
LYME DISEASE 45 KD IGG: NONREACTIVE
LYME DISEASE 93 KD IGG: NONREACTIVE
Lyme Disease 18 kD IgG: NONREACTIVE
Lyme Disease 23 kD IgM: NONREACTIVE
Lyme Disease 30 kD IgG: NONREACTIVE
Lyme Disease 39 kD IgM: NONREACTIVE
Lyme Disease 41 kD IgM: NONREACTIVE
Lyme Disease 58 kD IgG: NONREACTIVE
Lyme Disease 66 kD IgG: NONREACTIVE

## 2017-06-08 ENCOUNTER — Other Ambulatory Visit: Payer: Self-pay | Admitting: Family Medicine

## 2017-06-09 DIAGNOSIS — R351 Nocturia: Secondary | ICD-10-CM | POA: Diagnosis not present

## 2017-06-09 DIAGNOSIS — N403 Nodular prostate with lower urinary tract symptoms: Secondary | ICD-10-CM | POA: Diagnosis not present

## 2017-06-09 DIAGNOSIS — E291 Testicular hypofunction: Secondary | ICD-10-CM | POA: Diagnosis not present

## 2017-07-09 ENCOUNTER — Encounter: Payer: Self-pay | Admitting: Family Medicine

## 2017-07-09 ENCOUNTER — Ambulatory Visit (INDEPENDENT_AMBULATORY_CARE_PROVIDER_SITE_OTHER): Payer: Medicare HMO | Admitting: Family Medicine

## 2017-07-09 VITALS — BP 138/74 | HR 76 | Temp 98.1°F | Resp 14 | Ht 69.0 in | Wt 163.0 lb

## 2017-07-09 DIAGNOSIS — J329 Chronic sinusitis, unspecified: Secondary | ICD-10-CM | POA: Diagnosis not present

## 2017-07-09 DIAGNOSIS — J310 Chronic rhinitis: Secondary | ICD-10-CM

## 2017-07-09 MED ORDER — PREDNISONE 20 MG PO TABS: ORAL_TABLET | ORAL | 0 refills | Status: DC

## 2017-07-09 MED ORDER — AMOXICILLIN 875 MG PO TABS: 875.0000 mg | ORAL_TABLET | Freq: Two times a day (BID) | ORAL | 0 refills | Status: DC

## 2017-07-09 NOTE — Progress Notes (Signed)
Subjective:    Patient ID: Andrew [name], male    DOB: 1941/04/19, 76 y.o.   MRN: [id number]  Medication Refill  Associated symptoms include headaches.  Headache     Patient has severe allergies. He reports chronic rhinorrhea, constant postnasal drip, sore scratchy throat for the majority of the spring and summer. Over the last several weeks however he is developed pain and pressure in his frontal sinus. The pain is constant and runs from his temple to his temple bilaterally in a bandlike fashion. It is made worse by leaning forward. It is made worse by sneezing or coughing. He reports pain in his teeth.  Past Medical History:  Diagnosis Date  . Allergy    grass etc.  . Barrett's esophagus   . BPH (benign prostatic hyperplasia)   . Chest pain, atypical   . Chronic kidney disease    kidney stones  . Coronary artery disease   . GERD (gastroesophageal reflux disease)    Barrett's  . Hernia   . Hyperlipidemia   . Hypertension   . Myocardial infarction ([place])   . Ulcer 1960   Past Surgical History:  Procedure Laterality Date  . CARDIAC CATHETERIZATION  02/14/2007   MITRAL REGURGITATION. LV NORMAL  . CHOLECYSTECTOMY    . COLONOSCOPY    . CORONARY ARTERY BYPASS GRAFT     twice  2 by passes each time  . INGUINAL HERNIA REPAIR     bilateral done twice  . LITHOTRIPSY    . UPPER GASTROINTESTINAL ENDOSCOPY     Current Outpatient Prescriptions on File Prior to Visit  Medication Sig Dispense Refill  . aspirin 81 MG tablet Take 81 mg by mouth daily.      . cetirizine (ZYRTEC) 10 MG tablet Take 1 tablet (10 mg total) by mouth daily. 90 tablet 3  . Cholecalciferol (VITAMIN D) 2000 units CAPS Take 1 capsule by mouth daily.    [name] doxycycline (VIBRA-TABS) 100 MG tablet Take 1 tablet (100 mg total) by mouth 2 (two) times daily. 42 tablet 0  . Dutasteride-Tamsulosin HCl (JALYN) 0.5-0.4 MG CAPS Take by mouth daily.      . lansoprazole (PREVACID) 30 MG capsule Take 30 mg by mouth daily.     .  lansoprazole (PREVACID) 30 MG capsule TAKE 1 CAPSULE DAILY 90 capsule 3  . metoprolol succinate (TOPROL-XL) 25 MG 24 hr tablet take 1 tablet by mouth once daily 90 tablet 3  . Multiple Vitamin (MULTIVITAMIN) tablet Take 1 tablet by mouth daily.      . nitroGLYCERIN (NITROSTAT) 0.4 MG SL tablet Place 1 tablet (0.4 mg total) under the tongue every 5 (five) minutes as needed. 25 tablet 6  . Omega-3 Fatty Acids (FISH OIL) 1000 MG CPDR Take 1,000 mg by mouth daily.      . simvastatin (ZOCOR) 10 MG tablet TAKE 1 TABLET AT BEDTIME 90 tablet 3   No current facility-administered medications on file prior to visit.    Allergies  Allergen Reactions  . Imdur [Isosorbide Mononitrate]     Unknown, per pt   . Meloxicam     Facial swelling    Social History   Social History  . Marital status: Married    Spouse name: N/A  . Number of children: N/A  . Years of education: N/A   Occupational History  . Not on file.   Social History Main Topics  . Smoking status: Never Smoker  . Smokeless tobacco: Never Used  . Alcohol use  No  . Drug use: No  . Sexual activity: Not on file   Other Topics Concern  . Not on file   Social History Narrative  . No narrative on file      Review of Systems  Neurological: Positive for headaches.  All other systems reviewed and are negative.      Objective:   Physical Exam  Constitutional: He appears well-developed and well-nourished.  HENT:  Right Ear: Tympanic membrane, external ear and ear canal normal.  Left Ear: Tympanic membrane, external ear and ear canal normal.  Nose: Mucosal edema and rhinorrhea present. Right sinus exhibits frontal sinus tenderness. Left sinus exhibits frontal sinus tenderness.  Mouth/Throat: Oropharynx is clear and moist.  Neck: Neck supple. No thyromegaly present.  Cardiovascular: Normal rate, regular rhythm and normal heart sounds.   No murmur heard. Pulmonary/Chest: Effort normal and breath sounds normal. No respiratory  distress. He has no wheezes. He has no rales.  Abdominal: Soft. Bowel sounds are normal. He exhibits no distension. There is no tenderness. There is no rebound and no guarding.  Musculoskeletal: He exhibits no edema.  Lymphadenopathy:    He has no cervical adenopathy.  Vitals reviewed.       Assessment & Plan:  Rhinosinusitis  I believe the patient has a sinus infection. Begin amoxicillin 875 mg by mouth twice a day for 10 days with a prednisone taper pack and recheck next week if no better or sooner if worse

## 2017-08-16 ENCOUNTER — Ambulatory Visit (INDEPENDENT_AMBULATORY_CARE_PROVIDER_SITE_OTHER): Payer: Medicare HMO | Admitting: Family Medicine

## 2017-08-16 ENCOUNTER — Other Ambulatory Visit: Payer: Self-pay | Admitting: Family Medicine

## 2017-08-16 ENCOUNTER — Other Ambulatory Visit: Payer: Medicare HMO

## 2017-08-16 DIAGNOSIS — I10 Essential (primary) hypertension: Secondary | ICD-10-CM

## 2017-08-16 DIAGNOSIS — E785 Hyperlipidemia, unspecified: Secondary | ICD-10-CM

## 2017-08-16 DIAGNOSIS — Z79899 Other long term (current) drug therapy: Secondary | ICD-10-CM

## 2017-08-16 DIAGNOSIS — R7303 Prediabetes: Secondary | ICD-10-CM

## 2017-08-16 DIAGNOSIS — Z23 Encounter for immunization: Secondary | ICD-10-CM | POA: Diagnosis not present

## 2017-08-17 LAB — COMPLETE METABOLIC PANEL WITH GFR
AG RATIO: 2.1 (calc) (ref 1.0–2.5)
ALBUMIN MSPROF: 4.1 g/dL (ref 3.6–5.1)
ALKALINE PHOSPHATASE (APISO): 59 U/L (ref 40–115)
ALT: 12 U/L (ref 9–46)
AST: 13 U/L (ref 10–35)
BILIRUBIN TOTAL: 0.6 mg/dL (ref 0.2–1.2)
BUN: 19 mg/dL (ref 7–25)
CHLORIDE: 103 mmol/L (ref 98–110)
CO2: 27 mmol/L (ref 20–32)
Calcium: 9 mg/dL (ref 8.6–10.3)
Creat: 0.84 mg/dL (ref 0.70–1.18)
GFR, EST AFRICAN AMERICAN: 97 mL/min/{1.73_m2} (ref >=60)
GFR, Est Non African American: 84 mL/min/{1.73_m2} (ref >=60)
GLOBULIN: 2 g/dL (ref 1.9–3.7)
GLUCOSE: 134 mg/dL — AB (ref 65–99)
Potassium: 4.1 mmol/L (ref 3.5–5.3)
SODIUM: 139 mmol/L (ref 135–146)
TOTAL PROTEIN: 6.1 g/dL (ref 6.1–8.1)

## 2017-08-17 LAB — CBC WITH DIFFERENTIAL/PLATELET
BASOS PCT: 0.6 %
Basophils Absolute: 48 cells/uL (ref 0–200)
EOS ABS: 72 {cells}/uL (ref 15–500)
Eosinophils Relative: 0.9 %
HEMATOCRIT: 41.8 % (ref 38.5–50.0)
Hemoglobin: 14.5 g/dL (ref 13.2–17.1)
Lymphs Abs: 1680 cells/uL (ref 850–3900)
MCH: 30.1 pg (ref 27.0–33.0)
MCHC: 34.7 g/dL (ref 32.0–36.0)
MCV: 86.7 fL (ref 80.0–100.0)
MONOS PCT: 8.4 %
MPV: 10.5 fL (ref 7.5–12.5)
NEUTROS ABS: 5528 {cells}/uL (ref 1500–7800)
Neutrophils Relative %: 69.1 %
PLATELETS: 125 10*3/uL — AB (ref 140–400)
RBC: 4.82 10*6/uL (ref 4.20–5.80)
RDW: 12.1 % (ref 11.0–15.0)
TOTAL LYMPHOCYTE: 21 %
WBC: 8 10*3/uL (ref 3.8–10.8)
WBCMIX: 672 {cells}/uL (ref 200–950)

## 2017-08-17 LAB — HEMOGLOBIN A1C
Hgb A1c MFr Bld: 6.3 % of total Hgb — ABNORMAL HIGH (ref <5.7)
Mean Plasma Glucose: 134 (calc)
eAG (mmol/L): 7.4 (calc)

## 2017-08-17 LAB — LIPID PANEL
CHOLESTEROL: 111 mg/dL (ref <200)
HDL: 39 mg/dL — ABNORMAL LOW (ref >40)
LDL CHOLESTEROL (CALC): 55 mg/dL
Non-HDL Cholesterol (Calc): 72 mg/dL (calc) (ref <130)
Total CHOL/HDL Ratio: 2.8 (calc) (ref <5.0)
Triglycerides: 89 mg/dL (ref <150)

## 2017-08-24 ENCOUNTER — Ambulatory Visit (INDEPENDENT_AMBULATORY_CARE_PROVIDER_SITE_OTHER): Payer: Medicare HMO | Admitting: Family Medicine

## 2017-08-24 ENCOUNTER — Encounter: Payer: Self-pay | Admitting: Family Medicine

## 2017-08-24 VITALS — BP 172/74 | HR 88 | Temp 98.0°F | Resp 18 | Ht 69.0 in | Wt 167.0 lb

## 2017-08-24 DIAGNOSIS — I2510 Atherosclerotic heart disease of native coronary artery without angina pectoris: Secondary | ICD-10-CM | POA: Diagnosis not present

## 2017-08-24 DIAGNOSIS — R51 Headache: Secondary | ICD-10-CM | POA: Diagnosis not present

## 2017-08-24 DIAGNOSIS — R519 Headache, unspecified: Secondary | ICD-10-CM

## 2017-08-24 DIAGNOSIS — R7303 Prediabetes: Secondary | ICD-10-CM

## 2017-08-24 DIAGNOSIS — I10 Essential (primary) hypertension: Secondary | ICD-10-CM | POA: Diagnosis not present

## 2017-08-24 DIAGNOSIS — J321 Chronic frontal sinusitis: Secondary | ICD-10-CM | POA: Diagnosis not present

## 2017-08-24 DIAGNOSIS — E7800 Pure hypercholesterolemia, unspecified: Secondary | ICD-10-CM

## 2017-08-24 MED ORDER — LOSARTAN POTASSIUM 50 MG PO TABS: 50.0000 mg | ORAL_TABLET | Freq: Every day | ORAL | 3 refills | Status: DC

## 2017-08-24 NOTE — Progress Notes (Signed)
Subjective:    Patient ID: Andrew [name], male    DOB: 1940-11-07, 76 y.o.   MRN: [id number]  HPI Patient is here for follow up of his prediabetes and hypertension.  Most recent lab work is listed below: Appointment on 08/16/2017  Component Date Value Ref Range Status  . Glucose, Bld 08/16/2017 134* 65 - 99 mg/dL Final   Comment: .            Fasting reference interval . For someone without known diabetes, a glucose value >125 mg/dL indicates that they may have diabetes and this should be confirmed with a follow-up test. .   . BUN 08/16/2017 19  7 - 25 mg/dL Final  . Creat 08/16/2017 0.84  0.70 - 1.18 mg/dL Final   Comment: For patients >38 years of age, the reference limit for Creatinine is approximately 13% higher for people identified as African-American. .   . GFR, Est Non African American 08/16/2017 84  > OR = 60 mL/min/1.74m2 Final  . GFR, Est African American 08/16/2017 97  > OR = 60 mL/min/1.38m2 Final  . BUN/Creatinine Ratio 40/98/1191 NOT APPLICABLE  6 - 22 (calc) Final  . Sodium 08/16/2017 139  135 - 146 mmol/L Final  . Potassium 08/16/2017 4.1  3.5 - 5.3 mmol/L Final  . Chloride 08/16/2017 103  98 - 110 mmol/L Final  . CO2 08/16/2017 27  20 - 32 mmol/L Final  . Calcium 08/16/2017 9.0  8.6 - 10.3 mg/dL Final  . Total Protein 08/16/2017 6.1  6.1 - 8.1 g/dL Final  . Albumin 08/16/2017 4.1  3.6 - 5.1 g/dL Final  . Globulin 08/16/2017 2.0  1.9 - 3.7 g/dL (calc) Final  . AG Ratio 08/16/2017 2.1  1.0 - 2.5 (calc) Final  . Total Bilirubin 08/16/2017 0.6  0.2 - 1.2 mg/dL Final  . Alkaline phosphatase (APISO) 08/16/2017 59  40 - 115 U/L Final  . AST 08/16/2017 13  10 - 35 U/L Final  . ALT 08/16/2017 12  9 - 46 U/L Final  . Cholesterol 08/16/2017 111  <200 mg/dL Final  . HDL 08/16/2017 39* >40 mg/dL Final  . Triglycerides 08/16/2017 89  <150 mg/dL Final  . LDL Cholesterol (Calc) 08/16/2017 55  mg/dL (calc) Final   Comment: Reference range: <100 . Desirable range <100  mg/dL for primary prevention;   <70 mg/dL for patients with CHD or diabetic patients  with > or = 2 CHD risk factors. [name] LDL-C is now calculated using the Martin-Hopkins  calculation, which is a validated novel method providing  better accuracy than the Friedewald equation in the  estimation of LDL-C.  Cresenciano Genre et al. Annamaria Helling. 4782;956(21): 2061-2068  (http://education.[facility].com/faq/FAQ164)   . Total CHOL/HDL Ratio 08/16/2017 2.8  <5.0 (calc) Final  . Non-HDL Cholesterol (Calc) 08/16/2017 72  <130 mg/dL (calc) Final   Comment: For patients with diabetes plus 1 major ASCVD risk  factor, treating to a non-HDL-C goal of <100 mg/dL  (LDL-C of <70 mg/dL) is considered a therapeutic  option.   . WBC 08/16/2017 8.0  3.8 - 10.8 Thousand/uL Final  . RBC 08/16/2017 4.82  4.20 - 5.80 Million/uL Final  . Hemoglobin 08/16/2017 14.5  13.2 - 17.1 g/dL Final  . HCT 08/16/2017 41.8  38.5 - 50.0 % Final  . MCV 08/16/2017 86.7  80.0 - 100.0 fL Final  . MCH 08/16/2017 30.1  27.0 - 33.0 pg Final  . MCHC 08/16/2017 34.7  32.0 - 36.0 g/dL Final  . RDW  08/16/2017 12.1  11.0 - 15.0 % Final  . Platelets 08/16/2017 125* 140 - 400 Thousand/uL Final  . MPV 08/16/2017 10.5  7.5 - 12.5 fL Final  . Neutro Abs 08/16/2017 5,528  1,500 - 7,800 cells/uL Final  . Lymphs Abs 08/16/2017 1,680  850 - 3,900 cells/uL Final  . WBC mixed population 08/16/2017 672  200 - 950 cells/uL Final  . Eosinophils Absolute 08/16/2017 72  15 - 500 cells/uL Final  . Basophils Absolute 08/16/2017 48  0 - 200 cells/uL Final  . Neutrophils Relative % 08/16/2017 69.1  % Final  . Total Lymphocyte 08/16/2017 21.0  % Final  . Monocytes Relative 08/16/2017 8.4  % Final  . Eosinophils Relative 08/16/2017 0.9  % Final  . Basophils Relative 08/16/2017 0.6  % Final  . Smear Review 08/16/2017    Final   Comment: Review of the peripheral smear reveals adequate numbers of platelets. Review of peripheral smear confirms automated  results.   . Hgb A1c MFr Bld 08/16/2017 6.3* <5.7 % of total Hgb Final   Comment: For someone without known diabetes, a hemoglobin  A1c value between 5.7% and 6.4% is consistent with prediabetes and should be confirmed with a  follow-up test. . For someone with known diabetes, a value <7% indicates that their diabetes is well controlled. A1c targets should be individualized based on duration of diabetes, age, comorbid conditions, and other considerations. . This assay result is consistent with an increased risk of diabetes. . Currently, no consensus exists regarding use of hemoglobin A1c for diagnosis of diabetes for children. .   . Mean Plasma Glucose 08/16/2017 134  (calc) Final  . eAG (mmol/L) 08/16/2017 7.4  (calc) Final   He reports chronic daily headache.  States the headache is in his frontal sinus area.  It is been present for 3 months.  It is not getting better despite antibiotics and time and allergy medication.  Recently he fell and struck the front part of his head however there has been no significant change in his headache after the fall.  He does have some bruising around his right eye.  He denies any neurologic deficits or vision loss.  He denies any numbness or tingling in his extremities.  He denies any blurry vision or double vision.  His blood pressure is very high today.  I confirmed this myself.  He denies any chest pain shortness of breath or dyspnea on exertion.  He denies any myalgias or right upper quadrant pain Past Medical History:  Diagnosis Date  . Allergy    grass etc.  . Barrett's esophagus   . BPH (benign prostatic hyperplasia)   . Chest pain, atypical   . Chronic kidney disease    kidney stones  . Coronary artery disease   . GERD (gastroesophageal reflux disease)    Barrett's  . Hernia   . Hyperlipidemia   . Hypertension   . Myocardial infarction ([place])   . Ulcer 1960   Past Surgical History:  Procedure Laterality Date  . CARDIAC  CATHETERIZATION  02/14/2007   MITRAL REGURGITATION. LV NORMAL  . CHOLECYSTECTOMY    . COLONOSCOPY    . CORONARY ARTERY BYPASS GRAFT     twice  2 by passes each time  . INGUINAL HERNIA REPAIR     bilateral done twice  . LITHOTRIPSY    . UPPER GASTROINTESTINAL ENDOSCOPY     Current Outpatient Medications on File Prior to Visit  Medication Sig Dispense Refill  . aspirin  81 MG tablet Take 81 mg by mouth daily.      . cetirizine (ZYRTEC) 10 MG tablet Take 1 tablet (10 mg total) by mouth daily. 90 tablet 3  . Cholecalciferol (VITAMIN D) 2000 units CAPS Take 1 capsule by mouth daily.    . Dutasteride-Tamsulosin HCl (JALYN) 0.5-0.4 MG CAPS Take by mouth daily.      . lansoprazole (PREVACID) 30 MG capsule Take 30 mg by mouth daily.     . metoprolol succinate (TOPROL-XL) 25 MG 24 hr tablet take 1 tablet by mouth once daily 90 tablet 3  . Multiple Vitamin (MULTIVITAMIN) tablet Take 1 tablet by mouth daily.      . nitroGLYCERIN (NITROSTAT) 0.4 MG SL tablet Place 1 tablet (0.4 mg total) under the tongue every 5 (five) minutes as needed. 25 tablet 6  . Omega-3 Fatty Acids (FISH OIL) 1000 MG CPDR Take 1,000 mg by mouth daily.      . simvastatin (ZOCOR) 10 MG tablet TAKE 1 TABLET AT BEDTIME 90 tablet 3   No current facility-administered medications on file prior to visit.    Allergies  Allergen Reactions  . Imdur [Isosorbide Mononitrate]     Unknown, per pt   . Meloxicam     Facial swelling    Social History   Socioeconomic History  . Marital status: Married    Spouse name: Not on file  . Number of children: Not on file  . Years of education: Not on file  . Highest education level: Not on file  Social Needs  . Financial resource strain: Not on file  . Food insecurity - worry: Not on file  . Food insecurity - inability: Not on file  . Transportation needs - medical: Not on file  . Transportation needs - non-medical: Not on file  Occupational History  . Not on file  Tobacco Use  .  Smoking status: Never Smoker  . Smokeless tobacco: Never Used  Substance and Sexual Activity  . Alcohol use: No  . Drug use: No  . Sexual activity: Not on file  Other Topics Concern  . Not on file  Social History Narrative  . Not on file   Family History  Problem Relation Age of Onset  . Heart disease Father       Review of Systems  All other systems reviewed and are negative.      Objective:   Physical Exam  Constitutional: He appears well-developed and well-nourished.  Neck: Neck supple. No JVD present. No thyromegaly present.  Cardiovascular: Normal rate, regular rhythm and normal heart sounds.  No murmur heard. Pulmonary/Chest: Effort normal and breath sounds normal. No respiratory distress. He has no wheezes. He has no rales.  Abdominal: Soft. Bowel sounds are normal. He exhibits no distension. There is no tenderness. There is no rebound.  Lymphadenopathy:    He has no cervical adenopathy.  Vitals reviewed.         Assessment & Plan:   Chronic frontal sinusitis  Chronic daily headache  Prediabetes  Pure hypercholesterolemia  ASCVD (arteriosclerotic cardiovascular disease)  Benign essential HTN  I am concerned by the headache that is persisted for 3 months despite conservative measures.  I would like to get a CT scan of the head to rule out other causes of chronic frontal headache.  I am concerned by his blood pressure.  I will add losartan 50 mg p.o. daily and recheck blood pressure in 1 month.  His prediabetes test is still adequately controlled.  Although I did recommend he decrease his consumption of carbohydrates.  His cholesterol is outstanding.  There is no evidence that he suffered a concussion.  He does have some bruising after his fall.  This should improve gradually with time over the next week.

## 2017-08-30 ENCOUNTER — Telehealth: Payer: Self-pay | Admitting: Family Medicine

## 2017-08-30 NOTE — Telephone Encounter (Signed)
Advised patient wife that it is ok to take Mucinex as long as he continues to monitor BP.

## 2017-08-30 NOTE — Telephone Encounter (Signed)
Pt wants to know if it is okay to take [facility] (mucenex) brand?

## 2017-08-31 ENCOUNTER — Encounter: Payer: Self-pay | Admitting: Family Medicine

## 2017-08-31 ENCOUNTER — Ambulatory Visit (INDEPENDENT_AMBULATORY_CARE_PROVIDER_SITE_OTHER): Payer: Medicare HMO | Admitting: Family Medicine

## 2017-08-31 VITALS — BP 118/60 | HR 70 | Temp 97.7°F | Resp 14 | Ht 69.0 in | Wt 164.0 lb

## 2017-08-31 DIAGNOSIS — J321 Chronic frontal sinusitis: Secondary | ICD-10-CM | POA: Diagnosis not present

## 2017-08-31 DIAGNOSIS — R05 Cough: Secondary | ICD-10-CM | POA: Diagnosis not present

## 2017-08-31 DIAGNOSIS — R059 Cough, unspecified: Secondary | ICD-10-CM

## 2017-08-31 MED ORDER — HYDROCODONE-HOMATROPINE 5-1.5 MG/5ML PO SYRP: 5.0000 mL | ORAL_SOLUTION | Freq: Three times a day (TID) | ORAL | 0 refills | Status: DC | PRN

## 2017-08-31 NOTE — Progress Notes (Signed)
Subjective:    Patient ID: Andrew [name], male    DOB: Jan 19, 1941, 76 y.o.   MRN: [id number]  HPI Patient is here for follow up of his prediabetes and hypertension.  Most recent lab work is listed below: Appointment on 08/16/2017  Component Date Value Ref Range Status  . Glucose, Bld 08/16/2017 134* 65 - 99 mg/dL Final   Comment: .            Fasting reference interval . For someone without known diabetes, a glucose value >125 mg/dL indicates that they may have diabetes and this should be confirmed with a follow-up test. .   . BUN 08/16/2017 19  7 - 25 mg/dL Final  . Creat 08/16/2017 0.84  0.70 - 1.18 mg/dL Final   Comment: For patients >28 years of age, the reference limit for Creatinine is approximately 13% higher for people identified as African-American. .   . GFR, Est Non African American 08/16/2017 84  > OR = 60 mL/min/1.20m2 Final  . GFR, Est African American 08/16/2017 97  > OR = 60 mL/min/1.65m2 Final  . BUN/Creatinine Ratio 33/82/5053 NOT APPLICABLE  6 - 22 (calc) Final  . Sodium 08/16/2017 139  135 - 146 mmol/L Final  . Potassium 08/16/2017 4.1  3.5 - 5.3 mmol/L Final  . Chloride 08/16/2017 103  98 - 110 mmol/L Final  . CO2 08/16/2017 27  20 - 32 mmol/L Final  . Calcium 08/16/2017 9.0  8.6 - 10.3 mg/dL Final  . Total Protein 08/16/2017 6.1  6.1 - 8.1 g/dL Final  . Albumin 08/16/2017 4.1  3.6 - 5.1 g/dL Final  . Globulin 08/16/2017 2.0  1.9 - 3.7 g/dL (calc) Final  . AG Ratio 08/16/2017 2.1  1.0 - 2.5 (calc) Final  . Total Bilirubin 08/16/2017 0.6  0.2 - 1.2 mg/dL Final  . Alkaline phosphatase (APISO) 08/16/2017 59  40 - 115 U/L Final  . AST 08/16/2017 13  10 - 35 U/L Final  . ALT 08/16/2017 12  9 - 46 U/L Final  . Cholesterol 08/16/2017 111  <200 mg/dL Final  . HDL 08/16/2017 39* >40 mg/dL Final  . Triglycerides 08/16/2017 89  <150 mg/dL Final  . LDL Cholesterol (Calc) 08/16/2017 55  mg/dL (calc) Final   Comment: Reference range: <100 . Desirable range <100  mg/dL for primary prevention;   <70 mg/dL for patients with CHD or diabetic patients  with > or = 2 CHD risk factors. [name] LDL-C is now calculated using the Martin-Hopkins  calculation, which is a validated novel method providing  better accuracy than the Friedewald equation in the  estimation of LDL-C.  Cresenciano Genre et al. Annamaria Helling. 9767;341(93): 2061-2068  (http://education.[facility].com/faq/FAQ164)   . Total CHOL/HDL Ratio 08/16/2017 2.8  <5.0 (calc) Final  . Non-HDL Cholesterol (Calc) 08/16/2017 72  <130 mg/dL (calc) Final   Comment: For patients with diabetes plus 1 major ASCVD risk  factor, treating to a non-HDL-C goal of <100 mg/dL  (LDL-C of <70 mg/dL) is considered a therapeutic  option.   . WBC 08/16/2017 8.0  3.8 - 10.8 Thousand/uL Final  . RBC 08/16/2017 4.82  4.20 - 5.80 Million/uL Final  . Hemoglobin 08/16/2017 14.5  13.2 - 17.1 g/dL Final  . HCT 08/16/2017 41.8  38.5 - 50.0 % Final  . MCV 08/16/2017 86.7  80.0 - 100.0 fL Final  . MCH 08/16/2017 30.1  27.0 - 33.0 pg Final  . MCHC 08/16/2017 34.7  32.0 - 36.0 g/dL Final  . RDW  08/16/2017 12.1  11.0 - 15.0 % Final  . Platelets 08/16/2017 125* 140 - 400 Thousand/uL Final  . MPV 08/16/2017 10.5  7.5 - 12.5 fL Final  . Neutro Abs 08/16/2017 5,528  1,500 - 7,800 cells/uL Final  . Lymphs Abs 08/16/2017 1,680  850 - 3,900 cells/uL Final  . WBC mixed population 08/16/2017 672  200 - 950 cells/uL Final  . Eosinophils Absolute 08/16/2017 72  15 - 500 cells/uL Final  . Basophils Absolute 08/16/2017 48  0 - 200 cells/uL Final  . Neutrophils Relative % 08/16/2017 69.1  % Final  . Total Lymphocyte 08/16/2017 21.0  % Final  . Monocytes Relative 08/16/2017 8.4  % Final  . Eosinophils Relative 08/16/2017 0.9  % Final  . Basophils Relative 08/16/2017 0.6  % Final  . Smear Review 08/16/2017    Final   Comment: Review of the peripheral smear reveals adequate numbers of platelets. Review of peripheral smear confirms automated  results.   . Hgb A1c MFr Bld 08/16/2017 6.3* <5.7 % of total Hgb Final   Comment: For someone without known diabetes, a hemoglobin  A1c value between 5.7% and 6.4% is consistent with prediabetes and should be confirmed with a  follow-up test. . For someone with known diabetes, a value <7% indicates that their diabetes is well controlled. A1c targets should be individualized based on duration of diabetes, age, comorbid conditions, and other considerations. . This assay result is consistent with an increased risk of diabetes. . Currently, no consensus exists regarding use of hemoglobin A1c for diagnosis of diabetes for children. .   . Mean Plasma Glucose 08/16/2017 134  (calc) Final  . eAG (mmol/L) 08/16/2017 7.4  (calc) Final   He reports chronic daily headache.  States the headache is in his frontal sinus area.  It is been present for 3 months.  It is not getting better despite antibiotics and time and allergy medication.  Recently he fell and struck the front part of his head however there has been no significant change in his headache after the fall.  He does have some bruising around his right eye.  He denies any neurologic deficits or vision loss.  He denies any numbness or tingling in his extremities.  He denies any blurry vision or double vision.  His blood pressure is very high today.  I confirmed this myself.  He denies any chest pain shortness of breath or dyspnea on exertion.  He denies any myalgias or right upper quadrant pain.  At that time, my plan was: I am concerned by the headache that is persisted for 3 months despite conservative measures.  I would like to get a CT scan of the head to rule out other causes of chronic frontal headache.  I am concerned by his blood pressure.  I will add losartan 50 mg p.o. daily and recheck blood pressure in 1 month.  His prediabetes test is still adequately controlled.  Although I did recommend he decrease his consumption of carbohydrates.   His cholesterol is outstanding.  There is no evidence that he suffered a concussion.  He does have some bruising after his fall.  This should improve gradually with time over the next week.  08/31/17 Apparently, I forgot to order the CT scan of his last visit.  Therefore I will place that order today to evaluate for chronic sinusitis.  He continues to complain of constant pressure-like pain above both eyes constant postnasal drip and constant rhinorrhea.  On Friday, he  developed a cough that is nonproductive.  The cough of a tickle-like sensation in the back of his throat.  He has been taking guaifenesin, dextromethorphan, and phenylephrine regularly with little to no improvement in his cough.  He denies any chest pain.  He denies any shortness of breath.  He denies any dyspnea on exertion.  He denies any hemoptysis or fever.  Exam today is completely normal aside from some clear rhinorrhea Past Medical History:  Diagnosis Date  . Allergy    grass etc.  . Barrett's esophagus   . BPH (benign prostatic hyperplasia)   . Chest pain, atypical   . Chronic kidney disease    kidney stones  . Coronary artery disease   . GERD (gastroesophageal reflux disease)    Barrett's  . Hernia   . Hyperlipidemia   . Hypertension   . Myocardial infarction ([place])   . Ulcer 1960   Past Surgical History:  Procedure Laterality Date  . CARDIAC CATHETERIZATION  02/14/2007   MITRAL REGURGITATION. LV NORMAL  . CHOLECYSTECTOMY    . COLONOSCOPY    . CORONARY ARTERY BYPASS GRAFT     twice  2 by passes each time  . INGUINAL HERNIA REPAIR     bilateral done twice  . LITHOTRIPSY    . UPPER GASTROINTESTINAL ENDOSCOPY     Current Outpatient Medications on File Prior to Visit  Medication Sig Dispense Refill  . aspirin 81 MG tablet Take 81 mg by mouth daily.      . cetirizine (ZYRTEC) 10 MG tablet Take 1 tablet (10 mg total) by mouth daily. 90 tablet 3  . Cholecalciferol (VITAMIN D) 2000 units CAPS Take 1 capsule by  mouth daily.    . Dutasteride-Tamsulosin HCl (JALYN) 0.5-0.4 MG CAPS Take by mouth daily.      . lansoprazole (PREVACID) 30 MG capsule Take 30 mg by mouth daily.     [name] losartan (COZAAR) 50 MG tablet Take 1 tablet (50 mg total) daily by mouth. 90 tablet 3  . metoprolol succinate (TOPROL-XL) 25 MG 24 hr tablet take 1 tablet by mouth once daily 90 tablet 3  . Multiple Vitamin (MULTIVITAMIN) tablet Take 1 tablet by mouth daily.      . nitroGLYCERIN (NITROSTAT) 0.4 MG SL tablet Place 1 tablet (0.4 mg total) under the tongue every 5 (five) minutes as needed. 25 tablet 6  . Omega-3 Fatty Acids (FISH OIL) 1000 MG CPDR Take 1,000 mg by mouth daily.      . simvastatin (ZOCOR) 10 MG tablet TAKE 1 TABLET AT BEDTIME 90 tablet 3   No current facility-administered medications on file prior to visit.    Allergies  Allergen Reactions  . Imdur [Isosorbide Mononitrate]     Unknown, per pt   . Meloxicam     Facial swelling    Social History   Socioeconomic History  . Marital status: Married    Spouse name: Not on file  . Number of children: Not on file  . Years of education: Not on file  . Highest education level: Not on file  Social Needs  . Financial resource strain: Not on file  . Food insecurity - worry: Not on file  . Food insecurity - inability: Not on file  . Transportation needs - medical: Not on file  . Transportation needs - non-medical: Not on file  Occupational History  . Not on file  Tobacco Use  . Smoking status: Never Smoker  . Smokeless tobacco: Never Used  Substance  and Sexual Activity  . Alcohol use: No  . Drug use: No  . Sexual activity: Not on file  Other Topics Concern  . Not on file  Social History Narrative  . Not on file   Family History  Problem Relation Age of Onset  . Heart disease Father       Review of Systems  All other systems reviewed and are negative.      Objective:   Physical Exam  Constitutional: He appears well-developed and  well-nourished.  Neck: Neck supple. No JVD present. No thyromegaly present.  Cardiovascular: Normal rate, regular rhythm and normal heart sounds.  No murmur heard. Pulmonary/Chest: Effort normal and breath sounds normal. No respiratory distress. He has no wheezes. He has no rales.  Abdominal: Soft. Bowel sounds are normal. He exhibits no distension. There is no tenderness. There is no rebound.  Lymphadenopathy:    He has no cervical adenopathy.  Vitals reviewed.         Assessment & Plan:   Chronic frontal sinusitis - Plan: CT Head W Contrast  Cough  I believe his cough is secondary to a viral upper respiratory infection on top of chronic sinusitis.  Continue Robitussin-DM.  Avoid phenylephrine due to the risk of urinary retention with his BPH as well as hypertension exacerbation.  He can add Hycodan 1 teaspoon every 8 hours as needed for cough.  Recommended tincture of time.  Symptoms should gradually improve over the next week.  I see no indication for antibiotics at the present time.  Should he develop fever, purulent sputum, shortness of breath, etc., I would want him to be re-seen immediately.  Meanwhile I will schedule the CT scan of his head for chronic sinusitis and his chronic frontal daily headache.

## 2017-09-08 ENCOUNTER — Telehealth: Payer: Self-pay | Admitting: Family Medicine

## 2017-09-08 DIAGNOSIS — J321 Chronic frontal sinusitis: Secondary | ICD-10-CM

## 2017-09-08 DIAGNOSIS — R51 Headache: Secondary | ICD-10-CM

## 2017-09-08 DIAGNOSIS — R519 Headache, unspecified: Secondary | ICD-10-CM

## 2017-09-08 NOTE — Telephone Encounter (Signed)
Finally got approval for CT Head w/contrast.  Went to schedule.  Imaging says for sinusitis and stated symptoms needs to be CT max/facial w/o or w/wo.  CT max/facial w/wo ordered.

## 2017-09-08 NOTE — Telephone Encounter (Signed)
He had sinusitis and frontal headache. We will see what sinus CT shows first

## 2017-09-14 ENCOUNTER — Other Ambulatory Visit: Payer: Self-pay | Admitting: Family Medicine

## 2017-09-14 DIAGNOSIS — G8929 Other chronic pain: Secondary | ICD-10-CM

## 2017-09-14 DIAGNOSIS — R51 Headache: Secondary | ICD-10-CM

## 2017-09-14 DIAGNOSIS — J321 Chronic frontal sinusitis: Secondary | ICD-10-CM

## 2017-09-14 DIAGNOSIS — R519 Headache, unspecified: Secondary | ICD-10-CM

## 2017-09-17 ENCOUNTER — Ambulatory Visit (HOSPITAL_COMMUNITY): Admission: RE | Admit: 2017-09-17 | Payer: Medicare HMO | Source: Ambulatory Visit

## 2017-09-17 ENCOUNTER — Encounter (HOSPITAL_COMMUNITY): Payer: Self-pay

## 2017-09-17 ENCOUNTER — Other Ambulatory Visit: Payer: Self-pay | Admitting: Family Medicine

## 2017-09-17 ENCOUNTER — Ambulatory Visit (HOSPITAL_COMMUNITY)
Admission: RE | Admit: 2017-09-17 | Discharge: 2017-09-17 | Disposition: A | Discharge status: Home / Self Care | Payer: Medicare HMO | Source: Ambulatory Visit | Attending: Family Medicine | Admitting: Family Medicine

## 2017-09-17 DIAGNOSIS — R51 Headache: Secondary | ICD-10-CM | POA: Insufficient documentation

## 2017-09-17 DIAGNOSIS — J321 Chronic frontal sinusitis: Secondary | ICD-10-CM

## 2017-09-17 DIAGNOSIS — J328 Other chronic sinusitis: Secondary | ICD-10-CM | POA: Insufficient documentation

## 2017-09-17 DIAGNOSIS — R519 Headache, unspecified: Secondary | ICD-10-CM

## 2017-09-17 MED ORDER — IOPAMIDOL (ISOVUE-300) INJECTION 61%
75.0000 mL | Freq: Once | INTRAVENOUS | Status: AC | PRN
  Administered 2017-09-17: 75 mL via INTRAVENOUS

## 2017-09-17 MED ORDER — PREDNISONE 20 MG PO TABS: 40.0000 mg | ORAL_TABLET | Freq: Every day | ORAL | 0 refills | Status: DC

## 2017-09-17 MED ORDER — CEFDINIR 300 MG PO CAPS: 300.0000 mg | ORAL_CAPSULE | Freq: Two times a day (BID) | ORAL | 0 refills | Status: DC

## 2017-09-20 ENCOUNTER — Inpatient Hospital Stay (HOSPITAL_COMMUNITY): Admission: RE | Admit: 2017-09-20 | Payer: Medicare HMO | Source: Ambulatory Visit

## 2017-09-23 ENCOUNTER — Ambulatory Visit (INDEPENDENT_AMBULATORY_CARE_PROVIDER_SITE_OTHER): Payer: Medicare HMO | Admitting: Family Medicine

## 2017-09-23 ENCOUNTER — Other Ambulatory Visit: Payer: Self-pay

## 2017-09-23 ENCOUNTER — Other Ambulatory Visit: Payer: Self-pay | Admitting: Family Medicine

## 2017-09-23 ENCOUNTER — Encounter: Payer: Self-pay | Admitting: Family Medicine

## 2017-09-23 VITALS — BP 140/72 | HR 70 | Temp 98.2°F | Resp 14 | Ht 69.0 in | Wt 166.0 lb

## 2017-09-23 DIAGNOSIS — R109 Unspecified abdominal pain: Secondary | ICD-10-CM

## 2017-09-23 DIAGNOSIS — R1033 Periumbilical pain: Secondary | ICD-10-CM

## 2017-09-23 DIAGNOSIS — J328 Other chronic sinusitis: Secondary | ICD-10-CM

## 2017-09-23 NOTE — Progress Notes (Signed)
Subjective:    Patient ID: Andrew [name], male    DOB: 1941/04/20, 76 y.o.   MRN: [id number]  HPI Patient presents today with a sudden onset of low back pain radiating into his abdomen around the level of the umbilicus.  He describes the pain as a sharp intense pain that starts in the center of his back and bore straight through his abdomen to his bellybutton.  He denies any injury or fall or explanation for the pain.  Nothing seems to trigger the pain.  The pain is not brought on or made worse by flexion of the back or bending over or picking up objects.  There are no alleviating factors.  The pain comes and goes it will.  He denies any constipation.  He denies any diarrhea.  He denies any fevers or chills.  He denies any nausea or vomiting.  He denies any hematuria or dysuria.  He has a history of kidney stones.  He states that this pain feels much different than a kidney stone.  Furthermore the path of radiation is straight through from the back to the bellybutton.  There is no radiation into the groin.  Differential diagnosis includes herniated disc in his lumbar spine, AAA, kidney stone.  I believe this is most likely a musculoskeletal pain in his back.  However I cannot rule out a AAA today.  On exam today, there are no palpable pulsatile masses in his abdomen Past Medical History:  Diagnosis Date  . Allergy    grass etc.  . Barrett's esophagus   . BPH (benign prostatic hyperplasia)   . Chest pain, atypical   . Chronic kidney disease    kidney stones  . Coronary artery disease   . GERD (gastroesophageal reflux disease)    Barrett's  . Hernia   . Hyperlipidemia   . Hypertension   . Myocardial infarction ([place])   . Ulcer 1960   Past Surgical History:  Procedure Laterality Date  . CARDIAC CATHETERIZATION  02/14/2007   MITRAL REGURGITATION. LV NORMAL  . CHOLECYSTECTOMY    . COLONOSCOPY    . CORONARY ARTERY BYPASS GRAFT     twice  2 by passes each time  . INGUINAL HERNIA REPAIR     bilateral done twice  . LITHOTRIPSY    . UPPER GASTROINTESTINAL ENDOSCOPY     Current Outpatient Medications on File Prior to Visit  Medication Sig Dispense Refill  . aspirin 81 MG tablet Take 81 mg by mouth daily.      . cefdinir (OMNICEF) 300 MG capsule Take 1 capsule (300 mg total) by mouth 2 (two) times daily. 20 capsule 0  . cetirizine (ZYRTEC) 10 MG tablet Take 1 tablet (10 mg total) by mouth daily. 90 tablet 3  . Cholecalciferol (VITAMIN D) 2000 units CAPS Take 1 capsule by mouth daily.    . Dutasteride-Tamsulosin HCl (JALYN) 0.5-0.4 MG CAPS Take by mouth daily.      [name] HYDROcodone-homatropine (HYCODAN) 5-1.5 MG/5ML syrup Take 5 mLs by mouth every 8 (eight) hours as needed for cough. 120 mL 0  . lansoprazole (PREVACID) 30 MG capsule Take 30 mg by mouth daily.     [name] losartan (COZAAR) 50 MG tablet Take 1 tablet (50 mg total) daily by mouth. 90 tablet 3  . metoprolol succinate (TOPROL-XL) 25 MG 24 hr tablet take 1 tablet by mouth once daily 90 tablet 3  . Multiple Vitamin (MULTIVITAMIN) tablet Take 1 tablet by mouth daily.      [name]  nitroGLYCERIN (NITROSTAT) 0.4 MG SL tablet Place 1 tablet (0.4 mg total) under the tongue every 5 (five) minutes as needed. 25 tablet 6  . Omega-3 Fatty Acids (FISH OIL) 1000 MG CPDR Take 1,000 mg by mouth daily.      . predniSONE (DELTASONE) 20 MG tablet Take 2 tablets (40 mg total) by mouth daily with breakfast. X 7 days 14 tablet 0  . simvastatin (ZOCOR) 10 MG tablet TAKE 1 TABLET AT BEDTIME 90 tablet 3   No current facility-administered medications on file prior to visit.    Allergies  Allergen Reactions  . Imdur [Isosorbide Mononitrate]     Unknown, per pt   . Meloxicam     Facial swelling    Social History   Socioeconomic History  . Marital status: Married    Spouse name: Not on file  . Number of children: Not on file  . Years of education: Not on file  . Highest education level: Not on file  Social Needs  . Financial resource strain: Not on  file  . Food insecurity - worry: Not on file  . Food insecurity - inability: Not on file  . Transportation needs - medical: Not on file  . Transportation needs - non-medical: Not on file  Occupational History  . Not on file  Tobacco Use  . Smoking status: Never Smoker  . Smokeless tobacco: Never Used  Substance and Sexual Activity  . Alcohol use: No  . Drug use: No  . Sexual activity: Not on file  Other Topics Concern  . Not on file  Social History Narrative  . Not on file      Review of Systems  All other systems reviewed and are negative.      Objective:   Physical Exam  Cardiovascular: Normal rate, regular rhythm and normal heart sounds.  No murmur heard. Pulmonary/Chest: Effort normal and breath sounds normal. No respiratory distress. He has no wheezes. He has no rales.  Abdominal: Soft. Bowel sounds are normal. He exhibits no distension. There is no tenderness. There is no rebound and no guarding.  Musculoskeletal:       Lumbar back: He exhibits normal range of motion, no tenderness, no bony tenderness, no pain and no spasm.  Vitals reviewed.         Assessment & Plan:  Abdominal pain, unspecified abdominal location - Plan: VAS [place] AAA DUPLEX  I am concerned based on the patient's history about a possible AAA versus a kidney stone.  If this was a musculoskeletal pain in the back, movement should trigger the pain however movement seems to have no relation to the pain he is experiencing.  If this was a kidney stone, I would expect him to have hematuria or a different path of radiation.  This bores straight through from the center of his back bellybutton.  Therefore I am going to send the patient for an urgent ultrasound to evaluate for any evidence of a AAA.  There is no evidence of an acute abdomen today on exam.  In fact his abdomen is soft nondistended and nontender.  In fact the patient states he is not in any pain right now.  This pain will come and go randomly it  will.  If the ultrasound is completely normal, I believe this is benign cause such as a muscle strain in his back or possibly the early signs of a kidney stone.  We discussed this in detail and the patient would like to proceed  with an ultrasound to rule out AAA

## 2017-09-23 NOTE — Addendum Note (Signed)
Addended by: Shary Decamp B on: 09/23/2017 12:55 PM   Modules accepted: Orders

## 2017-09-24 ENCOUNTER — Ambulatory Visit: Payer: Medicare HMO | Admitting: Family Medicine

## 2017-09-24 ENCOUNTER — Ambulatory Visit
Admission: RE | Admit: 2017-09-24 | Discharge: 2017-09-24 | Disposition: A | Discharge status: Home / Self Care | Payer: Medicare HMO | Source: Ambulatory Visit | Attending: Family Medicine | Admitting: Family Medicine

## 2017-09-24 DIAGNOSIS — R109 Unspecified abdominal pain: Secondary | ICD-10-CM | POA: Diagnosis not present

## 2017-09-24 DIAGNOSIS — R1033 Periumbilical pain: Secondary | ICD-10-CM

## 2017-09-27 ENCOUNTER — Ambulatory Visit (HOSPITAL_COMMUNITY)
Admission: RE | Admit: 2017-09-27 | Discharge: 2017-09-27 | Disposition: A | Discharge status: Home / Self Care | Payer: Medicare HMO | Source: Ambulatory Visit | Attending: Internal Medicine | Admitting: Internal Medicine

## 2017-09-27 DIAGNOSIS — E785 Hyperlipidemia, unspecified: Secondary | ICD-10-CM | POA: Diagnosis not present

## 2017-09-27 DIAGNOSIS — I779 Disorder of arteries and arterioles, unspecified: Secondary | ICD-10-CM | POA: Diagnosis not present

## 2017-09-27 DIAGNOSIS — I10 Essential (primary) hypertension: Secondary | ICD-10-CM | POA: Diagnosis not present

## 2017-09-27 DIAGNOSIS — I739 Peripheral vascular disease, unspecified: Secondary | ICD-10-CM

## 2017-09-27 DIAGNOSIS — I2510 Atherosclerotic heart disease of native coronary artery without angina pectoris: Secondary | ICD-10-CM | POA: Diagnosis not present

## 2017-09-27 DIAGNOSIS — I6523 Occlusion and stenosis of bilateral carotid arteries: Secondary | ICD-10-CM | POA: Insufficient documentation

## 2017-09-29 ENCOUNTER — Other Ambulatory Visit: Payer: Self-pay

## 2017-09-29 ENCOUNTER — Ambulatory Visit (INDEPENDENT_AMBULATORY_CARE_PROVIDER_SITE_OTHER): Payer: Medicare HMO | Admitting: Physician Assistant

## 2017-09-29 ENCOUNTER — Encounter: Payer: Self-pay | Admitting: Cardiovascular Disease

## 2017-09-29 ENCOUNTER — Encounter: Payer: Self-pay | Admitting: Physician Assistant

## 2017-09-29 ENCOUNTER — Ambulatory Visit (INDEPENDENT_AMBULATORY_CARE_PROVIDER_SITE_OTHER): Payer: Medicare HMO | Admitting: Cardiovascular Disease

## 2017-09-29 VITALS — BP 114/68 | HR 81 | Temp 97.7°F | Resp 16 | Ht 69.0 in | Wt 164.2 lb

## 2017-09-29 VITALS — BP 110/64 | HR 76 | Ht 69.0 in | Wt 165.4 lb

## 2017-09-29 DIAGNOSIS — I2510 Atherosclerotic heart disease of native coronary artery without angina pectoris: Secondary | ICD-10-CM

## 2017-09-29 DIAGNOSIS — E785 Hyperlipidemia, unspecified: Secondary | ICD-10-CM | POA: Diagnosis not present

## 2017-09-29 DIAGNOSIS — I779 Disorder of arteries and arterioles, unspecified: Secondary | ICD-10-CM | POA: Diagnosis not present

## 2017-09-29 DIAGNOSIS — S46211A Strain of muscle, fascia and tendon of other parts of biceps, right arm, initial encounter: Secondary | ICD-10-CM | POA: Diagnosis not present

## 2017-09-29 DIAGNOSIS — I739 Peripheral vascular disease, unspecified: Secondary | ICD-10-CM

## 2017-09-29 MED ORDER — NITROGLYCERIN 0.4 MG SL SUBL: 0.4000 mg | SUBLINGUAL_TABLET | SUBLINGUAL | 6 refills | Status: DC | PRN

## 2017-09-29 NOTE — Progress Notes (Signed)
[name] Date of Birth  June 27, 1941 [facility] [address]    [place], [place]  [address] [phone]  Fax  [phone]   problem list 1. Coronary artery disease 2. Hyperlipidemia 3.  Previous notes.   Pasqualino is a 76 year old gentleman with a history of coronary artery disease. He status post coronary artery bypass grafting. He also has a history of dyslipidemia.  He's recently been having some problems with back pain.  These episodes of back pain would typically occur when he is doing his normal household chores. It would typically occur in the middle of his back and radiate up through to the front of his chest and between the shoulder blades.  This was not similar to his previous episodes of angina prior to his bypass grafting.  He's been exercising on a regular basis and has not had these symptoms with exercise.   Sept. 11. 2014:  Rally is doing ok.  No angina.  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  No   Sept. 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  Raised a garden this years.  No CP. Works out regularly.    Oct. 4, 2016: Doing great.    i reviewed his labs with him.   Everything is stable .  Still raising cattle.  Beef prices are down quite a bit this year.    Oct. 24, 2017:   Doing well Still raising cows.    No CP .    September 29, 2017:  Maxmilian is seen today.  He has a history of coronary artery disease and hyperlipidemia. No CP  Has some shortness of breath. Has had some lower back pain and abd.  Still raising cows .   Hurt his right arm while using his  tractor   His last heart catheterization was Feb 14, 2007. The left anterior descending artery is occluded.  The saphenous vein graft to the second diagonal artery is a large graft and fills the left anterior descending artery in a retrograde fashion.  There is a 90% stenosis in the mid/distal LAD. The LIMA is atretic and does not supply any significant flow to the  LAD. Left circumflex artery has minor luminal irregularities.  The left circumflex artery is dominant.  Current Outpatient Medications on File Prior to Visit  Medication Sig Dispense Refill  . aspirin 81 MG tablet Take 81 mg by mouth daily.      . cetirizine (ZYRTEC) 10 MG tablet Take 1 tablet (10 mg total) by mouth daily. 90 tablet 3  . Cholecalciferol (VITAMIN D) 2000 units CAPS Take 1 capsule by mouth daily.    . Dutasteride-Tamsulosin HCl (JALYN) 0.5-0.4 MG CAPS Take by mouth daily.      [name] HYDROcodone-homatropine (HYCODAN) 5-1.5 MG/5ML syrup Take 5 mLs by mouth every 8 (eight) hours as needed for cough. 120 mL 0  . lansoprazole (PREVACID) 30 MG capsule Take 30 mg by mouth daily.     [name] losartan (COZAAR) 50 MG tablet Take 1 tablet (50 mg total) daily by mouth. 90 tablet 3  . metoprolol succinate (TOPROL-XL) 25 MG 24 hr tablet take 1 tablet by mouth once daily 90 tablet 3  . Multiple Vitamin (MULTIVITAMIN) tablet Take 1 tablet by mouth daily.      . nitroGLYCERIN (NITROSTAT) 0.4 MG SL tablet Place 1 tablet (0.4 mg total) under the tongue every 5 (five) minutes as needed. 25 tablet 6  . Omega-3 Fatty Acids (  FISH OIL) 1000 MG CPDR Take 1,000 mg by mouth daily.      . simvastatin (ZOCOR) 10 MG tablet TAKE 1 TABLET AT BEDTIME 90 tablet 3   No current facility-administered medications on file prior to visit.     Allergies  Allergen Reactions  . Imdur [Isosorbide Mononitrate]     Unknown, per pt   . Meloxicam     Facial swelling     Past Medical History:  Diagnosis Date  . Allergy    grass etc.  . Barrett's esophagus   . BPH (benign prostatic hyperplasia)   . Chest pain, atypical   . Chronic kidney disease    kidney stones  . Coronary artery disease   . GERD (gastroesophageal reflux disease)    Barrett's  . Hernia   . Hyperlipidemia   . Hypertension   . Myocardial infarction ([place])   . Ulcer 1960    Past Surgical History:  Procedure Laterality Date  . CARDIAC  CATHETERIZATION  02/14/2007   MITRAL REGURGITATION. LV NORMAL  . CHOLECYSTECTOMY    . COLONOSCOPY    . CORONARY ARTERY BYPASS GRAFT     twice  2 by passes each time  . INGUINAL HERNIA REPAIR     bilateral done twice  . LITHOTRIPSY    . UPPER GASTROINTESTINAL ENDOSCOPY      Social History   Tobacco Use  Smoking Status Never Smoker  Smokeless Tobacco Never Used    Social History   Substance and Sexual Activity  Alcohol Use No    Family History  Problem Relation Age of Onset  . Heart disease Father     Reviw of Systems:  Reviewed in the HPI.  All other systems are negative.  Physical Exam: Blood pressure 110/64, pulse 76, height 5\' 9"  (1.753 m), weight 165 lb 6.4 oz (75 kg), SpO2 95 %.  GEN:  Well nourished, well developed in no acute distress HEENT: Normal NECK: No JVD; No carotid bruits LYMPHATICS: No lymphadenopathy CARDIAC: RR, no murmurs, rubs, gallops RESPIRATORY:  Clear to auscultation without rales, wheezing or rhonchi  ABDOMEN: Soft, non-tender, non-distended MUSCULOSKELETAL:   Unable to lift right arm well ( recently tore his biceps tendon)  SKIN: Warm and dry NEUROLOGIC:  Alert and oriented x 3   ECG:   Sep 29, 2017:   NSR at 8.   Nonspecific ST and T wave changes.    Assessment / Plan:    1. Coronary artery disease- he had coronary artery bypass grafting and has an occluded left anterior descending artery.  The IMA is atretic.  The mid LAD fills via retrograde filling from the diagonal graft.  He is having occasional episodes of angina.  He has a tight stenosis in the mid/distal LAD.  I suspect this lesion is causing some of his angina.  2. Hyperlipidemia-   recent cholesterol levels and electrolytes drawn in November look great.  3. Carotid artery disease: Repeat duplex scan reveals mild bilateral carotid disease.  We will plan on repeating the study in about 2 years.   Mertie Moores, MD  09/29/2017 4:16 PM    [facility],  [address] [place], [place]  [address] Pager [phone] Phone: [phone]; Fax: [phone]

## 2017-09-29 NOTE — Patient Instructions (Signed)
Medication Instructions:  A REFILL WAS SENT IN FOR NTG   Labwork: NONE ORDERED TODAY  Testing/Procedures: NONE ORDERED TODAY  Follow-Up: .Your physician wants you to follow-up in: [place] DR. Acie Fredrickson  You will receive a reminder letter in the mail two months in advance. If you don't receive a letter, please call our office to schedule the follow-up appointment.   Any Other Special Instructions Will Be Listed Below (If Applicable).     If you need a refill on your cardiac medications before your next appointment, please call your pharmacy.

## 2017-09-29 NOTE — Progress Notes (Addendum)
Patient ID: Andrew [name] MRN: [id number], DOB: Oct 22, 1940, 76 y.o. Date of Encounter: 09/29/2017, 11:54 AM    Chief Complaint:  Chief Complaint  Patient presents with  . knot on right arm     HPI: 76 y.o. year old male presents with above.  He reports that this occurred when plowing snow. It snowed in the early morning of Sunday, December 9.   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   Says that it was the next day that he noticed a "bulge" at the bicep muscle on the right arm. States that the lever that lifts the snowplow is broken.  Says that "that lever wants to continue dropping down" --"so the entire time that he was scraping roads he was using his right arm to constantly pull up this lever/lift."  States that it is not painful.  States that he has not been lifting anything heavy since then.     Home Meds:   Outpatient Medications Prior to Visit  Medication Sig Dispense Refill  . aspirin 81 MG tablet Take 81 mg by mouth daily.      . cetirizine (ZYRTEC) 10 MG tablet Take 1 tablet (10 mg total) by mouth daily. 90 tablet 3  . Cholecalciferol (VITAMIN D) 2000 units CAPS Take 1 capsule by mouth daily.    . Dutasteride-Tamsulosin HCl (JALYN) 0.5-0.4 MG CAPS Take by mouth daily.      [name] HYDROcodone-homatropine (HYCODAN) 5-1.5 MG/5ML syrup Take 5 mLs by mouth every 8 (eight) hours as needed for cough. 120 mL 0  . lansoprazole (PREVACID) 30 MG capsule Take 30 mg by mouth daily.     [name] losartan (COZAAR) 50 MG tablet Take 1 tablet (50 mg total) daily by mouth. 90 tablet 3  . metoprolol succinate (TOPROL-XL) 25 MG 24 hr tablet take 1 tablet by mouth once daily 90 tablet 3  . Multiple Vitamin (MULTIVITAMIN) tablet Take 1 tablet by mouth daily.      . nitroGLYCERIN (NITROSTAT) 0.4 MG SL tablet Place 1 tablet (0.4 mg total) under the tongue every 5 (five) minutes as needed. 25 tablet 6  . Omega-3  Fatty Acids (FISH OIL) 1000 MG CPDR Take 1,000 mg by mouth daily.      . predniSONE (DELTASONE) 20 MG tablet Take 2 tablets (40 mg total) by mouth daily with breakfast. X 7 days 14 tablet 0  . simvastatin (ZOCOR) 10 MG tablet TAKE 1 TABLET AT BEDTIME 90 tablet 3  . cefdinir (OMNICEF) 300 MG capsule Take 1 capsule (300 mg total) by mouth 2 (two) times daily. 20 capsule 0   No facility-administered medications prior to visit.     Allergies:  Allergies  Allergen Reactions  . Imdur [Isosorbide Mononitrate]     Unknown, per pt   . Meloxicam     Facial swelling       Review of Systems: See HPI for pertinent ROS. All other ROS negative.    Physical Exam: Blood pressure 114/68, pulse 81, temperature 97.7 F (36.5 C), temperature source Oral, resp. rate 16, height 5\' 9"  (1.753 m), weight 74.5 kg (164 lb 3.2 oz), SpO2 98 %., Body mass index is 24.25 kg/m. General:  WNWD WM. Appears in no acute distress. Neck: Supple. No thyromegaly. No lymphadenopathy. Lungs: Clear bilaterally to auscultation without wheezes, rales, or rhonchi. Breathing is unlabored. Heart: Regular rhythm. No murmurs, rubs, or gallops.  Msk:  Right Arm: Bicep muscle is in a "lump" Extremities/Skin: Warm and dry.  Neuro: Alert and oriented X 3. Moves all extremities spontaneously. Gait is normal. CNII-XII grossly in tact. Psych:  Responds to questions appropriately with a normal affect.     ASSESSMENT AND PLAN:  76 y.o. year old male with  1. Rupture of right biceps tendon, initial encounter I discussed with him but I am quite certain that he has a bicep tendon rupture.   Discussed follow-up with orthopedic and he is agreeable.   Says " I'm going to have to be digging holes -- going to have to plant my fence posts and going to have to be able to pull the wire for my fence." He is to avoid any lifting pushing pulling until he sees orthopedics.  Voices understanding and agrees. - AMB referral to  orthopedics   Signed, Olean Ree [place], [place], [facility] 09/29/2017 11:54 AM

## 2017-09-30 ENCOUNTER — Ambulatory Visit: Payer: Medicare HMO | Admitting: Family Medicine

## 2017-09-30 ENCOUNTER — Ambulatory Visit (INDEPENDENT_AMBULATORY_CARE_PROVIDER_SITE_OTHER): Payer: Medicare HMO | Admitting: Orthopaedic Surgery

## 2017-09-30 ENCOUNTER — Encounter (INDEPENDENT_AMBULATORY_CARE_PROVIDER_SITE_OTHER): Payer: Self-pay | Admitting: Orthopaedic Surgery

## 2017-09-30 ENCOUNTER — Ambulatory Visit (INDEPENDENT_AMBULATORY_CARE_PROVIDER_SITE_OTHER): Payer: Medicare HMO

## 2017-09-30 DIAGNOSIS — S46211A Strain of muscle, fascia and tendon of other parts of biceps, right arm, initial encounter: Secondary | ICD-10-CM

## 2017-09-30 NOTE — Progress Notes (Signed)
Office Visit Note   Patient: Andrew [name]           Date of Birth: [birth year]           MRN: [id number] Visit Date: 09/30/2017              Requested by: Orlena Sheldon, PA-C [id number] [place], [place] [address] PCP: Susy Frizzle, MD   Assessment & Plan: Visit Diagnoses:  1. Rupture of right proximal biceps tendon, initial encounter     Plan: Impression is rupture of right proximal biceps tendon.  Recommend supportive treatment.  No heavy lifting for 6 weeks.  Increase activity as tolerated.  Questions encouraged and answered.  Follow-up as needed.  Follow-Up Instructions: Return if symptoms worsen or fail to improve.   Orders:  Orders Placed This Encounter  Procedures  . XR Humerus Right   No orders of the defined types were placed in this encounter.     Procedures: No procedures performed   Clinical Data: No additional findings.   Subjective: Chief Complaint  Patient presents with  . Right Upper Arm - Pain    Patient is a 76 year old gentleman who comes in with acute proximal biceps rupture from a week ago.  He was shoveling snow when he felt a pop in his shoulder and a resultant Popeye deformity with bruising and swelling.  He denies any significant pain.  Denies any elbow pain.  Denies any numbness and tingling.    Review of Systems  Constitutional: Negative.   All other systems reviewed and are negative.    Objective: Vital Signs: There were no vitals taken for this visit.  Physical Exam  Constitutional: He is oriented to person, place, and time. He appears well-developed and well-nourished.  HENT:  Head: Normocephalic and atraumatic.  Eyes: Pupils are equal, round, and reactive to light.  Neck: Neck supple.  Pulmonary/Chest: Effort normal.  Abdominal: Soft.  Musculoskeletal: Normal range of motion.  Neurological: He is alert and oriented to person, place, and time.  Skin: Skin is warm.  Psychiatric: He has a normal mood and  affect. His behavior is normal. Judgment and thought content normal.  Nursing note and vitals reviewed.   Ortho Exam Right upper extremity patient has a Popeye deformity with mild bruising and swelling.  Antecubital fossa is nontender.  Normal hook test.  Pain with palpation of the proximal biceps muscle belly. Specialty Comments:  No specialty comments available.  Imaging: Xr Humerus Right  Result Date: 09/30/2017 No acute or structural abnormalities    PMFS History: Patient Active Problem List   Diagnosis Date Noted  . Bilateral carotid artery disease ([place]) 08/04/2016  . Poor posture 02/22/2014  . Stiffness of joints, not elsewhere classified, multiple sites 02/22/2014  . Prediabetes 01/05/2013  . Special screening for malignant neoplasms, colon 04/27/2011  . Benign neoplasm of colon 04/27/2011  . GERD (gastroesophageal reflux disease) 04/27/2011  . Diverticulosis of colon (without mention of hemorrhage) 04/27/2011  . CAD (coronary artery disease) 12/27/2010  . Environmental allergies 12/27/2010  . Insomnia 12/27/2010  . H. pylori infection 12/27/2010  . Barrett's esophagus 12/27/2010  . Hyperlipidemia 11/08/2008  . HYPERTENSION 11/08/2008  . GERD 11/08/2008  . ACUT PEPTC ULCR UNS SITE W/HEM W/O MENTION OBST 11/08/2008  . RENAL CALCULUS 11/08/2008  . Obstructive sleep apnea 04/12/2003   Past Medical History:  Diagnosis Date  . Allergy    grass etc.  . Barrett's esophagus   . BPH (  benign prostatic hyperplasia)   . Chest pain, atypical   . Chronic kidney disease    kidney stones  . Coronary artery disease   . GERD (gastroesophageal reflux disease)    Barrett's  . Hernia   . Hyperlipidemia   . Hypertension   . Myocardial infarction ([place])   . Ulcer 1960    Family History  Problem Relation Age of Onset  . Heart disease Father     Past Surgical History:  Procedure Laterality Date  . CARDIAC CATHETERIZATION  02/14/2007   MITRAL REGURGITATION. LV NORMAL  .  CHOLECYSTECTOMY    . COLONOSCOPY    . CORONARY ARTERY BYPASS GRAFT     twice  2 by passes each time  . INGUINAL HERNIA REPAIR     bilateral done twice  . LITHOTRIPSY    . UPPER GASTROINTESTINAL ENDOSCOPY     Social History   Occupational History  . Not on file  Tobacco Use  . Smoking status: Never Smoker  . Smokeless tobacco: Never Used  Substance and Sexual Activity  . Alcohol use: No  . Drug use: No  . Sexual activity: Not on file

## 2017-10-06 DIAGNOSIS — J324 Chronic pansinusitis: Secondary | ICD-10-CM | POA: Diagnosis not present

## 2017-11-05 ENCOUNTER — Ambulatory Visit (INDEPENDENT_AMBULATORY_CARE_PROVIDER_SITE_OTHER): Payer: Medicare HMO | Admitting: Family Medicine

## 2017-11-05 ENCOUNTER — Encounter: Payer: Self-pay | Admitting: Family Medicine

## 2017-11-05 VITALS — BP 104/60 | HR 74 | Temp 98.2°F | Resp 16 | Ht 69.0 in | Wt 169.0 lb

## 2017-11-05 DIAGNOSIS — R109 Unspecified abdominal pain: Secondary | ICD-10-CM

## 2017-11-05 DIAGNOSIS — S46211A Strain of muscle, fascia and tendon of other parts of biceps, right arm, initial encounter: Secondary | ICD-10-CM | POA: Diagnosis not present

## 2017-11-05 NOTE — Progress Notes (Signed)
Subjective:    Patient ID: Andrew [name], male    DOB: [birth year], 77 y.o.   MRN: [id number]  HPI  09/23/17 Patient presents today with a sudden onset of low back pain radiating into his abdomen around the level of the umbilicus.  He describes the pain as a sharp intense pain that starts in the center of his back and bore straight through his abdomen to his bellybutton.  He denies any injury or fall or explanation for the pain.  Nothing seems to trigger the pain.  The pain is not brought on or made worse by flexion of the back or bending over or picking up objects.  There are no alleviating factors.  The pain comes and goes it will.  He denies any constipation.  He denies any diarrhea.  He denies any fevers or chills.  He denies any nausea or vomiting.  He denies any hematuria or dysuria.  He has a history of kidney stones.  He states that this pain feels much different than a kidney stone.  Furthermore the path of radiation is straight through from the back to the bellybutton.  There is no radiation into the groin.  Differential diagnosis includes herniated disc in his lumbar spine, AAA, kidney stone.  I believe this is most likely a musculoskeletal pain in his back.  However I cannot rule out a AAA today.  On exam today, there are no palpable pulsatile masses in his abdomen.  At that time, my plan was: I am concerned based on the patient's history about a possible AAA versus a kidney stone.  If this was a musculoskeletal pain in the back, movement should trigger the pain however movement seems to have no relation to the pain he is experiencing.  If this was a kidney stone, I would expect him to have hematuria or a different path of radiation.  This bores straight through from the center of his back bellybutton.  Therefore I am going to send the patient for an urgent ultrasound to evaluate for any evidence of a AAA.  There is no evidence of an acute abdomen today on exam.  In fact his abdomen is soft  nondistended and nontender.  In fact the patient states he is not in any pain right now.  This pain will come and go randomly it will.  If the ultrasound is completely normal, I believe this is benign cause such as a muscle strain in his back or possibly the early signs of a kidney stone.  We discussed this in detail and the patient would like to proceed with an ultrasound to rule out AAA  11/05/17 Ultrasound was negative for any AAA. Patient states that since I last saw him, he continues to have the pain. However it is much better. In the last 30 days he's only had 2 episodes of the pain. It began in his right flank radiating around in a bandlike pattern now to his bellybutton. There is no hematuria or dysuria. There was no exacerbating or alleviating factors. Last a few seconds to a minute and resolve spontaneously. Therefore essentially the patient has had 1-2 minutes of pain in the last month. Patient agrees with my interpretation of his pain pattern. Therefore I reassured the patient it sounds like his pain is slowly getting better. I believe this is likely muscular or neuropathic in nature potentially coming from his lower back or maybe an abdominal muscle. However the frequency is improving, the intensity is improving, and  in the last 30 days he's only had 2 episodes for less than a minute each. Therefore I recommended against further diagnostic testing at this point. Patient is also concerned about his right bicep. He is seen orthopedics and has been diagnosed with a proximal head tear. Orthopedic recommended against surgery to correct. They recommended gradual resumption of activity in in follow-up of symptoms worsened or did not improve. Patient has a "Popeye" deformity in his right bicep. He denies any pain however. He is avoiding any activity which can make it worse. This is essentially his concern. 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 He is questioning today whether  he needs an MRI to see if it's healing. He is also questioning if he needs an MRI to determine if he needs surgery. Past Medical History:  Diagnosis Date  . Allergy    grass etc.  . Barrett's esophagus   . BPH (benign prostatic hyperplasia)   . Chest pain, atypical   . Chronic kidney disease    kidney stones  . Coronary artery disease   . GERD (gastroesophageal reflux disease)    Barrett's  . Hernia   . Hyperlipidemia   . Hypertension   . Myocardial infarction ([place])   . Ulcer 1960   Past Surgical History:  Procedure Laterality Date  . CARDIAC CATHETERIZATION  02/14/2007   MITRAL REGURGITATION. LV NORMAL  . CHOLECYSTECTOMY    . COLONOSCOPY    . CORONARY ARTERY BYPASS GRAFT     twice  2 by passes each time  . INGUINAL HERNIA REPAIR     bilateral done twice  . LITHOTRIPSY    . UPPER GASTROINTESTINAL ENDOSCOPY     Current Outpatient Medications on File Prior to Visit  Medication Sig Dispense Refill  . aspirin 81 MG tablet Take 81 mg by mouth daily.      . Cholecalciferol (VITAMIN D) 2000 units CAPS Take 1 capsule by mouth daily.    . Dutasteride-Tamsulosin HCl (JALYN) 0.5-0.4 MG CAPS Take by mouth daily.      . lansoprazole (PREVACID) 30 MG capsule Take 30 mg by mouth daily.     [name] losartan (COZAAR) 50 MG tablet Take 1 tablet (50 mg total) daily by mouth. 90 tablet 3  . metoprolol succinate (TOPROL-XL) 25 MG 24 hr tablet take 1 tablet by mouth once daily 90 tablet 3  . Multiple Vitamin (MULTIVITAMIN) tablet Take 1 tablet by mouth daily.      . nitroGLYCERIN (NITROSTAT) 0.4 MG SL tablet Place 1 tablet (0.4 mg total) under the tongue every 5 (five) minutes as needed. 25 tablet 6  . Omega-3 Fatty Acids (FISH OIL) 1000 MG CPDR Take 1,000 mg by mouth daily.      . simvastatin (ZOCOR) 10 MG tablet TAKE 1 TABLET AT BEDTIME 90 tablet 3   No current facility-administered medications on file prior to visit.    Allergies  Allergen Reactions  . Imdur [Isosorbide Mononitrate]      Unknown, per pt   . Meloxicam     Facial swelling    Social History   Socioeconomic History  . Marital status: Married    Spouse name: Not on file  . Number of children: Not on file  . Years of education: Not on file  . Highest education level: Not on file  Social Needs  . Financial resource strain: Not on file  . Food insecurity - worry: Not on file  .  Food insecurity - inability: Not on file  . Transportation needs - medical: Not on file  . Transportation needs - non-medical: Not on file  Occupational History  . Not on file  Tobacco Use  . Smoking status: Never Smoker  . Smokeless tobacco: Never Used  Substance and Sexual Activity  . Alcohol use: No  . Drug use: No  . Sexual activity: Not on file  Other Topics Concern  . Not on file  Social History Narrative  . Not on file      Review of Systems  All other systems reviewed and are negative.      Objective:   Physical Exam  Cardiovascular: Normal rate, regular rhythm and normal heart sounds.  No murmur heard. Pulmonary/Chest: Effort normal and breath sounds normal. No respiratory distress. He has no wheezes. He has no rales.  Abdominal: Soft. Bowel sounds are normal. He exhibits no distension. There is no tenderness. There is no rebound and no guarding.  Musculoskeletal:       Lumbar back: He exhibits normal range of motion, no tenderness, no bony tenderness, no pain and no spasm.  Vitals reviewed.  "popeye" deformity of right biceps.       Assessment & Plan:  Rupture of right biceps tendon, initial encounter  Abdominal pain, unspecified abdominal location  I do not see how the patient benefits from an MRI of his right arm at this point. As I explained to the patient, he clearly has a biceps tendon rupture. The question is whether he needs surgery. Orthopedics has recommended against surgery. Patient does not even know his limitation jet as he has been protecting his right arm and not using it for the  last 6 weeks. I have recommended gradual resumption of activity slowly. 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 As long as he is able to accomplish the majority of his task without significant pain, I cannot see a justification for surgery at this point in his life. However he is unable to perform any of the tasks that he does on a daily basis or he is having severe pain, he may want to consider surgery albeit with a prolonged recovery time. Patient understands and will gradually start slowly increasing his activity level. If his strength or pain is unacceptable, he will follow up again with orthopedics to discuss more aggressive treatment options. Regarding his abdominal pain, I believe it is likely neuropathic or musculoskeletal. However the intensity, frequency, is improving dramatically in the last 30 days. Therefore I recommended that we continue to monitor it as his symptoms seem to be going away.  Spent more than 25 minutes with the patient explaining and discussing his problems

## 2017-12-31 ENCOUNTER — Encounter: Payer: Self-pay | Admitting: Family Medicine

## 2017-12-31 ENCOUNTER — Ambulatory Visit (INDEPENDENT_AMBULATORY_CARE_PROVIDER_SITE_OTHER): Payer: Medicare HMO | Admitting: Family Medicine

## 2017-12-31 VITALS — BP 130/64 | HR 78 | Temp 97.9°F | Resp 12 | Ht 69.0 in | Wt 177.0 lb

## 2017-12-31 DIAGNOSIS — W57XXXS Bitten or stung by nonvenomous insect and other nonvenomous arthropods, sequela: Secondary | ICD-10-CM | POA: Diagnosis not present

## 2017-12-31 DIAGNOSIS — S46211A Strain of muscle, fascia and tendon of other parts of biceps, right arm, initial encounter: Secondary | ICD-10-CM | POA: Diagnosis not present

## 2017-12-31 NOTE — Progress Notes (Signed)
Subjective:    Patient ID: Andrew [name], male    DOB: [birth year], 77 y.o.   MRN: [id number]  HPI  09/23/17 Patient presents today with a sudden onset of low back pain radiating into his abdomen around the level of the umbilicus.  He describes the pain as a sharp intense pain that starts in the center of his back and bore straight through his abdomen to his bellybutton.  He denies any injury or fall or explanation for the pain.  Nothing seems to trigger the pain.  The pain is not brought on or made worse by flexion of the back or bending over or picking up objects.  There are no alleviating factors.  The pain comes and goes it will.  He denies any constipation.  He denies any diarrhea.  He denies any fevers or chills.  He denies any nausea or vomiting.  He denies any hematuria or dysuria.  He has a history of kidney stones.  He states that this pain feels much different than a kidney stone.  Furthermore the path of radiation is straight through from the back to the bellybutton.  There is no radiation into the groin.  Differential diagnosis includes herniated disc in his lumbar spine, AAA, kidney stone.  I believe this is most likely a musculoskeletal pain in his back.  However I cannot rule out a AAA today.  On exam today, there are no palpable pulsatile masses in his abdomen.  At that time, my plan was: I am concerned based on the patient's history about a possible AAA versus a kidney stone.  If this was a musculoskeletal pain in the back, movement should trigger the pain however movement seems to have no relation to the pain he is experiencing.  If this was a kidney stone, I would expect him to have hematuria or a different path of radiation.  This bores straight through from the center of his back bellybutton.  Therefore I am going to send the patient for an urgent ultrasound to evaluate for any evidence of a AAA.  There is no evidence of an acute abdomen today on exam.  In fact his abdomen is soft  nondistended and nontender.  In fact the patient states he is not in any pain right now.  This pain will come and go randomly it will.  If the ultrasound is completely normal, I believe this is benign cause such as a muscle strain in his back or possibly the early signs of a kidney stone.  We discussed this in detail and the patient would like to proceed with an ultrasound to rule out AAA  11/05/17 Ultrasound was negative for any AAA. Patient states that since I last saw him, he continues to have the pain. However it is much better. In the last 30 days he's only had 2 episodes of the pain. It began in his right flank radiating around in a bandlike pattern now to his bellybutton. There is no hematuria or dysuria. There was no exacerbating or alleviating factors. Last a few seconds to a minute and resolve spontaneously. Therefore essentially the patient has had 1-2 minutes of pain in the last month. Patient agrees with my interpretation of his pain pattern. Therefore I reassured the patient it sounds like his pain is slowly getting better. I believe this is likely muscular or neuropathic in nature potentially coming from his lower back or maybe an abdominal muscle. However the frequency is improving, the intensity is improving, and  in the last 30 days he's only had 2 episodes for less than a minute each. Therefore I recommended against further diagnostic testing at this point. Patient is also concerned about his right bicep. He is seen orthopedics and has been diagnosed with a proximal head tear. Orthopedic recommended against surgery to correct. They recommended gradual resumption of activity in in follow-up of symptoms worsened or did not improve. Patient has a "Popeye" deformity in his right bicep. He denies any pain however. He is avoiding any activity which can make it worse. This is essentially his concern. 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 He is questioning today whether  he needs an MRI to see if it's healing. He is also questioning if he needs an MRI to determine if he needs surgery.  At that time, my plan was: I do not see how the patient benefits from an MRI of his right arm at this point. As I explained to the patient, he clearly has a biceps tendon rupture. The question is whether he needs surgery. Orthopedics has recommended against surgery. Patient does not even know his limitation yet as he has been protecting his right arm and not using it for the last 6 weeks. I have recommended gradual resumption of activity slowly. 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 As long as he is able to accomplish the majority of his task without significant pain, I cannot see a justification for surgery at this point in his life. However he is unable to perform any of the tasks that he does on a daily basis or he is having severe pain, he may want to consider surgery albeit with a prolonged recovery time. Patient understands and will gradually start slowly increasing his activity level. If his strength or pain is unacceptable, he will follow up again with orthopedics to discuss more aggressive treatment options. Regarding his abdominal pain, I believe it is likely neuropathic or musculoskeletal. However the intensity, frequency, is improving dramatically in the last 30 days. Therefore I recommended that we continue to monitor it as his symptoms seem to be going away.  Spent more than 25 minutes with the patient explaining and discussing his problems  12/31/17 Patient is again questioning whether he should have his right biceps repaired.  I asked him if he is experiencing any pain or limitations currently.  He states that he has not tried to work at all due to fear he will make it worse.  He is not sure what if any limitations he will have.   Past Medical History:  Diagnosis Date  . Allergy    grass etc.  . Barrett's esophagus   . BPH (benign prostatic hyperplasia)     . Chest pain, atypical   . Chronic kidney disease    kidney stones  . Coronary artery disease   . GERD (gastroesophageal reflux disease)    Barrett's  . Hernia   . Hyperlipidemia   . Hypertension   . Myocardial infarction ([place])   . Ulcer 1960   Past Surgical History:  Procedure Laterality Date  . CARDIAC CATHETERIZATION  02/14/2007   MITRAL REGURGITATION. LV NORMAL  . CHOLECYSTECTOMY    . COLONOSCOPY    . CORONARY ARTERY BYPASS GRAFT     twice  2 by passes each time  . INGUINAL HERNIA REPAIR     bilateral done twice  . LITHOTRIPSY    . UPPER GASTROINTESTINAL  ENDOSCOPY     Current Outpatient Medications on File Prior to Visit  Medication Sig Dispense Refill  . aspirin 81 MG tablet Take 81 mg by mouth daily.      . Cholecalciferol (VITAMIN D) 2000 units CAPS Take 1 capsule by mouth daily.    . Dutasteride-Tamsulosin HCl (JALYN) 0.5-0.4 MG CAPS Take by mouth daily.      . lansoprazole (PREVACID) 30 MG capsule Take 30 mg by mouth daily.     [name] losartan (COZAAR) 50 MG tablet Take 1 tablet (50 mg total) daily by mouth. 90 tablet 3  . metoprolol succinate (TOPROL-XL) 25 MG 24 hr tablet take 1 tablet by mouth once daily 90 tablet 3  . Multiple Vitamin (MULTIVITAMIN) tablet Take 1 tablet by mouth daily.      . nitroGLYCERIN (NITROSTAT) 0.4 MG SL tablet Place 1 tablet (0.4 mg total) under the tongue every 5 (five) minutes as needed. 25 tablet 6  . Omega-3 Fatty Acids (FISH OIL) 1000 MG CPDR Take 1,000 mg by mouth daily.      . simvastatin (ZOCOR) 10 MG tablet TAKE 1 TABLET AT BEDTIME 90 tablet 3   No current facility-administered medications on file prior to visit.    Allergies  Allergen Reactions  . Imdur [Isosorbide Mononitrate]     Unknown, per pt   . Meloxicam     Facial swelling    Social History   Socioeconomic History  . Marital status: Married    Spouse name: Not on file  . Number of children: Not on file  . Years of education: Not on file  . Highest education  level: Not on file  Occupational History  . Not on file  Social Needs  . Financial resource strain: Not on file  . Food insecurity:    Worry: Not on file    Inability: Not on file  . Transportation needs:    Medical: Not on file    Non-medical: Not on file  Tobacco Use  . Smoking status: Never Smoker  . Smokeless tobacco: Never Used  Substance and Sexual Activity  . Alcohol use: No  . Drug use: No  . Sexual activity: Not on file  Lifestyle  . Physical activity:    Days per week: Not on file    Minutes per session: Not on file  . Stress: Not on file  Relationships  . Social connections:    Talks on phone: Not on file    Gets together: Not on file    Attends religious service: Not on file    Active member of club or organization: Not on file    Attends meetings of clubs or organizations: Not on file    Relationship status: Not on file  . Intimate partner violence:    Fear of current or ex partner: Not on file    Emotionally abused: Not on file    Physically abused: Not on file    Forced sexual activity: Not on file  Other Topics Concern  . Not on file  Social History Narrative  . Not on file      Review of Systems  All other systems reviewed and are negative.      Objective:   Physical Exam  Cardiovascular: Normal rate, regular rhythm and normal heart sounds.  No murmur heard. Pulmonary/Chest: Effort normal and breath sounds normal. No respiratory distress. He has no wheezes. He has no rales.  Abdominal: Soft. Bowel sounds are normal. He exhibits no distension.  There is no tenderness. There is no rebound and no guarding.  Musculoskeletal:       Lumbar back: He exhibits normal range of motion, no tenderness, no bony tenderness, no pain and no spasm.  Vitals reviewed.  "popeye" deformity of right biceps.       Assessment & Plan:  Rupture of right biceps tendon, initial encounter I encouraged the patient ot live his life and try and o whatever activity he  normally would do to see what limitations he experiences.  Then and only then will he know if he needs surgery.  However, I also explained that he needs to decide quickly because there is a limit to the time when repair would even be possible.  However, given his age and lack of pain and apparent lack of limitations, I recommended agianst repair.  Patient will ultimately have to decide.    Had a tick bite on medial upper right thigh last year.  He reports recurrent "hive" that comes and goes though none is present now.  Was treated for lyme disease but would like to be tested again.

## 2018-01-04 LAB — B. BURGDORFI ANTIBODIES BY WB
B burgdorferi IgG Abs (IB): NEGATIVE
B burgdorferi IgM Abs (IB): NEGATIVE
LYME DISEASE 28 KD IGG: NONREACTIVE
LYME DISEASE 39 KD IGM: NONREACTIVE
LYME DISEASE 41 KD IGM: NONREACTIVE
LYME DISEASE 45 KD IGG: NONREACTIVE
LYME DISEASE 93 KD IGG: NONREACTIVE
Lyme Disease 18 kD IgG: NONREACTIVE
Lyme Disease 23 kD IgG: NONREACTIVE
Lyme Disease 23 kD IgM: NONREACTIVE
Lyme Disease 30 kD IgG: NONREACTIVE
Lyme Disease 39 kD IgG: NONREACTIVE
Lyme Disease 41 kD IgG: NONREACTIVE
Lyme Disease 58 kD IgG: NONREACTIVE
Lyme Disease 66 kD IgG: NONREACTIVE

## 2018-01-13 ENCOUNTER — Ambulatory Visit (INDEPENDENT_AMBULATORY_CARE_PROVIDER_SITE_OTHER): Payer: Medicare HMO | Admitting: Family Medicine

## 2018-01-13 ENCOUNTER — Encounter: Payer: Self-pay | Admitting: Family Medicine

## 2018-01-13 VITALS — BP 128/68 | HR 76 | Temp 98.1°F | Resp 14 | Ht 69.0 in | Wt 171.0 lb

## 2018-01-13 DIAGNOSIS — K648 Other hemorrhoids: Secondary | ICD-10-CM

## 2018-01-13 LAB — CBC WITH DIFFERENTIAL/PLATELET
BASOS PCT: 0.6 %
Basophils Absolute: 41 cells/uL (ref 0–200)
Eosinophils Absolute: 143 cells/uL (ref 15–500)
Eosinophils Relative: 2.1 %
HCT: 39.4 % (ref 38.5–50.0)
Hemoglobin: 13.7 g/dL (ref 13.2–17.1)
Lymphs Abs: 1578 cells/uL (ref 850–3900)
MCH: 29.9 pg (ref 27.0–33.0)
MCHC: 34.8 g/dL (ref 32.0–36.0)
MCV: 86 fL (ref 80.0–100.0)
MONOS PCT: 7 %
MPV: 10.6 fL (ref 7.5–12.5)
NEUTROS PCT: 67.1 %
Neutro Abs: 4563 cells/uL (ref 1500–7800)
Platelets: 138 10*3/uL — ABNORMAL LOW (ref 140–400)
RBC: 4.58 10*6/uL (ref 4.20–5.80)
RDW: 12.3 % (ref 11.0–15.0)
TOTAL LYMPHOCYTE: 23.2 %
WBC: 6.8 10*3/uL (ref 3.8–10.8)
WBCMIX: 476 {cells}/uL (ref 200–950)

## 2018-01-13 MED ORDER — HYDROCORTISONE ACETATE 25 MG RE SUPP: 25.0000 mg | Freq: Two times a day (BID) | RECTAL | 0 refills | Status: DC

## 2018-01-13 NOTE — Progress Notes (Signed)
Subjective:    Patient ID: Andrew [name], male    DOB: 13-Sep-1941, 77 y.o.   MRN: [id number]  HPI Patient states that on Monday, he had one episode of bright red blood per rectum.  On Tuesday, he had 2 separate episodes.  On Wednesday and Thursday there has been no additional bleeding.  He reports pain with defecation.  He denies any pain or tearing sensation with bowel movements.  He denies any recent constipation.  He denies any fevers chills or abdominal pain.  On exam today there are no external hemorrhoids.  There is no anal fissure.  There is no external rectal abnormality.  Anoscopic exam was then performed which reveals several internal hemorrhoids.  There was no active bleeding seen and no visible mass on anoscopic exam.  Past Medical History:  Diagnosis Date  . Allergy    grass etc.  . Barrett's esophagus   . BPH (benign prostatic hyperplasia)   . Chest pain, atypical   . Chronic kidney disease    kidney stones  . Coronary artery disease   . GERD (gastroesophageal reflux disease)    Barrett's  . Hernia   . Hyperlipidemia   . Hypertension   . Myocardial infarction ([place])   . Ulcer 1960   Past Surgical History:  Procedure Laterality Date  . CARDIAC CATHETERIZATION  02/14/2007   MITRAL REGURGITATION. LV NORMAL  . CHOLECYSTECTOMY    . COLONOSCOPY    . CORONARY ARTERY BYPASS GRAFT     twice  2 by passes each time  . INGUINAL HERNIA REPAIR     bilateral done twice  . LITHOTRIPSY    . UPPER GASTROINTESTINAL ENDOSCOPY     Current Outpatient Medications on File Prior to Visit  Medication Sig Dispense Refill  . aspirin 81 MG tablet Take 81 mg by mouth daily.      . Cholecalciferol (VITAMIN D) 2000 units CAPS Take 1 capsule by mouth daily.    . Dutasteride-Tamsulosin HCl (JALYN) 0.5-0.4 MG CAPS Take by mouth daily.      . lansoprazole (PREVACID) 30 MG capsule Take 30 mg by mouth daily.     [name] losartan (COZAAR) 50 MG tablet Take 1 tablet (50 mg total) daily by mouth. 90 tablet  3  . metoprolol succinate (TOPROL-XL) 25 MG 24 hr tablet take 1 tablet by mouth once daily 90 tablet 3  . Multiple Vitamin (MULTIVITAMIN) tablet Take 1 tablet by mouth daily.      . nitroGLYCERIN (NITROSTAT) 0.4 MG SL tablet Place 1 tablet (0.4 mg total) under the tongue every 5 (five) minutes as needed. 25 tablet 6  . Omega-3 Fatty Acids (FISH OIL) 1000 MG CPDR Take 1,000 mg by mouth daily.      . simvastatin (ZOCOR) 10 MG tablet TAKE 1 TABLET AT BEDTIME 90 tablet 3   No current facility-administered medications on file prior to visit.    Allergies  Allergen Reactions  . Imdur [Isosorbide Mononitrate]     Unknown, per pt   . Meloxicam     Facial swelling    Social History   Socioeconomic History  . Marital status: Married    Spouse name: Not on file  . Number of children: Not on file  . Years of education: Not on file  . Highest education level: Not on file  Occupational History  . Not on file  Social Needs  . Financial resource strain: Not on file  . Food insecurity:    Worry: Not  on file    Inability: Not on file  . Transportation needs:    Medical: Not on file    Non-medical: Not on file  Tobacco Use  . Smoking status: Never Smoker  . Smokeless tobacco: Never Used  Substance and Sexual Activity  . Alcohol use: No  . Drug use: No  . Sexual activity: Not on file  Lifestyle  . Physical activity:    Days per week: Not on file    Minutes per session: Not on file  . Stress: Not on file  Relationships  . Social connections:    Talks on phone: Not on file    Gets together: Not on file    Attends religious service: Not on file    Active member of club or organization: Not on file    Attends meetings of clubs or organizations: Not on file    Relationship status: Not on file  . Intimate partner violence:    Fear of current or ex partner: Not on file    Emotionally abused: Not on file    Physically abused: Not on file    Forced sexual activity: Not on file  Other  Topics Concern  . Not on file  Social History Narrative  . Not on file      Review of Systems  All other systems reviewed and are negative.      Objective:   Physical Exam  Cardiovascular: Normal rate, regular rhythm and normal heart sounds.  Pulmonary/Chest: Effort normal and breath sounds normal. No respiratory distress. He has no wheezes. He has no rales.  Abdominal: Soft. Bowel sounds are normal. He exhibits no distension. There is no tenderness. There is no rebound.  Genitourinary: Rectal exam shows internal hemorrhoid. Rectal exam shows no external hemorrhoid, no fissure, no mass and anal tone normal.  Vitals reviewed.         Assessment & Plan:  Internal hemorrhoids - Plan: hydrocortisone (ANUSOL-HC) 25 MG suppository, CBC with Differential/Platelet  I suspect the source of his bleeding may be the internal hemorrhoids.  I have recommended using Anusol suppositories twice a day for 1 week and MiraLAX to avoid constipation.  If bleeding persists, I will consult GI for endoscopic to rule out other potential sources.  I would also check a CBC to evaluate for significant blood loss however he is hemodynamically stable and in no apparent distress.

## 2018-02-04 ENCOUNTER — Encounter: Payer: Self-pay | Admitting: Family Medicine

## 2018-02-04 ENCOUNTER — Ambulatory Visit (INDEPENDENT_AMBULATORY_CARE_PROVIDER_SITE_OTHER): Payer: Medicare HMO | Admitting: Family Medicine

## 2018-02-04 VITALS — BP 100/64 | HR 72 | Temp 98.0°F | Resp 14 | Ht 69.0 in | Wt 167.0 lb

## 2018-02-04 DIAGNOSIS — L989 Disorder of the skin and subcutaneous tissue, unspecified: Secondary | ICD-10-CM

## 2018-02-04 MED ORDER — AZELASTINE HCL 0.1 % NA SOLN
2.0000 | Freq: Two times a day (BID) | NASAL | 12 refills | Status: DC
Start: 2018-02-04 — End: 2018-08-03

## 2018-02-04 NOTE — Progress Notes (Signed)
Subjective:    Patient ID: Andrew [name], male    DOB: 07-21-1941, 77 y.o.   MRN: [id number]  HPI Patient presents with a black spot on his left arm.  He is concerned that it could be a tick.  There is an erythematous papule on the dorsum of the left forearm with a 1 mm black spot embedded deep within the red papule.  This was discovered yesterday.  There was no visible insect seen at the site.  Using a 21-gauge needle, I removed the skin overlying the black spot.  The black spot actually turned out to be a small clump of dried blood.  Once I removed that there was no residual remaining black tissue underneath the skin.  This reassured the patient.  He then had several unrelated questions.  First he asked if there was anything in addition he can use for his allergies.  He is already taking Flonase, and Zyrtec.  He states that he is unable to breathe through his nostrils at night.  He reports rhinorrhea and head congestion.  Second he questions about whether or not he can start exercising regarding his right biceps tear.   Past Medical History:  Diagnosis Date  . Allergy    grass etc.  . Barrett's esophagus   . BPH (benign prostatic hyperplasia)   . Chest pain, atypical   . Chronic kidney disease    kidney stones  . Coronary artery disease   . GERD (gastroesophageal reflux disease)    Barrett's  . Hernia   . Hyperlipidemia   . Hypertension   . Myocardial infarction ([place])   . Ulcer 1960   Past Surgical History:  Procedure Laterality Date  . CARDIAC CATHETERIZATION  02/14/2007   MITRAL REGURGITATION. LV NORMAL  . CHOLECYSTECTOMY    . COLONOSCOPY    . CORONARY ARTERY BYPASS GRAFT     twice  2 by passes each time  . INGUINAL HERNIA REPAIR     bilateral done twice  . LITHOTRIPSY    . UPPER GASTROINTESTINAL ENDOSCOPY     Current Outpatient Medications on File Prior to Visit  Medication Sig Dispense Refill  . aspirin 81 MG tablet Take 81 mg by mouth daily.      . Cholecalciferol  (VITAMIN D) 2000 units CAPS Take 1 capsule by mouth daily.    . Dutasteride-Tamsulosin HCl (JALYN) 0.5-0.4 MG CAPS Take by mouth daily.      . fluticasone (FLONASE) 50 MCG/ACT nasal spray Place 2 sprays into both nostrils daily.    . hydrocortisone (ANUSOL-HC) 25 MG suppository Place 1 suppository (25 mg total) rectally 2 (two) times daily. 12 suppository 0  . lansoprazole (PREVACID) 30 MG capsule Take 30 mg by mouth daily.     [name] losartan (COZAAR) 50 MG tablet Take 1 tablet (50 mg total) daily by mouth. 90 tablet 3  . metoprolol succinate (TOPROL-XL) 25 MG 24 hr tablet take 1 tablet by mouth once daily 90 tablet 3  . Multiple Vitamin (MULTIVITAMIN) tablet Take 1 tablet by mouth daily.      . nitroGLYCERIN (NITROSTAT) 0.4 MG SL tablet Place 1 tablet (0.4 mg total) under the tongue every 5 (five) minutes as needed. 25 tablet 6  . Omega-3 Fatty Acids (FISH OIL) 1000 MG CPDR Take 1,000 mg by mouth daily.      . simvastatin (ZOCOR) 10 MG tablet TAKE 1 TABLET AT BEDTIME 90 tablet 3   No current facility-administered medications on file prior to visit.  Allergies  Allergen Reactions  . Imdur [Isosorbide Mononitrate]     Unknown, per pt   . Meloxicam     Facial swelling    Social History   Socioeconomic History  . Marital status: Married    Spouse name: Not on file  . Number of children: Not on file  . Years of education: Not on file  . Highest education level: Not on file  Occupational History  . Not on file  Social Needs  . Financial resource strain: Not on file  . Food insecurity:    Worry: Not on file    Inability: Not on file  . Transportation needs:    Medical: Not on file    Non-medical: Not on file  Tobacco Use  . Smoking status: Never Smoker  . Smokeless tobacco: Never Used  Substance and Sexual Activity  . Alcohol use: No  . Drug use: No  . Sexual activity: Not on file  Lifestyle  . Physical activity:    Days per week: Not on file    Minutes per session: Not on  file  . Stress: Not on file  Relationships  . Social connections:    Talks on phone: Not on file    Gets together: Not on file    Attends religious service: Not on file    Active member of club or organization: Not on file    Attends meetings of clubs or organizations: Not on file    Relationship status: Not on file  . Intimate partner violence:    Fear of current or ex partner: Not on file    Emotionally abused: Not on file    Physically abused: Not on file    Forced sexual activity: Not on file  Other Topics Concern  . Not on file  Social History Narrative  . Not on file      Review of Systems  All other systems reviewed and are negative.      Objective:   Physical Exam  Constitutional: He appears well-developed and well-nourished.  Cardiovascular: Normal rate, regular rhythm and normal heart sounds.  Pulmonary/Chest: Effort normal and breath sounds normal.  Skin: Skin is warm. No rash noted. There is erythema.  Vitals reviewed.         Assessment & Plan:  Skin lesion of left arm  There is no tick on the left forearm.  Black speck under the skin inside the red papule was dried clotted blood.  This was removed using a needle.  I am not sure of the cause of the small erythematous papule that is 3 to 4 mm in diameter.  Could be an insect bite or localized point of trauma.  This is something that I will simply watch moving forward.  I recommended that he try Astelin 2 sprays each nostril twice daily for seasonal allergies in addition to his Claritin and Flonase.  Also told the patient that he is cleared to resume activities with his right arm.  As I have explained to the patient previously, pain should be his guide.  He can do any exercise that does not cause him pain and that he is physically able to accomplish.

## 2018-02-25 ENCOUNTER — Other Ambulatory Visit: Payer: Medicare HMO

## 2018-02-25 DIAGNOSIS — R7303 Prediabetes: Secondary | ICD-10-CM | POA: Diagnosis not present

## 2018-02-25 DIAGNOSIS — I10 Essential (primary) hypertension: Secondary | ICD-10-CM | POA: Diagnosis not present

## 2018-02-25 DIAGNOSIS — E7800 Pure hypercholesterolemia, unspecified: Secondary | ICD-10-CM

## 2018-02-25 DIAGNOSIS — Z125 Encounter for screening for malignant neoplasm of prostate: Secondary | ICD-10-CM

## 2018-02-26 LAB — COMPLETE METABOLIC PANEL WITH GFR
AG RATIO: 2.2 (calc) (ref 1.0–2.5)
ALT: 14 U/L (ref 9–46)
AST: 19 U/L (ref 10–35)
Albumin: 4.1 g/dL (ref 3.6–5.1)
Alkaline phosphatase (APISO): 62 U/L (ref 40–115)
BUN: 18 mg/dL (ref 7–25)
CHLORIDE: 106 mmol/L (ref 98–110)
CO2: 26 mmol/L (ref 20–32)
Calcium: 9 mg/dL (ref 8.6–10.3)
Creat: 0.83 mg/dL (ref 0.70–1.18)
GFR, EST AFRICAN AMERICAN: 97 mL/min/{1.73_m2} (ref >=60)
GFR, EST NON AFRICAN AMERICAN: 84 mL/min/{1.73_m2} (ref >=60)
GLOBULIN: 1.9 g/dL (ref 1.9–3.7)
Glucose, Bld: 143 mg/dL — ABNORMAL HIGH (ref 65–99)
POTASSIUM: 4.9 mmol/L (ref 3.5–5.3)
SODIUM: 140 mmol/L (ref 135–146)
TOTAL PROTEIN: 6 g/dL — AB (ref 6.1–8.1)
Total Bilirubin: 0.6 mg/dL (ref 0.2–1.2)

## 2018-02-26 LAB — LIPID PANEL
Cholesterol: 100 mg/dL (ref <200)
HDL: 36 mg/dL — ABNORMAL LOW (ref >40)
LDL Cholesterol (Calc): 50 mg/dL (calc)
NON-HDL CHOLESTEROL (CALC): 64 mg/dL (ref <130)
Total CHOL/HDL Ratio: 2.8 (calc) (ref <5.0)
Triglycerides: 62 mg/dL (ref <150)

## 2018-02-26 LAB — CBC WITH DIFFERENTIAL/PLATELET
BASOS ABS: 43 {cells}/uL (ref 0–200)
Basophils Relative: 0.6 %
EOS ABS: 128 {cells}/uL (ref 15–500)
Eosinophils Relative: 1.8 %
HEMATOCRIT: 40.2 % (ref 38.5–50.0)
Hemoglobin: 14 g/dL (ref 13.2–17.1)
Lymphs Abs: 1598 cells/uL (ref 850–3900)
MCH: 30.3 pg (ref 27.0–33.0)
MCHC: 34.8 g/dL (ref 32.0–36.0)
MCV: 87 fL (ref 80.0–100.0)
MONOS PCT: 7.9 %
MPV: 10.9 fL (ref 7.5–12.5)
NEUTROS ABS: 4771 {cells}/uL (ref 1500–7800)
Neutrophils Relative %: 67.2 %
PLATELETS: 120 10*3/uL — AB (ref 140–400)
RBC: 4.62 10*6/uL (ref 4.20–5.80)
RDW: 12.5 % (ref 11.0–15.0)
Total Lymphocyte: 22.5 %
WBC mixed population: 561 cells/uL (ref 200–950)
WBC: 7.1 10*3/uL (ref 3.8–10.8)

## 2018-02-26 LAB — HEMOGLOBIN A1C
EAG (MMOL/L): 7.6 (calc)
Hgb A1c MFr Bld: 6.4 % of total Hgb — ABNORMAL HIGH (ref <5.7)
MEAN PLASMA GLUCOSE: 137 (calc)

## 2018-02-26 LAB — PSA: PSA: 0.3 ng/mL (ref <=4.0)

## 2018-02-28 ENCOUNTER — Encounter: Payer: Self-pay | Admitting: *Deleted

## 2018-03-09 DIAGNOSIS — H5200 Hypermetropia, unspecified eye: Secondary | ICD-10-CM | POA: Diagnosis not present

## 2018-04-13 ENCOUNTER — Other Ambulatory Visit: Payer: Self-pay | Admitting: Family Medicine

## 2018-04-18 ENCOUNTER — Other Ambulatory Visit: Payer: Self-pay | Admitting: Family Medicine

## 2018-04-18 MED ORDER — METOPROLOL SUCCINATE ER 25 MG PO TB24: 25.0000 mg | ORAL_TABLET | Freq: Every day | ORAL | 3 refills | Status: DC

## 2018-04-29 ENCOUNTER — Encounter: Payer: Self-pay | Admitting: Family Medicine

## 2018-04-29 ENCOUNTER — Other Ambulatory Visit: Payer: Self-pay

## 2018-04-29 ENCOUNTER — Ambulatory Visit (INDEPENDENT_AMBULATORY_CARE_PROVIDER_SITE_OTHER): Payer: Medicare HMO | Admitting: Family Medicine

## 2018-04-29 VITALS — BP 118/68 | HR 84 | Temp 97.6°F | Resp 16 | Ht 69.0 in | Wt 167.0 lb

## 2018-04-29 DIAGNOSIS — T63464A Toxic effect of venom of wasps, undetermined, initial encounter: Secondary | ICD-10-CM

## 2018-04-29 NOTE — Progress Notes (Signed)
Subjective:    Patient ID: Andrew [name], male    DOB: 1941/02/16, 77 y.o.   MRN: [id number]  HPI Patient was stung on the dorsum of his left hand by a yellowjacket yesterday while using a tractor.  The dorsum and palm of his left hand is swollen today.  It is slightly warm to the touch.  There is no erythema.  There is very little itching.  Swelling does not extend past the MCP joints or past the wrist.  There is no evidence of a secondary cellulitis.  There is no pain.  He denies any trouble breathing, facial swelling, or angioedema Past Medical History:  Diagnosis Date  . Allergy    grass etc.  . Barrett's esophagus   . BPH (benign prostatic hyperplasia)   . Chest pain, atypical   . Chronic kidney disease    kidney stones  . Coronary artery disease   . GERD (gastroesophageal reflux disease)    Barrett's  . Hernia   . Hyperlipidemia   . Hypertension   . Myocardial infarction ([place])   . Ulcer 1960   Past Surgical History:  Procedure Laterality Date  . CARDIAC CATHETERIZATION  02/14/2007   MITRAL REGURGITATION. LV NORMAL  . CHOLECYSTECTOMY    . COLONOSCOPY    . CORONARY ARTERY BYPASS GRAFT     twice  2 by passes each time  . INGUINAL HERNIA REPAIR     bilateral done twice  . LITHOTRIPSY    . UPPER GASTROINTESTINAL ENDOSCOPY     Current Outpatient Medications on File Prior to Visit  Medication Sig Dispense Refill  . aspirin 81 MG tablet Take 81 mg by mouth daily.      [name] azelastine (ASTELIN) 0.1 % nasal spray Place 2 sprays into both nostrils 2 (two) times daily. Use in each nostril as directed 30 mL 12  . Cholecalciferol (VITAMIN D) 2000 units CAPS Take 1 capsule by mouth daily.    . Dutasteride-Tamsulosin HCl (JALYN) 0.5-0.4 MG CAPS Take by mouth daily.      . fluticasone (FLONASE) 50 MCG/ACT nasal spray Place 2 sprays into both nostrils daily.    . lansoprazole (PREVACID) 30 MG capsule Take 30 mg by mouth daily.     . lansoprazole (PREVACID) 30 MG capsule TAKE 1 CAPSULE  DAILY 90 capsule 3  . losartan (COZAAR) 50 MG tablet Take 1 tablet (50 mg total) daily by mouth. 90 tablet 3  . metoprolol succinate (TOPROL-XL) 25 MG 24 hr tablet Take 1 tablet (25 mg total) by mouth daily. 90 tablet 3  . Multiple Vitamin (MULTIVITAMIN) tablet Take 1 tablet by mouth daily.      . nitroGLYCERIN (NITROSTAT) 0.4 MG SL tablet Place 1 tablet (0.4 mg total) under the tongue every 5 (five) minutes as needed. 25 tablet 6  . Omega-3 Fatty Acids (FISH OIL) 1000 MG CPDR Take 1,000 mg by mouth daily.      . simvastatin (ZOCOR) 10 MG tablet TAKE 1 TABLET AT BEDTIME 90 tablet 3   No current facility-administered medications on file prior to visit.    Allergies  Allergen Reactions  . Imdur [Isosorbide Mononitrate]     Unknown, per pt   . Meloxicam     Facial swelling    Social History   Socioeconomic History  . Marital status: Married    Spouse name: Not on file  . Number of children: Not on file  . Years of education: Not on file  . Highest education  level: Not on file  Occupational History  . Not on file  Social Needs  . Financial resource strain: Not on file  . Food insecurity:    Worry: Not on file    Inability: Not on file  . Transportation needs:    Medical: Not on file    Non-medical: Not on file  Tobacco Use  . Smoking status: Never Smoker  . Smokeless tobacco: Never Used  Substance and Sexual Activity  . Alcohol use: No  . Drug use: No  . Sexual activity: Not on file  Lifestyle  . Physical activity:    Days per week: Not on file    Minutes per session: Not on file  . Stress: Not on file  Relationships  . Social connections:    Talks on phone: Not on file    Gets together: Not on file    Attends religious service: Not on file    Active member of club or organization: Not on file    Attends meetings of clubs or organizations: Not on file    Relationship status: Not on file  . Intimate partner violence:    Fear of current or ex partner: Not on file      Emotionally abused: Not on file    Physically abused: Not on file    Forced sexual activity: Not on file  Other Topics Concern  . Not on file  Social History Narrative  . Not on file      Review of Systems  All other systems reviewed and are negative.      Objective:   Physical Exam  Constitutional: He appears well-developed and well-nourished.  Cardiovascular: Normal rate, regular rhythm and normal heart sounds.  Pulmonary/Chest: Effort normal and breath sounds normal.  Musculoskeletal:       Left hand: He exhibits swelling. He exhibits normal range of motion and no tenderness. Normal sensation noted. Normal strength noted.       Hands: Vitals reviewed.         Assessment & Plan:  Yellow jacket sting, undetermined intent, initial encounter  Better today than yesterday.  Begin Benadryl 25 mg p.o. every 6 hours as needed swelling or itching.  Apply ice to the affected area 2-3 times a day to improve the swelling.  Seems to be improving as it is already much better than yesterday.  No further treatment is necessary.  Should gradually improve over the next 1 to 2 days

## 2018-05-11 ENCOUNTER — Encounter (INDEPENDENT_AMBULATORY_CARE_PROVIDER_SITE_OTHER): Payer: Self-pay

## 2018-05-11 ENCOUNTER — Ambulatory Visit (INDEPENDENT_AMBULATORY_CARE_PROVIDER_SITE_OTHER): Payer: Medicare HMO | Admitting: Cardiovascular Disease

## 2018-05-11 ENCOUNTER — Encounter: Payer: Self-pay | Admitting: Cardiovascular Disease

## 2018-05-11 VITALS — BP 116/55 | HR 73 | Ht 69.0 in | Wt 165.8 lb

## 2018-05-11 DIAGNOSIS — I25119 Atherosclerotic heart disease of native coronary artery with unspecified angina pectoris: Secondary | ICD-10-CM | POA: Diagnosis not present

## 2018-05-11 DIAGNOSIS — I10 Essential (primary) hypertension: Secondary | ICD-10-CM

## 2018-05-11 MED ORDER — NITROGLYCERIN 0.4 MG SL SUBL: 0.4000 mg | SUBLINGUAL_TABLET | SUBLINGUAL | 4 refills | Status: DC | PRN

## 2018-05-11 NOTE — Patient Instructions (Signed)
Medication Instructions:  Your physician recommends that you continue on your current medications as directed. Please refer to the Current Medication list given to you today.   Labwork: None Ordered   Testing/Procedures: None Ordered   Follow-Up: Your physician wants you to follow-up in: 6 months with Scott Weaver, PA.  You will receive a reminder letter in the mail two months in advance. If you don't receive a letter, please call our office to schedule the follow-up appointment.   If you need a refill on your cardiac medications before your next appointment, please call your pharmacy.   Thank you for choosing [facility]! Kavina Cantave, RN [phone]    

## 2018-05-11 NOTE — Progress Notes (Signed)
Andrew [name] Date of Birth  03-26-1941 [facility] [address]    [place], [place]  [address] [phone]  Fax  [phone]   problem list 1. Coronary artery disease 2. Hyperlipidemia 3.  Previous notes.   Andrew [name] is a 77 year old gentleman with a history of coronary artery disease. He status post coronary artery bypass grafting. He also has a history of dyslipidemia.  He's recently been having some problems with back pain.  These episodes of back pain would typically occur when he is doing his normal household chores. It would typically occur in the middle of his back and radiate up through to the front of his chest and between the shoulder blades.  This was not similar to his previous episodes of angina prior to his bypass grafting.  He's been exercising on a regular basis and has not had these symptoms with exercise.   Sept. 11. 2014:  Andrew [name] is doing ok.  No angina.  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  No   Sept. 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  Raised a garden this years.  No CP. Works out regularly.    Oct. 4, 2016: Doing great.    i reviewed his labs with him.   Everything is stable .  Still raising cattle.  Beef prices are down quite a bit this year.    Oct. 24, 2017:   Doing well Still raising cows.    No CP .    September 29, 2017:  Andrew [name] is seen today.  He has a history of coronary artery disease and hyperlipidemia. No CP  Has some shortness of breath. Has had some lower back pain and abd.  Still raising cows .   Hurt his right arm while using his  tractor   His last heart catheterization was Feb 14, 2007. The left anterior descending artery is occluded.  The saphenous vein graft to the second diagonal artery is a large graft and fills the left anterior descending artery in a retrograde fashion.  There is a 90% stenosis in the mid/distal LAD. The LIMA is atretic and does not supply any significant flow to the  LAD. Left circumflex artery has minor luminal irregularities.  The left circumflex artery is dominant.   May 11, 2018:  Doing well.   Treadmill for about 15 to 20 minutes each day.  No episodes of chest pain.  Has occasional swelling in rignht ankle but this is related to SVG harvest  Recent lipid panel from May, 2019 all look good.  Total cholesterol is 100.  HDL is 36.  The LDL is 50.  The triglyceride level is 62. Does not take NTG   Current Outpatient Medications on File Prior to Visit  Medication Sig Dispense Refill  . aspirin 81 MG tablet Take 81 mg by mouth daily.      [name] azelastine (ASTELIN) 0.1 % nasal spray Place 2 sprays into both nostrils 2 (two) times daily. Use in each nostril as directed 30 mL 12  . Cholecalciferol (VITAMIN D) 2000 units CAPS Take 1 capsule by mouth daily.    . Dutasteride-Tamsulosin HCl (JALYN) 0.5-0.4 MG CAPS Take 1 capsule by mouth daily.     . fluticasone (FLONASE) 50 MCG/ACT nasal spray Place 2 sprays into both nostrils daily.    . lansoprazole (PREVACID) 30 MG capsule Take 30 mg by mouth daily.     [name] losartan (COZAAR) 50 MG tablet Take 1  tablet (50 mg total) daily by mouth. 90 tablet 3  . metoprolol succinate (TOPROL-XL) 25 MG 24 hr tablet Take 1 tablet (25 mg total) by mouth daily. 90 tablet 3  . Multiple Vitamin (MULTIVITAMIN) tablet Take 1 tablet by mouth daily.      . nitroGLYCERIN (NITROSTAT) 0.4 MG SL tablet Place 1 tablet (0.4 mg total) under the tongue every 5 (five) minutes as needed. 25 tablet 6  . Omega-3 Fatty Acids (FISH OIL) 1000 MG CPDR Take 1,000 mg by mouth daily.      . simvastatin (ZOCOR) 10 MG tablet TAKE 1 TABLET AT BEDTIME 90 tablet 3   No current facility-administered medications on file prior to visit.     Allergies  Allergen Reactions  . Imdur [Isosorbide Mononitrate]     Unknown, per pt   . Meloxicam     Facial swelling     Past Medical History:  Diagnosis Date  . Allergy    grass etc.  . Barrett's esophagus    . BPH (benign prostatic hyperplasia)   . Chest pain, atypical   . Chronic kidney disease    kidney stones  . Coronary artery disease   . GERD (gastroesophageal reflux disease)    Barrett's  . Hernia   . Hyperlipidemia   . Hypertension   . Myocardial infarction ([place])   . Ulcer 1960    Past Surgical History:  Procedure Laterality Date  . CARDIAC CATHETERIZATION  02/14/2007   MITRAL REGURGITATION. LV NORMAL  . CHOLECYSTECTOMY    . COLONOSCOPY    . CORONARY ARTERY BYPASS GRAFT     twice  2 by passes each time  . INGUINAL HERNIA REPAIR     bilateral done twice  . LITHOTRIPSY    . UPPER GASTROINTESTINAL ENDOSCOPY      Social History   Tobacco Use  Smoking Status Never Smoker  Smokeless Tobacco Never Used    Social History   Substance and Sexual Activity  Alcohol Use No    Family History  Problem Relation Age of Onset  . Heart disease Father     Reviw of Systems:   Noted in current history.  Physical Exam: Blood pressure (!) 116/55, pulse 73, height 5\' 9"  (1.753 m), weight 165 lb 12.8 oz (75.2 kg), SpO2 95 %.  GEN:  Well nourished, well developed in no acute distress HEENT: Normal NECK: No JVD; No carotid bruits LYMPHATICS: No lymphadenopathy CARDIAC: RRR , no murmurs, rubs, gallops RESPIRATORY:  Clear to auscultation without rales, wheezing or rhonchi  ABDOMEN: Soft, non-tender, non-distended MUSCULOSKELETAL:  No edema; No deformity  SKIN: Warm and dry NEUROLOGIC:  Alert and oriented x 3   ECG:       Assessment / Plan:    1. Coronary artery disease- he had coronary artery bypass grafting and has an occluded left anterior descending artery.  The IMA is atretic.  The mid LAD fills via retrograde filling from the diagonal graft.   Feeling well   2. Hyperlipidemia-    recent labs in May, 2019 look great.  Continue current medications.  3. Carotid artery disease:    Mertie Moores, MD  05/11/2018 10:39 AM    [facility] [address],  [place], [place]  [address] Pager [phone] Phone: [phone]; Fax: [phone]

## 2018-05-24 ENCOUNTER — Encounter: Payer: Self-pay | Admitting: Family Medicine

## 2018-05-24 ENCOUNTER — Ambulatory Visit (INDEPENDENT_AMBULATORY_CARE_PROVIDER_SITE_OTHER): Payer: Medicare HMO | Admitting: Family Medicine

## 2018-05-24 VITALS — BP 130/64 | HR 78 | Temp 97.8°F | Resp 14 | Ht 69.0 in | Wt 167.0 lb

## 2018-05-24 DIAGNOSIS — R197 Diarrhea, unspecified: Secondary | ICD-10-CM

## 2018-05-24 NOTE — Progress Notes (Signed)
Subjective:    Patient ID: Andrew [name], male    DOB: 01/10/1941, 77 y.o.   MRN: [id number]  HPI Patient is here reporting 2 to 3 weeks of watery diarrhea.  Initially it was 2-3 bowel movements a day.  Yesterday was 5 bowel movements.  Most are soft or loose.  Some are watery.  He denies any intestinal cramps.  He denies any intestinal pain.  He denies any nausea or vomiting or fever.  He denies any hospitalizations.  He denies any antibiotic use.  He denies any recent travel.  He has been eating a lot of watermelon, cantaloupe, tomatoes, and grapes.  He is also been drinking a lot of water.  Some of this may be an osmotic diarrhea secondary to these foods.  He thinks it may be due to losartan.  I explained to the patient that losartan has a 4% diarrhea rate so I believe that is unlikely.  He denies any weight loss or chills. Past Medical History:  Diagnosis Date  . Allergy    grass etc.  . Barrett's esophagus   . BPH (benign prostatic hyperplasia)   . Chest pain, atypical   . Chronic kidney disease    kidney stones  . Coronary artery disease   . GERD (gastroesophageal reflux disease)    Barrett's  . Hernia   . Hyperlipidemia   . Hypertension   . Myocardial infarction ([place])   . Ulcer 1960   Past Surgical History:  Procedure Laterality Date  . CARDIAC CATHETERIZATION  02/14/2007   MITRAL REGURGITATION. LV NORMAL  . CHOLECYSTECTOMY    . COLONOSCOPY    . CORONARY ARTERY BYPASS GRAFT     twice  2 by passes each time  . INGUINAL HERNIA REPAIR     bilateral done twice  . LITHOTRIPSY    . UPPER GASTROINTESTINAL ENDOSCOPY     Current Outpatient Medications on File Prior to Visit  Medication Sig Dispense Refill  . aspirin 81 MG tablet Take 81 mg by mouth daily.      [name] azelastine (ASTELIN) 0.1 % nasal spray Place 2 sprays into both nostrils 2 (two) times daily. Use in each nostril as directed 30 mL 12  . Cholecalciferol (VITAMIN D) 2000 units CAPS Take 1 capsule by mouth daily.    .  Dutasteride-Tamsulosin HCl (JALYN) 0.5-0.4 MG CAPS Take 1 capsule by mouth daily.     . fluticasone (FLONASE) 50 MCG/ACT nasal spray Place 2 sprays into both nostrils daily.    . lansoprazole (PREVACID) 30 MG capsule Take 30 mg by mouth daily.     [name] losartan (COZAAR) 50 MG tablet Take 1 tablet (50 mg total) daily by mouth. 90 tablet 3  . metoprolol succinate (TOPROL-XL) 25 MG 24 hr tablet Take 1 tablet (25 mg total) by mouth daily. 90 tablet 3  . Multiple Vitamin (MULTIVITAMIN) tablet Take 1 tablet by mouth daily.      . nitroGLYCERIN (NITROSTAT) 0.4 MG SL tablet Place 1 tablet (0.4 mg total) under the tongue every 5 (five) minutes as needed. 50 tablet 4  . Omega-3 Fatty Acids (FISH OIL) 1000 MG CPDR Take 1,000 mg by mouth daily.      . simvastatin (ZOCOR) 10 MG tablet TAKE 1 TABLET AT BEDTIME 90 tablet 3   No current facility-administered medications on file prior to visit.    Allergies  Allergen Reactions  . Imdur [Isosorbide Mononitrate]     Unknown, per pt   . Meloxicam  Facial swelling    Social History   Socioeconomic History  . Marital status: Married    Spouse name: Not on file  . Number of children: Not on file  . Years of education: Not on file  . Highest education level: Not on file  Occupational History  . Not on file  Social Needs  . Financial resource strain: Not on file  . Food insecurity:    Worry: Not on file    Inability: Not on file  . Transportation needs:    Medical: Not on file    Non-medical: Not on file  Tobacco Use  . Smoking status: Never Smoker  . Smokeless tobacco: Never Used  Substance and Sexual Activity  . Alcohol use: No  . Drug use: No  . Sexual activity: Not on file  Lifestyle  . Physical activity:    Days per week: Not on file    Minutes per session: Not on file  . Stress: Not on file  Relationships  . Social connections:    Talks on phone: Not on file    Gets together: Not on file    Attends religious service: Not on file      Active member of club or organization: Not on file    Attends meetings of clubs or organizations: Not on file    Relationship status: Not on file  . Intimate partner violence:    Fear of current or ex partner: Not on file    Emotionally abused: Not on file    Physically abused: Not on file    Forced sexual activity: Not on file  Other Topics Concern  . Not on file  Social History Narrative  . Not on file      Review of Systems  All other systems reviewed and are negative.      Objective:   Physical Exam  Constitutional: He appears well-developed and well-nourished.  Cardiovascular: Normal rate, regular rhythm and normal heart sounds.  Pulmonary/Chest: Effort normal and breath sounds normal.  Abdominal: Soft. Bowel sounds are normal. He exhibits no distension and no mass. There is no tenderness. There is no rebound and no guarding.  Vitals reviewed.         Assessment & Plan:  Diarrhea, unspecified type  I believe is most likely an osmotic diarrhea secondary to increased food intake.  I have recommended adding a fiber supplement.  Monitor the diarrhea over the next week or so and see if it improves with fiber supplement as a stool bulking agent.  I do not believe the losartan is causing his diarrhea.  If the diarrhea persist, I will check a GI pathogen panel as well as a sedimentation rate.  I also recommended decreasing his consumption of watermelon and grapes to see if that would also help.

## 2018-06-27 DIAGNOSIS — Z87442 Personal history of urinary calculi: Secondary | ICD-10-CM | POA: Diagnosis not present

## 2018-06-27 DIAGNOSIS — R351 Nocturia: Secondary | ICD-10-CM | POA: Diagnosis not present

## 2018-06-27 DIAGNOSIS — N403 Nodular prostate with lower urinary tract symptoms: Secondary | ICD-10-CM | POA: Diagnosis not present

## 2018-07-14 ENCOUNTER — Encounter: Payer: Self-pay | Admitting: Family Medicine

## 2018-07-14 ENCOUNTER — Ambulatory Visit (INDEPENDENT_AMBULATORY_CARE_PROVIDER_SITE_OTHER): Payer: Medicare HMO | Admitting: Family Medicine

## 2018-07-14 VITALS — BP 120/60 | HR 70 | Temp 98.2°F | Resp 16 | Ht 69.0 in | Wt 165.0 lb

## 2018-07-14 DIAGNOSIS — W57XXXA Bitten or stung by nonvenomous insect and other nonvenomous arthropods, initial encounter: Secondary | ICD-10-CM

## 2018-07-14 DIAGNOSIS — R7303 Prediabetes: Secondary | ICD-10-CM

## 2018-07-14 DIAGNOSIS — R197 Diarrhea, unspecified: Secondary | ICD-10-CM

## 2018-07-14 MED ORDER — MOMETASONE FUROATE 0.1 % EX OINT: TOPICAL_OINTMENT | Freq: Every day | CUTANEOUS | 0 refills | Status: DC

## 2018-07-14 NOTE — Progress Notes (Signed)
Subjective:    Patient ID: Andrew [name], male    DOB: 28-Feb-1941, 77 y.o.   MRN: [id number]  HPI  05/24/18 Patient is here reporting 2 to 3 weeks of watery diarrhea.  Initially it was 2-3 bowel movements a day.  Yesterday was 5 bowel movements.  Most are soft or loose.  Some are watery.  He denies any intestinal cramps.  He denies any intestinal pain.  He denies any nausea or vomiting or fever.  He denies any hospitalizations.  He denies any antibiotic use.  He denies any recent travel.  He has been eating a lot of watermelon, cantaloupe, tomatoes, and grapes.  He is also been drinking a lot of water.  Some of this may be an osmotic diarrhea secondary to these foods.  He thinks it may be due to losartan.  I explained to the patient that losartan has a 4% diarrhea rate so I believe that is unlikely.  He denies any weight loss or chills.  At that time, my plan was: I believe is most likely an osmotic diarrhea secondary to increased food intake.  I have recommended adding a fiber supplement.  Monitor the diarrhea over the next week or so and see if it improves with fiber supplement as a stool bulking agent.  I do not believe the losartan is causing his diarrhea.  If the diarrhea persist, I will check a GI pathogen panel as well as a sedimentation rate.  I also recommended decreasing his consumption of watermelon and grapes to see if that would also help.  07/14/18 Diarrhea has returned.  Patient is convinced is coming from the losartan.  He states that the diarrhea never started until he started the losartan.  It will come and go.  Some days he will have any diarrhea.  Other days he will have 2 or 3 loose watery bowel movements.  He denies any melena or hematochezia.  He denies any weight loss or fever or chills.  Change in his diet did not help.  He also has 3 insect bites on his left ankle.  These are 2 to 3 mm erythematous papules.  He has 2 insect bites on his right ankle.  These are also 2 to 3 mm  erythematous feels.  They itch.  They have been there for approximately 2 weeks and he denies any systemic symptoms Past Medical History:  Diagnosis Date  . Allergy    grass etc.  . Barrett's esophagus   . BPH (benign prostatic hyperplasia)   . Chest pain, atypical   . Chronic kidney disease    kidney stones  . Coronary artery disease   . GERD (gastroesophageal reflux disease)    Barrett's  . Hernia   . Hyperlipidemia   . Hypertension   . Myocardial infarction ([place])   . Ulcer 1960   Past Surgical History:  Procedure Laterality Date  . CARDIAC CATHETERIZATION  02/14/2007   MITRAL REGURGITATION. LV NORMAL  . CHOLECYSTECTOMY    . COLONOSCOPY    . CORONARY ARTERY BYPASS GRAFT     twice  2 by passes each time  . INGUINAL HERNIA REPAIR     bilateral done twice  . LITHOTRIPSY    . UPPER GASTROINTESTINAL ENDOSCOPY     Current Outpatient Medications on File Prior to Visit  Medication Sig Dispense Refill  . aspirin 81 MG tablet Take 81 mg by mouth daily.      [name] azelastine (ASTELIN) 0.1 % nasal spray Place  2 sprays into both nostrils 2 (two) times daily. Use in each nostril as directed 30 mL 12  . Cholecalciferol (VITAMIN D) 2000 units CAPS Take 1 capsule by mouth daily.    . Dutasteride-Tamsulosin HCl (JALYN) 0.5-0.4 MG CAPS Take 1 capsule by mouth daily.     . fluticasone (FLONASE) 50 MCG/ACT nasal spray Place 2 sprays into both nostrils daily.    . lansoprazole (PREVACID) 30 MG capsule Take 30 mg by mouth daily.     [name] losartan (COZAAR) 50 MG tablet Take 1 tablet (50 mg total) daily by mouth. 90 tablet 3  . metoprolol succinate (TOPROL-XL) 25 MG 24 hr tablet Take 1 tablet (25 mg total) by mouth daily. 90 tablet 3  . Multiple Vitamin (MULTIVITAMIN) tablet Take 1 tablet by mouth daily.      . nitroGLYCERIN (NITROSTAT) 0.4 MG SL tablet Place 1 tablet (0.4 mg total) under the tongue every 5 (five) minutes as needed. 50 tablet 4  . Omega-3 Fatty Acids (FISH OIL) 1000 MG CPDR Take 1,000 mg  by mouth daily.      . simvastatin (ZOCOR) 10 MG tablet TAKE 1 TABLET AT BEDTIME 90 tablet 3   No current facility-administered medications on file prior to visit.    Allergies  Allergen Reactions  . Imdur [Isosorbide Mononitrate]     Unknown, per pt   . Meloxicam     Facial swelling    Social History   Socioeconomic History  . Marital status: Married    Spouse name: Not on file  . Number of children: Not on file  . Years of education: Not on file  . Highest education level: Not on file  Occupational History  . Not on file  Social Needs  . Financial resource strain: Not on file  . Food insecurity:    Worry: Not on file    Inability: Not on file  . Transportation needs:    Medical: Not on file    Non-medical: Not on file  Tobacco Use  . Smoking status: Never Smoker  . Smokeless tobacco: Never Used  Substance and Sexual Activity  . Alcohol use: No  . Drug use: No  . Sexual activity: Not on file  Lifestyle  . Physical activity:    Days per week: Not on file    Minutes per session: Not on file  . Stress: Not on file  Relationships  . Social connections:    Talks on phone: Not on file    Gets together: Not on file    Attends religious service: Not on file    Active member of club or organization: Not on file    Attends meetings of clubs or organizations: Not on file    Relationship status: Not on file  . Intimate partner violence:    Fear of current or ex partner: Not on file    Emotionally abused: Not on file    Physically abused: Not on file    Forced sexual activity: Not on file  Other Topics Concern  . Not on file  Social History Narrative  . Not on file      Review of Systems  All other systems reviewed and are negative.      Objective:   Physical Exam  Constitutional: He appears well-developed and well-nourished.  Cardiovascular: Normal rate, regular rhythm and normal heart sounds.  Pulmonary/Chest: Effort normal and breath sounds normal.    Abdominal: Soft. Bowel sounds are normal. He exhibits no distension and  no mass. There is no tenderness. There is no rebound and no guarding.  Vitals reviewed.         Assessment & Plan:  Episodic diarrhea, insect bites  Treat the insect bites with Elocon ointment applied once daily for 7 days.  These appear benign.  I do not believe the losartan is causing his diarrhea however I recommended that the patient discontinue the medication for 7 to 10 days.  If the diarrhea subsides, we can switch losartan to a different antihypertensive.  If the diarrhea continues, I would recommend GI consultation to evaluate for possible inflammatory bowel disease.  Also check a sed rate along with his fasting lab work regarding his prediabetes

## 2018-07-15 ENCOUNTER — Other Ambulatory Visit: Payer: Medicare HMO

## 2018-07-15 DIAGNOSIS — R197 Diarrhea, unspecified: Secondary | ICD-10-CM

## 2018-07-15 DIAGNOSIS — R7303 Prediabetes: Secondary | ICD-10-CM | POA: Diagnosis not present

## 2018-07-15 DIAGNOSIS — E785 Hyperlipidemia, unspecified: Secondary | ICD-10-CM

## 2018-07-16 LAB — COMPREHENSIVE METABOLIC PANEL
AG Ratio: 2 (calc) (ref 1.0–2.5)
ALKALINE PHOSPHATASE (APISO): 59 U/L (ref 40–115)
ALT: 11 U/L (ref 9–46)
AST: 15 U/L (ref 10–35)
Albumin: 4 g/dL (ref 3.6–5.1)
BUN: 19 mg/dL (ref 7–25)
CO2: 25 mmol/L (ref 20–32)
CREATININE: 0.92 mg/dL (ref 0.70–1.18)
Calcium: 9 mg/dL (ref 8.6–10.3)
Chloride: 104 mmol/L (ref 98–110)
Globulin: 2 g/dL (calc) (ref 1.9–3.7)
Glucose, Bld: 157 mg/dL — ABNORMAL HIGH (ref 65–99)
Potassium: 4.6 mmol/L (ref 3.5–5.3)
Sodium: 139 mmol/L (ref 135–146)
Total Bilirubin: 0.6 mg/dL (ref 0.2–1.2)
Total Protein: 6 g/dL — ABNORMAL LOW (ref 6.1–8.1)

## 2018-07-16 LAB — LIPID PANEL
CHOL/HDL RATIO: 2.9 (calc) (ref <5.0)
Cholesterol: 99 mg/dL (ref <200)
HDL: 34 mg/dL — AB (ref >40)
LDL CHOLESTEROL (CALC): 48 mg/dL
NON-HDL CHOLESTEROL (CALC): 65 mg/dL (ref <130)
TRIGLYCERIDES: 85 mg/dL (ref <150)

## 2018-07-16 LAB — CBC WITH DIFFERENTIAL/PLATELET
Basophils Absolute: 63 cells/uL (ref 0–200)
Basophils Relative: 0.9 %
EOS PCT: 1.6 %
Eosinophils Absolute: 112 cells/uL (ref 15–500)
HEMATOCRIT: 40.2 % (ref 38.5–50.0)
HEMOGLOBIN: 13.8 g/dL (ref 13.2–17.1)
LYMPHS ABS: 1463 {cells}/uL (ref 850–3900)
MCH: 30.1 pg (ref 27.0–33.0)
MCHC: 34.3 g/dL (ref 32.0–36.0)
MCV: 87.8 fL (ref 80.0–100.0)
MPV: 10.6 fL (ref 7.5–12.5)
Monocytes Relative: 8.1 %
Neutro Abs: 4795 cells/uL (ref 1500–7800)
Neutrophils Relative %: 68.5 %
Platelets: 96 10*3/uL — ABNORMAL LOW (ref 140–400)
RBC: 4.58 10*6/uL (ref 4.20–5.80)
RDW: 12.1 % (ref 11.0–15.0)
TOTAL LYMPHOCYTE: 20.9 %
WBC: 7 10*3/uL (ref 3.8–10.8)
WBCMIX: 567 {cells}/uL (ref 200–950)

## 2018-07-16 LAB — HEMOGLOBIN A1C
HEMOGLOBIN A1C: 6.6 %{Hb} — AB (ref <5.7)
MEAN PLASMA GLUCOSE: 143 (calc)
eAG (mmol/L): 7.9 (calc)

## 2018-07-16 LAB — TSH: TSH: 2.14 mIU/L (ref 0.40–4.50)

## 2018-07-16 LAB — SEDIMENTATION RATE: Sed Rate: 2 mm/h (ref 0–20)

## 2018-07-19 ENCOUNTER — Other Ambulatory Visit: Payer: Self-pay | Admitting: Family Medicine

## 2018-08-01 ENCOUNTER — Ambulatory Visit (INDEPENDENT_AMBULATORY_CARE_PROVIDER_SITE_OTHER): Payer: Medicare HMO | Admitting: Family Medicine

## 2018-08-01 VITALS — BP 128/56 | HR 76 | Temp 98.2°F | Resp 14 | Ht 69.0 in | Wt 166.0 lb

## 2018-08-01 DIAGNOSIS — K529 Noninfective gastroenteritis and colitis, unspecified: Secondary | ICD-10-CM | POA: Diagnosis not present

## 2018-08-01 MED ORDER — LOSARTAN POTASSIUM 50 MG PO TABS: 50.0000 mg | ORAL_TABLET | Freq: Every day | ORAL | 3 refills | Status: DC

## 2018-08-01 NOTE — Progress Notes (Signed)
Subjective:    Patient ID: Andrew [name], male    DOB: [birth year], 77 y.o.   MRN: [id number]  HPI  05/24/18 Patient is here reporting 2 to 3 weeks of watery diarrhea.  Initially it was 2-3 bowel movements a day.  Yesterday was 5 bowel movements.  Most are soft or loose.  Some are watery.  He denies any intestinal cramps.  He denies any intestinal pain.  He denies any nausea or vomiting or fever.  He denies any hospitalizations.  He denies any antibiotic use.  He denies any recent travel.  He has been eating a lot of watermelon, cantaloupe, tomatoes, and grapes.  He is also been drinking a lot of water.  Some of this may be an osmotic diarrhea secondary to these foods.  He thinks it may be due to losartan.  I explained to the patient that losartan has a 4% diarrhea rate so I believe that is unlikely.  He denies any weight loss or chills.  At that time, my plan was: I believe is most likely an osmotic diarrhea secondary to increased food intake.  I have recommended adding a fiber supplement.  Monitor the diarrhea over the next week or so and see if it improves with fiber supplement as a stool bulking agent.  I do not believe the losartan is causing his diarrhea.  If the diarrhea persist, I will check a GI pathogen panel as well as a sedimentation rate.  I also recommended decreasing his consumption of watermelon and grapes to see if that would also help.  07/14/18 Diarrhea has returned.  Patient is convinced is coming from the losartan.  He states that the diarrhea never started until he started the losartan.  It will come and go.  Some days he will have any diarrhea.  Other days he will have 2 or 3 loose watery bowel movements.  He denies any melena or hematochezia.  He denies any weight loss or fever or chills.  Change in his diet did not help.  He also has 3 insect bites on his left ankle.  These are 2 to 3 mm erythematous papules.  He has 2 insect bites on his right ankle.  These are also 2 to 3 mm  erythematous feels.  They itch.  They have been there for approximately 2 weeks and he denies any systemic symptoms.  At that time, my plan was:  Treat the insect bites with Elocon ointment applied once daily for 7 days.  These appear benign.  I do not believe the losartan is causing his diarrhea however I recommended that the patient discontinue the medication for 7 to 10 days.  If the diarrhea subsides, we can switch losartan to a different antihypertensive.  If the diarrhea continues, I would recommend GI consultation to evaluate for possible inflammatory bowel disease.  Also check a sed rate along with his fasting lab work regarding his prediabetes.  08/01/18 Ultimately, the patient decided to discontinue the losartan.  As anticipated the diarrhea did not change.  He states that he will have 5-6 loose stools a day.  If he takes an Imodium, he will go a day or 2 without having a bowel movement however then the loose stools return.  He denies any melena or hematochezia.  He denies any weight loss or fever.  He denies any crampy abdominal pain.  He denies any travel outside the country.  He denies any exposure to sick contacts.  He denies any recent  antibiotic use.  He denies any hospitalizations or institutionalizations that would make one believe he may have C. difficile colitis.  He denies any known food allergies.  He has not been screened for celiac disease.  He denies any issues with anxiety or stress that would suggest IBS.    Past Medical History:  Diagnosis Date  . Allergy    grass etc.  . Barrett's esophagus   . BPH (benign prostatic hyperplasia)   . Chest pain, atypical   . Chronic kidney disease    kidney stones  . Coronary artery disease   . GERD (gastroesophageal reflux disease)    Barrett's  . Hernia   . Hyperlipidemia   . Hypertension   . Myocardial infarction ([place])   . Ulcer 1960   Past Surgical History:  Procedure Laterality Date  . CARDIAC CATHETERIZATION  02/14/2007    MITRAL REGURGITATION. LV NORMAL  . CHOLECYSTECTOMY    . COLONOSCOPY    . CORONARY ARTERY BYPASS GRAFT     twice  2 by passes each time  . INGUINAL HERNIA REPAIR     bilateral done twice  . LITHOTRIPSY    . UPPER GASTROINTESTINAL ENDOSCOPY     Current Outpatient Medications on File Prior to Visit  Medication Sig Dispense Refill  . aspirin 81 MG tablet Take 81 mg by mouth daily.      [name] azelastine (ASTELIN) 0.1 % nasal spray Place 2 sprays into both nostrils 2 (two) times daily. Use in each nostril as directed 30 mL 12  . Cholecalciferol (VITAMIN D) 2000 units CAPS Take 1 capsule by mouth daily.    . Dutasteride-Tamsulosin HCl (JALYN) 0.5-0.4 MG CAPS Take 1 capsule by mouth daily.     . fluticasone (FLONASE) 50 MCG/ACT nasal spray Place 2 sprays into both nostrils daily.    . lansoprazole (PREVACID) 30 MG capsule Take 30 mg by mouth daily.     [name] losartan (COZAAR) 50 MG tablet Take 1 tablet (50 mg total) daily by mouth. 90 tablet 3  . metoprolol succinate (TOPROL-XL) 25 MG 24 hr tablet Take 1 tablet (25 mg total) by mouth daily. 90 tablet 3  . mometasone (ELOCON) 0.1 % ointment Apply topically daily. 45 g 0  . Multiple Vitamin (MULTIVITAMIN) tablet Take 1 tablet by mouth daily.      . nitroGLYCERIN (NITROSTAT) 0.4 MG SL tablet Place 1 tablet (0.4 mg total) under the tongue every 5 (five) minutes as needed. 50 tablet 4  . Omega-3 Fatty Acids (FISH OIL) 1000 MG CPDR Take 1,000 mg by mouth daily.      . simvastatin (ZOCOR) 10 MG tablet TAKE 1 TABLET AT BEDTIME 90 tablet 3   No current facility-administered medications on file prior to visit.    Allergies  Allergen Reactions  . Imdur [Isosorbide Mononitrate]     Unknown, per pt   . Meloxicam     Facial swelling    Social History   Socioeconomic History  . Marital status: Married    Spouse name: Not on file  . Number of children: Not on file  . Years of education: Not on file  . Highest education level: Not on file  Occupational  History  . Not on file  Social Needs  . Financial resource strain: Not on file  . Food insecurity:    Worry: Not on file    Inability: Not on file  . Transportation needs:    Medical: Not on file    Non-medical: Not  on file  Tobacco Use  . Smoking status: Never Smoker  . Smokeless tobacco: Never Used  Substance and Sexual Activity  . Alcohol use: No  . Drug use: No  . Sexual activity: Not on file  Lifestyle  . Physical activity:    Days per week: Not on file    Minutes per session: Not on file  . Stress: Not on file  Relationships  . Social connections:    Talks on phone: Not on file    Gets together: Not on file    Attends religious service: Not on file    Active member of club or organization: Not on file    Attends meetings of clubs or organizations: Not on file    Relationship status: Not on file  . Intimate partner violence:    Fear of current or ex partner: Not on file    Emotionally abused: Not on file    Physically abused: Not on file    Forced sexual activity: Not on file  Other Topics Concern  . Not on file  Social History Narrative  . Not on file      Review of Systems  Gastrointestinal: Positive for diarrhea.  All other systems reviewed and are negative.      Objective:   Physical Exam  Constitutional: He appears well-developed and well-nourished.  Cardiovascular: Normal rate, regular rhythm and normal heart sounds.  Pulmonary/Chest: Effort normal and breath sounds normal.  Abdominal: Soft. Bowel sounds are normal. He exhibits no distension and no mass. There is no tenderness. There is no rebound and no guarding.  Vitals reviewed.         Assessment & Plan:  Chronic diarrhea - Plan: Ambulatory referral to Gastroenterology, Gastrointestinal Pathogen Panel PCR  Patient appears to have chronic diarrhea.  I will check a GI pathogen panel to rule out an occult infection such as C. difficile colitis however based on his symptoms and his clinical  appearance and his exam I have a low index of suspicion for any infection causing this.  I will also check a stool O&P.  Consider checking fecal fat to evaluate for malabsorptive diarrhea however the patient is not losing weight and has no history of pancreatic insufficiency.  Therefore I recommended a GI consultation to evaluate for inflammatory bowel disease such as microscopic colitis, Crohn's disease, etc.  Meanwhile I will add a probiotic such as align 1 pill a day as well as a fiber supplement.  He can use Imodium as needed to help manage the diarrhea.  I refilled his losartan however at the present time his blood pressure is well controlled off the medication.  Therefore he will not resume the medication unless his blood pressure rises above 140/90.  His age, I doubt celiac disease.  I will await GI consultation prior to checking for celiac disease

## 2018-08-02 ENCOUNTER — Encounter: Payer: Self-pay | Admitting: Physician Assistant

## 2018-08-02 ENCOUNTER — Other Ambulatory Visit: Payer: Medicare HMO

## 2018-08-02 DIAGNOSIS — K529 Noninfective gastroenteritis and colitis, unspecified: Secondary | ICD-10-CM | POA: Diagnosis not present

## 2018-08-03 ENCOUNTER — Ambulatory Visit (INDEPENDENT_AMBULATORY_CARE_PROVIDER_SITE_OTHER): Payer: Medicare HMO | Admitting: Physician Assistant

## 2018-08-03 ENCOUNTER — Encounter: Payer: Self-pay | Admitting: Physician Assistant

## 2018-08-03 ENCOUNTER — Other Ambulatory Visit: Payer: Medicare HMO

## 2018-08-03 VITALS — BP 120/60 | HR 80 | Temp 98.6°F | Ht 69.0 in | Wt 167.0 lb

## 2018-08-03 DIAGNOSIS — R197 Diarrhea, unspecified: Secondary | ICD-10-CM | POA: Diagnosis not present

## 2018-08-03 NOTE — Progress Notes (Signed)
Agree with assessment and plan as outlined.  

## 2018-08-03 NOTE — Patient Instructions (Addendum)
If you are age 79 or older, your body mass index should be between 23-30. Your Body mass index is 24.66 kg/m. If this is out of the aforementioned range listed, please consider follow up with your Primary Care Provider.  If you are age 49 or younger, your body mass index should be between 19-25. Your Body mass index is 24.66 kg/m. If this is out of the aformentioned range listed, please consider follow up with your Primary Care Provider.   Your provider has requested that you go to the basement level for lab work before leaving today. Press "B" on the elevator. The lab is located at the first door on the left as you exit the elevator. C Diff  Start Fiber 25-35 grams total daily ie. Benefiber, Metamucil, Citrucel.  Start Align daily.  Coupon given. (over-the-counter)  Follow up with me on September 07, 2018 at 10:15 am.  Thank you for choosing me and [facility].   Ellouise Newer, PA-C

## 2018-08-03 NOTE — Progress Notes (Signed)
Chief Complaint:   Diarrhea  HPI:    [name] is a 77 year old Caucasian male with a past medical history as listed below, previously known to Andrew [name], who was referred to me by Andrew Frizzle, MD for a complaint of diarrhea.      04/27/2011 colonoscopy with Andrew [name] with severe diverticulosis in the sigmoid/descending colon, sessile polyp in the sigmoid colon otherwise normal.  Repeat was recommended in 10 years as pathology returned benign.    08/02/2018 patient seen by PCP for diarrhea on his third occasion, continued to describe 3 weeks of diarrhea.  Initially this was thought related to Losartan.  Yesterday had GI pathogen panel as well as ova and parasite exam ordered.  Previously 07/15/2018 CBC, CMP and TSH were normal.    Today, the patient tells me that about 3 to 4 weeks ago now he started with loose stool somewhat out of the blue.  Apparently has at least 2-3 loose/watery stools per day and sometimes up to 6/day.  He has no accompanying abdominal pain or rectal pain and has seen no blood in his stool.  This has continued over the past 3 to 4 weeks.  Patient initially tried to decrease sugary foods and fruits that he was eating but this did not help, he also came off of his Losartan over the past 2 to 3 weeks but this has not changed anything either.  Occasionally patient will use an Imodium which will stop him up for 2 to 3 days but then it comes right back.    Social history is positive for having a leak in his well pipe but the patient tells me that he changed to bottled water.    Denies fever, chills, weight loss, anorexia, nausea, vomiting, heartburn, reflux or symptoms that awaken him from sleep.  Past Medical History:  Diagnosis Date  . Allergy    grass etc.  . Barrett's esophagus   . BPH (benign prostatic hyperplasia)   . Chest pain, atypical   . Chronic kidney disease    kidney stones  . Coronary artery disease   . GERD (gastroesophageal reflux disease)    Barrett's  . Hernia   . Hyperlipidemia   . Hypertension   . Myocardial infarction ([place])   . Ulcer 1960    Past Surgical History:  Procedure Laterality Date  . CARDIAC CATHETERIZATION  02/14/2007   MITRAL REGURGITATION. LV NORMAL  . CHOLECYSTECTOMY    . COLONOSCOPY    . CORONARY ARTERY BYPASS GRAFT     twice  2 by passes each time  . INGUINAL HERNIA REPAIR     bilateral done twice  . LITHOTRIPSY    . UPPER GASTROINTESTINAL ENDOSCOPY      Current Outpatient Medications  Medication Sig Dispense Refill  . aspirin 81 MG tablet Take 81 mg by mouth daily.      . Cholecalciferol (VITAMIN D) 2000 units CAPS Take 1 capsule by mouth daily.    . Dutasteride-Tamsulosin HCl (JALYN) 0.5-0.4 MG CAPS Take 1 capsule by mouth daily.     . lansoprazole (PREVACID) 30 MG capsule Take 30 mg by mouth daily.     . metoprolol succinate (TOPROL-XL) 25 MG 24 hr tablet Take 1 tablet (25 mg total) by mouth daily. 90 tablet 3  . Multiple Vitamin (MULTIVITAMIN) tablet Take 1 tablet by mouth daily.      . nitroGLYCERIN (NITROSTAT) 0.4 MG SL tablet Place 1 tablet (0.4 mg total) under the tongue every 5 (  five) minutes as needed. 50 tablet 4  . Omega-3 Fatty Acids (FISH OIL) 1000 MG CPDR Take 1,000 mg by mouth daily.      . simvastatin (ZOCOR) 10 MG tablet TAKE 1 TABLET AT BEDTIME 90 tablet 3   No current facility-administered medications for this visit.     Allergies as of 08/03/2018 - Review Complete 08/03/2018  Allergen Reaction Noted  . Imdur [isosorbide mononitrate]  04/01/2011  . Meloxicam  12/27/2010    Family History  Problem Relation Age of Onset  . Heart disease Father   . Heart attack Mother   . Colon cancer Neg Hx   . Esophageal cancer Neg Hx   . Rectal cancer Neg Hx     Social History   Socioeconomic History  . Marital status: Married    Spouse name: Not on file  . Number of children: 2  . Years of education: Not on file  . Highest education level: Not on file  Occupational History   . Occupation: Office manager  Social Needs  . Financial resource strain: Not on file  . Food insecurity:    Worry: Not on file    Inability: Not on file  . Transportation needs:    Medical: Not on file    Non-medical: Not on file  Tobacco Use  . Smoking status: Never Smoker  . Smokeless tobacco: Never Used  Substance and Sexual Activity  . Alcohol use: No  . Drug use: No  . Sexual activity: Not on file  Lifestyle  . Physical activity:    Days per week: Not on file    Minutes per session: Not on file  . Stress: Not on file  Relationships  . Social connections:    Talks on phone: Not on file    Gets together: Not on file    Attends religious service: Not on file    Active member of club or organization: Not on file    Attends meetings of clubs or organizations: Not on file    Relationship status: Not on file  . Intimate partner violence:    Fear of current or ex partner: Not on file    Emotionally abused: Not on file    Physically abused: Not on file    Forced sexual activity: Not on file  Other Topics Concern  . Not on file  Social History Narrative  . Not on file    Review of Systems:    Constitutional: No weight loss, fever or chills Skin: No rash  Cardiovascular: No chest pain  Respiratory: No SOB  Gastrointestinal: See HPI and otherwise negative Genitourinary: No dysuria  Neurological: No headache, dizziness or syncope Musculoskeletal: No new muscle or joint pain Hematologic: No bleeding Psychiatric: No history of depression or anxiety   Physical Exam:  Vital signs: BP 120/60   Pulse 80   Temp 98.6 F (37 C)   Ht 5\' 9"  (1.753 m)   Wt 167 lb (75.8 kg)   BMI 24.66 kg/m   Constitutional:   Pleasant Caucasian male appears to be in NAD, Well developed, Well nourished, alert and cooperative Head:  Normocephalic and atraumatic. Eyes:   PEERL, EOMI. No icterus. Conjunctiva pink. Ears:  Normal auditory acuity. Neck:  Supple Throat: Oral cavity  and pharynx without inflammation, swelling or lesion.  Respiratory: Respirations even and unlabored. Lungs clear to auscultation bilaterally.   No wheezes, crackles, or rhonchi.  Cardiovascular: Normal S1, S2. No MRG. Regular rate and rhythm. No peripheral  edema, cyanosis or pallor.  Gastrointestinal:  Soft, nondistended, nontender. No rebound or guarding. Normal bowel sounds. No appreciable masses or hepatomegaly. Rectal:  Not performed.  Msk:  Symmetrical without gross deformities. Without edema, no deformity or joint abnormality.  Neurologic:  Alert and  oriented x4;  grossly normal neurologically.  Skin:   Dry and intact without significant lesions or rashes. Psychiatric: Demonstrates good judgement and reason without abnormal affect or behaviors.  RELEVANT LABS AND IMAGING: CBC    Component Value Date/Time   WBC 7.0 07/15/2018 0848   RBC 4.58 07/15/2018 0848   HGB 13.8 07/15/2018 0848   HCT 40.2 07/15/2018 0848   PLT 96 (L) 07/15/2018 0848   MCV 87.8 07/15/2018 0848   MCH 30.1 07/15/2018 0848   MCHC 34.3 07/15/2018 0848   RDW 12.1 07/15/2018 0848   LYMPHSABS 1,463 07/15/2018 0848   MONOABS 476 02/10/2017 0811   EOSABS 112 07/15/2018 0848   BASOSABS 63 07/15/2018 0848    CMP     Component Value Date/Time   NA 139 07/15/2018 0848   K 4.6 07/15/2018 0848   CL 104 07/15/2018 0848   CO2 25 07/15/2018 0848   GLUCOSE 157 (H) 07/15/2018 0848   BUN 19 07/15/2018 0848   CREATININE 0.92 07/15/2018 0848   CALCIUM 9.0 07/15/2018 0848   PROT 6.0 (L) 07/15/2018 0848   ALBUMIN 3.9 02/10/2017 0811   AST 15 07/15/2018 0848   ALT 11 07/15/2018 0848   ALKPHOS 52 02/10/2017 0811   BILITOT 0.6 07/15/2018 0848   GFRNONAA 84 02/25/2018 0847   GFRAA 97 02/25/2018 0847    Assessment: 1.  Diarrhea: For the past 3 to 4 weeks, recent GI pathogen panel and O&P are pending from PCP, CBC, CMP and TSH are normal, Imodium does stop the diarrhea but will come back, last colonoscopy in 2012 with  benign polyp repeat recommended in 10 years, no rectal bleeding; consider infectious versus other cause  Plan: 1.  Recommend patient increase fiber in his diet to at least 25-35 g/day with use of fiber supplement such as Metamucil, Citrucel or Benefiber. 2.  Recommend patient start Align once daily for the next 2 months. 3.  Ordered C. difficile testing 4.  Discussed with patient that if he is still having diarrhea at the time of follow-up in the next 3 to 4 weeks we will discuss a colonoscopy. Assigned to Dr. Havery Moros today.  Ellouise Newer, PA-C [facility] 08/03/2018, 10:23 AM  Cc: Andrew Frizzle, MD

## 2018-08-04 ENCOUNTER — Other Ambulatory Visit: Payer: Medicare HMO

## 2018-08-04 DIAGNOSIS — R197 Diarrhea, unspecified: Secondary | ICD-10-CM

## 2018-08-05 ENCOUNTER — Ambulatory Visit: Payer: Medicare HMO | Admitting: Physician Assistant

## 2018-08-05 LAB — GASTROINTESTINAL PATHOGEN PANEL PCR
C. difficile Tox A/B, PCR: NOT DETECTED
CAMPYLOBACTER, PCR: NOT DETECTED
Cryptosporidium, PCR: NOT DETECTED
E coli (ETEC) LT/ST PCR: NOT DETECTED
E coli (STEC) stx1/stx2, PCR: NOT DETECTED
E coli 0157, PCR: NOT DETECTED
GIARDIA LAMBLIA, PCR: NOT DETECTED
NOROVIRUS, PCR: NOT DETECTED
ROTAVIRUS, PCR: NOT DETECTED
SALMONELLA, PCR: NOT DETECTED
SHIGELLA, PCR: NOT DETECTED

## 2018-08-05 LAB — OVA AND PARASITE EXAMINATION
CONCENTRATE RESULT:: NONE SEEN
MICRO NUMBER:: 91268639
SPECIMEN QUALITY: ADEQUATE
TRICHROME RESULT:: NONE SEEN

## 2018-08-05 LAB — CLOSTRIDIUM DIFFICILE TOXIN B, QUALITATIVE, REAL-TIME PCR: Toxigenic C. Difficile by PCR: NOT DETECTED

## 2018-08-29 ENCOUNTER — Encounter: Payer: Self-pay | Admitting: Family Medicine

## 2018-08-29 ENCOUNTER — Ambulatory Visit (INDEPENDENT_AMBULATORY_CARE_PROVIDER_SITE_OTHER): Payer: Medicare HMO | Admitting: Family Medicine

## 2018-08-29 VITALS — BP 132/70 | HR 74 | Temp 98.1°F | Resp 16 | Ht 69.0 in | Wt 166.0 lb

## 2018-08-29 DIAGNOSIS — S46002A Unspecified injury of muscle(s) and tendon(s) of the rotator cuff of left shoulder, initial encounter: Secondary | ICD-10-CM | POA: Diagnosis not present

## 2018-08-29 MED ORDER — FINASTERIDE 5 MG PO TABS: 5.0000 mg | ORAL_TABLET | Freq: Every day | ORAL | 5 refills | Status: DC

## 2018-08-29 MED ORDER — TAMSULOSIN HCL 0.4 MG PO CAPS: 0.4000 mg | ORAL_CAPSULE | Freq: Every day | ORAL | 3 refills | Status: DC

## 2018-08-29 NOTE — Progress Notes (Signed)
Subjective:    Patient ID: Andrew [name], male    DOB: 03-15-1941, 77 y.o.   MRN: [id number]  HPI Patient was cranking a chainsaw recently by extending his left arm and pulling down with his right arm.  He jerked hard and felt a popping sensation in his left shoulder.  Since that time, he is been unable to abduct his left shoulder greater than 90 degrees.  He has pain with drop test.  He has pain with empty can sign.  He has pain with Hawkins maneuver.  Abduction greater than 90 degrees elicits significant pain.  He also has weakness with resisted abduction suggesting an injury to the supraspinatus tendon Past Medical History:  Diagnosis Date  . Allergy    grass etc.  . Barrett's esophagus   . BPH (benign prostatic hyperplasia)   . Chest pain, atypical   . Chronic kidney disease    kidney stones  . Coronary artery disease   . GERD (gastroesophageal reflux disease)    Barrett's  . Hernia   . Hyperlipidemia   . Hypertension   . Myocardial infarction ([place])   . Ulcer 1960   Past Surgical History:  Procedure Laterality Date  . CARDIAC CATHETERIZATION  02/14/2007   MITRAL REGURGITATION. LV NORMAL  . CHOLECYSTECTOMY    . COLONOSCOPY    . CORONARY ARTERY BYPASS GRAFT     twice  2 by passes each time  . INGUINAL HERNIA REPAIR     bilateral done twice  . LITHOTRIPSY    . UPPER GASTROINTESTINAL ENDOSCOPY     Current Outpatient Medications on File Prior to Visit  Medication Sig Dispense Refill  . aspirin 81 MG tablet Take 81 mg by mouth daily.      . Cholecalciferol (VITAMIN D) 2000 units CAPS Take 1 capsule by mouth daily.    . Dutasteride-Tamsulosin HCl (JALYN) 0.5-0.4 MG CAPS Take 1 capsule by mouth daily.     . lansoprazole (PREVACID) 30 MG capsule Take 30 mg by mouth daily.     . metoprolol succinate (TOPROL-XL) 25 MG 24 hr tablet Take 1 tablet (25 mg total) by mouth daily. 90 tablet 3  . Multiple Vitamin (MULTIVITAMIN) tablet Take 1 tablet by mouth daily.      . nitroGLYCERIN  (NITROSTAT) 0.4 MG SL tablet Place 1 tablet (0.4 mg total) under the tongue every 5 (five) minutes as needed. 50 tablet 4  . Omega-3 Fatty Acids (FISH OIL) 1000 MG CPDR Take 1,000 mg by mouth daily.      . simvastatin (ZOCOR) 10 MG tablet TAKE 1 TABLET AT BEDTIME 90 tablet 3   No current facility-administered medications on file prior to visit.    Allergies  Allergen Reactions  . Imdur [Isosorbide Mononitrate]     Unknown, per pt   . Meloxicam     Facial swelling    Social History   Socioeconomic History  . Marital status: Married    Spouse name: Not on file  . Number of children: 2  . Years of education: Not on file  . Highest education level: Not on file  Occupational History  . Occupation: Office manager  Social Needs  . Financial resource strain: Not on file  . Food insecurity:    Worry: Not on file    Inability: Not on file  . Transportation needs:    Medical: Not on file    Non-medical: Not on file  Tobacco Use  . Smoking status: Never Smoker  .  Smokeless tobacco: Never Used  Substance and Sexual Activity  . Alcohol use: No  . Drug use: No  . Sexual activity: Not on file  Lifestyle  . Physical activity:    Days per week: Not on file    Minutes per session: Not on file  . Stress: Not on file  Relationships  . Social connections:    Talks on phone: Not on file    Gets together: Not on file    Attends religious service: Not on file    Active member of club or organization: Not on file    Attends meetings of clubs or organizations: Not on file    Relationship status: Not on file  . Intimate partner violence:    Fear of current or ex partner: Not on file    Emotionally abused: Not on file    Physically abused: Not on file    Forced sexual activity: Not on file  Other Topics Concern  . Not on file  Social History Narrative  . Not on file      Review of Systems  All other systems reviewed and are negative.      Objective:   Physical Exam    Constitutional: He appears well-developed and well-nourished.  Cardiovascular: Normal rate, regular rhythm and normal heart sounds.  Pulmonary/Chest: Effort normal and breath sounds normal.  Abdominal: Soft. Bowel sounds are normal. He exhibits no distension and no mass. There is no tenderness. There is no rebound and no guarding.  Musculoskeletal:       Left shoulder: He exhibits decreased range of motion, tenderness, bony tenderness, pain and decreased strength.  Vitals reviewed.         Assessment & Plan:  Injury of tendon of left rotator cuff, initial encounter Patient has tendinitis versus a tendon injury.  We elect to proceed with a subacromial cortisone injection.  Using sterile technique, the patient's left shoulder was injected with a mixture of 2 cc lidocaine, 2 cc of Marcaine, and 2 cc of 40 mg/mL Kenalog.  Patient tolerated the procedure well without complication.  Recheck if no better in 2 weeks

## 2018-09-05 ENCOUNTER — Ambulatory Visit: Payer: Medicare HMO

## 2018-09-07 ENCOUNTER — Ambulatory Visit (INDEPENDENT_AMBULATORY_CARE_PROVIDER_SITE_OTHER): Payer: Medicare HMO | Admitting: Family Medicine

## 2018-09-07 ENCOUNTER — Ambulatory Visit (INDEPENDENT_AMBULATORY_CARE_PROVIDER_SITE_OTHER): Payer: Medicare HMO | Admitting: Physician Assistant

## 2018-09-07 ENCOUNTER — Encounter: Payer: Self-pay | Admitting: Physician Assistant

## 2018-09-07 VITALS — BP 120/60 | HR 75 | Ht 66.73 in | Wt 163.1 lb

## 2018-09-07 DIAGNOSIS — R197 Diarrhea, unspecified: Secondary | ICD-10-CM

## 2018-09-07 DIAGNOSIS — Z23 Encounter for immunization: Secondary | ICD-10-CM | POA: Diagnosis not present

## 2018-09-07 DIAGNOSIS — R194 Change in bowel habit: Secondary | ICD-10-CM | POA: Diagnosis not present

## 2018-09-07 MED ORDER — NA SULFATE-K SULFATE-MG SULF 17.5-3.13-1.6 GM/177ML PO SOLN: ORAL | 0 refills | Status: DC

## 2018-09-07 NOTE — Patient Instructions (Addendum)
If you are age 79 or older, your body mass index should be between 23-30. Your Body mass index is 25.75 kg/m. If this is out of the aforementioned range listed, please consider follow up with your Primary Care Provider.  If you are age 55 or younger, your body mass index should be between 19-25. Your Body mass index is 25.75 kg/m. If this is out of the aformentioned range listed, please consider follow up with your Primary Care Provider.   You have been scheduled for a colonoscopy. Please follow written instructions given to you at your visit today.  Please pick up your prep supplies at the pharmacy within the next 1-3 days. If you use inhalers (even only as needed), please bring them with you on the day of your procedure. Your physician has requested that you go to www.startemmi.com and enter the access code given to you at your visit today. This web site gives a general overview about your procedure. However, you should still follow specific instructions given to you by our office regarding your preparation for the procedure.  We have sent the following medications to your pharmacy for you to pick up at your convenience: Suprep  Continue Fiber.  Continue Align for one more month.  Thank you for choosing me and [facility].  Ellouise Newer, PA-C

## 2018-09-07 NOTE — Progress Notes (Signed)
Chief Complaint: Follow-up diarrhea  HPI:    [name] is a 77 year old Caucasian male with a past medical history as listed below, known to Andrew [name], who was referred to me by Andrew Frizzle, MD for follow-up of diarrhea.    04/27/2011 colonoscopy with Andrew [name] with severe diverticulosis in the sigmoid/descending colon, sessile polyp in the sigmoid colon and otherwise normal.  Repeat recommended in 10 years.  Recall patient recently had GI pathogen panel and ova and parasite exam ordered which was negative/normal.  Previously 07/15/2018 CBC, CMP and TSH were.      08/03/2018 patient seen in clinic and described that for 3 to 4 weeks he started with some loose stool out of the blue had at least 2-3 loose/watery stools per day and sometimes up to 6/day.  Stopping Losartan did not help.  Usually would use Imodium which would stop him up for 2 to 3 days but then it would come right back.  At that time recommend increasing fiber as well as starting Align.  Ordered C. difficile testing.  All stool testing was negative.    Today, patient explains that he has been using his fiber supplement daily and Align daily.  Tells me that he has had a decrease in the "gushing water" stools.  Tells me now that he may have one loose stool a day or 2 or possibly up to 4 but this is only on occasion.  Also has days where he has no bowel movement at all.  Continues to deny abdominal pain.    Denies fever, chills, weight loss, anorexia, nausea, vomiting or symptoms that awaken him from sleep.     Past Medical History:  Diagnosis Date  . Allergy    grass etc.  . Barrett's esophagus   . BPH (benign prostatic hyperplasia)   . Chest pain, atypical   . Chronic kidney disease    kidney stones  . Coronary artery disease   . GERD (gastroesophageal reflux disease)    Barrett's  . Hernia   . Hyperlipidemia   . Hypertension   . Myocardial infarction ([place])   . Ulcer 1960    Past Surgical History:  Procedure  Laterality Date  . CARDIAC CATHETERIZATION  02/14/2007   MITRAL REGURGITATION. LV NORMAL  . CHOLECYSTECTOMY    . COLONOSCOPY    . CORONARY ARTERY BYPASS GRAFT     twice  2 by passes each time  . INGUINAL HERNIA REPAIR     bilateral done twice  . LITHOTRIPSY    . UPPER GASTROINTESTINAL ENDOSCOPY      Current Outpatient Medications  Medication Sig Dispense Refill  . aspirin 81 MG tablet Take 81 mg by mouth daily.      . Cholecalciferol (VITAMIN D) 2000 units CAPS Take 1 capsule by mouth daily.    . finasteride (PROSCAR) 5 MG tablet Take 1 tablet (5 mg total) by mouth daily. 30 tablet 5  . lansoprazole (PREVACID) 30 MG capsule Take 30 mg by mouth daily.     . metoprolol succinate (TOPROL-XL) 25 MG 24 hr tablet Take 1 tablet (25 mg total) by mouth daily. 90 tablet 3  . Multiple Vitamin (MULTIVITAMIN) tablet Take 1 tablet by mouth daily.      . nitroGLYCERIN (NITROSTAT) 0.4 MG SL tablet Place 1 tablet (0.4 mg total) under the tongue every 5 (five) minutes as needed. 50 tablet 4  . Omega-3 Fatty Acids (FISH OIL) 1000 MG CPDR Take 1,000 mg by mouth daily.      [name]  simvastatin (ZOCOR) 10 MG tablet TAKE 1 TABLET AT BEDTIME 90 tablet 3  . tamsulosin (FLOMAX) 0.4 MG CAPS capsule Take 1 capsule (0.4 mg total) by mouth daily. Stop jalyn 30 capsule 3   No current facility-administered medications for this visit.     Allergies as of 09/07/2018 - Review Complete 09/07/2018  Allergen Reaction Noted  . Imdur [isosorbide mononitrate]  04/01/2011  . Meloxicam  12/27/2010    Family History  Problem Relation Age of Onset  . Heart disease Father   . Heart attack Mother   . Colon cancer Neg Hx   . Esophageal cancer Neg Hx   . Rectal cancer Neg Hx     Social History   Socioeconomic History  . Marital status: Married    Spouse name: Not on file  . Number of children: 2  . Years of education: Not on file  . Highest education level: Not on file  Occupational History  . Occupation: Chemical engineer  Social Needs  . Financial resource strain: Not on file  . Food insecurity:    Worry: Not on file    Inability: Not on file  . Transportation needs:    Medical: Not on file    Non-medical: Not on file  Tobacco Use  . Smoking status: Never Smoker  . Smokeless tobacco: Never Used  Substance and Sexual Activity  . Alcohol use: No  . Drug use: No  . Sexual activity: Not on file  Lifestyle  . Physical activity:    Days per week: Not on file    Minutes per session: Not on file  . Stress: Not on file  Relationships  . Social connections:    Talks on phone: Not on file    Gets together: Not on file    Attends religious service: Not on file    Active member of club or organization: Not on file    Attends meetings of clubs or organizations: Not on file    Relationship status: Not on file  . Intimate partner violence:    Fear of current or ex partner: Not on file    Emotionally abused: Not on file    Physically abused: Not on file    Forced sexual activity: Not on file  Other Topics Concern  . Not on file  Social History Narrative  . Not on file    Review of Systems:    Constitutional: No weight loss, fever or chills Cardiovascular: No chest pain Respiratory: No SOB  Gastrointestinal: See HPI and otherwise negative   Physical Exam:  Vital signs: BP 120/60   Pulse 75   Ht 5' 6.73" (1.695 m)   Wt 163 lb 2 oz (74 kg)   BMI 25.75 kg/m   Constitutional:   Pleasant Elderly Caucasian male appears to be in NAD, Well developed, Well nourished, alert and cooperative Respiratory: Respirations even and unlabored. Lungs clear to auscultation bilaterally.   No wheezes, crackles, or rhonchi.  Cardiovascular: Normal S1, S2. No MRG. Regular rate and rhythm. No peripheral edema, cyanosis or pallor.  Gastrointestinal:  Soft, nondistended, nontender. No rebound or guarding. Normal bowel sounds. No appreciable masses or hepatomegaly. Psychiatric:Demonstrates good judgement  and reason without abnormal affect or behaviors.  No recent labs or imaging.  Assessment: 1.  Change in bowel habits: Towards diarrhea over the past 6-8 weeks, some decrease with addition of fiber and a probiotic over the past month, but does still continue, stool studies including GI pathogen panel,  O&P and C. difficile were negative, labs including CBC, CMP and TSH were normal; consider IBS versus other  Plan: 1.  Scheduled patient for a colonoscopy in the [facility] with Andrew [name].  Did discuss risks, benefits, limitations and alternatives and patient agrees to proceed. 2.  Continue fiber daily 3.  Continue Align probiotic for the next month 4.  Discussed with patient that he can use Imodium as needed if he is planning to go out.  In the past he had told me this stopped him up for 2 to 3 days.  Told him he could use a half of a tablet if needed. 5.  Patient to follow in clinic per recommendations from Andrew [name] after time of procedure.  Ellouise Newer, PA-C [facility] 09/07/2018, 10:16 AM  Cc: Andrew Frizzle, MD

## 2018-09-07 NOTE — Progress Notes (Signed)
Agree with assessment and plan as outlined.  

## 2018-09-14 ENCOUNTER — Ambulatory Visit (AMBULATORY_SURGERY_CENTER): Payer: Medicare HMO | Admitting: Gastroenterology

## 2018-09-14 ENCOUNTER — Encounter: Payer: Self-pay | Admitting: Gastroenterology

## 2018-09-14 ENCOUNTER — Telehealth: Payer: Self-pay | Admitting: Physician Assistant

## 2018-09-14 VITALS — BP 124/84 | HR 80 | Temp 98.0°F | Resp 18 | Ht 66.0 in | Wt 163.0 lb

## 2018-09-14 DIAGNOSIS — D125 Benign neoplasm of sigmoid colon: Secondary | ICD-10-CM

## 2018-09-14 DIAGNOSIS — D122 Benign neoplasm of ascending colon: Secondary | ICD-10-CM

## 2018-09-14 DIAGNOSIS — R197 Diarrhea, unspecified: Secondary | ICD-10-CM | POA: Diagnosis not present

## 2018-09-14 DIAGNOSIS — D123 Benign neoplasm of transverse colon: Secondary | ICD-10-CM | POA: Diagnosis not present

## 2018-09-14 MED ORDER — SODIUM CHLORIDE 0.9 % IV SOLN: 500.0000 mL | Freq: Once | INTRAVENOUS | Status: DC

## 2018-09-14 NOTE — Patient Instructions (Signed)
Thank you for allowing [place] to care for you today!  Await pathology results by mail, approximately 2 weeks.  Next colonoscopy will be determined at that time.  Resume previous diet and medications today.  Return to normal activities tomorrow.   YOU HAD AN ENDOSCOPIC PROCEDURE TODAY AT [facility]:   Refer to the procedure report that was given to you for any specific questions about what was found during the examination.  If the procedure report does not answer your questions, please call your gastroenterologist to clarify.  If you requested that your care partner not be given the details of your procedure findings, then the procedure report has been included in a sealed envelope for you to review at your convenience later.  YOU SHOULD EXPECT: Some feelings of bloating in the abdomen. Passage of more gas than usual.  Walking can help get rid of the air that was put into your GI tract during the procedure and reduce the bloating. If you had a lower endoscopy (such as a colonoscopy or flexible sigmoidoscopy) you may notice spotting of blood in your stool or on the toilet paper. If you underwent a bowel prep for your procedure, you may not have a normal bowel movement for a few days.  Please Note:  You might notice some irritation and congestion in your nose or some drainage.  This is from the oxygen used during your procedure.  There is no need for concern and it should clear up in a day or so.  SYMPTOMS TO REPORT IMMEDIATELY:   Following lower endoscopy (colonoscopy or flexible sigmoidoscopy):  Excessive amounts of blood in the stool  Significant tenderness or worsening of abdominal pains  Swelling of the abdomen that is new, acute  Fever of 100F or higher  For urgent or emergent issues, a gastroenterologist can be reached at any hour by calling [phone].   DIET:  We do recommend a small meal at first, but then you may proceed to your regular diet.  Drink plenty of  fluids but you should avoid alcoholic beverages for 24 hours.  ACTIVITY:  You should plan to take it easy for the rest of today and you should NOT DRIVE or use heavy machinery until tomorrow (because of the sedation medicines used during the test).    FOLLOW UP: Our staff will call the number listed on your records the next business day following your procedure to check on you and address any questions or concerns that you may have regarding the information given to you following your procedure. If we do not reach you, we will leave a message.  However, if you are feeling well and you are not experiencing any problems, there is no need to return our call.  We will assume that you have returned to your regular daily activities without incident.  If any biopsies were taken you will be contacted by phone or by letter within the next 1-3 weeks.  Please call us at [phone] if you have not heard about the biopsies in 3 weeks.    SIGNATURES/CONFIDENTIALITY: You and/or your care partner have signed paperwork which will be entered into your electronic medical record.  These signatures attest to the fact that that the information above on your After Visit Summary has been reviewed and is understood.  Full responsibility of the confidentiality of this discharge information lies with you and/or your care-partner.

## 2018-09-14 NOTE — Telephone Encounter (Signed)
Pt has procedure today and has some questions regarding prep. Pls call him.

## 2018-09-14 NOTE — Progress Notes (Signed)
Called to room to assist during endoscopic procedure.  Patient ID and intended procedure confirmed with present staff. Received instructions for my participation in the procedure from the performing physician.  

## 2018-09-14 NOTE — Telephone Encounter (Signed)
All questions answered

## 2018-09-14 NOTE — Progress Notes (Signed)
PT taken to PACU. Monitors in place. VSS. Report given to RN. 

## 2018-09-15 ENCOUNTER — Telehealth: Payer: Self-pay

## 2018-09-15 NOTE — Telephone Encounter (Signed)
  Follow up Call-  Call back number 09/14/2018  Post procedure Call Back phone  # [phone]  Permission to leave phone message Yes  Some recent data might be hidden     Patient questions:  Do you have a fever, pain , or abdominal swelling? No. Pain Score  0 *  Have you tolerated food without any problems? Yes.    Have you been able to return to your normal activities? Yes.    Do you have any questions about your discharge instructions: Diet   No. Medications  No. Follow up visit  No.  Do you have questions or concerns about your Care? No.  Actions: * If pain score is 4 or above: No action needed, pain <4.

## 2018-09-15 NOTE — Op Note (Signed)
[facility] Patient Name: Andrew [name] Procedure Date: 09/14/2018 4:28 PM MRN: [id number] Endoscopist: Remo Lipps P. Havery Moros , MD Age: 77 Referring MD:  Date of Birth: 09-03-1941 Gender: Male Account #: [id number] Procedure:                Colonoscopy Indications:              Clinically significant diarrhea of unexplained                            origin, negative stool testing Medicines:                Monitored Anesthesia Care Procedure:                Pre-Anesthesia Assessment:                           - Prior to the procedure, a History and Physical                            was performed, and patient medications and                            allergies were reviewed. The patient's tolerance of                            previous anesthesia was also reviewed. The risks                            and benefits of the procedure and the sedation                            options and risks were discussed with the patient.                            All questions were answered, and informed consent                            was obtained. Prior Anticoagulants: The patient has                            taken no previous anticoagulant or antiplatelet                            agents. ASA Grade Assessment: III - A patient with                            severe systemic disease. After reviewing the risks                            and benefits, the patient was deemed in                            satisfactory condition to undergo the procedure.  After obtaining informed consent, the colonoscope                            was passed under direct vision. Throughout the                            procedure, the patient's blood pressure, pulse, and                            oxygen saturations were monitored continuously. The                            Colonoscope was introduced through the anus and                            advanced to the the cecum,  identified by                            appendiceal orifice and ileocecal valve. The                            colonoscopy was performed without difficulty. The                            patient tolerated the procedure well. The quality                            of the bowel preparation was adequate. The                            ileocecal valve, appendiceal orifice, and rectum                            were photographed. Scope In: 4:36:04 PM Scope Out: 5:02:54 PM Scope Withdrawal Time: 0 hours 20 minutes 14 seconds  Total Procedure Duration: 0 hours 26 minutes 50 seconds  Findings:                 The perianal and digital rectal examinations were                            normal.                           A large amount of liquid stool was found in the                            entire colon, making visualization difficult.                            Lavage of the colon using copious amounts of                            sterile water was performed which prolonged this  procedure, resulting in clearance with adequate                            visualization.                           Two sessile polyps were found in the ascending                            colon. The polyps were 3 to 4 mm in size. These                            polyps were removed with a cold snare. Resection                            and retrieval were complete.                           Two sessile polyps were found in the ascending                            colon. The polyps were diminutive in size. These                            polyps were removed with a cold biopsy forceps.                            Resection and retrieval were complete.                           A 3 mm polyp was found in the transverse colon. The                            polyp was sessile. The polyp was removed with a                            cold snare. Resection and retrieval were complete.                            A diminutive polyp was found in the sigmoid colon.                            The polyp was sessile. The polyp was removed with a                            cold biopsy forceps. Resection and retrieval were                            complete.                           A single angiodysplastic lesion was found in the  ascending colon.                           Multiple medium-mouthed diverticula were found in                            the sigmoid colon.                           Internal hemorrhoids were found during                            retroflexion. The hemorrhoids were moderate.                           There was significant looping in the right colon                            which prohibited ileal intubation. The exam was                            otherwise without abnormality.                           Biopsies for histology were taken with a cold                            forceps from the right colon, left colon and                            transverse colon for evaluation of microscopic                            colitis. Complications:            No immediate complications. Estimated blood loss:                            Minimal. Estimated Blood Loss:     Estimated blood loss was minimal. Impression:               - Stool in the entire examined colon leading to                            several minutes spent lavaging to obtain adequate                            views.                           - Two 3 to 4 mm polyps in the ascending colon,                            removed with a cold snare. Resected and retrieved.                           - Two diminutive  polyps in the ascending colon,                            removed with a cold biopsy forceps. Resected and                            retrieved.                           - One 3 mm polyp in the transverse colon, removed                            with a cold snare.  Resected and retrieved.                           - One diminutive polyp in the sigmoid colon,                            removed with a cold biopsy forceps. Resected and                            retrieved.                           - A single colonic angiodysplastic lesion.                           - Diverticulosis in the sigmoid colon.                           - Internal hemorrhoids.                           - The examination was otherwise normal.                           - Biopsies were taken with a cold forceps from the                            right colon, left colon and transverse colon for                            evaluation of microscopic colitis. Recommendation:           - Patient has a contact number available for                            emergencies. The signs and symptoms of potential                            delayed complications were discussed with the                            patient. Return to normal activities tomorrow.  Written discharge instructions were provided to the                            patient.                           - Resume previous diet.                           - Continue present medications.                           - Await pathology results. Remo Lipps P. Armbruster, MD 09/14/2018 5:08:49 PM This report has been signed electronically.

## 2018-09-15 NOTE — Telephone Encounter (Signed)
Left message for follow up, will try again this afternoon.

## 2018-09-21 ENCOUNTER — Encounter: Payer: Self-pay | Admitting: Physician Assistant

## 2018-09-27 ENCOUNTER — Ambulatory Visit: Payer: Medicare HMO

## 2018-10-03 ENCOUNTER — Ambulatory Visit (INDEPENDENT_AMBULATORY_CARE_PROVIDER_SITE_OTHER): Payer: Medicare HMO | Admitting: Family Medicine

## 2018-10-03 ENCOUNTER — Encounter: Payer: Self-pay | Admitting: Family Medicine

## 2018-10-03 ENCOUNTER — Other Ambulatory Visit: Payer: Self-pay | Admitting: Family Medicine

## 2018-10-03 VITALS — BP 130/70 | HR 80 | Temp 98.3°F | Resp 16 | Ht 69.0 in | Wt 162.0 lb

## 2018-10-03 DIAGNOSIS — J329 Chronic sinusitis, unspecified: Secondary | ICD-10-CM | POA: Diagnosis not present

## 2018-10-03 DIAGNOSIS — J40 Bronchitis, not specified as acute or chronic: Secondary | ICD-10-CM | POA: Diagnosis not present

## 2018-10-03 MED ORDER — AZITHROMYCIN 250 MG PO TABS: ORAL_TABLET | ORAL | 0 refills | Status: DC

## 2018-10-03 NOTE — Progress Notes (Signed)
Subjective:    Patient ID: Andrew [name], male    DOB: 1940-10-28, 77 y.o.   MRN: [id number]  Patient presents today with a 2-week history of rhinorrhea, and cough.  He states that he is constantly filling handkerchiefs having to blow his nose.  He is occasionally blowing out bloody mucus.  He also reports a sinus headache.  Cough is gradually worsening.  Cough is productive of yellow mucus.  He reports head congestion as well as chest congestion.  He is concerned because several contacts of had pneumonia recently including his brother-in-law who recently died.  He is concerned that he may have pneumonia.  He denies any shortness of breath.  He denies any chest pain.  He denies any pleurisy.  He is requesting a pneumonia vaccine although he has had Pneumovax 23 as well as Prevnar 13. Past Medical History:  Diagnosis Date  . Allergy    grass etc.  . Barrett's esophagus   . BPH (benign prostatic hyperplasia)   . Chest pain, atypical   . Chronic kidney disease    kidney stones  . Coronary artery disease   . GERD (gastroesophageal reflux disease)    Barrett's  . Hernia   . Hyperlipidemia   . Hypertension   . Myocardial infarction ([place])   . Ulcer 1960   Past Surgical History:  Procedure Laterality Date  . CARDIAC CATHETERIZATION  02/14/2007   MITRAL REGURGITATION. LV NORMAL  . CHOLECYSTECTOMY    . COLONOSCOPY    . CORONARY ARTERY BYPASS GRAFT     twice  2 by passes each time  . INGUINAL HERNIA REPAIR     bilateral done twice  . LITHOTRIPSY    . UPPER GASTROINTESTINAL ENDOSCOPY     Current Outpatient Medications on File Prior to Visit  Medication Sig Dispense Refill  . aspirin 81 MG tablet Take 81 mg by mouth daily.      . Cholecalciferol (VITAMIN D) 2000 units CAPS Take 1 capsule by mouth daily.    . Dutasteride-Tamsulosin HCl (JALYN PO) Take by mouth daily. Takes this instead of Flomax - hasnt started Flomax yet    . lansoprazole (PREVACID) 30 MG capsule Take 30 mg by mouth  daily.     . metoprolol succinate (TOPROL-XL) 25 MG 24 hr tablet Take 1 tablet (25 mg total) by mouth daily. 90 tablet 3  . Multiple Vitamin (MULTIVITAMIN) tablet Take 1 tablet by mouth daily.      . nitroGLYCERIN (NITROSTAT) 0.4 MG SL tablet Place 1 tablet (0.4 mg total) under the tongue every 5 (five) minutes as needed. 50 tablet 4  . Omega-3 Fatty Acids (FISH OIL) 1000 MG CPDR Take 1,000 mg by mouth daily.      . Probiotic Product (ALIGN PO) Take by mouth daily.    . psyllium (METAMUCIL) 58.6 % powder Take 1 packet by mouth daily.    . simvastatin (ZOCOR) 10 MG tablet TAKE 1 TABLET AT BEDTIME 90 tablet 3  . tamsulosin (FLOMAX) 0.4 MG CAPS capsule Take 1 capsule (0.4 mg total) by mouth daily. Stop jalyn 30 capsule 3   Current Facility-Administered Medications on File Prior to Visit  Medication Dose Route Frequency Provider Last Rate Last Dose  . 0.9 %  sodium chloride infusion  500 mL Intravenous Once Armbruster, Carlota Raspberry, MD       Allergies  Allergen Reactions  . Imdur [Isosorbide Mononitrate]     Unknown, per pt   . Meloxicam  Facial swelling    Social History   Socioeconomic History  . Marital status: Married    Spouse name: Not on file  . Number of children: 2  . Years of education: Not on file  . Highest education level: Not on file  Occupational History  . Occupation: Office manager  Social Needs  . Financial resource strain: Not on file  . Food insecurity:    Worry: Not on file    Inability: Not on file  . Transportation needs:    Medical: Not on file    Non-medical: Not on file  Tobacco Use  . Smoking status: Never Smoker  . Smokeless tobacco: Never Used  Substance and Sexual Activity  . Alcohol use: No  . Drug use: No  . Sexual activity: Not on file  Lifestyle  . Physical activity:    Days per week: Not on file    Minutes per session: Not on file  . Stress: Not on file  Relationships  . Social connections:    Talks on phone: Not on file     Gets together: Not on file    Attends religious service: Not on file    Active member of club or organization: Not on file    Attends meetings of clubs or organizations: Not on file    Relationship status: Not on file  . Intimate partner violence:    Fear of current or ex partner: Not on file    Emotionally abused: Not on file    Physically abused: Not on file    Forced sexual activity: Not on file  Other Topics Concern  . Not on file  Social History Narrative  . Not on file    Past Medical History:  Diagnosis Date  . Allergy    grass etc.  . Barrett's esophagus   . BPH (benign prostatic hyperplasia)   . Chest pain, atypical   . Chronic kidney disease    kidney stones  . Coronary artery disease   . GERD (gastroesophageal reflux disease)    Barrett's  . Hernia   . Hyperlipidemia   . Hypertension   . Myocardial infarction ([place])   . Ulcer 1960   Past Surgical History:  Procedure Laterality Date  . CARDIAC CATHETERIZATION  02/14/2007   MITRAL REGURGITATION. LV NORMAL  . CHOLECYSTECTOMY    . COLONOSCOPY    . CORONARY ARTERY BYPASS GRAFT     twice  2 by passes each time  . INGUINAL HERNIA REPAIR     bilateral done twice  . LITHOTRIPSY    . UPPER GASTROINTESTINAL ENDOSCOPY     Current Outpatient Medications on File Prior to Visit  Medication Sig Dispense Refill  . aspirin 81 MG tablet Take 81 mg by mouth daily.      . Cholecalciferol (VITAMIN D) 2000 units CAPS Take 1 capsule by mouth daily.    . Dutasteride-Tamsulosin HCl (JALYN PO) Take by mouth daily. Takes this instead of Flomax - hasnt started Flomax yet    . lansoprazole (PREVACID) 30 MG capsule Take 30 mg by mouth daily.     . metoprolol succinate (TOPROL-XL) 25 MG 24 hr tablet Take 1 tablet (25 mg total) by mouth daily. 90 tablet 3  . Multiple Vitamin (MULTIVITAMIN) tablet Take 1 tablet by mouth daily.      . nitroGLYCERIN (NITROSTAT) 0.4 MG SL tablet Place 1 tablet (0.4 mg total) under the tongue every 5 (five)  minutes as needed. 50 tablet 4  .  Omega-3 Fatty Acids (FISH OIL) 1000 MG CPDR Take 1,000 mg by mouth daily.      . Probiotic Product (ALIGN PO) Take by mouth daily.    . psyllium (METAMUCIL) 58.6 % powder Take 1 packet by mouth daily.    . simvastatin (ZOCOR) 10 MG tablet TAKE 1 TABLET AT BEDTIME 90 tablet 3  . tamsulosin (FLOMAX) 0.4 MG CAPS capsule Take 1 capsule (0.4 mg total) by mouth daily. Stop jalyn 30 capsule 3   Current Facility-Administered Medications on File Prior to Visit  Medication Dose Route Frequency Provider Last Rate Last Dose  . 0.9 %  sodium chloride infusion  500 mL Intravenous Once Armbruster, Carlota Raspberry, MD       Allergies  Allergen Reactions  . Imdur [Isosorbide Mononitrate]     Unknown, per pt   . Meloxicam     Facial swelling    Social History   Socioeconomic History  . Marital status: Married    Spouse name: Not on file  . Number of children: 2  . Years of education: Not on file  . Highest education level: Not on file  Occupational History  . Occupation: Office manager  Social Needs  . Financial resource strain: Not on file  . Food insecurity:    Worry: Not on file    Inability: Not on file  . Transportation needs:    Medical: Not on file    Non-medical: Not on file  Tobacco Use  . Smoking status: Never Smoker  . Smokeless tobacco: Never Used  Substance and Sexual Activity  . Alcohol use: No  . Drug use: No  . Sexual activity: Not on file  Lifestyle  . Physical activity:    Days per week: Not on file    Minutes per session: Not on file  . Stress: Not on file  Relationships  . Social connections:    Talks on phone: Not on file    Gets together: Not on file    Attends religious service: Not on file    Active member of club or organization: Not on file    Attends meetings of clubs or organizations: Not on file    Relationship status: Not on file  . Intimate partner violence:    Fear of current or ex partner: Not on file     Emotionally abused: Not on file    Physically abused: Not on file    Forced sexual activity: Not on file  Other Topics Concern  . Not on file  Social History Narrative  . Not on file      Review of Systems  Gastrointestinal: Positive for diarrhea.  All other systems reviewed and are negative.      Objective:   Physical Exam Vitals signs reviewed.  Constitutional:      General: He is not in acute distress.    Appearance: Normal appearance. He is well-developed. He is not ill-appearing, toxic-appearing or diaphoretic.  HENT:     Right Ear: Tympanic membrane and ear canal normal.     Left Ear: Tympanic membrane and ear canal normal.     Nose: Congestion and rhinorrhea present.     Right Sinus: Frontal sinus tenderness present.     Left Sinus: Frontal sinus tenderness present.  Cardiovascular:     Rate and Rhythm: Normal rate and regular rhythm.     Heart sounds: Normal heart sounds.  Pulmonary:     Effort: Pulmonary effort is normal. No tachypnea, accessory muscle  usage or prolonged expiration.     Breath sounds: Normal breath sounds. No decreased air movement. No decreased breath sounds, wheezing, rhonchi or rales.  Abdominal:     General: Bowel sounds are normal. There is no distension.     Palpations: Abdomen is soft. There is no mass.     Tenderness: There is no abdominal tenderness. There is no guarding or rebound.  Neurological:     Mental Status: He is alert.           Assessment & Plan:  Sinusitis, Bronchitis I believe most likely the patient has a viral upper respiratory infection that has progressed to sinusitis and bronchitis.  Begin a Z-Pak.  Use Mucinex DM every 6 hours as directed.  Use Coricidin HBP for head congestion and rhinorrhea.  Recommended against him receiving another pneumonia vaccine at the current time given the fact his immunizations are up-to-date.  Patient also reports that he has been chronically hoarse now for 6 months.  I have no  recollection of him being hoarse although he is hoarse today on his exam.  He states that it is there more often than not.  He is on an acid reflux medication.  He is also taking medication for postnasal drainage.  However the hoarse voice persist.  And he is adamant that is been there for many months.  Therefore I recommended that he contact his ENT physician and schedule a follow-up as he may require repeat laryngoscopy.

## 2018-10-16 NOTE — Progress Notes (Signed)
Cardiology Office Note:    Date:  10/17/2018   ID:  Andrew [name], DOB 26-Mar-1941, MRN [id number]  PCP:  Susy Frizzle, MD  Cardiologist:  Mertie Moores, MD   Electrophysiologist:  None   Referring MD: Susy Frizzle, MD   Chief Complaint  Patient presents with  . Follow-up    CAD     History of Present Illness:    Andrew [name] is a 78 y.o. male with coronary artery disease s/p coronary artery bypass grafting in 1997 and redo coronary artery bypass grafting in 02/2007 (L radial-dist LAD, S-OM1), hyperlipidemia.   He was last seen by Dr. Acie Fredrickson in 7/19.     Mr. Marucci returns for routine cardiology follow-up.  He is here alone.  Since last seen, he has not had any significant shortness of breath, orthopnea, paroxysmal nocturnal dyspnea or lower extremity swelling.  He has occasional, brief chest pains.  However, he remains quite active without exertional chest discomfort or symptoms reminiscent of his previous angina.  Prior CV studies:   The following studies were reviewed today:  Carotid US 09/27/17 bilat ICA 1-39 Repeat 2 years  Echo 03/04/10 EF 55-60, mild LVH, mild BAE, mild aortic sclerosis, mild MAC, trace MR, trivial PI  Nuclear stress test 03/05/09 EF 64, no ischemia, normal study  Cardiac Catheterization 02/14/07 LM normal LAD prox 100; D2 90 LCx irregs; OM1 mid 90 L-LAD atretic S-D2 patent  Past Medical History:  Diagnosis Date  . Allergy    grass etc.  . Barrett's esophagus   . BPH (benign prostatic hyperplasia)   . Chest pain, atypical   . Chronic kidney disease    kidney stones  . Coronary artery disease   . GERD (gastroesophageal reflux disease)    Barrett's  . Hernia   . Hyperlipidemia   . Hypertension   . Myocardial infarction ([place])   . Ulcer 1960   Surgical Hx: The patient  has a past surgical history that includes Coronary artery bypass graft; Cholecystectomy; Colonoscopy; Upper gastrointestinal endoscopy; Inguinal hernia repair;  Lithotripsy; and Cardiac catheterization (02/14/2007).   Current Medications: Current Meds  Medication Sig  . aspirin 81 MG tablet Take 81 mg by mouth daily.    . Cholecalciferol (VITAMIN D) 2000 units CAPS Take 1 capsule by mouth daily.  . Dutasteride-Tamsulosin HCl (JALYN PO) Take by mouth daily. Takes this instead of Flomax - hasnt started Flomax yet  . lansoprazole (PREVACID) 30 MG capsule Take 30 mg by mouth daily.   . metoprolol succinate (TOPROL-XL) 25 MG 24 hr tablet Take 1 tablet (25 mg total) by mouth daily.  . Multiple Vitamin (MULTIVITAMIN) tablet Take 1 tablet by mouth daily.    . nitroGLYCERIN (NITROSTAT) 0.4 MG SL tablet Place 1 tablet (0.4 mg total) under the tongue every 5 (five) minutes as needed.  . Omega-3 Fatty Acids (FISH OIL) 1000 MG CPDR Take 1,000 mg by mouth daily.    . simvastatin (ZOCOR) 10 MG tablet TAKE 1 TABLET AT BEDTIME   Current Facility-Administered Medications for the 10/17/18 encounter (Office Visit) with Richardson Dopp T, PA-C  Medication  . 0.9 %  sodium chloride infusion     Allergies:   Imdur [isosorbide mononitrate] and Meloxicam   Social History   Tobacco Use  . Smoking status: Never Smoker  . Smokeless tobacco: Never Used  Substance Use Topics  . Alcohol use: No  . Drug use: No     Family Hx: The patient's family history includes  Heart attack in his mother; Heart disease in his father. There is no history of Colon cancer, Esophageal cancer, Rectal cancer, or Stomach cancer.  ROS:   Please see the history of present illness.    ROS All other systems reviewed and are negative.   EKGs/Labs/Other Test Reviewed:    EKG:  EKG is  ordered today.  The ekg ordered today demonstrates normal sinus rhythm, heart rate 67, normal axis, RSR prime in V1, QTC 416, no change from prior tracing  Recent Labs: 07/15/2018: ALT 11; BUN 19; Creat 0.92; Hemoglobin 13.8; Platelets 96; Potassium 4.6; Sodium 139; TSH 2.14   Recent Lipid Panel Lab Results    Component Value Date/Time   CHOL 99 07/15/2018 08:48 AM   TRIG 85 07/15/2018 08:48 AM   HDL 34 (L) 07/15/2018 08:48 AM   CHOLHDL 2.9 07/15/2018 08:48 AM   LDLCALC 48 07/15/2018 08:48 AM    Physical Exam:    VS:  BP 100/62   Pulse 67   Ht 5' 9"  (1.753 m)   Wt 163 lb 6.4 oz (74.1 kg)   SpO2 98%   BMI 24.13 kg/m     Wt Readings from Last 3 Encounters:  10/17/18 163 lb 6.4 oz (74.1 kg)  10/03/18 162 lb (73.5 kg)  09/14/18 163 lb (73.9 kg)     Physical Exam  Constitutional: He is oriented to person, place, and time. He appears well-developed and well-nourished. No distress.  HENT:  Head: Normocephalic and atraumatic.  Neck: Neck supple. No JVD present.  Cardiovascular: Normal rate, regular rhythm, S1 normal and S2 normal.  No murmur heard. Pulmonary/Chest: Breath sounds normal. He has no rales.  Abdominal: Soft. There is no hepatomegaly.  Musculoskeletal:        General: No edema.  Neurological: He is alert and oriented to person, place, and time.  Skin: Skin is warm and dry.    ASSESSMENT & PLAN:    Coronary artery disease involving native coronary artery of native heart without angina pectoris  History of CABG in 1997 and redo bypass in May 2008.  Nuclear stress test in 2010 was low risk and negative for ischemia.  He is doing well without anginal symptoms.  He has some occasional atypical chest pains.  ECG is unchanged.  Continue current management with aspirin, metoprolol, simvastatin.  Bilateral carotid artery disease, unspecified type (HCC) Continue aspirin, statin.  Follow-up carotid Dopplers are due in December 2020.  Hyperlipidemia, unspecified hyperlipidemia type LDL optimal on most recent lab work.  Continue current Rx.     Dispo:  Return in about 6 months (around 04/17/2019) for Routine Follow Up, w/ Dr. Acie Fredrickson.   Medication Adjustments/Labs and Tests Ordered: Current medicines are reviewed at length with the patient today.  Concerns regarding medicines  are outlined above.  Tests Ordered: Orders Placed This Encounter  Procedures  . EKG 12-Lead   Medication Changes: Meds ordered this encounter  Medications  . metoprolol succinate (TOPROL-XL) 25 MG 24 hr tablet    Sig: Take 1 tablet (25 mg total) by mouth daily.    Dispense:  90 tablet    Refill:  3    Order Specific Question:   Supervising Provider    Answer:   Josue Hector [[id number]]    Signed, Richardson Dopp, PA-C  10/17/2018 1:19 PM    [facility] [address], [place], [place]  [address] Phone: [phone]; Fax: [phone]

## 2018-10-17 ENCOUNTER — Encounter: Payer: Self-pay | Admitting: Physician Assistant

## 2018-10-17 ENCOUNTER — Encounter (INDEPENDENT_AMBULATORY_CARE_PROVIDER_SITE_OTHER): Payer: Self-pay

## 2018-10-17 ENCOUNTER — Ambulatory Visit (INDEPENDENT_AMBULATORY_CARE_PROVIDER_SITE_OTHER): Payer: Medicare HMO | Admitting: Physician Assistant

## 2018-10-17 VITALS — BP 100/62 | HR 67 | Ht 69.0 in | Wt 163.4 lb

## 2018-10-17 DIAGNOSIS — E785 Hyperlipidemia, unspecified: Secondary | ICD-10-CM | POA: Diagnosis not present

## 2018-10-17 DIAGNOSIS — I739 Peripheral vascular disease, unspecified: Secondary | ICD-10-CM

## 2018-10-17 DIAGNOSIS — I779 Disorder of arteries and arterioles, unspecified: Secondary | ICD-10-CM

## 2018-10-17 DIAGNOSIS — I2510 Atherosclerotic heart disease of native coronary artery without angina pectoris: Secondary | ICD-10-CM | POA: Diagnosis not present

## 2018-10-17 MED ORDER — METOPROLOL SUCCINATE ER 25 MG PO TB24: 25.0000 mg | ORAL_TABLET | Freq: Every day | ORAL | 3 refills | Status: DC

## 2018-10-17 NOTE — Patient Instructions (Addendum)
Medication Instructions:  Your physician recommends that you continue on your current medications as directed. Please refer to the Current Medication list given to you today.  If you need a refill on your cardiac medications before your next appointment, please call your pharmacy.   Lab work: None Ordered  If you have labs (blood work) drawn today and your tests are completely normal, you will receive your results only by: [name] MyChart Message (if you have MyChart) OR . A paper copy in the mail If you have any lab test that is abnormal or we need to change your treatment, we will call you to review the results.  Testing/Procedures: None ordered  Follow-Up: At [facility], you and your health needs are our priority.  As part of our continuing mission to provide you with exceptional heart care, we have created designated Provider Care Teams.  These Care Teams include your primary Cardiologist (physician) and Advanced Practice Providers (APPs -  Physician Assistants and Nurse Practitioners) who all work together to provide you with the care you need, when you need it. . You will need a follow up appointment in 6 months.  Please call our office 2 months in advance to schedule this appointment.  You may see Grayland Jack, MD or one of the following Advanced Practice Providers on your designated Care Team:    Any Other Special Instructions Will Be Listed Below (If Applicable).

## 2018-10-18 ENCOUNTER — Other Ambulatory Visit: Payer: Self-pay | Admitting: *Deleted

## 2018-10-18 MED ORDER — LANSOPRAZOLE 30 MG PO CPDR: 30.0000 mg | DELAYED_RELEASE_CAPSULE | Freq: Every day | ORAL | 3 refills | Status: DC

## 2018-10-18 MED ORDER — SIMVASTATIN 10 MG PO TABS: 10.0000 mg | ORAL_TABLET | Freq: Every day | ORAL | 3 refills | Status: DC

## 2018-10-18 MED ORDER — METOPROLOL SUCCINATE ER 25 MG PO TB24: 25.0000 mg | ORAL_TABLET | Freq: Every day | ORAL | 3 refills | Status: DC

## 2018-11-03 DIAGNOSIS — L570 Actinic keratosis: Secondary | ICD-10-CM | POA: Diagnosis not present

## 2018-11-03 DIAGNOSIS — D225 Melanocytic nevi of trunk: Secondary | ICD-10-CM | POA: Diagnosis not present

## 2018-11-03 DIAGNOSIS — X32XXXD Exposure to sunlight, subsequent encounter: Secondary | ICD-10-CM | POA: Diagnosis not present

## 2018-11-03 DIAGNOSIS — L821 Other seborrheic keratosis: Secondary | ICD-10-CM | POA: Diagnosis not present

## 2018-12-20 ENCOUNTER — Encounter: Payer: Self-pay | Admitting: Family Medicine

## 2018-12-20 ENCOUNTER — Other Ambulatory Visit: Payer: Self-pay

## 2018-12-20 ENCOUNTER — Ambulatory Visit (INDEPENDENT_AMBULATORY_CARE_PROVIDER_SITE_OTHER): Payer: Medicare HMO | Admitting: Family Medicine

## 2018-12-20 VITALS — BP 130/70 | HR 80 | Temp 98.3°F | Resp 16 | Ht 69.0 in | Wt 168.0 lb

## 2018-12-20 DIAGNOSIS — H6122 Impacted cerumen, left ear: Secondary | ICD-10-CM | POA: Diagnosis not present

## 2018-12-20 DIAGNOSIS — J00 Acute nasopharyngitis [common cold]: Secondary | ICD-10-CM

## 2018-12-20 MED ORDER — FINASTERIDE 5 MG PO TABS: 5.0000 mg | ORAL_TABLET | Freq: Every day | ORAL | 1 refills | Status: DC

## 2018-12-20 MED ORDER — TAMSULOSIN HCL 0.4 MG PO CAPS: 0.4000 mg | ORAL_CAPSULE | Freq: Every day | ORAL | 3 refills | Status: DC

## 2018-12-20 MED ORDER — LEVOCETIRIZINE DIHYDROCHLORIDE 5 MG PO TABS: 5.0000 mg | ORAL_TABLET | Freq: Every evening | ORAL | 0 refills | Status: DC

## 2018-12-20 NOTE — Progress Notes (Signed)
Subjective:    Patient ID: Andrew [name], male    DOB: 01/29/1941, 78 y.o.   MRN: [id number]  Symptoms began less than a week ago.  Symptoms consist of clear rhinorrhea.  He also has a mild headache in his forehead.  He denies any fevers or chills.  He denies any sinus pain.  He denies any sore throat.  He denies any cough.  He denies any chest pain or shortness of breath.  He denies any nausea or vomiting. Past Medical History:  Diagnosis Date  . Allergy    grass etc.  . Barrett's esophagus   . BPH (benign prostatic hyperplasia)   . Chest pain, atypical   . Chronic kidney disease    kidney stones  . Coronary artery disease   . GERD (gastroesophageal reflux disease)    Barrett's  . Hernia   . Hyperlipidemia   . Hypertension   . Myocardial infarction ([place])   . Ulcer 1960   Past Surgical History:  Procedure Laterality Date  . CARDIAC CATHETERIZATION  02/14/2007   MITRAL REGURGITATION. LV NORMAL  . CHOLECYSTECTOMY    . COLONOSCOPY    . CORONARY ARTERY BYPASS GRAFT     twice  2 by passes each time  . INGUINAL HERNIA REPAIR     bilateral done twice  . LITHOTRIPSY    . UPPER GASTROINTESTINAL ENDOSCOPY     Current Outpatient Medications on File Prior to Visit  Medication Sig Dispense Refill  . aspirin 81 MG tablet Take 81 mg by mouth daily.      . Cholecalciferol (VITAMIN D) 2000 units CAPS Take 1 capsule by mouth daily.    . Dutasteride-Tamsulosin HCl (JALYN PO) Take by mouth daily. Takes this instead of Flomax - hasnt started Flomax yet    . lansoprazole (PREVACID) 30 MG capsule Take 1 capsule (30 mg total) by mouth daily. 90 capsule 3  . metoprolol succinate (TOPROL-XL) 25 MG 24 hr tablet Take 1 tablet (25 mg total) by mouth daily. 90 tablet 3  . Multiple Vitamin (MULTIVITAMIN) tablet Take 1 tablet by mouth daily.      . nitroGLYCERIN (NITROSTAT) 0.4 MG SL tablet Place 1 tablet (0.4 mg total) under the tongue every 5 (five) minutes as needed. 50 tablet 4  . Omega-3 Fatty  Acids (FISH OIL) 1000 MG CPDR Take 1,000 mg by mouth daily.      . simvastatin (ZOCOR) 10 MG tablet Take 1 tablet (10 mg total) by mouth at bedtime. 90 tablet 3   Current Facility-Administered Medications on File Prior to Visit  Medication Dose Route Frequency Provider Last Rate Last Dose  . 0.9 %  sodium chloride infusion  500 mL Intravenous Once Armbruster, Carlota Raspberry, MD       Allergies  Allergen Reactions  . Imdur [Isosorbide Mononitrate]     Unknown, per pt   . Meloxicam     Facial swelling    Social History   Socioeconomic History  . Marital status: Married    Spouse name: Not on file  . Number of children: 2  . Years of education: Not on file  . Highest education level: Not on file  Occupational History  . Occupation: Office manager  Social Needs  . Financial resource strain: Not on file  . Food insecurity:    Worry: Not on file    Inability: Not on file  . Transportation needs:    Medical: Not on file    Non-medical: Not on  file  Tobacco Use  . Smoking status: Never Smoker  . Smokeless tobacco: Never Used  Substance and Sexual Activity  . Alcohol use: No  . Drug use: No  . Sexual activity: Not on file  Lifestyle  . Physical activity:    Days per week: Not on file    Minutes per session: Not on file  . Stress: Not on file  Relationships  . Social connections:    Talks on phone: Not on file    Gets together: Not on file    Attends religious service: Not on file    Active member of club or organization: Not on file    Attends meetings of clubs or organizations: Not on file    Relationship status: Not on file  . Intimate partner violence:    Fear of current or ex partner: Not on file    Emotionally abused: Not on file    Physically abused: Not on file    Forced sexual activity: Not on file  Other Topics Concern  . Not on file  Social History Narrative  . Not on file    Past Medical History:  Diagnosis Date  . Allergy    grass etc.  .  Barrett's esophagus   . BPH (benign prostatic hyperplasia)   . Chest pain, atypical   . Chronic kidney disease    kidney stones  . Coronary artery disease   . GERD (gastroesophageal reflux disease)    Barrett's  . Hernia   . Hyperlipidemia   . Hypertension   . Myocardial infarction ([place])   . Ulcer 1960   Past Surgical History:  Procedure Laterality Date  . CARDIAC CATHETERIZATION  02/14/2007   MITRAL REGURGITATION. LV NORMAL  . CHOLECYSTECTOMY    . COLONOSCOPY    . CORONARY ARTERY BYPASS GRAFT     twice  2 by passes each time  . INGUINAL HERNIA REPAIR     bilateral done twice  . LITHOTRIPSY    . UPPER GASTROINTESTINAL ENDOSCOPY     Current Outpatient Medications on File Prior to Visit  Medication Sig Dispense Refill  . aspirin 81 MG tablet Take 81 mg by mouth daily.      . Cholecalciferol (VITAMIN D) 2000 units CAPS Take 1 capsule by mouth daily.    . Dutasteride-Tamsulosin HCl (JALYN PO) Take by mouth daily. Takes this instead of Flomax - hasnt started Flomax yet    . lansoprazole (PREVACID) 30 MG capsule Take 1 capsule (30 mg total) by mouth daily. 90 capsule 3  . metoprolol succinate (TOPROL-XL) 25 MG 24 hr tablet Take 1 tablet (25 mg total) by mouth daily. 90 tablet 3  . Multiple Vitamin (MULTIVITAMIN) tablet Take 1 tablet by mouth daily.      . nitroGLYCERIN (NITROSTAT) 0.4 MG SL tablet Place 1 tablet (0.4 mg total) under the tongue every 5 (five) minutes as needed. 50 tablet 4  . Omega-3 Fatty Acids (FISH OIL) 1000 MG CPDR Take 1,000 mg by mouth daily.      . simvastatin (ZOCOR) 10 MG tablet Take 1 tablet (10 mg total) by mouth at bedtime. 90 tablet 3   Current Facility-Administered Medications on File Prior to Visit  Medication Dose Route Frequency Provider Last Rate Last Dose  . 0.9 %  sodium chloride infusion  500 mL Intravenous Once Armbruster, Carlota Raspberry, MD       Allergies  Allergen Reactions  . Imdur [Isosorbide Mononitrate]     Unknown, per pt   .  Meloxicam       Facial swelling    Social History   Socioeconomic History  . Marital status: Married    Spouse name: Not on file  . Number of children: 2  . Years of education: Not on file  . Highest education level: Not on file  Occupational History  . Occupation: Office manager  Social Needs  . Financial resource strain: Not on file  . Food insecurity:    Worry: Not on file    Inability: Not on file  . Transportation needs:    Medical: Not on file    Non-medical: Not on file  Tobacco Use  . Smoking status: Never Smoker  . Smokeless tobacco: Never Used  Substance and Sexual Activity  . Alcohol use: No  . Drug use: No  . Sexual activity: Not on file  Lifestyle  . Physical activity:    Days per week: Not on file    Minutes per session: Not on file  . Stress: Not on file  Relationships  . Social connections:    Talks on phone: Not on file    Gets together: Not on file    Attends religious service: Not on file    Active member of club or organization: Not on file    Attends meetings of clubs or organizations: Not on file    Relationship status: Not on file  . Intimate partner violence:    Fear of current or ex partner: Not on file    Emotionally abused: Not on file    Physically abused: Not on file    Forced sexual activity: Not on file  Other Topics Concern  . Not on file  Social History Narrative  . Not on file      Review of Systems  Gastrointestinal: Positive for diarrhea.  All other systems reviewed and are negative.      Objective:   Physical Exam Vitals signs reviewed.  Constitutional:      General: He is not in acute distress.    Appearance: Normal appearance. He is well-developed. He is not ill-appearing, toxic-appearing or diaphoretic.  HENT:     Right Ear: Tympanic membrane and ear canal normal.     Left Ear: Tympanic membrane and ear canal normal.     Nose: Congestion and rhinorrhea present.     Right Sinus: Frontal sinus tenderness present.      Left Sinus: Frontal sinus tenderness present.  Cardiovascular:     Rate and Rhythm: Normal rate and regular rhythm.     Heart sounds: Normal heart sounds.  Pulmonary:     Effort: Pulmonary effort is normal. No tachypnea, accessory muscle usage or prolonged expiration.     Breath sounds: Normal breath sounds. No decreased air movement. No decreased breath sounds, wheezing, rhonchi or rales.  Abdominal:     General: Bowel sounds are normal. There is no distension.     Palpations: Abdomen is soft. There is no mass.     Tenderness: There is no abdominal tenderness. There is no guarding or rebound.  Neurological:     Mental Status: He is alert.           Assessment & Plan:  Patient most likely has rhinitis either due to an upper respiratory infection or perhaps early allergies.  I recommended Xyzal 5 mg a day with Flonase 2 sprays each nostril daily.  He can also use Afrin for the next 3 days to try to combat the rhinorrhea.  Recheck if no better in 1 week or if he develops fevers or chills.  He also has a cerumen impaction in his left ear that was easily removed with irrigation and lavage.  He also complains of some mild pain in his lateral left hamstring.  This is no longer hurting and it stopped hurting 3 days ago.  He was concerned that he had a blood clot.  However there is no swelling in his left leg at all.  I provided reassurance that I believe he just simply pulled his hamstring

## 2018-12-28 ENCOUNTER — Telehealth: Payer: Self-pay | Admitting: Family Medicine

## 2018-12-28 NOTE — Telephone Encounter (Signed)
Patient requesting call back from you, only you and would not specify a reason  [phone]

## 2018-12-29 ENCOUNTER — Other Ambulatory Visit: Payer: Self-pay

## 2018-12-29 ENCOUNTER — Ambulatory Visit (INDEPENDENT_AMBULATORY_CARE_PROVIDER_SITE_OTHER): Payer: Medicare HMO | Admitting: Family Medicine

## 2018-12-29 ENCOUNTER — Encounter: Payer: Self-pay | Admitting: Family Medicine

## 2018-12-29 VITALS — BP 150/90 | HR 76 | Temp 98.1°F | Resp 16 | Ht 69.0 in | Wt 168.0 lb

## 2018-12-29 DIAGNOSIS — J00 Acute nasopharyngitis [common cold]: Secondary | ICD-10-CM

## 2018-12-29 MED ORDER — PREDNISONE 20 MG PO TABS: ORAL_TABLET | ORAL | 0 refills | Status: DC

## 2018-12-29 NOTE — Progress Notes (Signed)
Subjective:    Patient ID: Andrew [name], male    DOB: 05/16/1941, 78 y.o.   MRN: [id number]  Patient presents today continuing to complain of rhinorrhea.  He is concerned because what he is blowing out is now a thick yellow color.  He denies any sinus pain or sinus pressure.  He denies any fevers or chills or sinus headache.  He does occasionally have a mild cough but is nonproductive.  He is concerned by the color of the drainage.  He has been taking Afrin, Flonase, and Xyzal along with Mucinex with no relief in his symptoms Past Medical History:  Diagnosis Date  . Allergy    grass etc.  . Barrett's esophagus   . BPH (benign prostatic hyperplasia)   . Chest pain, atypical   . Chronic kidney disease    kidney stones  . Coronary artery disease   . GERD (gastroesophageal reflux disease)    Barrett's  . Hernia   . Hyperlipidemia   . Hypertension   . Myocardial infarction ([place])   . Ulcer 1960   Past Surgical History:  Procedure Laterality Date  . CARDIAC CATHETERIZATION  02/14/2007   MITRAL REGURGITATION. LV NORMAL  . CHOLECYSTECTOMY    . COLONOSCOPY    . CORONARY ARTERY BYPASS GRAFT     twice  2 by passes each time  . INGUINAL HERNIA REPAIR     bilateral done twice  . LITHOTRIPSY    . UPPER GASTROINTESTINAL ENDOSCOPY     Current Outpatient Medications on File Prior to Visit  Medication Sig Dispense Refill  . aspirin 81 MG tablet Take 81 mg by mouth daily.      . Cholecalciferol (VITAMIN D) 2000 units CAPS Take 1 capsule by mouth daily.    . Dutasteride-Tamsulosin HCl (JALYN PO) Take by mouth daily. Takes this instead of Flomax - hasnt started Flomax yet    . finasteride (PROSCAR) 5 MG tablet Take 1 tablet (5 mg total) by mouth daily. Stop avodart 30 tablet 1  . fluticasone (FLONASE) 50 MCG/ACT nasal spray Place 2 sprays into both nostrils 2 (two) times daily.    . lansoprazole (PREVACID) 30 MG capsule Take 1 capsule (30 mg total) by mouth daily. 90 capsule 3  .  levocetirizine (XYZAL) 5 MG tablet Take 1 tablet (5 mg total) by mouth every evening. 30 tablet 0  . metoprolol succinate (TOPROL-XL) 25 MG 24 hr tablet Take 1 tablet (25 mg total) by mouth daily. 90 tablet 3  . Multiple Vitamin (MULTIVITAMIN) tablet Take 1 tablet by mouth daily.      . nitroGLYCERIN (NITROSTAT) 0.4 MG SL tablet Place 1 tablet (0.4 mg total) under the tongue every 5 (five) minutes as needed. 50 tablet 4  . Omega-3 Fatty Acids (FISH OIL) 1000 MG CPDR Take 1,000 mg by mouth daily.      . simvastatin (ZOCOR) 10 MG tablet Take 1 tablet (10 mg total) by mouth at bedtime. 90 tablet 3  . tamsulosin (FLOMAX) 0.4 MG CAPS capsule Take 1 capsule (0.4 mg total) by mouth daily. 30 capsule 3   Current Facility-Administered Medications on File Prior to Visit  Medication Dose Route Frequency Provider Last Rate Last Dose  . 0.9 %  sodium chloride infusion  500 mL Intravenous Once Armbruster, Carlota Raspberry, MD       Allergies  Allergen Reactions  . Imdur [Isosorbide Mononitrate]     Unknown, per pt   . Meloxicam     Facial swelling  Social History   Socioeconomic History  . Marital status: Married    Spouse name: Not on file  . Number of children: 2  . Years of education: Not on file  . Highest education level: Not on file  Occupational History  . Occupation: Office manager  Social Needs  . Financial resource strain: Not on file  . Food insecurity:    Worry: Not on file    Inability: Not on file  . Transportation needs:    Medical: Not on file    Non-medical: Not on file  Tobacco Use  . Smoking status: Never Smoker  . Smokeless tobacco: Never Used  Substance and Sexual Activity  . Alcohol use: No  . Drug use: No  . Sexual activity: Not on file  Lifestyle  . Physical activity:    Days per week: Not on file    Minutes per session: Not on file  . Stress: Not on file  Relationships  . Social connections:    Talks on phone: Not on file    Gets together: Not on  file    Attends religious service: Not on file    Active member of club or organization: Not on file    Attends meetings of clubs or organizations: Not on file    Relationship status: Not on file  . Intimate partner violence:    Fear of current or ex partner: Not on file    Emotionally abused: Not on file    Physically abused: Not on file    Forced sexual activity: Not on file  Other Topics Concern  . Not on file  Social History Narrative  . Not on file    Past Medical History:  Diagnosis Date  . Allergy    grass etc.  . Barrett's esophagus   . BPH (benign prostatic hyperplasia)   . Chest pain, atypical   . Chronic kidney disease    kidney stones  . Coronary artery disease   . GERD (gastroesophageal reflux disease)    Barrett's  . Hernia   . Hyperlipidemia   . Hypertension   . Myocardial infarction ([place])   . Ulcer 1960   Past Surgical History:  Procedure Laterality Date  . CARDIAC CATHETERIZATION  02/14/2007   MITRAL REGURGITATION. LV NORMAL  . CHOLECYSTECTOMY    . COLONOSCOPY    . CORONARY ARTERY BYPASS GRAFT     twice  2 by passes each time  . INGUINAL HERNIA REPAIR     bilateral done twice  . LITHOTRIPSY    . UPPER GASTROINTESTINAL ENDOSCOPY     Current Outpatient Medications on File Prior to Visit  Medication Sig Dispense Refill  . aspirin 81 MG tablet Take 81 mg by mouth daily.      . Cholecalciferol (VITAMIN D) 2000 units CAPS Take 1 capsule by mouth daily.    . Dutasteride-Tamsulosin HCl (JALYN PO) Take by mouth daily. Takes this instead of Flomax - hasnt started Flomax yet    . finasteride (PROSCAR) 5 MG tablet Take 1 tablet (5 mg total) by mouth daily. Stop avodart 30 tablet 1  . fluticasone (FLONASE) 50 MCG/ACT nasal spray Place 2 sprays into both nostrils 2 (two) times daily.    . lansoprazole (PREVACID) 30 MG capsule Take 1 capsule (30 mg total) by mouth daily. 90 capsule 3  . levocetirizine (XYZAL) 5 MG tablet Take 1 tablet (5 mg total) by mouth every  evening. 30 tablet 0  . metoprolol succinate (TOPROL-XL) 25  MG 24 hr tablet Take 1 tablet (25 mg total) by mouth daily. 90 tablet 3  . Multiple Vitamin (MULTIVITAMIN) tablet Take 1 tablet by mouth daily.      . nitroGLYCERIN (NITROSTAT) 0.4 MG SL tablet Place 1 tablet (0.4 mg total) under the tongue every 5 (five) minutes as needed. 50 tablet 4  . Omega-3 Fatty Acids (FISH OIL) 1000 MG CPDR Take 1,000 mg by mouth daily.      . simvastatin (ZOCOR) 10 MG tablet Take 1 tablet (10 mg total) by mouth at bedtime. 90 tablet 3  . tamsulosin (FLOMAX) 0.4 MG CAPS capsule Take 1 capsule (0.4 mg total) by mouth daily. 30 capsule 3   Current Facility-Administered Medications on File Prior to Visit  Medication Dose Route Frequency Provider Last Rate Last Dose  . 0.9 %  sodium chloride infusion  500 mL Intravenous Once Armbruster, Carlota Raspberry, MD       Allergies  Allergen Reactions  . Imdur [Isosorbide Mononitrate]     Unknown, per pt   . Meloxicam     Facial swelling    Social History   Socioeconomic History  . Marital status: Married    Spouse name: Not on file  . Number of children: 2  . Years of education: Not on file  . Highest education level: Not on file  Occupational History  . Occupation: Office manager  Social Needs  . Financial resource strain: Not on file  . Food insecurity:    Worry: Not on file    Inability: Not on file  . Transportation needs:    Medical: Not on file    Non-medical: Not on file  Tobacco Use  . Smoking status: Never Smoker  . Smokeless tobacco: Never Used  Substance and Sexual Activity  . Alcohol use: No  . Drug use: No  . Sexual activity: Not on file  Lifestyle  . Physical activity:    Days per week: Not on file    Minutes per session: Not on file  . Stress: Not on file  Relationships  . Social connections:    Talks on phone: Not on file    Gets together: Not on file    Attends religious service: Not on file    Active member of club or  organization: Not on file    Attends meetings of clubs or organizations: Not on file    Relationship status: Not on file  . Intimate partner violence:    Fear of current or ex partner: Not on file    Emotionally abused: Not on file    Physically abused: Not on file    Forced sexual activity: Not on file  Other Topics Concern  . Not on file  Social History Narrative  . Not on file      Review of Systems  Gastrointestinal: Positive for diarrhea.  All other systems reviewed and are negative.      Objective:   Physical Exam Vitals signs reviewed.  Constitutional:      General: He is not in acute distress.    Appearance: Normal appearance. He is well-developed. He is not ill-appearing, toxic-appearing or diaphoretic.  HENT:     Right Ear: Tympanic membrane and ear canal normal.     Left Ear: Tympanic membrane and ear canal normal.     Nose: Congestion and rhinorrhea present.     Right Sinus: Frontal sinus tenderness present.     Left Sinus: Frontal sinus tenderness present.  Cardiovascular:  Rate and Rhythm: Normal rate and regular rhythm.     Heart sounds: Normal heart sounds.  Pulmonary:     Effort: Pulmonary effort is normal. No tachypnea, accessory muscle usage or prolonged expiration.     Breath sounds: Normal breath sounds. No decreased air movement. No decreased breath sounds, wheezing, rhonchi or rales.  Abdominal:     General: Bowel sounds are normal. There is no distension.     Palpations: Abdomen is soft. There is no mass.     Tenderness: There is no abdominal tenderness. There is no guarding or rebound.  Neurological:     Mental Status: He is alert.           Assessment & Plan:  I believe the patient's symptoms are likely due either to a viral upper respiratory infection or more likely allergies.  He does not appear clinically ill.  I tried to reassure the patient that the color of the mucus does not indicate an infection.  His primary concern includes  head congestion and rhinorrhea despite being on Xyzal and Flonase.  Therefore I will start the patient on a prednisone taper pack as I suspect this is most likely allergies.  If his symptoms improve after the prednisone, we could consider adding Singulair to his medication regimen to help control his allergies moving forward.  Tried to provide reassurance as the patient clinically appears well

## 2018-12-29 NOTE — Telephone Encounter (Signed)
Pt seen in ov

## 2019-01-03 ENCOUNTER — Encounter: Payer: Self-pay | Admitting: Family Medicine

## 2019-01-03 ENCOUNTER — Other Ambulatory Visit: Payer: Self-pay

## 2019-01-03 ENCOUNTER — Ambulatory Visit (INDEPENDENT_AMBULATORY_CARE_PROVIDER_SITE_OTHER): Payer: Medicare HMO | Admitting: Family Medicine

## 2019-01-03 ENCOUNTER — Ambulatory Visit
Admission: RE | Admit: 2019-01-03 | Discharge: 2019-01-03 | Disposition: A | Discharge status: Home / Self Care | Payer: Medicare HMO | Source: Ambulatory Visit | Attending: Family Medicine | Admitting: Family Medicine

## 2019-01-03 VITALS — BP 130/68 | HR 86 | Temp 98.4°F | Resp 16 | Ht 69.0 in | Wt 167.0 lb

## 2019-01-03 DIAGNOSIS — R1030 Lower abdominal pain, unspecified: Secondary | ICD-10-CM

## 2019-01-03 DIAGNOSIS — R7303 Prediabetes: Secondary | ICD-10-CM | POA: Diagnosis not present

## 2019-01-03 DIAGNOSIS — Z125 Encounter for screening for malignant neoplasm of prostate: Secondary | ICD-10-CM | POA: Diagnosis not present

## 2019-01-03 DIAGNOSIS — R109 Unspecified abdominal pain: Secondary | ICD-10-CM | POA: Diagnosis not present

## 2019-01-03 LAB — URINALYSIS, ROUTINE W REFLEX MICROSCOPIC
Bacteria, UA: NONE SEEN /HPF
Bilirubin Urine: NEGATIVE
Glucose, UA: NEGATIVE
Hyaline Cast: NONE SEEN /LPF
KETONES UR: NEGATIVE
Leukocytes,Ua: NEGATIVE
Nitrite: NEGATIVE
Protein, ur: NEGATIVE
Specific Gravity, Urine: 1.025 (ref 1.001–1.03)
Squamous Epithelial / HPF: NONE SEEN /HPF (ref <=5)
WBC, UA: NONE SEEN /HPF (ref 0–5)
pH: 6 (ref 5.0–8.0)

## 2019-01-03 LAB — MICROSCOPIC MESSAGE

## 2019-01-03 MED ORDER — DICYCLOMINE HCL 20 MG PO TABS: 20.0000 mg | ORAL_TABLET | Freq: Four times a day (QID) | ORAL | 1 refills | Status: DC | PRN

## 2019-01-03 NOTE — Progress Notes (Signed)
Subjective:    Patient ID: Andrew [name], male    DOB: [birth year], 78 y.o.   MRN: [id number]  12/29/18 Patient presents today continuing to complain of rhinorrhea.  He is concerned because what he is blowing out is now a thick yellow color.  He denies any sinus pain or sinus pressure.  He denies any fevers or chills or sinus headache.  He does occasionally have a mild cough but is nonproductive.  He is concerned by the color of the drainage.  He has been taking Afrin, Flonase, and Xyzal along with Mucinex with no relief in his symptoms.  At that time, my plan was: I believe the patient's symptoms are likely due either to a viral upper respiratory infection or more likely allergies.  He does not appear clinically ill.  I tried to reassure the patient that the color of the mucus does not indicate an infection.  His primary concern includes head congestion and rhinorrhea despite being on Xyzal and Flonase.  Therefore I will start the patient on a prednisone taper pack as I suspect this is most likely allergies.  If his symptoms improve after the prednisone, we could consider adding Singulair to his medication regimen to help control his allergies moving forward.  Tried to provide reassurance as the patient clinically appears well  01/03/19 Patient states that yesterday, he developed sudden onset of lower abdominal pain.  Pain was located below the umbilicus and radiated into his back.  The pain is located in the suprapubic area as well.  The pain would come and go in waves but was persistent throughout the day yesterday.  It tended to be worse if he was standing and better if he was sitting.  He denied any dysuria or hematuria or urgency or frequency.  He denied any constipation yesterday.  Patient states that he was having normal bowel movements without any melena or hematochezia.  He denies any fevers or chills.  Today on his examination, the pain has subsided.  He felt some mild discomfort this morning  however the pain is now gone.  He has no CVA tenderness.  His abdomen is soft nondistended nontender with normal bowel sounds.  There is no tenderness to palpation of the right upper quadrant.  The patient has a history of a cholecystectomy.  There is no tenderness to palpation of the left upper quadrant.  The patient states that the area where he felt the most pain was in the suprapubic region below his umbilicus however at the present time that area is soft without any tenderness or rebound. Past Medical History:  Diagnosis Date   Allergy    grass etc.   Barrett's esophagus    BPH (benign prostatic hyperplasia)    Chest pain, atypical    Chronic kidney disease    kidney stones   Coronary artery disease    GERD (gastroesophageal reflux disease)    Barrett's   Hernia    Hyperlipidemia    Hypertension    Myocardial infarction [facility])    Ulcer 1960   Past Surgical History:  Procedure Laterality Date   CARDIAC CATHETERIZATION  02/14/2007   MITRAL REGURGITATION. LV NORMAL   CHOLECYSTECTOMY     COLONOSCOPY     CORONARY ARTERY BYPASS GRAFT     twice  2 by passes each time   INGUINAL HERNIA REPAIR     bilateral done twice   LITHOTRIPSY     UPPER GASTROINTESTINAL ENDOSCOPY     Current Outpatient  Medications on File Prior to Visit  Medication Sig Dispense Refill   aspirin 81 MG tablet Take 81 mg by mouth daily.       Cholecalciferol (VITAMIN D) 2000 units CAPS Take 1 capsule by mouth daily.     Dutasteride-Tamsulosin HCl (JALYN PO) Take by mouth daily. Takes this instead of Flomax - hasnt started Flomax yet     finasteride (PROSCAR) 5 MG tablet Take 1 tablet (5 mg total) by mouth daily. Stop avodart 30 tablet 1   fluticasone (FLONASE) 50 MCG/ACT nasal spray Place 2 sprays into both nostrils 2 (two) times daily.     lansoprazole (PREVACID) 30 MG capsule Take 1 capsule (30 mg total) by mouth daily. 90 capsule 3   levocetirizine (XYZAL) 5 MG tablet Take 1 tablet (5  mg total) by mouth every evening. 30 tablet 0   metoprolol succinate (TOPROL-XL) 25 MG 24 hr tablet Take 1 tablet (25 mg total) by mouth daily. 90 tablet 3   Multiple Vitamin (MULTIVITAMIN) tablet Take 1 tablet by mouth daily.       nitroGLYCERIN (NITROSTAT) 0.4 MG SL tablet Place 1 tablet (0.4 mg total) under the tongue every 5 (five) minutes as needed. 50 tablet 4   Omega-3 Fatty Acids (FISH OIL) 1000 MG CPDR Take 1,000 mg by mouth daily.       predniSONE (DELTASONE) 20 MG tablet 3 tabs poqday 1-2, 2 tabs poqday 3-4, 1 tab poqday 5-6 12 tablet 0   simvastatin (ZOCOR) 10 MG tablet Take 1 tablet (10 mg total) by mouth at bedtime. 90 tablet 3   tamsulosin (FLOMAX) 0.4 MG CAPS capsule Take 1 capsule (0.4 mg total) by mouth daily. 30 capsule 3   Current Facility-Administered Medications on File Prior to Visit  Medication Dose Route Frequency Provider Last Rate Last Dose   0.9 %  sodium chloride infusion  500 mL Intravenous Once Armbruster, Carlota Raspberry, MD       Allergies  Allergen Reactions   Imdur [Isosorbide Mononitrate]     Unknown, per pt    Meloxicam     Facial swelling    Social History   Socioeconomic History   Marital status: Married    Spouse name: Not on file   Number of children: 2   Years of education: Not on file   Highest education level: Not on file  Occupational History   Occupation: rental bussiness owner  Social Needs   Financial resource strain: Not on file   Food insecurity:    Worry: Not on file    Inability: Not on file   Transportation needs:    Medical: Not on file    Non-medical: Not on file  Tobacco Use   Smoking status: Never Smoker   Smokeless tobacco: Never Used  Substance and Sexual Activity   Alcohol use: No   Drug use: No   Sexual activity: Not on file  Lifestyle   Physical activity:    Days per week: Not on file    Minutes per session: Not on file   Stress: Not on file  Relationships   Social connections:     Talks on phone: Not on file    Gets together: Not on file    Attends religious service: Not on file    Active member of club or organization: Not on file    Attends meetings of clubs or organizations: Not on file    Relationship status: Not on file   Intimate partner violence:  Fear of current or ex partner: Not on file    Emotionally abused: Not on file    Physically abused: Not on file    Forced sexual activity: Not on file  Other Topics Concern   Not on file  Social History Narrative   Not on file    Past Medical History:  Diagnosis Date   Allergy    grass etc.   Barrett's esophagus    BPH (benign prostatic hyperplasia)    Chest pain, atypical    Chronic kidney disease    kidney stones   Coronary artery disease    GERD (gastroesophageal reflux disease)    Barrett's   Hernia    Hyperlipidemia    Hypertension    Myocardial infarction ([place])    Ulcer 1960   Past Surgical History:  Procedure Laterality Date   CARDIAC CATHETERIZATION  02/14/2007   MITRAL REGURGITATION. LV NORMAL   CHOLECYSTECTOMY     COLONOSCOPY     CORONARY ARTERY BYPASS GRAFT     twice  2 by passes each time   INGUINAL HERNIA REPAIR     bilateral done twice   LITHOTRIPSY     UPPER GASTROINTESTINAL ENDOSCOPY     Current Outpatient Medications on File Prior to Visit  Medication Sig Dispense Refill   aspirin 81 MG tablet Take 81 mg by mouth daily.       Cholecalciferol (VITAMIN D) 2000 units CAPS Take 1 capsule by mouth daily.     Dutasteride-Tamsulosin HCl (JALYN PO) Take by mouth daily. Takes this instead of Flomax - hasnt started Flomax yet     finasteride (PROSCAR) 5 MG tablet Take 1 tablet (5 mg total) by mouth daily. Stop avodart 30 tablet 1   fluticasone (FLONASE) 50 MCG/ACT nasal spray Place 2 sprays into both nostrils 2 (two) times daily.     lansoprazole (PREVACID) 30 MG capsule Take 1 capsule (30 mg total) by mouth daily. 90 capsule 3   levocetirizine (XYZAL)  5 MG tablet Take 1 tablet (5 mg total) by mouth every evening. 30 tablet 0   metoprolol succinate (TOPROL-XL) 25 MG 24 hr tablet Take 1 tablet (25 mg total) by mouth daily. 90 tablet 3   Multiple Vitamin (MULTIVITAMIN) tablet Take 1 tablet by mouth daily.       nitroGLYCERIN (NITROSTAT) 0.4 MG SL tablet Place 1 tablet (0.4 mg total) under the tongue every 5 (five) minutes as needed. 50 tablet 4   Omega-3 Fatty Acids (FISH OIL) 1000 MG CPDR Take 1,000 mg by mouth daily.       predniSONE (DELTASONE) 20 MG tablet 3 tabs poqday 1-2, 2 tabs poqday 3-4, 1 tab poqday 5-6 12 tablet 0   simvastatin (ZOCOR) 10 MG tablet Take 1 tablet (10 mg total) by mouth at bedtime. 90 tablet 3   tamsulosin (FLOMAX) 0.4 MG CAPS capsule Take 1 capsule (0.4 mg total) by mouth daily. 30 capsule 3   Current Facility-Administered Medications on File Prior to Visit  Medication Dose Route Frequency Provider Last Rate Last Dose   0.9 %  sodium chloride infusion  500 mL Intravenous Once Armbruster, Carlota Raspberry, MD       Allergies  Allergen Reactions   Imdur [Isosorbide Mononitrate]     Unknown, per pt    Meloxicam     Facial swelling    Social History   Socioeconomic History   Marital status: Married    Spouse name: Not on file   Number of children: 2  Years of education: Not on file   Highest education level: Not on file  Occupational History   Occupation: rental bussiness owner  Social Needs   Financial resource strain: Not on file   Food insecurity:    Worry: Not on file    Inability: Not on file   Transportation needs:    Medical: Not on file    Non-medical: Not on file  Tobacco Use   Smoking status: Never Smoker   Smokeless tobacco: Never Used  Substance and Sexual Activity   Alcohol use: No   Drug use: No   Sexual activity: Not on file  Lifestyle   Physical activity:    Days per week: Not on file    Minutes per session: Not on file   Stress: Not on file  Relationships     Social connections:    Talks on phone: Not on file    Gets together: Not on file    Attends religious service: Not on file    Active member of club or organization: Not on file    Attends meetings of clubs or organizations: Not on file    Relationship status: Not on file   Intimate partner violence:    Fear of current or ex partner: Not on file    Emotionally abused: Not on file    Physically abused: Not on file    Forced sexual activity: Not on file  Other Topics Concern   Not on file  Social History Narrative   Not on file      Review of Systems  Gastrointestinal: Positive for diarrhea.  All other systems reviewed and are negative.      Objective:   Physical Exam Vitals signs reviewed.  Constitutional:      General: He is not in acute distress.    Appearance: Normal appearance. He is well-developed. He is not ill-appearing, toxic-appearing or diaphoretic.  HENT:     Right Ear: Tympanic membrane and ear canal normal.     Left Ear: Tympanic membrane and ear canal normal.     Nose: No rhinorrhea.     Right Sinus: Frontal sinus tenderness present.     Left Sinus: Frontal sinus tenderness present.  Cardiovascular:     Rate and Rhythm: Normal rate and regular rhythm.     Heart sounds: Normal heart sounds.  Pulmonary:     Effort: Pulmonary effort is normal. No tachypnea, accessory muscle usage or prolonged expiration.     Breath sounds: Normal breath sounds. No decreased air movement. No decreased breath sounds, wheezing, rhonchi or rales.  Abdominal:     General: Bowel sounds are normal. There is no distension.     Palpations: Abdomen is soft. There is no mass.     Tenderness: There is no abdominal tenderness. There is no guarding or rebound.    Genitourinary:    Prostate: Not enlarged, not tender and no nodules present.     Rectum: Normal. No mass, tenderness or anal fissure. Normal anal tone.  Neurological:     Mental Status: He is alert.            Assessment & Plan:  Lower abdominal pain - Plan: Urinalysis, Routine w reflex microscopic  Patient symptoms sound like intestinal spasms versus bladder irritation versus prostatitis.  However the patient is asymptomatic at the present time.  I favor intestinal spasms.  Another possibility would be urinary retention secondary to antihistamine and decongestant use recently for his upper respiratory infection.  Obtain urinalysis to rule out hematuria or evidence of a urinary tract infection.  Perform prostate exam to rule out prostatitis.  If these tests are negative, obtain CBC, CMP, PSA as well as an abdominal x-ray.  Urinalysis today shows trace hemoglobin but no leukocyte esterase.  No nitrate.  Therefore I do not believe this represents a urinary tract infection or kidney infection.  Prostate exam was performed today.  Prostate is slightly enlarged however there is no nodularity.  There is no tenderness on exam.  Therefore I do not believe this is prostatitis or urinary retention.  Patient feels no relief from the pain with urination which makes me believe this is less likely urinary retention.  Furthermore his bladder is not palpable on exam.  Therefore my leading suspect would be diverticular pain versus intestinal spasm.  I will give the patient Bentyl 20 mg every 6 hours as needed pain in case it occurs again.  Obtain an x-ray of the abdomen to evaluate for any evidence of obstruction or severe constipation.

## 2019-01-05 LAB — COMPLETE METABOLIC PANEL WITH GFR
AG Ratio: 2 (calc) (ref 1.0–2.5)
ALT: 15 U/L (ref 9–46)
AST: 14 U/L (ref 10–35)
Albumin: 3.8 g/dL (ref 3.6–5.1)
Alkaline phosphatase (APISO): 61 U/L (ref 35–144)
BILIRUBIN TOTAL: 0.5 mg/dL (ref 0.2–1.2)
BUN: 21 mg/dL (ref 7–25)
CHLORIDE: 100 mmol/L (ref 98–110)
CO2: 27 mmol/L (ref 20–32)
Calcium: 9.2 mg/dL (ref 8.6–10.3)
Creat: 0.88 mg/dL (ref 0.70–1.11)
GFR, EST AFRICAN AMERICAN: 94 mL/min/{1.73_m2} (ref >=60)
GFR, Est Non African American: 81 mL/min/{1.73_m2} (ref >=60)
Globulin: 1.9 g/dL (calc) (ref 1.9–3.7)
Glucose, Bld: 251 mg/dL — ABNORMAL HIGH (ref 65–99)
Potassium: 3.7 mmol/L (ref 3.5–5.3)
Sodium: 136 mmol/L (ref 135–146)
TOTAL PROTEIN: 5.7 g/dL — AB (ref 6.1–8.1)

## 2019-01-05 LAB — CBC WITH DIFFERENTIAL/PLATELET
Absolute Monocytes: 825 cells/uL (ref 200–950)
Basophils Absolute: 33 cells/uL (ref 0–200)
Basophils Relative: 0.3 %
Eosinophils Absolute: 11 cells/uL — ABNORMAL LOW (ref 15–500)
Eosinophils Relative: 0.1 %
HCT: 41.4 % (ref 38.5–50.0)
Hemoglobin: 14.2 g/dL (ref 13.2–17.1)
Lymphs Abs: 1518 cells/uL (ref 850–3900)
MCH: 30.5 pg (ref 27.0–33.0)
MCHC: 34.3 g/dL (ref 32.0–36.0)
MCV: 89 fL (ref 80.0–100.0)
MPV: 11.1 fL (ref 7.5–12.5)
Monocytes Relative: 7.5 %
Neutro Abs: 8613 cells/uL — ABNORMAL HIGH (ref 1500–7800)
Neutrophils Relative %: 78.3 %
Platelets: 120 10*3/uL — ABNORMAL LOW (ref 140–400)
RBC: 4.65 10*6/uL (ref 4.20–5.80)
RDW: 11.7 % (ref 11.0–15.0)
TOTAL LYMPHOCYTE: 13.8 %
WBC: 11 10*3/uL — AB (ref 3.8–10.8)

## 2019-01-05 LAB — HEMOGLOBIN A1C
Hgb A1c MFr Bld: 7.1 % of total Hgb — ABNORMAL HIGH (ref <5.7)
Mean Plasma Glucose: 157 (calc)
eAG (mmol/L): 8.7 (calc)

## 2019-01-05 LAB — PSA: PSA: 0.3 ng/mL (ref <=4.0)

## 2019-01-05 LAB — TEST AUTHORIZATION

## 2019-01-17 ENCOUNTER — Other Ambulatory Visit: Payer: Self-pay | Admitting: Family Medicine

## 2019-03-14 ENCOUNTER — Other Ambulatory Visit: Payer: Self-pay | Admitting: Family Medicine

## 2019-03-25 IMAGING — CR ABDOMEN - 2 VIEW
2 series · 2 of 2 positions shown · non-contrast
Comparison: 02/05/2014

CLINICAL DATA: Lower abdominal pain for 1 day

EXAM:
ABDOMEN - 2 VIEW

[w abdomen upright *]
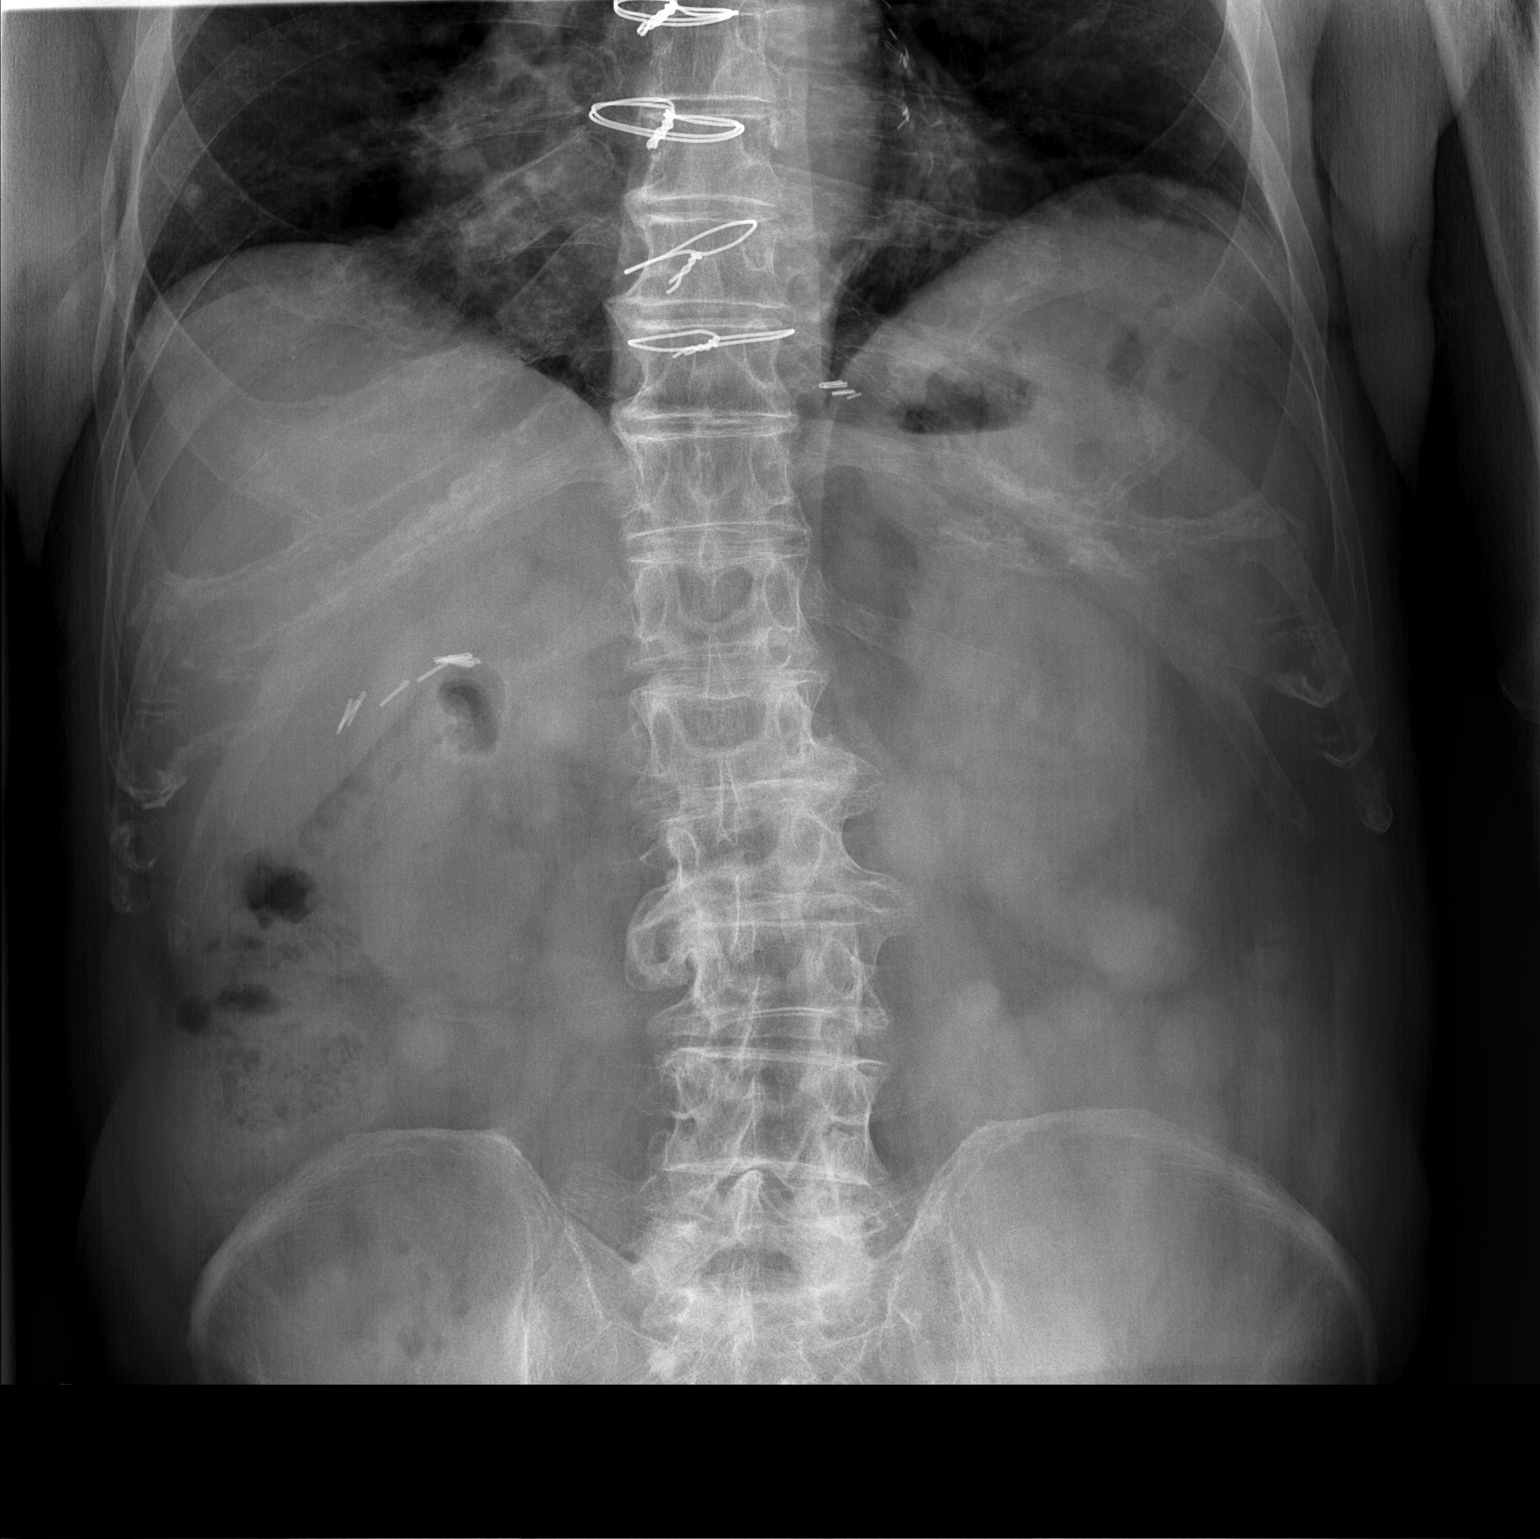

[t abdomen supine]
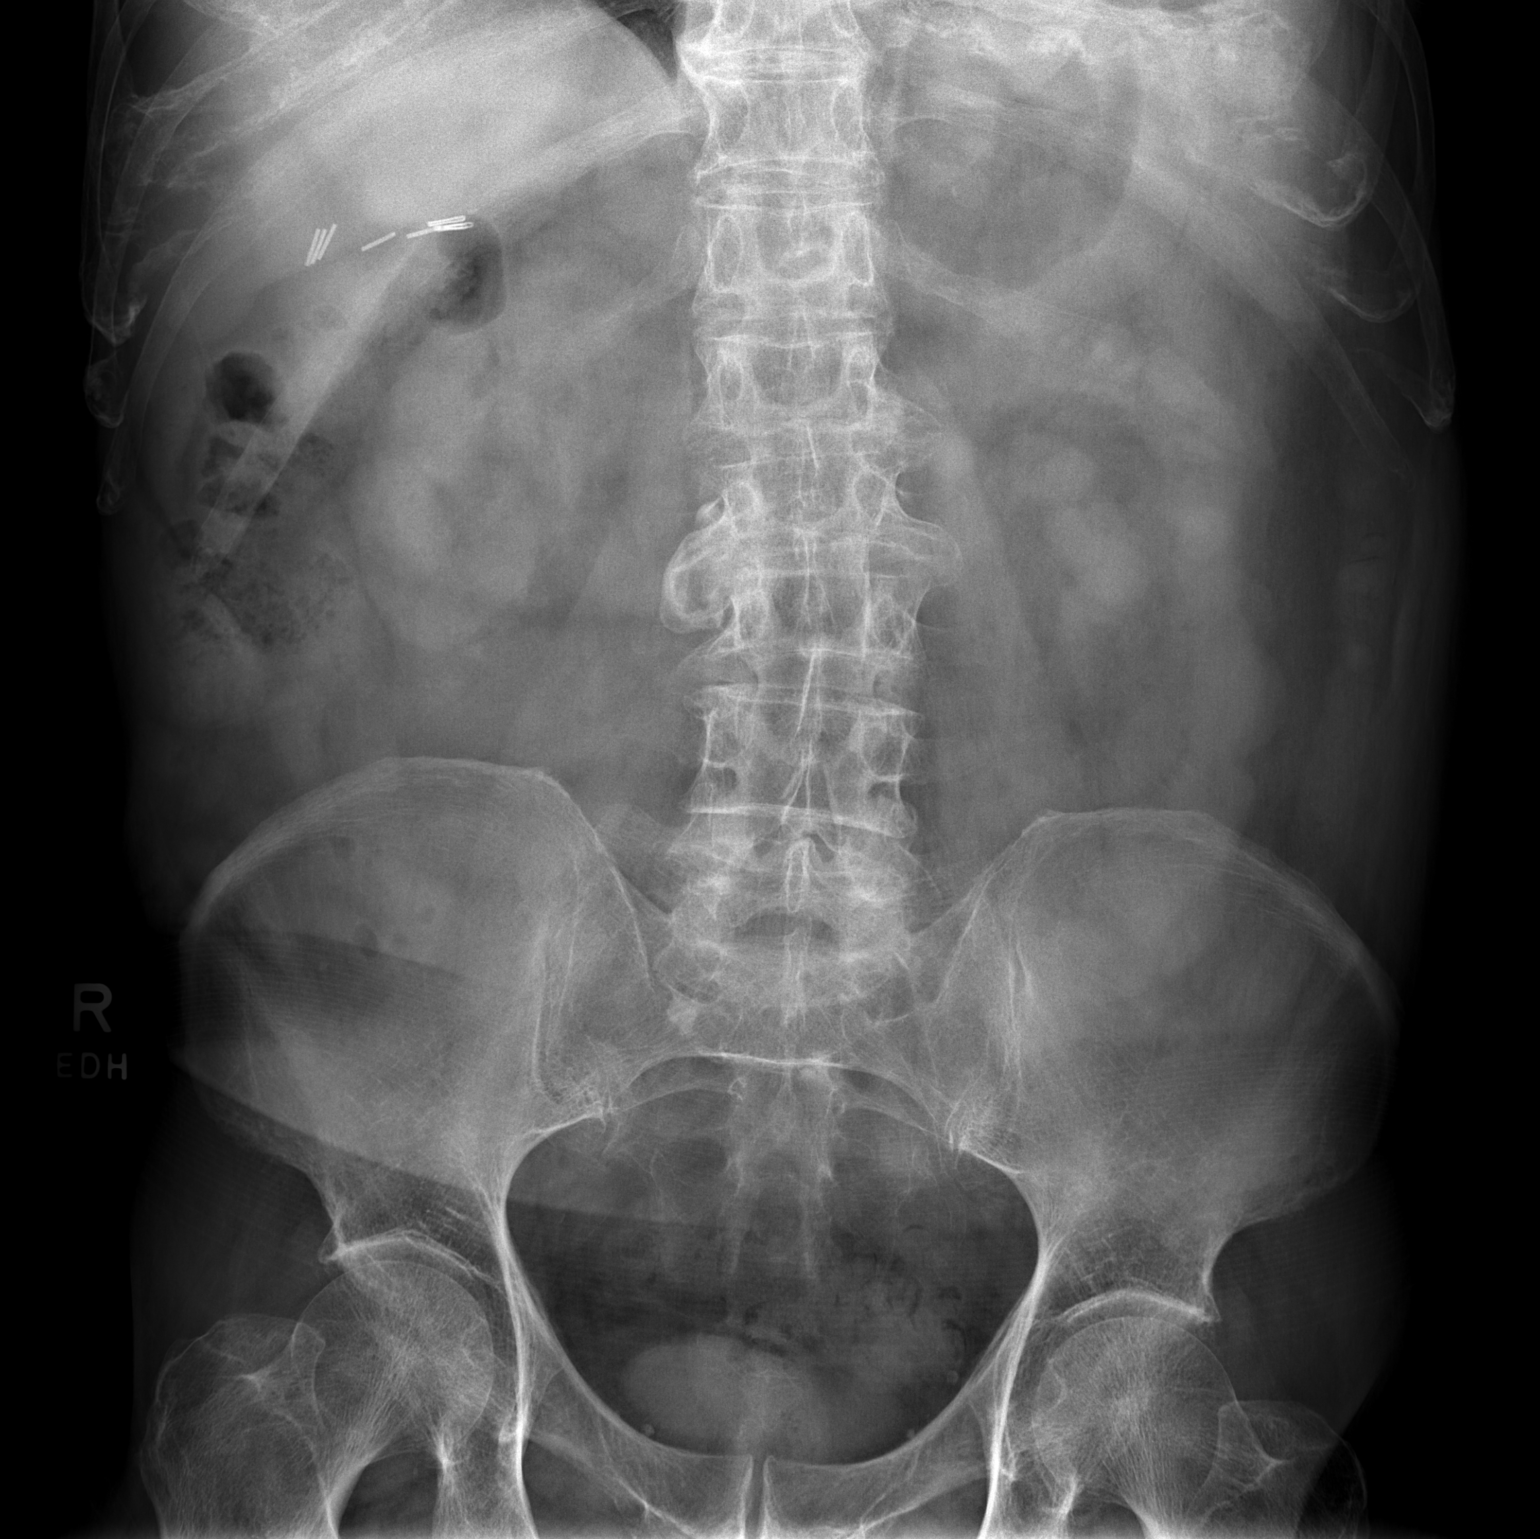

[2 of 2 positions shown; findings below may reference images not displayed]

FINDINGS: Five lumbar type vertebral bodies are well visualized. Vertebral
body height is well maintained. Scattered large and small bowel gas
is noted. No free air is seen. No obstructive changes are noted. No
abnormal calculi are seen. Lumbar spine demonstrates degenerative
changes stable from the previous exam.
IMPRESSION: No acute abnormality noted.

## 2019-03-30 ENCOUNTER — Other Ambulatory Visit: Payer: Self-pay

## 2019-03-30 ENCOUNTER — Other Ambulatory Visit: Payer: Medicare HMO

## 2019-03-30 DIAGNOSIS — N200 Calculus of kidney: Secondary | ICD-10-CM

## 2019-03-30 DIAGNOSIS — D72829 Elevated white blood cell count, unspecified: Secondary | ICD-10-CM | POA: Diagnosis not present

## 2019-03-30 DIAGNOSIS — R7303 Prediabetes: Secondary | ICD-10-CM

## 2019-03-30 NOTE — Progress Notes (Unsigned)
bmp 

## 2019-03-31 ENCOUNTER — Encounter: Payer: Self-pay | Admitting: Family Medicine

## 2019-03-31 ENCOUNTER — Other Ambulatory Visit: Payer: Self-pay

## 2019-03-31 ENCOUNTER — Ambulatory Visit (INDEPENDENT_AMBULATORY_CARE_PROVIDER_SITE_OTHER): Payer: Medicare HMO | Admitting: Family Medicine

## 2019-03-31 VITALS — BP 124/62 | HR 72 | Temp 97.9°F | Resp 14 | Ht 69.0 in | Wt 164.0 lb

## 2019-03-31 DIAGNOSIS — D696 Thrombocytopenia, unspecified: Secondary | ICD-10-CM | POA: Diagnosis not present

## 2019-03-31 DIAGNOSIS — E118 Type 2 diabetes mellitus with unspecified complications: Secondary | ICD-10-CM | POA: Diagnosis not present

## 2019-03-31 DIAGNOSIS — I10 Essential (primary) hypertension: Secondary | ICD-10-CM | POA: Diagnosis not present

## 2019-03-31 DIAGNOSIS — I2510 Atherosclerotic heart disease of native coronary artery without angina pectoris: Secondary | ICD-10-CM

## 2019-03-31 LAB — CBC WITH DIFFERENTIAL/PLATELET
Absolute Monocytes: 483 cells/uL (ref 200–950)
Basophils Absolute: 41 cells/uL (ref 0–200)
Basophils Relative: 0.6 %
Eosinophils Absolute: 88 cells/uL (ref 15–500)
Eosinophils Relative: 1.3 %
HCT: 41.9 % (ref 38.5–50.0)
Hemoglobin: 14.4 g/dL (ref 13.2–17.1)
Lymphs Abs: 1428 cells/uL (ref 850–3900)
MCH: 30.5 pg (ref 27.0–33.0)
MCHC: 34.4 g/dL (ref 32.0–36.0)
MCV: 88.8 fL (ref 80.0–100.0)
MPV: 11.4 fL (ref 7.5–12.5)
Monocytes Relative: 7.1 %
Neutro Abs: 4760 cells/uL (ref 1500–7800)
Neutrophils Relative %: 70 %
Platelets: 91 10*3/uL — ABNORMAL LOW (ref 140–400)
RBC: 4.72 10*6/uL (ref 4.20–5.80)
RDW: 12.2 % (ref 11.0–15.0)
Total Lymphocyte: 21 %
WBC: 6.8 10*3/uL (ref 3.8–10.8)

## 2019-03-31 LAB — BASIC METABOLIC PANEL
BUN: 20 mg/dL (ref 7–25)
CO2: 26 mmol/L (ref 20–32)
Calcium: 9.2 mg/dL (ref 8.6–10.3)
Chloride: 105 mmol/L (ref 98–110)
Creat: 0.92 mg/dL (ref 0.70–1.11)
Glucose, Bld: 157 mg/dL — ABNORMAL HIGH (ref 65–99)
Potassium: 4.4 mmol/L (ref 3.5–5.3)
Sodium: 140 mmol/L (ref 135–146)

## 2019-03-31 LAB — HEMOGLOBIN A1C
Hgb A1c MFr Bld: 6.8 % of total Hgb — ABNORMAL HIGH (ref <5.7)
Mean Plasma Glucose: 148 (calc)
eAG (mmol/L): 8.2 (calc)

## 2019-03-31 NOTE — Progress Notes (Signed)
Subjective:    Patient ID: Andrew [name], male    DOB: [birth year], 78 y.o.   MRN: [id number]  HPI Patient is here today for regular checkup.  Patient has a history of type 2 diabetes mellitus and a longstanding history of coronary artery disease.  He denies any chest pain shortness of breath or dyspnea on exertion.  He denies any myalgias or right upper quadrant pain.  He denies any nausea vomiting or abdominal pain.  Overall he is doing pretty well.  His most recent lab work is listed below: Lab on 03/30/2019  Component Date Value Ref Range Status  . Hgb A1c MFr Bld 03/30/2019 6.8* <5.7 % of total Hgb Final   Comment: For someone without known diabetes, a hemoglobin A1c value of 6.5% or greater indicates that they may have  diabetes and this should be confirmed with a follow-up  test. . For someone with known diabetes, a value <7% indicates  that their diabetes is well controlled and a value  greater than or equal to 7% indicates suboptimal  control. A1c targets should be individualized based on  duration of diabetes, age, comorbid conditions, and  other considerations. . Currently, no consensus exists regarding use of hemoglobin A1c for diagnosis of diabetes for children. .   . Mean Plasma Glucose 03/30/2019 148  (calc) Final  . eAG (mmol/L) 03/30/2019 8.2  (calc) Final  . WBC 03/30/2019 6.8  3.8 - 10.8 Thousand/uL Final  . RBC 03/30/2019 4.72  4.20 - 5.80 Million/uL Final  . Hemoglobin 03/30/2019 14.4  13.2 - 17.1 g/dL Final  . HCT 03/30/2019 41.9  38.5 - 50.0 % Final  . MCV 03/30/2019 88.8  80.0 - 100.0 fL Final  . MCH 03/30/2019 30.5  27.0 - 33.0 pg Final  . MCHC 03/30/2019 34.4  32.0 - 36.0 g/dL Final  . RDW 03/30/2019 12.2  11.0 - 15.0 % Final  . Platelets 03/30/2019 91* 140 - 400 Thousand/uL Final   Platelet clumps noted on smear-count appears adequate.  [name] MPV 03/30/2019 11.4  7.5 - 12.5 fL Final  . Neutro Abs 03/30/2019 4,760  1,500 - 7,800 cells/uL Final  . Lymphs  Abs 03/30/2019 1,428  850 - 3,900 cells/uL Final  . Absolute Monocytes 03/30/2019 483  200 - 950 cells/uL Final  . Eosinophils Absolute 03/30/2019 88  15 - 500 cells/uL Final  . Basophils Absolute 03/30/2019 41  0 - 200 cells/uL Final  . Neutrophils Relative % 03/30/2019 70  % Final  . Total Lymphocyte 03/30/2019 21.0  % Final  . Monocytes Relative 03/30/2019 7.1  % Final  . Eosinophils Relative 03/30/2019 1.3  % Final  . Basophils Relative 03/30/2019 0.6  % Final  . Smear Review 03/30/2019    Final   Comment: Review of peripheral smear confirms automated results.   . Glucose, Bld 03/30/2019 157* 65 - 99 mg/dL Final   Comment: .            Fasting reference interval . For someone without known diabetes, a glucose value >125 mg/dL indicates that they may have diabetes and this should be confirmed with a follow-up test. .   . BUN 03/30/2019 20  7 - 25 mg/dL Final  . Creat 03/30/2019 0.92  0.70 - 1.11 mg/dL Final   Comment: For patients >12 years of age, the reference limit for Creatinine is approximately 13% higher for people identified as African-American. .   Havery Moros Ratio 96/78/9381 NOT APPLICABLE  6 - 22 (  calc) Final  . Sodium 03/30/2019 140  135 - 146 mmol/L Final  . Potassium 03/30/2019 4.4  3.5 - 5.3 mmol/L Final  . Chloride 03/30/2019 105  98 - 110 mmol/L Final  . CO2 03/30/2019 26  20 - 32 mmol/L Final  . Calcium 03/30/2019 9.2  8.6 - 10.3 mg/dL Final   Past Medical History:  Diagnosis Date  . Allergy    grass etc.  . Barrett's esophagus   . BPH (benign prostatic hyperplasia)   . Chest pain, atypical   . Chronic kidney disease    kidney stones  . Coronary artery disease   . GERD (gastroesophageal reflux disease)    Barrett's  . Hernia   . Hyperlipidemia   . Hypertension   . Myocardial infarction ([place])   . Thrombocytopenia ([place])   . Ulcer 1960   Past Surgical History:  Procedure Laterality Date  . CARDIAC CATHETERIZATION  02/14/2007   MITRAL  REGURGITATION. LV NORMAL  . CHOLECYSTECTOMY    . COLONOSCOPY    . CORONARY ARTERY BYPASS GRAFT     twice  2 by passes each time  . INGUINAL HERNIA REPAIR     bilateral done twice  . LITHOTRIPSY    . UPPER GASTROINTESTINAL ENDOSCOPY     Current Outpatient Medications on File Prior to Visit  Medication Sig Dispense Refill  . aspirin 81 MG tablet Take 81 mg by mouth daily.      . Cholecalciferol (VITAMIN D) 2000 units CAPS Take 1 capsule by mouth daily.    [name] dicyclomine (BENTYL) 20 MG tablet Take 1 tablet (20 mg total) by mouth 4 (four) times daily as needed for spasms. 20 tablet 1  . Dutasteride-Tamsulosin HCl (JALYN PO) Take by mouth daily. Takes this instead of Flomax - hasnt started Flomax yet    . finasteride (PROSCAR) 5 MG tablet TAKE 1 TABLET(5 MG) BY MOUTH DAILY. STOP AVODART 90 tablet 3  . fluticasone (FLONASE) 50 MCG/ACT nasal spray Place 2 sprays into both nostrils 2 (two) times daily.    . lansoprazole (PREVACID) 30 MG capsule Take 1 capsule (30 mg total) by mouth daily. 90 capsule 3  . levocetirizine (XYZAL) 5 MG tablet TAKE 1 TABLET(5 MG) BY MOUTH EVERY EVENING 30 tablet 5  . metoprolol succinate (TOPROL-XL) 25 MG 24 hr tablet Take 1 tablet (25 mg total) by mouth daily. 90 tablet 3  . Multiple Vitamin (MULTIVITAMIN) tablet Take 1 tablet by mouth daily.      . nitroGLYCERIN (NITROSTAT) 0.4 MG SL tablet Place 1 tablet (0.4 mg total) under the tongue every 5 (five) minutes as needed. 50 tablet 4  . Omega-3 Fatty Acids (FISH OIL) 1000 MG CPDR Take 1,000 mg by mouth daily.      . simvastatin (ZOCOR) 10 MG tablet Take 1 tablet (10 mg total) by mouth at bedtime. 90 tablet 3  . tamsulosin (FLOMAX) 0.4 MG CAPS capsule Take 1 capsule (0.4 mg total) by mouth daily. 30 capsule 3   Current Facility-Administered Medications on File Prior to Visit  Medication Dose Route Frequency Provider Last Rate Last Dose  . 0.9 %  sodium chloride infusion  500 mL Intravenous Once Armbruster, Carlota Raspberry, MD        Allergies  Allergen Reactions  . Imdur [Isosorbide Mononitrate]     Unknown, per pt   . Meloxicam     Facial swelling    Social History   Socioeconomic History  . Marital status: Married    Spouse  name: Not on file  . Number of children: 2  . Years of education: Not on file  . Highest education level: Not on file  Occupational History  . Occupation: Office manager  Social Needs  . Financial resource strain: Not on file  . Food insecurity    Worry: Not on file    Inability: Not on file  . Transportation needs    Medical: Not on file    Non-medical: Not on file  Tobacco Use  . Smoking status: Never Smoker  . Smokeless tobacco: Never Used  Substance and Sexual Activity  . Alcohol use: No  . Drug use: No  . Sexual activity: Not on file  Lifestyle  . Physical activity    Days per week: Not on file    Minutes per session: Not on file  . Stress: Not on file  Relationships  . Social Herbalist on phone: Not on file    Gets together: Not on file    Attends religious service: Not on file    Active member of club or organization: Not on file    Attends meetings of clubs or organizations: Not on file    Relationship status: Not on file  . Intimate partner violence    Fear of current or ex partner: Not on file    Emotionally abused: Not on file    Physically abused: Not on file    Forced sexual activity: Not on file  Other Topics Concern  . Not on file  Social History Narrative  . Not on file      Review of Systems  All other systems reviewed and are negative.      Objective:   Physical Exam Vitals signs reviewed.  Constitutional:      Appearance: Normal appearance. He is normal weight.  Cardiovascular:     Rate and Rhythm: Normal rate and regular rhythm.     Heart sounds: Normal heart sounds. No murmur. No friction rub. No gallop.   Pulmonary:     Effort: Pulmonary effort is normal. No respiratory distress.     Breath sounds:  Normal breath sounds. No stridor. No wheezing, rhonchi or rales.  Chest:     Chest wall: No tenderness.  Abdominal:     General: Bowel sounds are normal.     Palpations: Abdomen is soft.  Musculoskeletal:     Right lower leg: No edema.     Left lower leg: No edema.  Neurological:     Mental Status: He is alert.           Assessment & Plan:  The primary encounter diagnosis was Thrombocytopenia ([place]). Diagnoses of ASCVD (arteriosclerotic cardiovascular disease), Benign essential HTN, and Controlled type 2 diabetes mellitus with complication, without long-term current use of insulin ([place]) were also pertinent to this visit. Patient's lab work is significant for thrombocytopenia.  His platelet count is 120 recently and it dropped to 91.  He is asymptomatic.  I suspect ITP.  I recommended that we monitor this every 3 months.  If levels continue to drop, I would consult hematology however at the present time I see no necessary intervention.  Patient has a history of ASCVD.  His blood pressure today is well controlled.  His hemoglobin A1c is acceptable at 6.8. I would recommend no changes in his medication at this time.

## 2019-04-04 ENCOUNTER — Telehealth: Payer: Self-pay | Admitting: Family Medicine

## 2019-04-04 NOTE — Telephone Encounter (Signed)
sure

## 2019-04-04 NOTE — Telephone Encounter (Signed)
pt had tooth pulled yesterday and he did stop the asa and the dentist told him to take ibuprofen for the pain but he wanted to make sure that was ok to take. Please advise.

## 2019-04-04 NOTE — Telephone Encounter (Signed)
LMOVM stating that it was ok to take ibuprofen

## 2019-04-13 ENCOUNTER — Other Ambulatory Visit: Payer: Self-pay | Admitting: Family Medicine

## 2019-04-13 MED ORDER — FINASTERIDE 5 MG PO TABS: ORAL_TABLET | ORAL | 3 refills | Status: DC

## 2019-04-13 MED ORDER — LEVOCETIRIZINE DIHYDROCHLORIDE 5 MG PO TABS: ORAL_TABLET | ORAL | 3 refills | Status: DC

## 2019-04-13 MED ORDER — NITROGLYCERIN 0.4 MG SL SUBL: 0.4000 mg | SUBLINGUAL_TABLET | SUBLINGUAL | 3 refills | Status: DC | PRN

## 2019-04-13 MED ORDER — TAMSULOSIN HCL 0.4 MG PO CAPS: 0.4000 mg | ORAL_CAPSULE | Freq: Every day | ORAL | 3 refills | Status: DC

## 2019-04-17 ENCOUNTER — Other Ambulatory Visit: Payer: Self-pay | Admitting: *Deleted

## 2019-04-17 MED ORDER — NITROGLYCERIN 0.4 MG SL SUBL: 0.4000 mg | SUBLINGUAL_TABLET | SUBLINGUAL | 3 refills | Status: DC | PRN

## 2019-04-17 MED ORDER — TAMSULOSIN HCL 0.4 MG PO CAPS: 0.4000 mg | ORAL_CAPSULE | Freq: Every day | ORAL | 3 refills | Status: DC

## 2019-04-17 MED ORDER — FINASTERIDE 5 MG PO TABS: ORAL_TABLET | ORAL | 3 refills | Status: DC

## 2019-04-17 MED ORDER — LEVOCETIRIZINE DIHYDROCHLORIDE 5 MG PO TABS: ORAL_TABLET | ORAL | 3 refills | Status: DC

## 2019-04-19 ENCOUNTER — Other Ambulatory Visit: Payer: Self-pay | Admitting: Family Medicine

## 2019-04-20 ENCOUNTER — Other Ambulatory Visit: Payer: Self-pay | Admitting: Family Medicine

## 2019-04-24 ENCOUNTER — Telehealth: Payer: Self-pay

## 2019-04-24 ENCOUNTER — Other Ambulatory Visit: Payer: Self-pay | Admitting: Family Medicine

## 2019-04-24 MED ORDER — PREDNISONE 20 MG PO TABS: ORAL_TABLET | ORAL | 0 refills | Status: DC

## 2019-04-24 NOTE — Telephone Encounter (Signed)
Pt.notified

## 2019-04-24 NOTE — Telephone Encounter (Signed)
Pt called to report that he got stung by a wasp on 07/11 and it is still swollen. Pt would like to know what he can take or if you can sent in a Rx? Pt states he has only taken tylenol with no relief. Please advise.

## 2019-04-24 NOTE — Telephone Encounter (Signed)
I will send in prednisone taper pack

## 2019-06-05 ENCOUNTER — Encounter: Payer: Self-pay | Admitting: Cardiovascular Disease

## 2019-06-05 NOTE — Progress Notes (Signed)
[name] Date of Birth  06/18/1941 [facility] [address]    [place], [place]  [address] [phone]  Fax  [phone]   problem list 1. Coronary artery disease 2. Hyperlipidemia 3.  Previous notes.   Limuel is a 78 year old gentleman with a history of coronary artery disease. He status post coronary artery bypass grafting. He also has a history of dyslipidemia.  He's recently been having some problems with back pain.  These episodes of back pain would typically occur when he is doing his normal household chores. It would typically occur in the middle of his back and radiate up through to the front of his chest and between the shoulder blades.  This was not similar to his previous episodes of angina prior to his bypass grafting.  He's been exercising on a regular basis and has not had these symptoms with exercise.   Sept. 11. 2014:  Ayhem is doing ok.  No angina.  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  No   Sept. 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  Raised a garden this years.  No CP. Works out regularly.    Oct. 4, 2016: Doing great.    i reviewed his labs with him.   Everything is stable .  Still raising cattle.  Beef prices are down quite a bit this year.    Oct. 24, 2017:   Doing well Still raising cows.    No CP .    September 29, 2017:  Faolan is seen today.  He has a history of coronary artery disease and hyperlipidemia. No CP  Has some shortness of breath. Has had some lower back pain and abd.  Still raising cows .   Hurt his right arm while using his  tractor   His last heart catheterization was Feb 14, 2007. The left anterior descending artery is occluded.  The saphenous vein graft to the second diagonal artery is a large graft and fills the left anterior descending artery in a retrograde fashion.  There is a 90% stenosis in the mid/distal LAD. The LIMA is atretic and does not supply any significant flow to the  LAD. Left circumflex artery has minor luminal irregularities.  The left circumflex artery is dominant.   May 11, 2018:  Doing well.   Treadmill for about 15 to 20 minutes each day.  No episodes of chest pain.  Has occasional swelling in rignht ankle but this is related to SVG harvest  Recent lipid panel from May, 2019 all look good.  Total cholesterol is 100.  HDL is 36.  The LDL is 50.  The triglyceride level is 62. Does not take NTG   Aug. 25, 2020 :  Alfonzia is seen today for follow up visit Hx of CABG in 1998 and then again in 2008 Doing well.  No cp or dyspnea . Still puts in a garden.   Raises cows.  Limited by a right shoulder injury .   Current Outpatient Medications on File Prior to Visit  Medication Sig Dispense Refill  . aspirin 81 MG tablet Take 81 mg by mouth daily.      . Cholecalciferol (VITAMIN D) 2000 units CAPS Take 1 capsule by mouth daily.    [name] dicyclomine (BENTYL) 20 MG tablet Take 1 tablet (20 mg total) by mouth 4 (four) times daily as needed for spasms. 20 tablet 1  . Dutasteride-Tamsulosin HCl (JALYN PO) Take by mouth daily.  Takes this instead of Flomax - hasnt started Flomax yet    . finasteride (PROSCAR) 5 MG tablet TAKE 1 TABLET(5 MG) BY MOUTH DAILY. 90 tablet 3  . fluticasone (FLONASE) 50 MCG/ACT nasal spray Place 2 sprays into both nostrils 2 (two) times daily.    . lansoprazole (PREVACID) 30 MG capsule Take 1 capsule (30 mg total) by mouth daily. 90 capsule 3  . levocetirizine (XYZAL) 5 MG tablet TAKE 1 TABLET(5 MG) BY MOUTH EVERY EVENING 90 tablet 3  . metoprolol succinate (TOPROL-XL) 25 MG 24 hr tablet Take 1 tablet (25 mg total) by mouth daily. 90 tablet 3  . Multiple Vitamin (MULTIVITAMIN) tablet Take 1 tablet by mouth daily.      . nitroGLYCERIN (NITROSTAT) 0.4 MG SL tablet Place 1 tablet (0.4 mg total) under the tongue every 5 (five) minutes as needed. 75 tablet 3  . Omega-3 Fatty Acids (FISH OIL) 1000 MG CPDR Take 1,000 mg by mouth daily.      .  predniSONE (DELTASONE) 20 MG tablet 3 tabs poqday 1-2, 2 tabs poqday 3-4, 1 tab poqday 5-6 12 tablet 0  . simvastatin (ZOCOR) 10 MG tablet Take 1 tablet (10 mg total) by mouth at bedtime. 90 tablet 3  . tamsulosin (FLOMAX) 0.4 MG CAPS capsule TAKE 1 CAPSULE(0.4 MG) BY MOUTH DAILY 90 capsule 3  . tamsulosin (FLOMAX) 0.4 MG CAPS capsule TAKE 1 CAPSULE(0.4 MG) BY MOUTH DAILY 90 capsule 3   Current Facility-Administered Medications on File Prior to Visit  Medication Dose Route Frequency Provider Last Rate Last Dose  . 0.9 %  sodium chloride infusion  500 mL Intravenous Once Armbruster, Carlota Raspberry, MD        Allergies  Allergen Reactions  . Imdur [Isosorbide Mononitrate]     Unknown, per pt   . Meloxicam     Facial swelling     Past Medical History:  Diagnosis Date  . Allergy    grass etc.  . Barrett's esophagus   . BPH (benign prostatic hyperplasia)   . Chest pain, atypical   . Chronic kidney disease    kidney stones  . Coronary artery disease   . GERD (gastroesophageal reflux disease)    Barrett's  . Hernia   . Hyperlipidemia   . Hypertension   . Myocardial infarction ([place])   . Thrombocytopenia ([place])   . Ulcer 1960    Past Surgical History:  Procedure Laterality Date  . CARDIAC CATHETERIZATION  02/14/2007   MITRAL REGURGITATION. LV NORMAL  . CHOLECYSTECTOMY    . COLONOSCOPY    . CORONARY ARTERY BYPASS GRAFT     twice  2 by passes each time  . INGUINAL HERNIA REPAIR     bilateral done twice  . LITHOTRIPSY    . UPPER GASTROINTESTINAL ENDOSCOPY      Social History   Tobacco Use  Smoking Status Never Smoker  Smokeless Tobacco Never Used    Social History   Substance and Sexual Activity  Alcohol Use No    Family History  Problem Relation Age of Onset  . Heart disease Father   . Heart attack Mother   . Colon cancer Neg Hx   . Esophageal cancer Neg Hx   . Rectal cancer Neg Hx   . Stomach cancer Neg Hx     Reviw of Systems:   Noted in current  history.  Physical Exam: There were no vitals taken for this visit.  GEN:  Elderly male,  Appears younger than age  78 HEENT: Normal NECK: No JVD; No carotid bruits LYMPHATICS: No lymphadenopathy CARDIAC: RRR   RESPIRATORY:  Clear to auscultation without rales, wheezing or rhonchi  ABDOMEN: Soft, non-tender, non-distended MUSCULOSKELETAL:  No edema; No deformity  SKIN: Warm and dry NEUROLOGIC:  Alert and oriented x 3  ECG:   June 06, 2019:  Sinus bradycardia at 59.  No ST or T wave changes.     Assessment / Plan:    1. Coronary artery disease- no angina.  Overall is doing well Is keeping very busy .   2. Hyperlipidemia-    Check lipids today , continue labs.   3. Carotid artery disease:  Will get a duplex scan in a month    Mertie Moores, MD  06/05/2019 4:30 PM    [address] [place],  [place], [place]  [address] Pager [phone] Phone: [phone]; Fax: [phone]

## 2019-06-06 ENCOUNTER — Ambulatory Visit (INDEPENDENT_AMBULATORY_CARE_PROVIDER_SITE_OTHER): Payer: Medicare HMO | Admitting: Cardiovascular Disease

## 2019-06-06 ENCOUNTER — Other Ambulatory Visit: Payer: Self-pay

## 2019-06-06 ENCOUNTER — Encounter (INDEPENDENT_AMBULATORY_CARE_PROVIDER_SITE_OTHER): Payer: Self-pay

## 2019-06-06 VITALS — BP 130/70 | HR 59 | Ht 69.0 in | Wt 159.6 lb

## 2019-06-06 DIAGNOSIS — E785 Hyperlipidemia, unspecified: Secondary | ICD-10-CM

## 2019-06-06 DIAGNOSIS — I6523 Occlusion and stenosis of bilateral carotid arteries: Secondary | ICD-10-CM

## 2019-06-06 DIAGNOSIS — I779 Disorder of arteries and arterioles, unspecified: Secondary | ICD-10-CM

## 2019-06-06 DIAGNOSIS — I739 Peripheral vascular disease, unspecified: Secondary | ICD-10-CM

## 2019-06-06 DIAGNOSIS — I2510 Atherosclerotic heart disease of native coronary artery without angina pectoris: Secondary | ICD-10-CM

## 2019-06-06 NOTE — Patient Instructions (Addendum)
Medication Instructions:  Your physician recommends that you continue on your current medications as directed. Please refer to the Current Medication list given to you today.  If you need a refill on your cardiac medications before your next appointment, please call your pharmacy.   Lab work: TODAY - cholesterol, liver panel, basic metabolic panel  If you have labs (blood work) drawn today and your tests are completely normal, you will receive your results only by: [name] MyChart Message (if you have MyChart) OR . A paper copy in the mail If you have any lab test that is abnormal or we need to change your treatment, we will call you to review the results.  Testing/Procedures: Your physician has requested that you have a carotid duplex. This test is an ultrasound of the carotid arteries in your neck. It looks at blood flow through these arteries that supply the brain with blood. Allow one hour for this exam. There are no restrictions or special instructions.    Follow-Up: At [facility], you and your health needs are our priority.  As part of our continuing mission to provide you with exceptional heart care, we have created designated Provider Care Teams.  These Care Teams include your primary Cardiologist (physician) and Advanced Practice Providers (APPs -  Physician Assistants and Nurse Practitioners) who all work together to provide you with the care you need, when you need it. You will need a follow up appointment in:  1 years.  Please call our office 2 months in advance to schedule this appointment.  You may see Mertie Moores, MD or one of the following Advanced Practice Providers on your designated Care Team: Richardson Dopp, PA-C [place], [place] . Daune Perch, NP

## 2019-06-07 LAB — BASIC METABOLIC PANEL
BUN/Creatinine Ratio: 24 (ref 10–24)
BUN: 21 mg/dL (ref 8–27)
CO2: 22 mmol/L (ref 20–29)
Calcium: 9.2 mg/dL (ref 8.6–10.2)
Chloride: 106 mmol/L (ref 96–106)
Creatinine, Ser: 0.87 mg/dL (ref 0.76–1.27)
GFR calc Af Amer: 94 mL/min/{1.73_m2} (ref >59)
GFR calc non Af Amer: 82 mL/min/{1.73_m2} (ref >59)
Glucose: 196 mg/dL — ABNORMAL HIGH (ref 65–99)
Potassium: 4.3 mmol/L (ref 3.5–5.2)
Sodium: 141 mmol/L (ref 134–144)

## 2019-06-07 LAB — HEPATIC FUNCTION PANEL
ALT: 13 IU/L (ref 0–44)
AST: 17 IU/L (ref 0–40)
Albumin: 4.6 g/dL (ref 3.7–4.7)
Alkaline Phosphatase: 62 IU/L (ref 39–117)
Bilirubin Total: 0.4 mg/dL (ref 0.0–1.2)
Bilirubin, Direct: 0.13 mg/dL (ref 0.00–0.40)
Total Protein: 6 g/dL (ref 6.0–8.5)

## 2019-06-07 LAB — LIPID PANEL
Chol/HDL Ratio: 2.8 ratio (ref 0.0–5.0)
Cholesterol, Total: 98 mg/dL — ABNORMAL LOW (ref 100–199)
HDL: 35 mg/dL — ABNORMAL LOW (ref >39)
LDL Calculated: 39 mg/dL (ref 0–99)
Triglycerides: 118 mg/dL (ref 0–149)
VLDL Cholesterol Cal: 24 mg/dL (ref 5–40)

## 2019-06-08 ENCOUNTER — Ambulatory Visit (HOSPITAL_COMMUNITY)
Admission: RE | Admit: 2019-06-08 | Discharge: 2019-06-08 | Disposition: A | Discharge status: Home / Self Care | Payer: Medicare HMO | Source: Ambulatory Visit | Attending: Cardiovascular Disease | Admitting: Cardiovascular Disease

## 2019-06-08 ENCOUNTER — Other Ambulatory Visit: Payer: Self-pay

## 2019-06-08 DIAGNOSIS — E785 Hyperlipidemia, unspecified: Secondary | ICD-10-CM | POA: Diagnosis not present

## 2019-06-08 DIAGNOSIS — I6523 Occlusion and stenosis of bilateral carotid arteries: Secondary | ICD-10-CM | POA: Diagnosis not present

## 2019-06-08 DIAGNOSIS — I2510 Atherosclerotic heart disease of native coronary artery without angina pectoris: Secondary | ICD-10-CM | POA: Diagnosis not present

## 2019-06-21 ENCOUNTER — Other Ambulatory Visit: Payer: Medicare HMO

## 2019-06-21 ENCOUNTER — Other Ambulatory Visit: Payer: Self-pay

## 2019-06-30 ENCOUNTER — Other Ambulatory Visit: Payer: Medicare HMO

## 2019-06-30 ENCOUNTER — Ambulatory Visit (INDEPENDENT_AMBULATORY_CARE_PROVIDER_SITE_OTHER): Payer: Medicare HMO

## 2019-06-30 ENCOUNTER — Other Ambulatory Visit: Payer: Self-pay

## 2019-06-30 DIAGNOSIS — D696 Thrombocytopenia, unspecified: Secondary | ICD-10-CM | POA: Diagnosis not present

## 2019-06-30 DIAGNOSIS — Z23 Encounter for immunization: Secondary | ICD-10-CM | POA: Diagnosis not present

## 2019-06-30 DIAGNOSIS — E118 Type 2 diabetes mellitus with unspecified complications: Secondary | ICD-10-CM

## 2019-06-30 DIAGNOSIS — R7303 Prediabetes: Secondary | ICD-10-CM

## 2019-06-30 NOTE — Progress Notes (Signed)
Patient came in today to receive his annual flu shot. Patient received fluad in the left deltoid. Patient tolerated well. VIS given.

## 2019-07-02 LAB — COMPREHENSIVE METABOLIC PANEL
AG Ratio: 2.4 (calc) (ref 1.0–2.5)
ALT: 15 U/L (ref 9–46)
AST: 15 U/L (ref 10–35)
Albumin: 4 g/dL (ref 3.6–5.1)
Alkaline phosphatase (APISO): 52 U/L (ref 35–144)
BUN: 17 mg/dL (ref 7–25)
CO2: 23 mmol/L (ref 20–32)
Calcium: 8.6 mg/dL (ref 8.6–10.3)
Chloride: 106 mmol/L (ref 98–110)
Creat: 0.95 mg/dL (ref 0.70–1.11)
Globulin: 1.7 g/dL (calc) — ABNORMAL LOW (ref 1.9–3.7)
Glucose, Bld: 150 mg/dL — ABNORMAL HIGH (ref 65–99)
Potassium: 4 mmol/L (ref 3.5–5.3)
Sodium: 141 mmol/L (ref 135–146)
Total Bilirubin: 0.7 mg/dL (ref 0.2–1.2)
Total Protein: 5.7 g/dL — ABNORMAL LOW (ref 6.1–8.1)

## 2019-07-02 LAB — CBC WITH DIFFERENTIAL/PLATELET
Absolute Monocytes: 530 cells/uL (ref 200–950)
Basophils Absolute: 41 cells/uL (ref 0–200)
Basophils Relative: 0.6 %
Eosinophils Absolute: 190 cells/uL (ref 15–500)
Eosinophils Relative: 2.8 %
HCT: 42.5 % (ref 38.5–50.0)
Hemoglobin: 14.4 g/dL (ref 13.2–17.1)
Lymphs Abs: 1353 cells/uL (ref 850–3900)
MCH: 30.1 pg (ref 27.0–33.0)
MCHC: 33.9 g/dL (ref 32.0–36.0)
MCV: 88.7 fL (ref 80.0–100.0)
MPV: 11.3 fL (ref 7.5–12.5)
Monocytes Relative: 7.8 %
Neutro Abs: 4685 cells/uL (ref 1500–7800)
Neutrophils Relative %: 68.9 %
Platelets: 78 10*3/uL — ABNORMAL LOW (ref 140–400)
RBC: 4.79 10*6/uL (ref 4.20–5.80)
RDW: 12.5 % (ref 11.0–15.0)
Total Lymphocyte: 19.9 %
WBC: 6.8 10*3/uL (ref 3.8–10.8)

## 2019-07-02 LAB — HEMOGLOBIN A1C
Hgb A1c MFr Bld: 6.7 % of total Hgb — ABNORMAL HIGH (ref <5.7)
Mean Plasma Glucose: 146 (calc)
eAG (mmol/L): 8.1 (calc)

## 2019-07-04 ENCOUNTER — Telehealth: Payer: Self-pay | Admitting: Family Medicine

## 2019-07-04 NOTE — Telephone Encounter (Signed)
Patient called in today stating that he started having blood in his stools last night. This morning he states that stool was pure blood. He denies any abdominal pain and fever but does have c/o lower back pain, and fatigue. Advised him to go to the ER with fatigue and blood stools. Patient refused to go to ED stating he does not want to contract COVID . Advised him that we do not have any appointment today. Patient requested that I send a message to PCP to see what is recommended

## 2019-07-04 NOTE — Telephone Encounter (Signed)
Spoke with pt and he refuses to go to ED. I advised pt to stop aspirin. Pt states he had taken an aspirin at lunch and that he will not take anymore. Pt wanted an appt asap. First available was 09/23 with Dr. Buelah Manis. Pt was fine with that.

## 2019-07-04 NOTE — Telephone Encounter (Signed)
He needs to go to the ER if he is passing large amounts of pure blood.  May need transfusion.  Stop aspirin. If passing small amount of blood, needs colonoscopy through GI and stop aspirin.

## 2019-07-05 ENCOUNTER — Encounter: Payer: Self-pay | Admitting: Family Medicine

## 2019-07-05 ENCOUNTER — Ambulatory Visit (INDEPENDENT_AMBULATORY_CARE_PROVIDER_SITE_OTHER): Payer: Medicare HMO | Admitting: Family Medicine

## 2019-07-05 ENCOUNTER — Other Ambulatory Visit: Payer: Self-pay

## 2019-07-05 VITALS — BP 106/52 | HR 82 | Temp 98.4°F | Resp 14 | Ht 69.0 in | Wt 159.0 lb

## 2019-07-05 DIAGNOSIS — I959 Hypotension, unspecified: Secondary | ICD-10-CM | POA: Diagnosis not present

## 2019-07-05 DIAGNOSIS — D696 Thrombocytopenia, unspecified: Secondary | ICD-10-CM

## 2019-07-05 DIAGNOSIS — R197 Diarrhea, unspecified: Secondary | ICD-10-CM | POA: Diagnosis not present

## 2019-07-05 DIAGNOSIS — K529 Noninfective gastroenteritis and colitis, unspecified: Secondary | ICD-10-CM

## 2019-07-05 NOTE — Patient Instructions (Addendum)
We will call with lab results  Take 1/2 tablet of the metoprolol for your blood pressure for 1 week Glucerna or low sugar BOOST/ENSURE drink one a day to help your protein  We are checking the platelets again  F/U pending results

## 2019-07-05 NOTE — Assessment & Plan Note (Addendum)
He has history of thrombocytopenia.  However 78,000 is the lowest that he has had.  We will recheck his labs.  Not have any further active bleeding.  He feels well.  His blood pressure is a little on the low side we will have him decrease his metoprolol to 12.5 mg for the next week and increase his fluid intake.  I did advise him if he has significant anemia on his labs or he is in renal failure would recommend that he go to the emergency room to be treated.  He is quite adamant that he is not 1 to go to the ER for anything.  Also discussed that if his platelets continue to decline he should be seen by hematologist this is also something he would prefer not to do at this time.  I reviewed his labs in detail with him recently.  I think that he most likely had a gastroenteritis.  His symptoms resolved quite quickly for a typical diverticulitis flare though he does have diverticular on colonoscopy which I also reviewed with him from last year.  I am not can prescribe any new medications at this time.

## 2019-07-05 NOTE — Progress Notes (Signed)
Subjective:    Patient ID: Andrew [name], male    DOB: September 14, 1941, 78 y.o.   MRN: [id number]  Patient presents for Diarrhea (x12 hours- frequent loose stools with copious amounts of bright red blood noted in stool)  Patient here to discuss his diarrhea blood in the stool however he had an entire list with multiple brand of questions.   Monday night started with diarrhea, he went 10 times, but the the last time which was Tuesday AM had large amount of BRBPR  He has had episodes before but not as much bleeding   He did have a little back pain yesterday, but no fever, no vomiting  No BM since yesterday evening He did take 1 immodium early Monday morning and took tylenol  He was up twice last night to urinate At the end of the visit he tells me that his daughter ate the same thing he had eaten the day before she also became sick with diarrhea.   He ate banana sandwhich and milk shake yesterday  today, had egg and lean [place] He currently feels fine does not feel weak.    He did stop the ASA yesterday, when he had the bleeding     Reviewed labs he had low plt of  78,000 last week however his platelets have ranged from 90-120,000 in the past few years.    Colonoscopy report December 2019  Stool in the entire examined colon leading to several minutes spent lavaging to obtain adequate views. - Two 3 to 4 mm polyps in the ascending colon, removed with a cold snare. Resected and retrieved. - Two diminutive polyps in the ascending colon, removed with a cold biopsy forceps. Resected and retrieved. - One 3 mm polyp in the transverse colon, removed with a cold snare. Resected and retrieved. - One diminutive polyp in the sigmoid colon, removed with a cold biopsy forceps. Resected and retrieved. - A single colonic angiodysplastic lesion. - Diverticulosis in the sigmoid colon. - Internal hemorrhoids. - The examination was otherwise normal. - Biopsies were taken with a cold forceps  from  He had questions about COVID-19 questions about flu shots we also reviewed his recent labs in detail  Review Of Systems:  GEN- denies fatigue, fever, weight loss,weakness, recent illness HEENT- denies eye drainage, change in vision, nasal discharge, CVS- denies chest pain, palpitations RESP- denies SOB, cough, wheeze ABD- denies N/V,+ change in stools, abd pain GU- denies dysuria, hematuria, dribbling, incontinence MSK- denies joint pain, muscle aches, injury Neuro- denies headache, dizziness, syncope, seizure activity       Objective:    BP (!) 106/52 (BP Location: Right Arm, Patient Position: Sitting, Cuff Size: Normal)   Pulse 82   Temp 98.4 F (36.9 C) (Oral)   Resp 14   Ht 5\' 9"  (1.753 m)   Wt 159 lb (72.1 kg)   SpO2 98%   BMI 23.48 kg/m  GEN- NAD, alert and oriented x3 HEENT- PERRL, EOMI, non injected sclera, pink conjunctiva, MMM, oropharynx clear Neck- Supple, no thyromegaly CVS- RRR, no murmur RESP-CTAB ABD-NABS,soft,NT,ND, no CVA tenderness EXT- No edema Pulses- Radial2+        Assessment & Plan:      Problem List Items Addressed This Visit      Unprioritized   Thrombocytopenia ([place]) - Primary    He has history of thrombocytopenia.  However 78,000 is the lowest that he has had.  We will recheck his labs.  Not have any further active bleeding.  He feels well.  His blood pressure is a little on the low side we will have him decrease his metoprolol to 12.5 mg for the next week and increase his fluid intake.  I did advise him if he has significant anemia on his labs or he is in renal failure would recommend that he go to the emergency room to be treated.  He is quite adamant that he is not 1 to go to the ER for anything.  Also discussed that if his platelets continue to decline he should be seen by hematologist this is also something he would prefer not to do at this time.  I reviewed his labs in detail with him recently.  I think that he most likely had  a gastroenteritis.  His symptoms resolved quite quickly for a typical diverticulitis flare though he does have diverticular on colonoscopy which I also reviewed with him from last year.  I am not can prescribe any new medications at this time.        Relevant Orders   CBC with Differential/Platelet   Comprehensive metabolic panel    Other Visit Diagnoses    Bloody diarrhea       Relevant Orders   Comprehensive metabolic panel   Gastroenteritis       Hypotension, unspecified hypotension type       mix of beta blocker and mild dehyration, decrease temporarily to 12.5mg  daily      Note: This dictation was prepared with Dragon dictation along with smaller phrase technology. Any transcriptional errors that result from this process are unintentional.

## 2019-07-06 LAB — CBC WITH DIFFERENTIAL/PLATELET
Absolute Monocytes: 757 cells/uL (ref 200–950)
Basophils Absolute: 18 cells/uL (ref 0–200)
Basophils Relative: 0.2 %
Eosinophils Absolute: 329 cells/uL (ref 15–500)
Eosinophils Relative: 3.7 %
HCT: 42 % (ref 38.5–50.0)
Hemoglobin: 14.2 g/dL (ref 13.2–17.1)
Lymphs Abs: 1184 cells/uL (ref 850–3900)
MCH: 29.8 pg (ref 27.0–33.0)
MCHC: 33.8 g/dL (ref 32.0–36.0)
MCV: 88.1 fL (ref 80.0–100.0)
MPV: 11.2 fL (ref 7.5–12.5)
Monocytes Relative: 8.5 %
Neutro Abs: 6613 cells/uL (ref 1500–7800)
Neutrophils Relative %: 74.3 %
Platelets: 103 10*3/uL — ABNORMAL LOW (ref 140–400)
RBC: 4.77 10*6/uL (ref 4.20–5.80)
RDW: 12.1 % (ref 11.0–15.0)
Total Lymphocyte: 13.3 %
WBC: 8.9 10*3/uL (ref 3.8–10.8)

## 2019-07-06 LAB — COMPREHENSIVE METABOLIC PANEL
AG Ratio: 2.1 (calc) (ref 1.0–2.5)
ALT: 16 U/L (ref 9–46)
AST: 19 U/L (ref 10–35)
Albumin: 3.6 g/dL (ref 3.6–5.1)
Alkaline phosphatase (APISO): 69 U/L (ref 35–144)
BUN: 15 mg/dL (ref 7–25)
CO2: 25 mmol/L (ref 20–32)
Calcium: 8.5 mg/dL — ABNORMAL LOW (ref 8.6–10.3)
Chloride: 106 mmol/L (ref 98–110)
Creat: 0.84 mg/dL (ref 0.70–1.11)
Globulin: 1.7 g/dL (calc) — ABNORMAL LOW (ref 1.9–3.7)
Glucose, Bld: 157 mg/dL — ABNORMAL HIGH (ref 65–99)
Potassium: 3.7 mmol/L (ref 3.5–5.3)
Sodium: 139 mmol/L (ref 135–146)
Total Bilirubin: 0.8 mg/dL (ref 0.2–1.2)
Total Protein: 5.3 g/dL — ABNORMAL LOW (ref 6.1–8.1)

## 2019-07-11 ENCOUNTER — Ambulatory Visit: Payer: Medicare HMO

## 2019-07-11 ENCOUNTER — Telehealth: Payer: Self-pay | Admitting: Family Medicine

## 2019-07-11 ENCOUNTER — Other Ambulatory Visit: Payer: Self-pay

## 2019-07-11 VITALS — BP 118/66

## 2019-07-11 DIAGNOSIS — Z0130 Encounter for examination of blood pressure without abnormal findings: Secondary | ICD-10-CM

## 2019-07-11 NOTE — Telephone Encounter (Signed)
Spoke with patient and informed him to continue on 1/2 tablet on metoprolol. Patient verbalized understanding.

## 2019-07-11 NOTE — Progress Notes (Signed)
Patient was in for blood pressure check. BP was 118/66 at rest. He is currently taking he is currently taking 12.5 mg of metoprolol since seeing Dr. Buelah Manis on 07/05/2019. Message to be sent to Dr. Dennard Schaumann to see if he would like patient to continue 12.5 mg of metoprolol.

## 2019-07-11 NOTE — Telephone Encounter (Signed)
Patient was in for blood pressure check. BP was 118/66 at rest. He is currently taking he is currently taking 12.5 mg of metoprolol since seeing Dr. Buelah Manis on 07/05/2019.Would you like for patient to continue on 12.5 metoprolol?

## 2019-07-11 NOTE — Telephone Encounter (Signed)
yes

## 2019-07-11 NOTE — Telephone Encounter (Signed)
Left message return call

## 2019-07-12 ENCOUNTER — Emergency Department (HOSPITAL_COMMUNITY): Payer: Medicare HMO

## 2019-07-12 ENCOUNTER — Encounter (HOSPITAL_COMMUNITY): Payer: Self-pay

## 2019-07-12 ENCOUNTER — Emergency Department (HOSPITAL_COMMUNITY)
Admission: EM | Admit: 2019-07-12 | Discharge: 2019-07-12 | Disposition: A | Discharge status: Home / Self Care | Payer: Medicare HMO | Attending: Emergency Medicine | Admitting: Emergency Medicine

## 2019-07-12 ENCOUNTER — Other Ambulatory Visit: Payer: Self-pay

## 2019-07-12 ENCOUNTER — Telehealth: Payer: Self-pay | Admitting: Family Medicine

## 2019-07-12 DIAGNOSIS — Z7982 Long term (current) use of aspirin: Secondary | ICD-10-CM | POA: Insufficient documentation

## 2019-07-12 DIAGNOSIS — R197 Diarrhea, unspecified: Secondary | ICD-10-CM | POA: Insufficient documentation

## 2019-07-12 DIAGNOSIS — Z79899 Other long term (current) drug therapy: Secondary | ICD-10-CM | POA: Insufficient documentation

## 2019-07-12 DIAGNOSIS — I129 Hypertensive chronic kidney disease with stage 1 through stage 4 chronic kidney disease, or unspecified chronic kidney disease: Secondary | ICD-10-CM | POA: Diagnosis not present

## 2019-07-12 DIAGNOSIS — Z951 Presence of aortocoronary bypass graft: Secondary | ICD-10-CM | POA: Insufficient documentation

## 2019-07-12 DIAGNOSIS — N189 Chronic kidney disease, unspecified: Secondary | ICD-10-CM | POA: Diagnosis not present

## 2019-07-12 DIAGNOSIS — I2510 Atherosclerotic heart disease of native coronary artery without angina pectoris: Secondary | ICD-10-CM | POA: Insufficient documentation

## 2019-07-12 DIAGNOSIS — R1030 Lower abdominal pain, unspecified: Secondary | ICD-10-CM | POA: Diagnosis not present

## 2019-07-12 DIAGNOSIS — I252 Old myocardial infarction: Secondary | ICD-10-CM | POA: Diagnosis not present

## 2019-07-12 DIAGNOSIS — Z20828 Contact with and (suspected) exposure to other viral communicable diseases: Secondary | ICD-10-CM | POA: Insufficient documentation

## 2019-07-12 DIAGNOSIS — R110 Nausea: Secondary | ICD-10-CM | POA: Diagnosis not present

## 2019-07-12 LAB — COMPREHENSIVE METABOLIC PANEL
ALT: 15 U/L (ref 0–44)
AST: 16 U/L (ref 15–41)
Albumin: 3.4 g/dL — ABNORMAL LOW (ref 3.5–5.0)
Alkaline Phosphatase: 83 U/L (ref 38–126)
Anion gap: 6 (ref 5–15)
BUN: 19 mg/dL (ref 8–23)
CO2: 23 mmol/L (ref 22–32)
Calcium: 8.2 mg/dL — ABNORMAL LOW (ref 8.9–10.3)
Chloride: 108 mmol/L (ref 98–111)
Creatinine, Ser: 0.8 mg/dL (ref 0.61–1.24)
GFR calc Af Amer: >60 mL/min (ref >60)
GFR calc non Af Amer: >60 mL/min (ref >60)
Glucose, Bld: 145 mg/dL — ABNORMAL HIGH (ref 70–99)
Potassium: 3.6 mmol/L (ref 3.5–5.1)
Sodium: 137 mmol/L (ref 135–145)
Total Bilirubin: 0.7 mg/dL (ref 0.3–1.2)
Total Protein: 6.1 g/dL — ABNORMAL LOW (ref 6.5–8.1)

## 2019-07-12 LAB — CBC WITH DIFFERENTIAL/PLATELET
Abs Immature Granulocytes: 0.06 10*3/uL (ref 0.00–0.07)
Basophils Absolute: 0 10*3/uL (ref 0.0–0.1)
Basophils Relative: 0 %
Eosinophils Absolute: 1.5 10*3/uL — ABNORMAL HIGH (ref 0.0–0.5)
Eosinophils Relative: 13 %
HCT: 44.8 % (ref 39.0–52.0)
Hemoglobin: 14.5 g/dL (ref 13.0–17.0)
Immature Granulocytes: 1 %
Lymphocytes Relative: 14 %
Lymphs Abs: 1.6 10*3/uL (ref 0.7–4.0)
MCH: 29.4 pg (ref 26.0–34.0)
MCHC: 32.4 g/dL (ref 30.0–36.0)
MCV: 90.9 fL (ref 80.0–100.0)
Monocytes Absolute: 0.6 10*3/uL (ref 0.1–1.0)
Monocytes Relative: 5 %
Neutro Abs: 7.9 10*3/uL — ABNORMAL HIGH (ref 1.7–7.7)
Neutrophils Relative %: 67 %
Platelets: 192 10*3/uL (ref 150–400)
RBC: 4.93 MIL/uL (ref 4.22–5.81)
RDW: 12.9 % (ref 11.5–15.5)
WBC: 11.7 10*3/uL — ABNORMAL HIGH (ref 4.0–10.5)
nRBC: 0 % (ref 0.0–0.2)

## 2019-07-12 LAB — URINALYSIS, ROUTINE W REFLEX MICROSCOPIC
Bilirubin Urine: NEGATIVE
Glucose, UA: NEGATIVE mg/dL
Hgb urine dipstick: NEGATIVE
Ketones, ur: NEGATIVE mg/dL
Leukocytes,Ua: NEGATIVE
Nitrite: NEGATIVE
Protein, ur: NEGATIVE mg/dL
Specific Gravity, Urine: 1.014 (ref 1.005–1.030)
pH: 5 (ref 5.0–8.0)

## 2019-07-12 LAB — LIPASE, BLOOD: Lipase: 14 U/L (ref 11–51)

## 2019-07-12 MED ORDER — IOHEXOL 300 MG/ML  SOLN
100.0000 mL | Freq: Once | INTRAMUSCULAR | Status: AC | PRN
  Administered 2019-07-12: 100 mL via INTRAVENOUS

## 2019-07-12 MED ORDER — SODIUM CHLORIDE 0.9 % IV BOLUS
500.0000 mL | Freq: Once | INTRAVENOUS | Status: AC
  Administered 2019-07-12: 18:00:00 500 mL via INTRAVENOUS

## 2019-07-12 MED ORDER — SODIUM CHLORIDE 0.9 % IV SOLN
INTRAVENOUS | Status: DC
  Administered 2019-07-12: 18:00:00 via INTRAVENOUS

## 2019-07-12 MED ORDER — LOPERAMIDE HCL 2 MG PO CAPS: 2.0000 mg | ORAL_CAPSULE | Freq: Four times a day (QID) | ORAL | 0 refills | Status: DC | PRN

## 2019-07-12 NOTE — Discharge Instructions (Signed)
Take the antidiarrhea medicine as directed.  As you requested we have done outpatient COVID testing.  Symptoms not classic for it.  CT scan of the abdomen showed no inflammation in the large or small intestines.  No acute abnormalities.  Labs without significant abnormalities.  Return for fever or any new or worse symptoms.  Recommend that she make an appointment follow-up with your doctor.

## 2019-07-12 NOTE — ED Provider Notes (Signed)
[facility] EMERGENCY DEPARTMENT Provider Note   CSN: [id number] Arrival date & time: 07/12/19  1347     History   Chief Complaint Chief Complaint  Patient presents with  . Diarrhea    HPI Andrew [name] is a 78 y.o. male.     Patient with a complaint of diarrhea starting yesterday.  Not black or bloody.  To turn green today.  Multiple episodes.  Associated with some mild lower abdominal cramping.  No fever.  No upper respiratory symptoms.  No sick exposures.  Some nausea but no vomiting.     Past Medical History:  Diagnosis Date  . Allergy    grass etc.  . Barrett's esophagus   . BPH (benign prostatic hyperplasia)   . Chest pain, atypical   . Chronic kidney disease    kidney stones  . Coronary artery disease   . GERD (gastroesophageal reflux disease)    Barrett's  . Hernia   . Hyperlipidemia   . Hypertension   . Myocardial infarction ([place])   . Thrombocytopenia ([place])   . Ulcer 1960    Patient Active Problem List   Diagnosis Date Noted  . Thrombocytopenia ([place])   . Bilateral carotid artery disease ([place]) 08/04/2016  . Poor posture 02/22/2014  . Stiffness of joints, not elsewhere classified, multiple sites 02/22/2014  . Prediabetes 01/05/2013  . Special screening for malignant neoplasms, colon 04/27/2011  . Benign neoplasm of colon 04/27/2011  . GERD (gastroesophageal reflux disease) 04/27/2011  . Diverticulosis of colon (without mention of hemorrhage) 04/27/2011  . CAD (coronary artery disease) 12/27/2010  . Environmental allergies 12/27/2010  . Insomnia 12/27/2010  . H. pylori infection 12/27/2010  . Barrett's esophagus 12/27/2010  . Hyperlipidemia 11/08/2008  . HTN (hypertension) 11/08/2008  . GERD 11/08/2008  . ACUT PEPTC ULCR UNS SITE W/HEM W/O MENTION OBST 11/08/2008  . RENAL CALCULUS 11/08/2008  . Obstructive sleep apnea 04/12/2003    Past Surgical History:  Procedure Laterality Date  . CARDIAC CATHETERIZATION  02/14/2007   MITRAL  REGURGITATION. LV NORMAL  . CHOLECYSTECTOMY    . COLONOSCOPY    . CORONARY ARTERY BYPASS GRAFT     twice  2 by passes each time  . INGUINAL HERNIA REPAIR     bilateral done twice  . LITHOTRIPSY    . UPPER GASTROINTESTINAL ENDOSCOPY          Home Medications    Prior to Admission medications   Medication Sig Start Date End Date Taking? Authorizing Provider  aspirin 81 MG tablet Take 81 mg by mouth daily.     Yes [provider]  Cholecalciferol (VITAMIN D) 2000 units CAPS Take 1 capsule by mouth daily.   Yes [provider]  lansoprazole (PREVACID) 30 MG capsule Take 1 capsule (30 mg total) by mouth daily. Patient taking differently: Take 30 mg by mouth every morning.  10/18/18  Yes Susy Frizzle, MD  loperamide (IMODIUM A-D) 2 MG tablet Take 2 mg by mouth 4 (four) times daily as needed for diarrhea or loose stools.   Yes [provider]  metoprolol succinate (TOPROL-XL) 25 MG 24 hr tablet Take 1 tablet (25 mg total) by mouth daily. 10/18/18 10/18/19 Yes Susy Frizzle, MD  Multiple Vitamin (MULTIVITAMIN) tablet Take 1 tablet by mouth daily.     Yes [provider]  Omega-3 Fatty Acids (FISH OIL) 1000 MG CPDR Take 1,000 mg by mouth every morning.    Yes [provider]  simvastatin (ZOCOR) 10  MG tablet Take 1 tablet (10 mg total) by mouth at bedtime. 10/18/18  Yes Susy Frizzle, MD  tamsulosin (FLOMAX) 0.4 MG CAPS capsule Take 0.4 mg by mouth at bedtime.   Yes [provider]  finasteride (PROSCAR) 5 MG tablet TAKE 1 TABLET(5 MG) BY MOUTH DAILY. Patient not taking: Reported on 07/12/2019 04/17/19   Susy Frizzle, MD  loperamide (IMODIUM) 2 MG capsule Take 1 capsule (2 mg total) by mouth 4 (four) times daily as needed for diarrhea or loose stools. 07/12/19   Fredia Sorrow, MD  nitroGLYCERIN (NITROSTAT) 0.4 MG SL tablet Place 1 tablet (0.4 mg total) under the tongue every 5 (five) minutes as needed. 04/17/19   Susy Frizzle, MD     Family History Family History  Problem Relation Age of Onset  . Heart disease Father   . Heart attack Mother   . Colon cancer Neg Hx   . Esophageal cancer Neg Hx   . Rectal cancer Neg Hx   . Stomach cancer Neg Hx     Social History Social History   Tobacco Use  . Smoking status: Never Smoker  . Smokeless tobacco: Never Used  Substance Use Topics  . Alcohol use: No  . Drug use: No     Allergies   Imdur [isosorbide mononitrate] and Meloxicam   Review of Systems Review of Systems  Constitutional: Negative for chills and fever.  HENT: Negative for congestion, rhinorrhea and sore throat.   Eyes: Negative for visual disturbance.  Respiratory: Negative for cough and shortness of breath.   Cardiovascular: Negative for chest pain and leg swelling.  Gastrointestinal: Positive for abdominal pain, diarrhea and nausea. Negative for blood in stool and vomiting.  Genitourinary: Negative for dysuria.  Musculoskeletal: Negative for back pain and neck pain.  Skin: Negative for rash.  Neurological: Negative for dizziness, light-headedness and headaches.  Hematological: Does not bruise/bleed easily.  Psychiatric/Behavioral: Negative for confusion.     Physical Exam Updated Vital Signs BP 122/67   Pulse 71   Temp 98.3 F (36.8 C) (Oral)   Resp 18   Ht 1.753 m (5\' 9" )   Wt 72.6 kg   SpO2 97%   BMI 23.63 kg/m   Physical Exam Vitals signs and nursing note reviewed.  Constitutional:      Appearance: He is well-developed.  HENT:     Head: Normocephalic and atraumatic.  Eyes:     Conjunctiva/sclera: Conjunctivae normal.     Pupils: Pupils are equal, round, and reactive to light.  Neck:     Musculoskeletal: Normal range of motion and neck supple.  Cardiovascular:     Rate and Rhythm: Normal rate and regular rhythm.     Heart sounds: No murmur.  Pulmonary:     Effort: Pulmonary effort is normal. No respiratory distress.     Breath sounds: Normal breath sounds.   Abdominal:     Palpations: Abdomen is soft.     Tenderness: There is no abdominal tenderness.  Musculoskeletal:        General: No swelling.  Skin:    General: Skin is warm and dry.     Capillary Refill: Capillary refill takes less than 2 seconds.  Neurological:     General: No focal deficit present.     Mental Status: He is alert and oriented to person, place, and time.      ED Treatments / Results  Labs (all labs ordered are listed, but only abnormal results are displayed) Labs Reviewed  COMPREHENSIVE METABOLIC PANEL - Abnormal; Notable for the following components:      Result Value   Glucose, Bld 145 (*)    Calcium 8.2 (*)    Total Protein 6.1 (*)    Albumin 3.4 (*)    All other components within normal limits  CBC WITH DIFFERENTIAL/PLATELET - Abnormal; Notable for the following components:   WBC 11.7 (*)    Neutro Abs 7.9 (*)    Eosinophils Absolute 1.5 (*)    All other components within normal limits  C DIFFICILE QUICK SCREEN W PCR REFLEX  NOVEL CORONAVIRUS, NAA (HOSP ORDER, SEND-OUT TO REF LAB; TAT 18-24 HRS)  LIPASE, BLOOD  URINALYSIS, ROUTINE W REFLEX MICROSCOPIC  GI PATHOGEN PANEL BY PCR, STOOL    EKG None  Radiology Ct Abdomen Pelvis W Contrast  Result Date: 07/12/2019 CLINICAL DATA:  Severe abdominal pain and diarrhea beginning yesterday. Nausea and diaphoresis. EXAM: CT ABDOMEN AND PELVIS WITH CONTRAST TECHNIQUE: Multidetector CT imaging of the abdomen and pelvis was performed using the standard protocol following bolus administration of intravenous contrast. CONTRAST:  116mL OMNIPAQUE IOHEXOL 300 MG/ML  SOLN COMPARISON:  05/15/2010 FINDINGS: Lower Chest: No acute findings. Hepatobiliary: No hepatic masses identified. Prior cholecystectomy. No evidence of biliary obstruction. Pancreas:  No mass or inflammatory changes. Spleen: Within normal limits in size and appearance. Adrenals/Urinary Tract: No masses identified. No evidence of ureteral calculi or  hydronephrosis. Stomach/Bowel: No evidence of obstruction, inflammatory process or abnormal fluid collections. Normal appendix visualized. Diverticulosis is seen mainly involving the descending and sigmoid colon, however there is no evidence of diverticulitis. Vascular/Lymphatic: No pathologically enlarged lymph nodes. No abdominal aortic aneurysm. Aortic atherosclerosis. Reproductive:  Stable moderately enlarged prostate. Other:  None. Musculoskeletal:  No suspicious bone lesions identified. IMPRESSION: Colonic diverticulosis, without radiographic evidence of diverticulitis or other acute findings. Stable moderately enlarged prostate. Aortic Atherosclerosis (ICD10-I70.0). Electronically Signed   By: Marlaine Hind M.D.   On: 07/12/2019 19:01    Procedures Procedures (including critical care time)  Medications Ordered in ED Medications  0.9 %  sodium chloride infusion ( Intravenous New Bag/Given 07/12/19 1744)  sodium chloride 0.9 % bolus 500 mL (0 mLs Intravenous Stopped 07/12/19 1815)  iohexol (OMNIPAQUE) 300 MG/ML solution 100 mL (100 mLs Intravenous Contrast Given 07/12/19 1836)     Initial Impression / Assessment and Plan / ED Course  I have reviewed the triage vital signs and the nursing notes.  Pertinent labs & imaging results that were available during my care of the patient were reviewed by me and considered in my medical decision making (see chart for details).        Labs and CT scan of the abdomen without any significant abnormalities.  No evidence of enteritis or colitis.  Patient not able to provide stool sample.  Will treat with Imodium have him follow-up with primary care doctor return for any new or worse symptoms.  Patient did want to have outpatient testing done for COVID-19 infection.  Symptoms are not classic.  But ordered outpatient test.  Final Clinical Impressions(s) / ED Diagnoses   Final diagnoses:  Diarrhea, unspecified type    ED Discharge Orders          Ordered    loperamide (IMODIUM) 2 MG capsule  4 times daily PRN     07/12/19 2009           Fredia Sorrow, MD 07/12/19 2013

## 2019-07-12 NOTE — Telephone Encounter (Signed)
Pt called sounded very winded on phone states that he is having severe diarrhea and has taken 3 dose of imodium already with out relief of diarrhea. He has been up all night running to the bathroom. While on phone he stated that he feels like he is going to pass out, weak, sweaty, and nauseated. I recommended that he go to the ER for evaluation. Pt is very reluctant to go to the ER but will go. He will have his wife to carry him.

## 2019-07-12 NOTE — ED Triage Notes (Signed)
Pt reports diarrhea since yesterday since 10 am. Pt reports he has had 15 BMs. The last 2 has been green in color

## 2019-07-14 LAB — NOVEL CORONAVIRUS, NAA (HOSP ORDER, SEND-OUT TO REF LAB; TAT 18-24 HRS): SARS-CoV-2, NAA: NOT DETECTED

## 2019-07-17 ENCOUNTER — Telehealth: Payer: Self-pay | Admitting: Family Medicine

## 2019-07-17 NOTE — Telephone Encounter (Signed)
Patient calling regarding covid results  [phone]

## 2019-07-17 NOTE — Telephone Encounter (Signed)
Pt aware of negative results.

## 2019-07-18 ENCOUNTER — Ambulatory Visit: Payer: Medicare HMO

## 2019-07-18 ENCOUNTER — Other Ambulatory Visit: Payer: Self-pay

## 2019-07-18 VITALS — BP 136/68

## 2019-07-18 DIAGNOSIS — Z0130 Encounter for examination of blood pressure without abnormal findings: Secondary | ICD-10-CM

## 2019-07-18 NOTE — Progress Notes (Signed)
Patient came in for a blood pressure check. Patient's BP today was 136/68. Advised patient to return at any time to have his BP checked again. Patient verbalized understanding.

## 2019-07-20 ENCOUNTER — Ambulatory Visit (INDEPENDENT_AMBULATORY_CARE_PROVIDER_SITE_OTHER): Payer: Medicare HMO | Admitting: Family Medicine

## 2019-07-20 ENCOUNTER — Other Ambulatory Visit: Payer: Self-pay

## 2019-07-20 ENCOUNTER — Encounter: Payer: Self-pay | Admitting: Family Medicine

## 2019-07-20 VITALS — BP 132/70 | HR 72 | Temp 97.7°F | Resp 12 | Ht 69.0 in | Wt 156.0 lb

## 2019-07-20 DIAGNOSIS — K580 Irritable bowel syndrome with diarrhea: Secondary | ICD-10-CM | POA: Diagnosis not present

## 2019-07-20 NOTE — Progress Notes (Signed)
Subjective:    Patient ID: Andrew [name], male    DOB: May 11, 1941, 78 y.o.   MRN: [id number]  HPI Patient has been complaining of intermittent diarrhea off and on since last year.  He was referred to GI for a colonoscopy in December of last year which aside from some polyps revealed no evidence of any type of colitis or microscopic colitis.  Usually he goes 3-4 times a day.  He is found no specific trigger.  Recently he states that he had a day where he was having 14+ episodes of diarrhea prompting him to go to the emergency room.  CT scan obtained in the emergency room was normal.  Lab work was normal aside from a mildly elevated white blood cell count.  Patient was discharged home and has been taking Pepto-Bismol occasionally.  He states in the last 3 days he has had 1 bowel movement per day that has been normal.  He denies any blood in his stool.  He denies any melena.  He denies any nausea or vomiting.  He denies any fevers or chills or weight loss Past Medical History:  Diagnosis Date  . Allergy    grass etc.  . Barrett's esophagus   . BPH (benign prostatic hyperplasia)   . Chest pain, atypical   . Chronic kidney disease    kidney stones  . Coronary artery disease   . GERD (gastroesophageal reflux disease)    Barrett's  . Hernia   . Hyperlipidemia   . Hypertension   . Myocardial infarction ([place])   . Thrombocytopenia ([place])   . Ulcer 1960   Past Surgical History:  Procedure Laterality Date  . CARDIAC CATHETERIZATION  02/14/2007   MITRAL REGURGITATION. LV NORMAL  . CHOLECYSTECTOMY    . COLONOSCOPY    . CORONARY ARTERY BYPASS GRAFT     twice  2 by passes each time  . INGUINAL HERNIA REPAIR     bilateral done twice  . LITHOTRIPSY    . UPPER GASTROINTESTINAL ENDOSCOPY     Current Outpatient Medications on File Prior to Visit  Medication Sig Dispense Refill  . aspirin 81 MG tablet Take 81 mg by mouth daily.      . Cholecalciferol (VITAMIN D) 2000 units CAPS Take 1 capsule by  mouth daily.    . finasteride (PROSCAR) 5 MG tablet TAKE 1 TABLET(5 MG) BY MOUTH DAILY. (Patient not taking: Reported on 07/12/2019) 90 tablet 3  . lansoprazole (PREVACID) 30 MG capsule Take 1 capsule (30 mg total) by mouth daily. (Patient taking differently: Take 30 mg by mouth every morning. ) 90 capsule 3  . loperamide (IMODIUM A-D) 2 MG tablet Take 2 mg by mouth 4 (four) times daily as needed for diarrhea or loose stools.    [name] loperamide (IMODIUM) 2 MG capsule Take 1 capsule (2 mg total) by mouth 4 (four) times daily as needed for diarrhea or loose stools. 12 capsule 0  . metoprolol succinate (TOPROL-XL) 25 MG 24 hr tablet Take 1 tablet (25 mg total) by mouth daily. 90 tablet 3  . Multiple Vitamin (MULTIVITAMIN) tablet Take 1 tablet by mouth daily.      . nitroGLYCERIN (NITROSTAT) 0.4 MG SL tablet Place 1 tablet (0.4 mg total) under the tongue every 5 (five) minutes as needed. 75 tablet 3  . Omega-3 Fatty Acids (FISH OIL) 1000 MG CPDR Take 1,000 mg by mouth every morning.     . simvastatin (ZOCOR) 10 MG tablet Take 1 tablet (10  mg total) by mouth at bedtime. 90 tablet 3  . tamsulosin (FLOMAX) 0.4 MG CAPS capsule Take 0.4 mg by mouth at bedtime.     Current Facility-Administered Medications on File Prior to Visit  Medication Dose Route Frequency Provider Last Rate Last Dose  . 0.9 %  sodium chloride infusion  500 mL Intravenous Once Armbruster, Carlota Raspberry, MD       Allergies  Allergen Reactions  . Imdur [Isosorbide Mononitrate]     Unknown, per pt   . Meloxicam Swelling    Facial swelling    Social History   Socioeconomic History  . Marital status: Married    Spouse name: Not on file  . Number of children: 2  . Years of education: Not on file  . Highest education level: Not on file  Occupational History  . Occupation: Office manager  Social Needs  . Financial resource strain: Not on file  . Food insecurity    Worry: Not on file    Inability: Not on file  .  Transportation needs    Medical: Not on file    Non-medical: Not on file  Tobacco Use  . Smoking status: Never Smoker  . Smokeless tobacco: Never Used  Substance and Sexual Activity  . Alcohol use: No  . Drug use: No  . Sexual activity: Not on file  Lifestyle  . Physical activity    Days per week: Not on file    Minutes per session: Not on file  . Stress: Not on file  Relationships  . Social Herbalist on phone: Not on file    Gets together: Not on file    Attends religious service: Not on file    Active member of club or organization: Not on file    Attends meetings of clubs or organizations: Not on file    Relationship status: Not on file  . Intimate partner violence    Fear of current or ex partner: Not on file    Emotionally abused: Not on file    Physically abused: Not on file    Forced sexual activity: Not on file  Other Topics Concern  . Not on file  Social History Narrative  . Not on file      Review of Systems  All other systems reviewed and are negative.      Objective:   Physical Exam Vitals signs reviewed.  Constitutional:      Appearance: Normal appearance. He is normal weight.  Cardiovascular:     Rate and Rhythm: Normal rate and regular rhythm.     Heart sounds: Normal heart sounds. No murmur. No friction rub. No gallop.   Pulmonary:     Effort: Pulmonary effort is normal. No respiratory distress.     Breath sounds: Normal breath sounds. No stridor. No wheezing, rhonchi or rales.  Chest:     Chest wall: No tenderness.  Abdominal:     General: Bowel sounds are normal.     Palpations: Abdomen is soft.  Musculoskeletal:     Right lower leg: No edema.     Left lower leg: No edema.  Neurological:     Mental Status: He is alert.           Assessment & Plan:  Irritable bowel syndrome with diarrhea  I believe the patient has irritable bowel syndrome explaining his intermittent diarrhea.  He would like to discontinue lansoprazole  to see if this stops the diarrhea from happening.  I am fine with that.  However if he continues to have diarrhea, I would recommend trying Imodium 1 tablet a day as a "preventative" and experiment to try to determine the correct frequency and amount of Imodium to help regulate his diarrhea.  Patient is comfortable with this plan

## 2019-07-24 DIAGNOSIS — R351 Nocturia: Secondary | ICD-10-CM | POA: Diagnosis not present

## 2019-07-24 DIAGNOSIS — E291 Testicular hypofunction: Secondary | ICD-10-CM | POA: Diagnosis not present

## 2019-07-24 DIAGNOSIS — N403 Nodular prostate with lower urinary tract symptoms: Secondary | ICD-10-CM | POA: Diagnosis not present

## 2019-07-24 DIAGNOSIS — Z87442 Personal history of urinary calculi: Secondary | ICD-10-CM | POA: Diagnosis not present

## 2019-10-09 ENCOUNTER — Other Ambulatory Visit: Payer: Medicare HMO

## 2019-10-09 ENCOUNTER — Other Ambulatory Visit: Payer: Self-pay

## 2019-10-09 DIAGNOSIS — E118 Type 2 diabetes mellitus with unspecified complications: Secondary | ICD-10-CM

## 2019-10-09 DIAGNOSIS — I10 Essential (primary) hypertension: Secondary | ICD-10-CM | POA: Diagnosis not present

## 2019-10-10 ENCOUNTER — Encounter: Payer: Self-pay | Admitting: Family Medicine

## 2019-10-10 LAB — CBC WITH DIFFERENTIAL/PLATELET
Absolute Monocytes: 540 cells/uL (ref 200–950)
Basophils Absolute: 50 cells/uL (ref 0–200)
Basophils Relative: 0.7 %
Eosinophils Absolute: 108 cells/uL (ref 15–500)
Eosinophils Relative: 1.5 %
HCT: 43.3 % (ref 38.5–50.0)
Hemoglobin: 14.7 g/dL (ref 13.2–17.1)
Lymphs Abs: 1620 cells/uL (ref 850–3900)
MCH: 30.1 pg (ref 27.0–33.0)
MCHC: 33.9 g/dL (ref 32.0–36.0)
MCV: 88.5 fL (ref 80.0–100.0)
MPV: 11 fL (ref 7.5–12.5)
Monocytes Relative: 7.5 %
Neutro Abs: 4882 cells/uL (ref 1500–7800)
Neutrophils Relative %: 67.8 %
RBC: 4.89 10*6/uL (ref 4.20–5.80)
RDW: 11.9 % (ref 11.0–15.0)
Total Lymphocyte: 22.5 %
WBC: 7.2 10*3/uL (ref 3.8–10.8)

## 2019-10-10 LAB — COMPREHENSIVE METABOLIC PANEL
AG Ratio: 2.5 (calc) (ref 1.0–2.5)
ALT: 12 U/L (ref 9–46)
AST: 15 U/L (ref 10–35)
Albumin: 4.2 g/dL (ref 3.6–5.1)
Alkaline phosphatase (APISO): 54 U/L (ref 35–144)
BUN: 18 mg/dL (ref 7–25)
CO2: 26 mmol/L (ref 20–32)
Calcium: 9.1 mg/dL (ref 8.6–10.3)
Chloride: 105 mmol/L (ref 98–110)
Creat: 0.91 mg/dL (ref 0.70–1.11)
Globulin: 1.7 g/dL (calc) — ABNORMAL LOW (ref 1.9–3.7)
Glucose, Bld: 142 mg/dL — ABNORMAL HIGH (ref 65–99)
Potassium: 4.5 mmol/L (ref 3.5–5.3)
Sodium: 141 mmol/L (ref 135–146)
Total Bilirubin: 0.7 mg/dL (ref 0.2–1.2)
Total Protein: 5.9 g/dL — ABNORMAL LOW (ref 6.1–8.1)

## 2019-10-10 LAB — HEMOGLOBIN A1C
Hgb A1c MFr Bld: 6.5 % of total Hgb — ABNORMAL HIGH (ref <5.7)
Mean Plasma Glucose: 140 (calc)
eAG (mmol/L): 7.7 (calc)

## 2019-10-10 LAB — LIPID PANEL
Cholesterol: 112 mg/dL (ref <200)
HDL: 36 mg/dL — ABNORMAL LOW (ref >=40)
LDL Cholesterol (Calc): 58 mg/dL (calc)
Non-HDL Cholesterol (Calc): 76 mg/dL (calc) (ref <130)
Total CHOL/HDL Ratio: 3.1 (calc) (ref <5.0)
Triglycerides: 101 mg/dL (ref <150)

## 2019-10-31 ENCOUNTER — Other Ambulatory Visit: Payer: Self-pay | Admitting: Family Medicine

## 2019-10-31 ENCOUNTER — Other Ambulatory Visit: Payer: Self-pay

## 2019-10-31 MED ORDER — SIMVASTATIN 10 MG PO TABS: 10.0000 mg | ORAL_TABLET | Freq: Every day | ORAL | 0 refills | Status: DC

## 2019-12-11 ENCOUNTER — Telehealth: Payer: Self-pay | Admitting: Family Medicine

## 2019-12-11 NOTE — Telephone Encounter (Signed)
We can change him to tartrate but it is not extended release.

## 2019-12-11 NOTE — Telephone Encounter (Signed)
Patient is requesting a refill on his metoprolol he states he has been cutting his pills in half and would like to know if there is a lower dose so he doesn't have to cut them. He uses [facility] order.   He also wants to know if it is safe for him to get the Covid vaccine since he has had 2 open heart surgeries and other heart issues and if so is one better than the other.   CB# [phone]

## 2019-12-11 NOTE — Telephone Encounter (Signed)
Patient is requesting a refill on his metoprolol

## 2019-12-12 NOTE — Telephone Encounter (Signed)
Tell him they do not make a 12.5 mg dose of that medication and to please continue cutting them in half.  I would strongly recommend he get the covid vaccine especially because of his heart history.  I prefer moderna or [employer]

## 2019-12-13 NOTE — Telephone Encounter (Signed)
LMTRC

## 2019-12-14 NOTE — Telephone Encounter (Signed)
Spoke to pt and made aware of recomendations

## 2020-01-10 ENCOUNTER — Other Ambulatory Visit: Payer: Self-pay | Admitting: Family Medicine

## 2020-01-10 MED ORDER — LANSOPRAZOLE 30 MG PO CPDR: 30.0000 mg | DELAYED_RELEASE_CAPSULE | Freq: Every day | ORAL | 3 refills | Status: DC

## 2020-01-10 MED ORDER — SIMVASTATIN 10 MG PO TABS: ORAL_TABLET | ORAL | 3 refills | Status: DC

## 2020-01-10 MED ORDER — METOPROLOL SUCCINATE ER 25 MG PO TB24: 12.5000 mg | ORAL_TABLET | Freq: Every day | ORAL | 3 refills | Status: DC

## 2020-01-17 ENCOUNTER — Other Ambulatory Visit: Payer: Self-pay | Admitting: Family Medicine

## 2020-01-17 MED ORDER — SIMVASTATIN 10 MG PO TABS: ORAL_TABLET | ORAL | 3 refills | Status: DC

## 2020-01-17 MED ORDER — METOPROLOL SUCCINATE ER 25 MG PO TB24: 12.5000 mg | ORAL_TABLET | Freq: Every day | ORAL | 3 refills | Status: DC

## 2020-01-17 MED ORDER — LANSOPRAZOLE 30 MG PO CPDR: 30.0000 mg | DELAYED_RELEASE_CAPSULE | Freq: Every day | ORAL | 3 refills | Status: DC

## 2020-02-08 ENCOUNTER — Ambulatory Visit: Payer: Medicare HMO

## 2020-02-08 ENCOUNTER — Other Ambulatory Visit: Payer: Self-pay

## 2020-02-08 ENCOUNTER — Other Ambulatory Visit: Payer: Medicare HMO

## 2020-02-08 VITALS — BP 130/70

## 2020-02-08 DIAGNOSIS — E118 Type 2 diabetes mellitus with unspecified complications: Secondary | ICD-10-CM

## 2020-02-08 DIAGNOSIS — D696 Thrombocytopenia, unspecified: Secondary | ICD-10-CM

## 2020-02-08 DIAGNOSIS — E785 Hyperlipidemia, unspecified: Secondary | ICD-10-CM

## 2020-02-08 DIAGNOSIS — Z0130 Encounter for examination of blood pressure without abnormal findings: Secondary | ICD-10-CM

## 2020-02-08 DIAGNOSIS — I10 Essential (primary) hypertension: Secondary | ICD-10-CM | POA: Diagnosis not present

## 2020-02-08 NOTE — Progress Notes (Signed)
Patient came in today for a BP check. BP at rest was 130/70. Advised patient BP normal and to keep regular follow ups with his PCP.

## 2020-02-09 LAB — COMPREHENSIVE METABOLIC PANEL
AG Ratio: 2.6 (calc) — ABNORMAL HIGH (ref 1.0–2.5)
ALT: 10 U/L (ref 9–46)
AST: 13 U/L (ref 10–35)
Albumin: 4.2 g/dL (ref 3.6–5.1)
Alkaline phosphatase (APISO): 56 U/L (ref 35–144)
BUN/Creatinine Ratio: 27 (calc) — ABNORMAL HIGH (ref 6–22)
BUN: 26 mg/dL — ABNORMAL HIGH (ref 7–25)
CO2: 27 mmol/L (ref 20–32)
Calcium: 8.7 mg/dL (ref 8.6–10.3)
Chloride: 106 mmol/L (ref 98–110)
Creat: 0.96 mg/dL (ref 0.70–1.11)
Globulin: 1.6 g/dL (calc) — ABNORMAL LOW (ref 1.9–3.7)
Glucose, Bld: 162 mg/dL — ABNORMAL HIGH (ref 65–99)
Potassium: 4.1 mmol/L (ref 3.5–5.3)
Sodium: 141 mmol/L (ref 135–146)
Total Bilirubin: 0.6 mg/dL (ref 0.2–1.2)
Total Protein: 5.8 g/dL — ABNORMAL LOW (ref 6.1–8.1)

## 2020-02-09 LAB — CBC WITH DIFFERENTIAL/PLATELET
Absolute Monocytes: 632 cells/uL (ref 200–950)
Basophils Absolute: 61 cells/uL (ref 0–200)
Basophils Relative: 0.9 %
Eosinophils Absolute: 170 cells/uL (ref 15–500)
Eosinophils Relative: 2.5 %
HCT: 43.5 % (ref 38.5–50.0)
Hemoglobin: 14.3 g/dL (ref 13.2–17.1)
Lymphs Abs: 1435 cells/uL (ref 850–3900)
MCH: 29.5 pg (ref 27.0–33.0)
MCHC: 32.9 g/dL (ref 32.0–36.0)
MCV: 89.9 fL (ref 80.0–100.0)
MPV: 11 fL (ref 7.5–12.5)
Monocytes Relative: 9.3 %
Neutro Abs: 4502 cells/uL (ref 1500–7800)
Neutrophils Relative %: 66.2 %
Platelets: 72 10*3/uL — ABNORMAL LOW (ref 140–400)
RBC: 4.84 10*6/uL (ref 4.20–5.80)
RDW: 12.4 % (ref 11.0–15.0)
Total Lymphocyte: 21.1 %
WBC: 6.8 10*3/uL (ref 3.8–10.8)

## 2020-02-09 LAB — HEMOGLOBIN A1C
Hgb A1c MFr Bld: 6.6 % of total Hgb — ABNORMAL HIGH (ref <5.7)
Mean Plasma Glucose: 143 (calc)
eAG (mmol/L): 7.9 (calc)

## 2020-02-09 LAB — LIPID PANEL
Cholesterol: 105 mg/dL (ref <200)
HDL: 33 mg/dL — ABNORMAL LOW (ref >=40)
LDL Cholesterol (Calc): 55 mg/dL (calc)
Non-HDL Cholesterol (Calc): 72 mg/dL (calc) (ref <130)
Total CHOL/HDL Ratio: 3.2 (calc) (ref <5.0)
Triglycerides: 87 mg/dL (ref <150)

## 2020-02-15 ENCOUNTER — Other Ambulatory Visit: Payer: Self-pay

## 2020-02-15 ENCOUNTER — Ambulatory Visit (INDEPENDENT_AMBULATORY_CARE_PROVIDER_SITE_OTHER): Payer: Medicare HMO | Admitting: Family Medicine

## 2020-02-15 VITALS — BP 120/60 | HR 66 | Temp 97.5°F | Resp 14 | Ht 69.0 in | Wt 160.0 lb

## 2020-02-15 DIAGNOSIS — E7800 Pure hypercholesterolemia, unspecified: Secondary | ICD-10-CM

## 2020-02-15 DIAGNOSIS — I2510 Atherosclerotic heart disease of native coronary artery without angina pectoris: Secondary | ICD-10-CM

## 2020-02-15 DIAGNOSIS — I10 Essential (primary) hypertension: Secondary | ICD-10-CM

## 2020-02-15 DIAGNOSIS — E118 Type 2 diabetes mellitus with unspecified complications: Secondary | ICD-10-CM

## 2020-02-15 DIAGNOSIS — D696 Thrombocytopenia, unspecified: Secondary | ICD-10-CM | POA: Diagnosis not present

## 2020-02-15 NOTE — Progress Notes (Signed)
Subjective:    Patient ID: Andrew [name], male    DOB: [birth year], 79 y.o.   MRN: [id number]  HPI Platelet count has dropped again.  Has been dealing with thrombocytopenia for 3 years (100-120).  On his most recent lab work, his platelet count has dropped to 72.  He denies any bleeding or bruising.  He is on aspirin due to his history of coronary artery disease.  He denies any chest pain shortness of breath dyspnea on exertion.  He denies any petechia or purpura or fever.  He denies any myalgias or arthralgias.  I suspect ITP.  However given the second time that his platelet count has dropped into the 70s in the last 12 months I have recommended a hematology consultation.  Patient is willing to do this.  His most recent lab work is listed below.  His hemoglobin A1c is excellent him below 7 which is his goal for his age.  His LDL cholesterol is 55 and well below 70 which is his goal.  His blood pressure today is well controlled at 120/60.  Therefore his risk factors are well managed.  His COVID-19 vaccination is up-to-date. Past Medical History:  Diagnosis Date  . Allergy    grass etc.  . Barrett's esophagus   . BPH (benign prostatic hyperplasia)   . Chest pain, atypical   . Chronic kidney disease    kidney stones  . Coronary artery disease   . GERD (gastroesophageal reflux disease)    Barrett's  . Hernia   . Hyperlipidemia   . Hypertension   . Myocardial infarction ([place])   . Thrombocytopenia ([place])   . Ulcer 1960   Past Surgical History:  Procedure Laterality Date  . CARDIAC CATHETERIZATION  02/14/2007   MITRAL REGURGITATION. LV NORMAL  . CHOLECYSTECTOMY    . COLONOSCOPY    . CORONARY ARTERY BYPASS GRAFT     twice  2 by passes each time  . INGUINAL HERNIA REPAIR     bilateral done twice  . LITHOTRIPSY    . UPPER GASTROINTESTINAL ENDOSCOPY     Current Outpatient Medications on File Prior to Visit  Medication Sig Dispense Refill  . aspirin 81 MG tablet Take 81 mg by mouth daily.       . Cholecalciferol (VITAMIN D) 2000 units CAPS Take 1 capsule by mouth daily.    . finasteride (PROSCAR) 5 MG tablet TAKE 1 TABLET(5 MG) BY MOUTH DAILY. 90 tablet 3  . lansoprazole (PREVACID) 30 MG capsule Take 1 capsule (30 mg total) by mouth daily. 90 capsule 3  . metoprolol succinate (TOPROL-XL) 25 MG 24 hr tablet Take 0.5 tablets (12.5 mg total) by mouth daily. 45 tablet 3  . Multiple Vitamin (MULTIVITAMIN) tablet Take 1 tablet by mouth daily.      . nitroGLYCERIN (NITROSTAT) 0.4 MG SL tablet Place 1 tablet (0.4 mg total) under the tongue every 5 (five) minutes as needed. 75 tablet 3  . Omega-3 Fatty Acids (FISH OIL) 1000 MG CPDR Take 1,000 mg by mouth every morning.     . simvastatin (ZOCOR) 10 MG tablet TAKE 1 TABLET(10 MG) BY MOUTH AT BEDTIME 90 tablet 3  . tamsulosin (FLOMAX) 0.4 MG CAPS capsule Take 0.4 mg by mouth at bedtime.     Current Facility-Administered Medications on File Prior to Visit  Medication Dose Route Frequency Provider Last Rate Last Admin  . 0.9 %  sodium chloride infusion  500 mL Intravenous Once Armbruster, Carlota Raspberry, MD  Allergies  Allergen Reactions  . Imdur [Isosorbide Mononitrate]     Unknown, per pt   . Meloxicam Swelling    Facial swelling    Social History   Socioeconomic History  . Marital status: Married    Spouse name: Not on file  . Number of children: 2  . Years of education: Not on file  . Highest education level: Not on file  Occupational History  . Occupation: Office manager  Tobacco Use  . Smoking status: Never Smoker  . Smokeless tobacco: Never Used  Substance and Sexual Activity  . Alcohol use: No  . Drug use: No  . Sexual activity: Not on file  Other Topics Concern  . Not on file  Social History Narrative  . Not on file   Social Determinants of Health   Financial Resource Strain:   . Difficulty of Paying Living Expenses:   Food Insecurity:   . Worried About Charity fundraiser in the Last Year:   . Arts development officer in the Last Year:   Transportation Needs:   . Film/video editor (Medical):   [name] Lack of Transportation (Non-Medical):   Physical Activity:   . Days of Exercise per Week:   . Minutes of Exercise per Session:   Stress:   . Feeling of Stress :   Social Connections:   . Frequency of Communication with Friends and Family:   . Frequency of Social Gatherings with Friends and Family:   . Attends Religious Services:   . Active Member of Clubs or Organizations:   . Attends Archivist Meetings:   [name] Marital Status:   Intimate Partner Violence:   . Fear of Current or Ex-Partner:   . Emotionally Abused:   [name] Physically Abused:   . Sexually Abused:       Review of Systems  All other systems reviewed and are negative.      Objective:   Physical Exam Vitals reviewed.  Constitutional:      Appearance: Normal appearance. He is normal weight.  Cardiovascular:     Rate and Rhythm: Normal rate and regular rhythm.     Heart sounds: Normal heart sounds. No murmur. No friction rub. No gallop.   Pulmonary:     Effort: Pulmonary effort is normal. No respiratory distress.     Breath sounds: Normal breath sounds. No stridor. No wheezing, rhonchi or rales.  Chest:     Chest wall: No tenderness.  Abdominal:     General: Bowel sounds are normal.     Palpations: Abdomen is soft.  Musculoskeletal:     Right lower leg: No edema.     Left lower leg: No edema.  Neurological:     Mental Status: He is alert.     Lab on 02/08/2020  Component Date Value Ref Range Status  . Glucose, Bld 02/08/2020 162* 65 - 99 mg/dL Final   Comment: .            Fasting reference interval . For someone without known diabetes, a glucose value >125 mg/dL indicates that they may have diabetes and this should be confirmed with a follow-up test. .   . BUN 02/08/2020 26* 7 - 25 mg/dL Final  . Creat 02/08/2020 0.96  0.70 - 1.11 mg/dL Final   Comment: For patients >76 years of age, the  reference limit for Creatinine is approximately 13% higher for people identified as African-American. .   Havery Moros Ratio 02/08/2020 27* 6 -  22 (calc) Final  . Sodium 02/08/2020 141  135 - 146 mmol/L Final  . Potassium 02/08/2020 4.1  3.5 - 5.3 mmol/L Final  . Chloride 02/08/2020 106  98 - 110 mmol/L Final  . CO2 02/08/2020 27  20 - 32 mmol/L Final  . Calcium 02/08/2020 8.7  8.6 - 10.3 mg/dL Final  . Total Protein 02/08/2020 5.8* 6.1 - 8.1 g/dL Final  . Albumin 02/08/2020 4.2  3.6 - 5.1 g/dL Final  . Globulin 02/08/2020 1.6* 1.9 - 3.7 g/dL (calc) Final  . AG Ratio 02/08/2020 2.6* 1.0 - 2.5 (calc) Final  . Total Bilirubin 02/08/2020 0.6  0.2 - 1.2 mg/dL Final  . Alkaline phosphatase (APISO) 02/08/2020 56  35 - 144 U/L Final  . AST 02/08/2020 13  10 - 35 U/L Final  . ALT 02/08/2020 10  9 - 46 U/L Final  . Hgb A1c MFr Bld 02/08/2020 6.6* <5.7 % of total Hgb Final   Comment: For someone without known diabetes, a hemoglobin A1c value of 6.5% or greater indicates that they may have  diabetes and this should be confirmed with a follow-up  test. . For someone with known diabetes, a value <7% indicates  that their diabetes is well controlled and a value  greater than or equal to 7% indicates suboptimal  control. A1c targets should be individualized based on  duration of diabetes, age, comorbid conditions, and  other considerations. . Currently, no consensus exists regarding use of hemoglobin A1c for diagnosis of diabetes for children. .   . Mean Plasma Glucose 02/08/2020 143  (calc) Final  . eAG (mmol/L) 02/08/2020 7.9  (calc) Final  . Cholesterol 02/08/2020 105  <200 mg/dL Final  . HDL 02/08/2020 33* > OR = 40 mg/dL Final  . Triglycerides 02/08/2020 87  <150 mg/dL Final  . LDL Cholesterol (Calc) 02/08/2020 55  mg/dL (calc) Final   Comment: Reference range: <100 . Desirable range <100 mg/dL for primary prevention;   <70 mg/dL for patients with CHD or diabetic patients    with > or = 2 CHD risk factors. [name] LDL-C is now calculated using the Martin-Hopkins  calculation, which is a validated novel method providing  better accuracy than the Friedewald equation in the  estimation of LDL-C.  Cresenciano Genre et al. Annamaria Helling. [id number]): 2061-2068  (http://education.[facility].com/faq/FAQ164)   . Total CHOL/HDL Ratio 02/08/2020 3.2  <5.0 (calc) Final  . Non-HDL Cholesterol (Calc) 02/08/2020 72  <130 mg/dL (calc) Final   Comment: For patients with diabetes plus 1 major ASCVD risk  factor, treating to a non-HDL-C goal of <100 mg/dL  (LDL-C of <70 mg/dL) is considered a therapeutic  option.   . WBC 02/08/2020 6.8  3.8 - 10.8 Thousand/uL Final  . RBC 02/08/2020 4.84  4.20 - 5.80 Million/uL Final  . Hemoglobin 02/08/2020 14.3  13.2 - 17.1 g/dL Final  . HCT 02/08/2020 43.5  38.5 - 50.0 % Final  . MCV 02/08/2020 89.9  80.0 - 100.0 fL Final  . MCH 02/08/2020 29.5  27.0 - 33.0 pg Final  . MCHC 02/08/2020 32.9  32.0 - 36.0 g/dL Final  . RDW 02/08/2020 12.4  11.0 - 15.0 % Final  . Platelets 02/08/2020 72* 140 - 400 Thousand/uL Final   Platelet clumps noted on smear-count appears adequate.  [name] MPV 02/08/2020 11.0  7.5 - 12.5 fL Final  . Neutro Abs 02/08/2020 4,502  1,500 - 7,800 cells/uL Final  . Lymphs Abs 02/08/2020 1,435  850 - 3,900 cells/uL Final  .  Absolute Monocytes 02/08/2020 632  200 - 950 cells/uL Final  . Eosinophils Absolute 02/08/2020 170  15 - 500 cells/uL Final  . Basophils Absolute 02/08/2020 61  0 - 200 cells/uL Final  . Neutrophils Relative % 02/08/2020 66.2  % Final  . Total Lymphocyte 02/08/2020 21.1  % Final  . Monocytes Relative 02/08/2020 9.3  % Final  . Eosinophils Relative 02/08/2020 2.5  % Final  . Basophils Relative 02/08/2020 0.9  % Final         Assessment & Plan:  Controlled type 2 diabetes mellitus with complication, without long-term current use of insulin (HCC) - Plan: Microalbumin, urine  ASCVD (arteriosclerotic cardiovascular  disease)  Benign essential HTN  Coronary artery disease involving native coronary artery of native heart without angina pectoris  Pure hypercholesterolemia Diabetes is well controlled.  We will make no changes in his management at this time.  I will check a urine microalbumin to evaluate for microalbuminuria.  Patient has no symptoms of angina or congestive heart failure.  His blood pressure today is well controlled at 120/60.  His LDL cholesterol is well below 70.  My only concern is his thrombocytopenia.  I suspect this is something we simply need to monitor more closely.  Given his history of coronary artery disease at the present time I will maintain the patient on aspirin however I would appreciate a second opinion from an oncologist.  Patient is willing to see an oncologist for second opinion and I will schedule this for him.

## 2020-02-16 LAB — MICROALBUMIN, URINE: Microalb, Ur: 0.6 mg/dL

## 2020-02-22 ENCOUNTER — Telehealth: Payer: Self-pay | Admitting: Family Medicine

## 2020-02-22 NOTE — Chronic Care Management (AMB) (Signed)
  Chronic Care Management   Outreach Note  02/22/2020 Name: Andrew [name] MRN: [id number] DOB: 02-26-1941  Referred by: Susy Frizzle, MD Reason for referral : Chronic Care Management   An unsuccessful telephone outreach was attempted today. The patient was referred to the pharmacist for assistance with care management and care coordination.   Follow Up Plan:    Chronic Care Management   Outreach Note  02/22/2020 Name: Andrew [name] MRN: [id number] DOB: 12-18-1940  Referred by: Susy Frizzle, MD Reason for referral : Chronic Care Management   An unsuccessful telephone outreach was attempted today. The patient was referred to the pharmacist for assistance with care management and care coordination.   Follow Up Plan:   [name]

## 2020-02-29 ENCOUNTER — Other Ambulatory Visit: Payer: Self-pay

## 2020-02-29 ENCOUNTER — Inpatient Hospital Stay (HOSPITAL_COMMUNITY): Payer: Medicare HMO | Attending: Hematology | Admitting: Hematology

## 2020-02-29 ENCOUNTER — Encounter (HOSPITAL_COMMUNITY): Payer: Self-pay | Admitting: Hematology

## 2020-02-29 ENCOUNTER — Inpatient Hospital Stay (HOSPITAL_COMMUNITY): Payer: Medicare HMO

## 2020-02-29 VITALS — BP 120/68 | HR 71 | Temp 97.5°F | Resp 16 | Ht 69.0 in | Wt 162.4 lb

## 2020-02-29 DIAGNOSIS — I129 Hypertensive chronic kidney disease with stage 1 through stage 4 chronic kidney disease, or unspecified chronic kidney disease: Secondary | ICD-10-CM | POA: Diagnosis not present

## 2020-02-29 DIAGNOSIS — I252 Old myocardial infarction: Secondary | ICD-10-CM | POA: Diagnosis not present

## 2020-02-29 DIAGNOSIS — D696 Thrombocytopenia, unspecified: Secondary | ICD-10-CM | POA: Insufficient documentation

## 2020-02-29 DIAGNOSIS — I2510 Atherosclerotic heart disease of native coronary artery without angina pectoris: Secondary | ICD-10-CM | POA: Diagnosis not present

## 2020-02-29 DIAGNOSIS — N189 Chronic kidney disease, unspecified: Secondary | ICD-10-CM | POA: Insufficient documentation

## 2020-02-29 DIAGNOSIS — Z79899 Other long term (current) drug therapy: Secondary | ICD-10-CM | POA: Diagnosis not present

## 2020-02-29 LAB — VITAMIN B12: Vitamin B-12: 1083 pg/mL — ABNORMAL HIGH (ref 180–914)

## 2020-02-29 LAB — COMPREHENSIVE METABOLIC PANEL
ALT: 17 U/L (ref 0–44)
AST: 18 U/L (ref 15–41)
Albumin: 4 g/dL (ref 3.5–5.0)
Alkaline Phosphatase: 58 U/L (ref 38–126)
Anion gap: 7 (ref 5–15)
BUN: 26 mg/dL — ABNORMAL HIGH (ref 8–23)
CO2: 26 mmol/L (ref 22–32)
Calcium: 8.6 mg/dL — ABNORMAL LOW (ref 8.9–10.3)
Chloride: 106 mmol/L (ref 98–111)
Creatinine, Ser: 0.89 mg/dL (ref 0.61–1.24)
GFR calc Af Amer: >60 mL/min (ref >60)
GFR calc non Af Amer: >60 mL/min (ref >60)
Glucose, Bld: 158 mg/dL — ABNORMAL HIGH (ref 70–99)
Potassium: 4.2 mmol/L (ref 3.5–5.1)
Sodium: 139 mmol/L (ref 135–145)
Total Bilirubin: 0.7 mg/dL (ref 0.3–1.2)
Total Protein: 6.4 g/dL — ABNORMAL LOW (ref 6.5–8.1)

## 2020-02-29 LAB — CBC WITH DIFFERENTIAL/PLATELET
Abs Immature Granulocytes: 0.01 10*3/uL (ref 0.00–0.07)
Basophils Absolute: 0 10*3/uL (ref 0.0–0.1)
Basophils Relative: 1 %
Eosinophils Absolute: 0.1 10*3/uL (ref 0.0–0.5)
Eosinophils Relative: 1 %
HCT: 42.4 % (ref 39.0–52.0)
Hemoglobin: 14 g/dL (ref 13.0–17.0)
Immature Granulocytes: 0 %
Lymphocytes Relative: 20 %
Lymphs Abs: 1.4 10*3/uL (ref 0.7–4.0)
MCH: 30.1 pg (ref 26.0–34.0)
MCHC: 33 g/dL (ref 30.0–36.0)
MCV: 91.2 fL (ref 80.0–100.0)
Monocytes Absolute: 0.5 10*3/uL (ref 0.1–1.0)
Monocytes Relative: 7 %
Neutro Abs: 4.9 10*3/uL (ref 1.7–7.7)
Neutrophils Relative %: 71 %
Platelets: 158 10*3/uL (ref 150–400)
RBC: 4.65 MIL/uL (ref 4.22–5.81)
RDW: 12.8 % (ref 11.5–15.5)
WBC: 6.9 10*3/uL (ref 4.0–10.5)
nRBC: 0 % (ref 0.0–0.2)

## 2020-02-29 LAB — VITAMIN D 25 HYDROXY (VIT D DEFICIENCY, FRACTURES): Vit D, 25-Hydroxy: 52.05 ng/mL (ref 30–100)

## 2020-02-29 LAB — FOLATE: Folate: 20.4 ng/mL (ref >5.9)

## 2020-02-29 LAB — HEPATITIS B CORE ANTIBODY, IGM: Hep B C IgM: NONREACTIVE

## 2020-02-29 LAB — HEPATITIS B SURFACE ANTIBODY,QUALITATIVE: Hep B S Ab: NONREACTIVE

## 2020-02-29 LAB — IRON AND TIBC
Iron: 52 ug/dL (ref 45–182)
Saturation Ratios: 19 % (ref 17.9–39.5)
TIBC: 270 ug/dL (ref 250–450)
UIBC: 218 ug/dL

## 2020-02-29 LAB — LACTATE DEHYDROGENASE: LDH: 135 U/L (ref 98–192)

## 2020-02-29 LAB — HEPATITIS B SURFACE ANTIGEN: Hepatitis B Surface Ag: NONREACTIVE

## 2020-02-29 LAB — FERRITIN: Ferritin: 84 ng/mL (ref 24–336)

## 2020-02-29 NOTE — Assessment & Plan Note (Addendum)
1.  Mild to moderate thrombocytopenia: -CBC on 02/08/2020 shows platelet count 72 with normal white count and hemoglobin.  His platelet count has been low since 2011, mostly in the 120 range. -He denies any new medications.  No fevers, night sweats or weight loss. -No easy bruising or bleeding. -CTAP on 07/12/2019 shows normal spleen.  No hepatic masses.  No enlarged lymph nodes. 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  He reportedly gets a rash in the right groin region about 2 in in diameter every few months which goes away within a week spontaneously. -We discussed the differential diagnosis including immune thrombocytopenia versus myelodysplastic syndrome. -We will check his CBC, repeat platelet count in blue top tube.  We will check LDH and reticulocyte count.  We will check for nutritional deficiencies and infectious causes and connective tissue disorders. -We will see him back in 1 to 2 weeks for follow-up. -If the platelet count drops below 50 K, I have recommended bone marrow biopsy.

## 2020-02-29 NOTE — Progress Notes (Signed)
CONSULT NOTE  Patient Care Team: Susy Frizzle, MD as PCP - General (Family Medicine) Nahser, Wonda Cheng, MD as PCP - Cardiology (Cardiology)  CHIEF COMPLAINTS/PURPOSE OF CONSULTATION: Thrombocytopenia  HISTORY OF PRESENTING ILLNESS:  Andrew [name] 79 y.o. male was sent here by his PCP for thrombocytopenia.  Patient has had documented mild thrombocytopenia since 2011.  He recently dropped his platelets below 100 so his PCP referred him to [place] for work-up.  Patient reports he has never had any issues with bleeding.  He has never needed a blood transfusion.  He denies any bleeding such as epistasis, hematuria or hematochezia.  He has had a colonoscopy approximately 3 years ago that was normal per patient.  He denies any alcohol, smoking or illicit drug use.  Patient does report he was bitten by a tick 2 years ago and was treated for [facility] spotted fever.  He reports the spot where he was bitten flares up with a bull's-eye every so often.  He denies any pica eats a variety of diet.  He has not started any new medications.  And he is not on any steroids.  He denies any B symptoms including fevers, chills, night sweats or unexplained weight loss.  CT AP in 2017 revealed normal size spleen.  Patient denies any over-the-counter NSAID ingestion.  He he does take aspirin daily.  He has no prior history or diagnosis of cancer.  He denies any recent chest pain on exertion, shortness of breath on minimal exertion, presyncopal episodes or palpitations.  Patient reports a positive family history for a brother with prostate cancer and a sister with breast cancer.  He lives alone and performs all his own ADLs   MEDICAL HISTORY:  Past Medical History:  Diagnosis Date  . Allergy    grass etc.  . Barrett's esophagus   . BPH (benign prostatic hyperplasia)   . Chest pain, atypical   . Chronic kidney disease    kidney stones  . Coronary artery disease   . GERD (gastroesophageal reflux disease)     Barrett's  . Hernia   . Hyperlipidemia   . Hypertension   . Myocardial infarction ([place])   . Thrombocytopenia ([place])   . Ulcer 1960    SURGICAL HISTORY: Past Surgical History:  Procedure Laterality Date  . CARDIAC CATHETERIZATION  02/14/2007   MITRAL REGURGITATION. LV NORMAL  . CHOLECYSTECTOMY    . COLONOSCOPY    . CORONARY ARTERY BYPASS GRAFT     twice  2 by passes each time  . INGUINAL HERNIA REPAIR     bilateral done twice  . LITHOTRIPSY    . UPPER GASTROINTESTINAL ENDOSCOPY      SOCIAL HISTORY: Social History   Socioeconomic History  . Marital status: Married    Spouse name: Not on file  . Number of children: 2  . Years of education: Not on file  . Highest education level: Not on file  Occupational History  . Occupation: Retired  Tobacco Use  . Smoking status: Never Smoker  . Smokeless tobacco: Never Used  Substance and Sexual Activity  . Alcohol use: No  . Drug use: No  . Sexual activity: Not Currently  Other Topics Concern  . Not on file  Social History Narrative  . Not on file   Social Determinants of Health   Financial Resource Strain:   . Difficulty of Paying Living Expenses:   Food Insecurity:   . Worried About Charity fundraiser in the  Last Year:   . [place] in the Last Year:   Transportation Needs:   . Film/video editor (Medical):   [name] Lack of Transportation (Non-Medical):   Physical Activity:   . Days of Exercise per Week:   . Minutes of Exercise per Session:   Stress:   . Feeling of Stress :   Social Connections:   . Frequency of Communication with Friends and Family:   . Frequency of Social Gatherings with Friends and Family:   . Attends Religious Services:   . Active Member of Clubs or Organizations:   . Attends Archivist Meetings:   [name] Marital Status:   Intimate Partner Violence:   . Fear of Current or Ex-Partner:   . Emotionally Abused:   [name] Physically Abused:   . Sexually Abused:     FAMILY  HISTORY: Family History  Problem Relation Age of Onset  . Stroke Father   . Heart attack Mother   . Heart attack Maternal Grandmother   . Heart attack Maternal Grandfather   . Heart disease Sister   . Heart disease Brother   . Heart disease Sister   . Heart disease Brother   . Prostate cancer Brother   . Colon cancer Neg Hx   . Esophageal cancer Neg Hx   . Rectal cancer Neg Hx   . Stomach cancer Neg Hx     ALLERGIES:  is allergic to imdur [isosorbide mononitrate] and meloxicam.  MEDICATIONS:  Current Outpatient Medications  Medication Sig Dispense Refill  . aspirin 81 MG tablet Take 81 mg by mouth daily.      . Cholecalciferol (VITAMIN D) 2000 units CAPS Take 1 capsule by mouth daily.    . finasteride (PROSCAR) 5 MG tablet TAKE 1 TABLET(5 MG) BY MOUTH DAILY. 90 tablet 3  . lansoprazole (PREVACID) 30 MG capsule Take 1 capsule (30 mg total) by mouth daily. 90 capsule 3  . metoprolol succinate (TOPROL-XL) 25 MG 24 hr tablet Take 0.5 tablets (12.5 mg total) by mouth daily. 45 tablet 3  . Multiple Vitamin (MULTIVITAMIN) tablet Take 1 tablet by mouth daily.      . nitroGLYCERIN (NITROSTAT) 0.4 MG SL tablet Place 1 tablet (0.4 mg total) under the tongue every 5 (five) minutes as needed. 75 tablet 3  . Omega-3 Fatty Acids (FISH OIL) 1000 MG CPDR Take 1,000 mg by mouth every morning.     . simvastatin (ZOCOR) 10 MG tablet TAKE 1 TABLET(10 MG) BY MOUTH AT BEDTIME 90 tablet 3  . tamsulosin (FLOMAX) 0.4 MG CAPS capsule Take 0.4 mg by mouth at bedtime.     Current Facility-Administered Medications  Medication Dose Route Frequency Provider Last Rate Last Admin  . 0.9 %  sodium chloride infusion  500 mL Intravenous Once Armbruster, Carlota Raspberry, MD        REVIEW OF SYSTEMS:   Constitutional: Denies fevers, chills or abnormal night sweats, + headaches Respiratory: Denies cough, dyspnea or wheezes Cardiovascular: Denies palpitation, chest discomfort or lower extremity swelling Gastrointestinal:   Denies nausea, heartburn or change in bowel habits, + diarrhea Skin: Denies abnormal skin rashes Lymphatics: Denies new lymphadenopathy or easy bruising Neurological:Denies numbness, tingling or new weaknesses Behavioral/Psych: Mood is stable, no new changes  All other systems were reviewed with the patient and are negative.  PHYSICAL EXAMINATION: ECOG PERFORMANCE STATUS: 0 - Asymptomatic  Vitals:   02/29/20 1412  BP: 120/68  Pulse: 71  Resp: 16  Temp: (!) 97.5 F (36.4  C)  SpO2: 97%   Filed Weights   02/29/20 1412  Weight: 162 lb 6.4 oz (73.7 kg)    GENERAL:alert, no distress and comfortable SKIN: skin color, texture, turgor are normal, no rashes or significant lesions NECK: supple, thyroid normal size, non-tender, without nodularity LYMPH:  no palpable lymphadenopathy in the cervical, axillary or inguinal LUNGS: clear to auscultation and percussion with normal breathing effort HEART: regular rate & rhythm and no murmurs and no lower extremity edema ABDOMEN:abdomen soft, non-tender and normal bowel sounds Musculoskeletal:no cyanosis of digits and no clubbing  PSYCH: alert & oriented x 3 with fluent speech NEURO: no focal motor/sensory deficits  LABORATORY DATA:  I have reviewed the data as listed Recent Results (from the past 2160 hour(s))  Comprehensive metabolic panel     Status: Abnormal   Collection Time: 02/08/20  8:02 AM  Result Value Ref Range   Glucose, Bld 162 (H) 65 - 99 mg/dL    Comment: .            Fasting reference interval . For someone without known diabetes, a glucose value >125 mg/dL indicates that they may have diabetes and this should be confirmed with a follow-up test. .    BUN 26 (H) 7 - 25 mg/dL   Creat 0.96 0.70 - 1.11 mg/dL    Comment: For patients >1 years of age, the reference limit for Creatinine is approximately 13% higher for people identified as African-American. .    BUN/Creatinine Ratio 27 (H) 6 - 22 (calc)   Sodium 141  135 - 146 mmol/L   Potassium 4.1 3.5 - 5.3 mmol/L   Chloride 106 98 - 110 mmol/L   CO2 27 20 - 32 mmol/L   Calcium 8.7 8.6 - 10.3 mg/dL   Total Protein 5.8 (L) 6.1 - 8.1 g/dL   Albumin 4.2 3.6 - 5.1 g/dL   Globulin 1.6 (L) 1.9 - 3.7 g/dL (calc)   AG Ratio 2.6 (H) 1.0 - 2.5 (calc)   Total Bilirubin 0.6 0.2 - 1.2 mg/dL   Alkaline phosphatase (APISO) 56 35 - 144 U/L   AST 13 10 - 35 U/L   ALT 10 9 - 46 U/L  Hemoglobin A1c     Status: Abnormal   Collection Time: 02/08/20  8:02 AM  Result Value Ref Range   Hgb A1c MFr Bld 6.6 (H) <5.7 % of total Hgb    Comment: For someone without known diabetes, a hemoglobin A1c value of 6.5% or greater indicates that they may have  diabetes and this should be confirmed with a follow-up  test. . For someone with known diabetes, a value <7% indicates  that their diabetes is well controlled and a value  greater than or equal to 7% indicates suboptimal  control. A1c targets should be individualized based on  duration of diabetes, age, comorbid conditions, and  other considerations. . Currently, no consensus exists regarding use of hemoglobin A1c for diagnosis of diabetes for children. .    Mean Plasma Glucose 143 (calc)   eAG (mmol/L) 7.9 (calc)  Lipid Panel     Status: Abnormal   Collection Time: 02/08/20  8:02 AM  Result Value Ref Range   Cholesterol 105 <200 mg/dL   HDL 33 (L) > OR = 40 mg/dL   Triglycerides 87 <150 mg/dL   LDL Cholesterol (Calc) 55 mg/dL (calc)    Comment: Reference range: <100 . Desirable range <100 mg/dL for primary prevention;   <70 mg/dL for patients with CHD  or diabetic patients  with > or = 2 CHD risk factors. [name] LDL-C is now calculated using the Martin-Hopkins  calculation, which is a validated novel method providing  better accuracy than the Friedewald equation in the  estimation of LDL-C.  Cresenciano Genre et al. Annamaria Helling. 6440;347(42): 2061-2068  (http://education.[employer].com/faq/FAQ164)    Total CHOL/HDL  Ratio 3.2 <5.0 (calc)   Non-HDL Cholesterol (Calc) 72 <130 mg/dL (calc)    Comment: For patients with diabetes plus 1 major ASCVD risk  factor, treating to a non-HDL-C goal of <100 mg/dL  (LDL-C of <70 mg/dL) is considered a therapeutic  option.   CBC with Differential/Platelet     Status: Abnormal   Collection Time: 02/08/20  8:02 AM  Result Value Ref Range   WBC 6.8 3.8 - 10.8 Thousand/uL   RBC 4.84 4.20 - 5.80 Million/uL   Hemoglobin 14.3 13.2 - 17.1 g/dL   HCT 43.5 38.5 - 50.0 %   MCV 89.9 80.0 - 100.0 fL   MCH 29.5 27.0 - 33.0 pg   MCHC 32.9 32.0 - 36.0 g/dL   RDW 12.4 11.0 - 15.0 %   Platelets 72 (L) 140 - 400 Thousand/uL    Comment: Platelet clumps noted on smear-count appears adequate.   MPV 11.0 7.5 - 12.5 fL   Neutro Abs 4,502 1,500 - 7,800 cells/uL   Lymphs Abs 1,435 850 - 3,900 cells/uL   Absolute Monocytes 632 200 - 950 cells/uL   Eosinophils Absolute 170 15 - 500 cells/uL   Basophils Absolute 61 0 - 200 cells/uL   Neutrophils Relative % 66.2 %   Total Lymphocyte 21.1 %   Monocytes Relative 9.3 %   Eosinophils Relative 2.5 %   Basophils Relative 0.9 %  Microalbumin, urine     Status: None   Collection Time: 02/15/20  8:45 AM  Result Value Ref Range   Microalb, Ur 0.6 mg/dL    Comment: Reference Range Not established    RAM      Comment: . The ADA defines abnormalities in albumin excretion as follows: [name] Category         Result (mcg/mg creatinine) . Normal                    <30 Microalbuminuria         30-299  Clinical albuminuria   > OR = 300 . The ADA recommends that at least two of three specimens collected within a 3-6 month period be abnormal before considering a patient to be within a diagnostic category.   Folate     Status: None   Collection Time: 02/29/20  3:54 PM  Result Value Ref Range   Folate 20.4 >5.9 ng/mL    Comment: Performed at [facility], [address], [place], [place] [address]  Vitamin B12     Status: Abnormal    Collection Time: 02/29/20  3:54 PM  Result Value Ref Range   Vitamin B-12 1,083 (H) 180 - 914 pg/mL    Comment: (NOTE) This assay is not validated for testing neonatal or myeloproliferative syndrome specimens for Vitamin B12 levels. Performed at [facility], [address], [place], [place] [address]   Iron and TIBC     Status: None   Collection Time: 02/29/20  3:54 PM  Result Value Ref Range   Iron 52 45 - 182 ug/dL   TIBC 270 250 - 450 ug/dL   Saturation Ratios 19 17.9 - 39.5 %   UIBC 218 ug/dL  Comment: Performed at [facility], [address] [place], [place] [address]  Ferritin     Status: None   Collection Time: 02/29/20  3:54 PM  Result Value Ref Range   Ferritin 84 24 - 336 ng/mL    Comment: Performed at [facility], [address], [place], [place] [address]  Comprehensive metabolic panel     Status: Abnormal   Collection Time: 02/29/20  3:54 PM  Result Value Ref Range   Sodium 139 135 - 145 mmol/L   Potassium 4.2 3.5 - 5.1 mmol/L   Chloride 106 98 - 111 mmol/L   CO2 26 22 - 32 mmol/L   Glucose, Bld 158 (H) 70 - 99 mg/dL    Comment: Glucose reference range applies only to samples taken after fasting for at least 8 hours.   BUN 26 (H) 8 - 23 mg/dL   Creatinine, Ser 0.89 0.61 - 1.24 mg/dL   Calcium 8.6 (L) 8.9 - 10.3 mg/dL   Total Protein 6.4 (L) 6.5 - 8.1 g/dL   Albumin 4.0 3.5 - 5.0 g/dL   AST 18 15 - 41 U/L   ALT 17 0 - 44 U/L   Alkaline Phosphatase 58 38 - 126 U/L   Total Bilirubin 0.7 0.3 - 1.2 mg/dL   GFR calc non Af Amer >60 >60 mL/min   GFR calc Af Amer >60 >60 mL/min   Anion gap 7 5 - 15    Comment: Performed at [facility], [address], [place], [place] [address]  CBC with Differential/Platelet     Status: None   Collection Time: 02/29/20  3:54 PM  Result Value Ref Range   WBC 6.9 4.0 - 10.5 K/uL   RBC 4.65 4.22 - 5.81 MIL/uL   Hemoglobin 14.0 13.0 - 17.0 g/dL   HCT 42.4 39.0 - 52.0 %   MCV 91.2 80.0 - 100.0 fL   MCH 30.1 26.0 - 34.0 pg    MCHC 33.0 30.0 - 36.0 g/dL   RDW 12.8 11.5 - 15.5 %   Platelets 158 150 - 400 K/uL   nRBC 0.0 0.0 - 0.2 %   Neutrophils Relative % 71 %   Neutro Abs 4.9 1.7 - 7.7 K/uL   Lymphocytes Relative 20 %   Lymphs Abs 1.4 0.7 - 4.0 K/uL   Monocytes Relative 7 %   Monocytes Absolute 0.5 0.1 - 1.0 K/uL   Eosinophils Relative 1 %   Eosinophils Absolute 0.1 0.0 - 0.5 K/uL   Basophils Relative 1 %   Basophils Absolute 0.0 0.0 - 0.1 K/uL   Immature Granulocytes 0 %   Abs Immature Granulocytes 0.01 0.00 - 0.07 K/uL    Comment: Performed at [facility], [address], [place], [place] [address]  Lactate dehydrogenase     Status: None   Collection Time: 02/29/20  3:54 PM  Result Value Ref Range   LDH 135 98 - 192 U/L    Comment: Performed at [facility], [address], [place], [place] [address]    RADIOGRAPHIC STUDIES: I have personally reviewed the radiological images as listed and agreed with the findings in the report.  ASSESSMENT & PLAN:  Thrombocytopenia ([place]) 1.  Mild to moderate thrombocytopenia: -CBC on 02/08/2020 shows platelet count 72 with normal white count and hemoglobin.  His platelet count has been low since 2011, mostly in the 120 range. -He denies any new medications.  No fevers, night sweats or weight loss. -No easy bruising or bleeding. -CTAP on 07/12/2019  shows normal spleen.  No hepatic masses.  No enlarged lymph nodes. 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  He reportedly gets a rash in the right groin region about 2 in in diameter every few months which goes away within a week spontaneously. -We discussed the differential diagnosis including immune thrombocytopenia versus myelodysplastic syndrome. -We will check his CBC, repeat platelet count in blue top tube.  We will check LDH and reticulocyte count.  We will check for nutritional deficiencies and infectious causes and connective tissue disorders. -We will see him back in 1 to 2 weeks for follow-up. -If the  platelet count drops below 50 K, I have recommended bone marrow biopsy.    All questions were answered. The patient knows to call the clinic with any problems, questions or concerns.      Derek Jack, MD 02/29/20 6:13 PM

## 2020-02-29 NOTE — Patient Instructions (Signed)
[facility] Discharge Instructions  Follow up in 2-3 weeks   Thank you for choosing [facility] at [facility] to provide your oncology and hematology care.  To afford each patient quality time with our provider, please arrive at least 15 minutes before your scheduled appointment time.   If you have a lab appointment with the Cancer Center please come in thru the Main Entrance and check in at the main information desk.  You need to re-schedule your appointment should you arrive 10 or more minutes late.  We strive to give you quality time with our providers, and arriving late affects you and other patients whose appointments are after yours.  Also, if you no show three or more times for appointments you may be dismissed from the clinic at the providers discretion.     Again, thank you for choosing [facility].  Our hope is that these requests will decrease the amount of time that you wait before being seen by our physicians.       _____________________________________________________________  Should you have questions after your visit to [facility], please contact our office at [phone] between the hours of 8:00 a.m. and 4:30 p.m.  Voicemails left after 4:00 p.m. will not be returned until the following business day.  For prescription refill requests, have your pharmacy contact our office and allow 72 hours.    Due to Covid, you will need to wear a mask upon entering the hospital. If you do not have a mask, a mask will be given to you at the Main Entrance upon arrival. For doctor visits, patients may have 1 support person with them. For treatment visits, patients can not have anyone with them due to social distancing guidelines and our immunocompromised population.      

## 2020-03-01 LAB — ANTINUCLEAR ANTIBODIES, IFA: ANA Ab, IFA: NEGATIVE

## 2020-03-01 LAB — RHEUMATOID FACTOR: Rheumatoid fact SerPl-aCnc: <10 IU/mL (ref 0.0–13.9)

## 2020-03-05 LAB — COPPER, SERUM: Copper: 89 ug/dL (ref 69–132)

## 2020-03-07 LAB — METHYLMALONIC ACID, SERUM: Methylmalonic Acid, Quantitative: 130 nmol/L (ref 0–378)

## 2020-03-15 ENCOUNTER — Other Ambulatory Visit: Payer: Self-pay

## 2020-03-15 ENCOUNTER — Inpatient Hospital Stay (HOSPITAL_COMMUNITY): Payer: Medicare HMO | Attending: Hematology | Admitting: Hematology

## 2020-03-15 VITALS — BP 126/69 | HR 73 | Temp 97.9°F | Resp 18 | Wt 162.0 lb

## 2020-03-15 DIAGNOSIS — Z79899 Other long term (current) drug therapy: Secondary | ICD-10-CM | POA: Insufficient documentation

## 2020-03-15 DIAGNOSIS — I129 Hypertensive chronic kidney disease with stage 1 through stage 4 chronic kidney disease, or unspecified chronic kidney disease: Secondary | ICD-10-CM | POA: Insufficient documentation

## 2020-03-15 DIAGNOSIS — D696 Thrombocytopenia, unspecified: Secondary | ICD-10-CM | POA: Insufficient documentation

## 2020-03-15 DIAGNOSIS — N189 Chronic kidney disease, unspecified: Secondary | ICD-10-CM | POA: Diagnosis not present

## 2020-03-15 DIAGNOSIS — I252 Old myocardial infarction: Secondary | ICD-10-CM | POA: Diagnosis not present

## 2020-03-15 NOTE — Progress Notes (Signed)
[place], [place] [address]   CLINIC:  Medical Oncology/Hematology  PCP:  Susy Frizzle, MD [address] [place] [address]  [phone]  REASON FOR VISIT:  Follow-up for thrombocytopenia  PRIOR THERAPY: None  CURRENT THERAPY: Under work up  INTERVAL HISTORY:  Andrew [name], a 79 y.o. male, returns for routine follow-up for his thrombocytopenia. Andrew [name] was last seen on 02/29/2020.  Today Andrew [name] reports feeling well overall.  Denies any easy bruising or bleeding.  Appetite and energy levels are 100%.    REVIEW OF SYSTEMS:  Review of Systems  Constitutional: Negative for appetite change and fatigue.  All other systems reviewed and are negative.   PAST MEDICAL/SURGICAL HISTORY:  Past Medical History:  Diagnosis Date  . Allergy    grass etc.  . Barrett's esophagus   . BPH (benign prostatic hyperplasia)   . Chest pain, atypical   . Chronic kidney disease    kidney stones  . Coronary artery disease   . GERD (gastroesophageal reflux disease)    Barrett's  . Hernia   . Hyperlipidemia   . Hypertension   . Myocardial infarction ([place])   . Thrombocytopenia ([place])   . Ulcer 1960   Past Surgical History:  Procedure Laterality Date  . CARDIAC CATHETERIZATION  02/14/2007   MITRAL REGURGITATION. LV NORMAL  . CHOLECYSTECTOMY    . COLONOSCOPY    . CORONARY ARTERY BYPASS GRAFT     twice  2 by passes each time  . INGUINAL HERNIA REPAIR     bilateral done twice  . LITHOTRIPSY    . UPPER GASTROINTESTINAL ENDOSCOPY      SOCIAL HISTORY:  Social History   Socioeconomic History  . Marital status: Married    Spouse name: Not on file  . Number of children: 2  . Years of education: Not on file  . Highest education level: Not on file  Occupational History  . Occupation: Retired  Tobacco Use  . Smoking status: Never Smoker  . Smokeless tobacco: Never Used  Substance and Sexual Activity  . Alcohol use: No  . Drug  use: No  . Sexual activity: Not Currently  Other Topics Concern  . Not on file  Social History Narrative  . Not on file   Social Determinants of Health   Financial Resource Strain:   . Difficulty of Paying Living Expenses:   Food Insecurity:   . Worried About Charity fundraiser in the Last Year:   . Arboriculturist in the Last Year:   Transportation Needs:   . Film/video editor (Medical):   [name] Lack of Transportation (Non-Medical):   Physical Activity:   . Days of Exercise per Week:   . Minutes of Exercise per Session:   Stress:   . Feeling of Stress :   Social Connections:   . Frequency of Communication with Friends and Family:   . Frequency of Social Gatherings with Friends and Family:   . Attends Religious Services:   . Active Member of Clubs or Organizations:   . Attends Archivist Meetings:   [name] Marital Status:   Intimate Partner Violence:   . Fear of Current or Ex-Partner:   . Emotionally Abused:   [name] Physically Abused:   . Sexually Abused:     FAMILY HISTORY:  Family History  Problem Relation Age of Onset  . Stroke Father   . Heart attack Mother   .  Heart attack Maternal Grandmother   . Heart attack Maternal Grandfather   . Heart disease Sister   . Heart disease Brother   . Heart disease Sister   . Heart disease Brother   . Prostate cancer Brother   . Colon cancer Neg Hx   . Esophageal cancer Neg Hx   . Rectal cancer Neg Hx   . Stomach cancer Neg Hx     CURRENT MEDICATIONS:  Current Outpatient Medications  Medication Sig Dispense Refill  . aspirin 81 MG tablet Take 81 mg by mouth daily.      . Cholecalciferol (VITAMIN D) 2000 units CAPS Take 1 capsule by mouth daily.    . finasteride (PROSCAR) 5 MG tablet TAKE 1 TABLET(5 MG) BY MOUTH DAILY. 90 tablet 3  . lansoprazole (PREVACID) 30 MG capsule Take 1 capsule (30 mg total) by mouth daily. 90 capsule 3  . metoprolol succinate (TOPROL-XL) 25 MG 24 hr tablet Take 0.5 tablets (12.5 mg total)  by mouth daily. 45 tablet 3  . Multiple Vitamin (MULTIVITAMIN) tablet Take 1 tablet by mouth daily.      . Omega-3 Fatty Acids (FISH OIL) 1000 MG CPDR Take 1,000 mg by mouth every morning.     . simvastatin (ZOCOR) 10 MG tablet TAKE 1 TABLET(10 MG) BY MOUTH AT BEDTIME 90 tablet 3  . tamsulosin (FLOMAX) 0.4 MG CAPS capsule Take 0.4 mg by mouth at bedtime.    . nitroGLYCERIN (NITROSTAT) 0.4 MG SL tablet Place 1 tablet (0.4 mg total) under the tongue every 5 (five) minutes as needed. (Patient not taking: Reported on 03/15/2020) 75 tablet 3   Current Facility-Administered Medications  Medication Dose Route Frequency Provider Last Rate Last Admin  . 0.9 %  sodium chloride infusion  500 mL Intravenous Once Armbruster, Carlota Raspberry, MD        ALLERGIES:  Allergies  Allergen Reactions  . Imdur [Isosorbide Mononitrate]     Unknown, per pt   . Meloxicam Swelling    Facial swelling     PHYSICAL EXAM:  Performance status (ECOG): 0 - Asymptomatic  Vitals:   03/15/20 1450  BP: 126/69  Pulse: 73  Resp: 18  Temp: 97.9 F (36.6 C)  SpO2: 98%   Wt Readings from Last 3 Encounters:  03/15/20 162 lb (73.5 kg)  02/29/20 162 lb 6.4 oz (73.7 kg)  02/15/20 160 lb (72.6 kg)   Physical Exam Vitals reviewed.  Constitutional:      Appearance: Normal appearance.  Neurological:     General: No focal deficit present.     Mental Status: Andrew [name] is alert and oriented to person, place, and time.  Psychiatric:        Mood and Affect: Mood normal.        Behavior: Behavior normal.     LABORATORY DATA:  I have reviewed the labs as listed.  CBC Latest Ref Rng & Units 02/29/2020 02/08/2020 10/09/2019  WBC 4.0 - 10.5 K/uL 6.9 6.8 7.2  Hemoglobin 13.0 - 17.0 g/dL 14.0 14.3 14.7  Hematocrit 39.0 - 52.0 % 42.4 43.5 43.3  Platelets 150 - 400 K/uL 158 72(L) -   CMP Latest Ref Rng & Units 02/29/2020 02/08/2020 10/09/2019  Glucose 70 - 99 mg/dL 158(H) 162(H) 142(H)  BUN 8 - 23 mg/dL 26(H) 26(H) 18  Creatinine 0.61  - 1.24 mg/dL 0.89 0.96 0.91  Sodium 135 - 145 mmol/L 139 141 141  Potassium 3.5 - 5.1 mmol/L 4.2 4.1 4.5  Chloride 98 - 111 mmol/L  106 106 105  CO2 22 - 32 mmol/L 26 27 26   Calcium 8.9 - 10.3 mg/dL 8.6(L) 8.7 9.1  Total Protein 6.5 - 8.1 g/dL 6.4(L) 5.8(L) 5.9(L)  Total Bilirubin 0.3 - 1.2 mg/dL 0.7 0.6 0.7  Alkaline Phos 38 - 126 U/L 58 - -  AST 15 - 41 U/L 18 13 15   ALT 0 - 44 U/L 17 10 12       Component Value Date/Time   RBC 4.65 02/29/2020 1554   MCV 91.2 02/29/2020 1554   MCH 30.1 02/29/2020 1554   MCHC 33.0 02/29/2020 1554   RDW 12.8 02/29/2020 1554   LYMPHSABS 1.4 02/29/2020 1554   MONOABS 0.5 02/29/2020 1554   EOSABS 0.1 02/29/2020 1554   BASOSABS 0.0 02/29/2020 1554    DIAGNOSTIC IMAGING:  I have independently reviewed the scans and discussed with the patient.   ASSESSMENT:  1.  Mild to moderate thrombocytopenia: -Patient evaluated for moderate thrombocytopenia.  CBC on 02/08/2020 showed platelet count 72 with normal white count and hemoglobin. -Andrew [name] had platelet count low since 2011, mostly in the 120 range. -CTAP on 07/12/2019 shows normal spleen with no hepatic masses.  Normal enlarged lymph nodes. -Reviewed labs from 02/29/2020 which showed normal platelet count of 158.  Nutritional deficiency work-up and connective tissue disorder work-up was negative.   PLAN:  1.  Mild to moderate thrombocytopenia: -We reviewed labs from 02/29/2020.  Platelet count has normalized. -Differential diagnosis includes platelet clumping versus immune mediated thrombocytopenia. -I have recommended follow-up visit in 6 months with labs.  Andrew [name] was told to come back sooner should Andrew [name] develop any easy bruising or bleeding.   Orders placed this encounter:  No orders of the defined types were placed in this encounter.    Derek Jack, MD [place] [phone]   I, Milinda Antis, am acting as a scribe for Dr. Sanda Linger.  I, Derek Jack MD,  have reviewed the above documentation for accuracy and completeness, and I agree with the above.

## 2020-03-15 NOTE — Patient Instructions (Signed)
[place] at [facility] Discharge Instructions  You were seen today by Dr. Delton Coombes. He went over your recent results, including your abnormal blood counts and possible diagnoses. Please continue your regular care with your primary care doctor. Dr. Delton Coombes will see you back in 6 months for labs and follow up.   Thank you for choosing [place] at [facility] to provide your oncology and hematology care.  To afford each patient quality time with our provider, please arrive at least 15 minutes before your scheduled appointment time.   If you have a lab appointment with the [facility] please come in thru the  Main Entrance and check in at the main information desk  You need to re-schedule your appointment should you arrive 10 or more minutes late.  We strive to give you quality time with our providers, and arriving late affects you and other patients whose appointments are after yours.  Also, if you no show three or more times for appointments you may be dismissed from the clinic at the providers discretion.     Again, thank you for choosing [facility]  Our hope is that these requests will decrease the amount of time that you wait before being seen by our physicians.       _____________________________________________________________  Should you have questions after your visit to [facility], please contact our office at [phone] between the hours of 8:00 a.m. and 4:30 p.m.  Voicemails left after 4:00 p.m. will not be returned until the following business day.  For prescription refill requests, have your pharmacy contact our office and allow 72 hours.    Cancer Center Support Programs:   > Cancer Support Group  2nd Tuesday of the month 1pm-2pm, Journey Room

## 2020-05-31 ENCOUNTER — Telehealth: Payer: Self-pay | Admitting: *Deleted

## 2020-05-31 ENCOUNTER — Other Ambulatory Visit: Payer: Self-pay

## 2020-05-31 ENCOUNTER — Other Ambulatory Visit: Payer: Medicare HMO | Admitting: *Deleted

## 2020-05-31 ENCOUNTER — Other Ambulatory Visit: Payer: Self-pay | Admitting: *Deleted

## 2020-05-31 DIAGNOSIS — Z79899 Other long term (current) drug therapy: Secondary | ICD-10-CM

## 2020-05-31 DIAGNOSIS — D696 Thrombocytopenia, unspecified: Secondary | ICD-10-CM

## 2020-05-31 DIAGNOSIS — R7303 Prediabetes: Secondary | ICD-10-CM | POA: Diagnosis not present

## 2020-05-31 DIAGNOSIS — E785 Hyperlipidemia, unspecified: Secondary | ICD-10-CM

## 2020-05-31 DIAGNOSIS — I10 Essential (primary) hypertension: Secondary | ICD-10-CM

## 2020-05-31 NOTE — Telephone Encounter (Signed)
Pt here today for lab work.  We are unable to locate any orders.  He has an appt scheduled today with the lab and appt notes read per Dr Jenna Luo - CBC with Diff/platelet/CMP/Lipid/Hemoglobin A1c, TSH, Sedimentation rate.  Call and spoke with Dr Samella Parr office who states to draw the CBC, CMP, HA1c.  Our lab is aware.  Orders will be placed for Dr Dennard Schaumann as instructed.

## 2020-05-31 NOTE — Telephone Encounter (Signed)
Patient came in this morning for labs.  He says he was contacted from our office to come in for labs today prior to his up coming appointment with Dr. Acie Fredrickson 8/24.  There is no documentation or orders in for him.  He drove from [place] to get labs.  Will place orders for lipid, liver, BMP and CBC.  Patient was appreciative of help from staff and getting his labs taken care of.

## 2020-06-01 LAB — COMPREHENSIVE METABOLIC PANEL
ALT: 11 IU/L (ref 0–44)
AST: 14 IU/L (ref 0–40)
Albumin/Globulin Ratio: 2.4 — ABNORMAL HIGH (ref 1.2–2.2)
Albumin: 4.3 g/dL (ref 3.6–4.6)
Alkaline Phosphatase: 71 IU/L (ref 48–121)
BUN/Creatinine Ratio: 26 — ABNORMAL HIGH (ref 10–24)
BUN: 22 mg/dL (ref 8–27)
Bilirubin Total: 0.5 mg/dL (ref 0.0–1.2)
CO2: 23 mmol/L (ref 20–29)
Calcium: 9.1 mg/dL (ref 8.6–10.2)
Chloride: 101 mmol/L (ref 96–106)
Creatinine, Ser: 0.86 mg/dL (ref 0.76–1.27)
GFR calc Af Amer: 94 mL/min/{1.73_m2} (ref >59)
GFR calc non Af Amer: 81 mL/min/{1.73_m2} (ref >59)
Globulin, Total: 1.8 g/dL (ref 1.5–4.5)
Glucose: 165 mg/dL — ABNORMAL HIGH (ref 65–99)
Potassium: 4.1 mmol/L (ref 3.5–5.2)
Sodium: 137 mmol/L (ref 134–144)
Total Protein: 6.1 g/dL (ref 6.0–8.5)

## 2020-06-01 LAB — LIPID PANEL
Chol/HDL Ratio: 3.1 ratio (ref 0.0–5.0)
Cholesterol, Total: 115 mg/dL (ref 100–199)
HDL: 37 mg/dL — ABNORMAL LOW (ref >39)
LDL Chol Calc (NIH): 58 mg/dL (ref 0–99)
Triglycerides: 109 mg/dL (ref 0–149)
VLDL Cholesterol Cal: 20 mg/dL (ref 5–40)

## 2020-06-01 LAB — CBC
Hematocrit: 41.9 % (ref 37.5–51.0)
Hemoglobin: 14.4 g/dL (ref 13.0–17.7)
MCH: 30 pg (ref 26.6–33.0)
MCHC: 34.4 g/dL (ref 31.5–35.7)
MCV: 87 fL (ref 79–97)
RBC: 4.8 x10E6/uL (ref 4.14–5.80)
RDW: 12.4 % (ref 11.6–15.4)
WBC: 7.4 10*3/uL (ref 3.4–10.8)

## 2020-06-01 LAB — HEMOGLOBIN A1C
Est. average glucose Bld gHb Est-mCnc: 160 mg/dL
Hgb A1c MFr Bld: 7.2 % — ABNORMAL HIGH (ref 4.8–5.6)

## 2020-06-01 LAB — HEPATIC FUNCTION PANEL: Bilirubin, Direct: 0.15 mg/dL (ref 0.00–0.40)

## 2020-06-04 ENCOUNTER — Encounter: Payer: Self-pay | Admitting: Cardiovascular Disease

## 2020-06-04 ENCOUNTER — Other Ambulatory Visit: Payer: Self-pay

## 2020-06-04 ENCOUNTER — Ambulatory Visit: Payer: Medicare HMO | Admitting: Cardiovascular Disease

## 2020-06-04 VITALS — BP 128/64 | HR 71 | Ht 69.0 in | Wt 160.4 lb

## 2020-06-04 DIAGNOSIS — E785 Hyperlipidemia, unspecified: Secondary | ICD-10-CM

## 2020-06-04 DIAGNOSIS — I779 Disorder of arteries and arterioles, unspecified: Secondary | ICD-10-CM | POA: Diagnosis not present

## 2020-06-04 DIAGNOSIS — I2510 Atherosclerotic heart disease of native coronary artery without angina pectoris: Secondary | ICD-10-CM | POA: Diagnosis not present

## 2020-06-04 DIAGNOSIS — I10 Essential (primary) hypertension: Secondary | ICD-10-CM | POA: Diagnosis not present

## 2020-06-04 NOTE — Progress Notes (Signed)
Andrew [name] Date of Birth  30-Jun-1941 [facility] [address]    [place], [place]  [address] [phone]  Fax  [phone]   problem list 1. Coronary artery disease 2. Hyperlipidemia 3.  Previous notes.   Andrew [name] is a 79 year old gentleman with a history of coronary artery disease. He status post coronary artery bypass grafting. He also has a history of dyslipidemia.  He's recently been having some problems with back pain.  These episodes of back pain would typically occur when he is doing his normal household chores. It would typically occur in the middle of his back and radiate up through to the front of his chest and between the shoulder blades.  This was not similar to his previous episodes of angina prior to his bypass grafting.  He's been exercising on a regular basis and has not had these symptoms with exercise.   Sept. 11. 2014:  Andrew [name] is doing ok.  No angina.  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  No   Sept. 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  Raised a garden this years.  No CP. Works out regularly.    Oct. 4, 2016: Doing great.    i reviewed his labs with him.   Everything is stable .  Still raising cattle.  Beef prices are down quite a bit this year.    Oct. 24, 2017:   Doing well Still raising cows.    No CP .    September 29, 2017:  Andrew [name] is seen today.  He has a history of coronary artery disease and hyperlipidemia. No CP  Has some shortness of breath. Has had some lower back pain and abd.  Still raising cows .   Hurt his right arm while using his  tractor   His last heart catheterization was Feb 14, 2007. The left anterior descending artery is occluded.  The saphenous vein graft to the second diagonal artery is a large graft and fills the left anterior descending artery in a retrograde fashion.  There is a 90% stenosis in the mid/distal LAD. The LIMA is atretic and does not supply any significant flow to the  LAD. Left circumflex artery has minor luminal irregularities.  The left circumflex artery is dominant.   May 11, 2018:  Doing well.   Treadmill for about 15 to 20 minutes each day.  No episodes of chest pain.  Has occasional swelling in rignht ankle but this is related to SVG harvest  Recent lipid panel from May, 2019 all look good.  Total cholesterol is 100.  HDL is 36.  The LDL is 50.  The triglyceride level is 62. Does not take NTG   Aug. 25, 2020 :  Andrew [name] is seen today for follow up visit Hx of CABG in 1998 and then again in 2008 Doing well.  No cp or dyspnea . Still puts in a garden.   Raises cows.  Limited by a right shoulder injury .   June 04, 2020: Andrew [name] is seen today for a follow-up visit.  He has a history of coronary artery disease and coronary artery bypass grafting x2. No cp,  No dsypnea.    Current Outpatient Medications on File Prior to Visit  Medication Sig Dispense Refill  . aspirin 81 MG tablet Take 81 mg by mouth daily.      . Cholecalciferol (VITAMIN D) 2000 units CAPS Take 1 capsule by mouth daily.    [name]  finasteride (PROSCAR) 5 MG tablet TAKE 1 TABLET(5 MG) BY MOUTH DAILY. 90 tablet 3  . lansoprazole (PREVACID) 30 MG capsule Take 1 capsule (30 mg total) by mouth daily. 90 capsule 3  . metoprolol succinate (TOPROL-XL) 25 MG 24 hr tablet Take 0.5 tablets (12.5 mg total) by mouth daily. 45 tablet 3  . Multiple Vitamin (MULTIVITAMIN) tablet Take 1 tablet by mouth daily.      . nitroGLYCERIN (NITROSTAT) 0.4 MG SL tablet Place 1 tablet (0.4 mg total) under the tongue every 5 (five) minutes as needed. 75 tablet 3  . Omega-3 Fatty Acids (FISH OIL) 1000 MG CPDR Take 1,000 mg by mouth every morning.     . simvastatin (ZOCOR) 10 MG tablet TAKE 1 TABLET(10 MG) BY MOUTH AT BEDTIME 90 tablet 3  . tamsulosin (FLOMAX) 0.4 MG CAPS capsule Take 0.4 mg by mouth at bedtime.     Current Facility-Administered Medications on File Prior to Visit  Medication Dose Route Frequency  Provider Last Rate Last Admin  . 0.9 %  sodium chloride infusion  500 mL Intravenous Once Armbruster, Carlota Raspberry, MD        Allergies  Allergen Reactions  . Imdur [Isosorbide Mononitrate]     Unknown, per pt   . Meloxicam Swelling    Facial swelling     Past Medical History:  Diagnosis Date  . Allergy    grass etc.  . Barrett's esophagus   . BPH (benign prostatic hyperplasia)   . Chest pain, atypical   . Chronic kidney disease    kidney stones  . Coronary artery disease   . GERD (gastroesophageal reflux disease)    Barrett's  . Hernia   . Hyperlipidemia   . Hypertension   . Myocardial infarction ([place])   . Thrombocytopenia ([place])   . Ulcer 1960    Past Surgical History:  Procedure Laterality Date  . CARDIAC CATHETERIZATION  02/14/2007   MITRAL REGURGITATION. LV NORMAL  . CHOLECYSTECTOMY    . COLONOSCOPY    . CORONARY ARTERY BYPASS GRAFT     twice  2 by passes each time  . INGUINAL HERNIA REPAIR     bilateral done twice  . LITHOTRIPSY    . UPPER GASTROINTESTINAL ENDOSCOPY      Social History   Tobacco Use  Smoking Status Never Smoker  Smokeless Tobacco Never Used    Social History   Substance and Sexual Activity  Alcohol Use No    Family History  Problem Relation Age of Onset  . Stroke Father   . Heart attack Mother   . Heart attack Maternal Grandmother   . Heart attack Maternal Grandfather   . Heart disease Sister   . Heart disease Brother   . Heart disease Sister   . Heart disease Brother   . Prostate cancer Brother   . Colon cancer Neg Hx   . Esophageal cancer Neg Hx   . Rectal cancer Neg Hx   . Stomach cancer Neg Hx     Reviw of Systems:   Noted in current history.  Physical Exam: Blood pressure 128/64, pulse 71, height 5\' 9"  (1.753 m), weight 160 lb 6.4 oz (72.8 kg), SpO2 98 %.  GEN:  Well nourished, well developed in no acute distress HEENT: Normal NECK: No JVD; No carotid bruits LYMPHATICS: No lymphadenopathy CARDIAC: RRR , no  murmurs, rubs, gallops RESPIRATORY:  Clear to auscultation without rales, wheezing or rhonchi  ABDOMEN: Soft, non-tender, non-distended MUSCULOSKELETAL:  No edema; No deformity  SKIN: Warm and dry NEUROLOGIC:  Alert and oriented x 3  ECG:    June 04, 2020: Normal sinus rhythm at 71.  No ST or T wave changes..     Assessment / Plan:    1. Coronary artery disease-doing well.  He is not having any episodes of angina.  2. Hyperlipidemia-      recent labs look okay.  Cholesterol levels are good.  His glucose levels remain elevated.  I have advised him to just to discuss this with his primary medical doctor.  3. Carotid artery disease:   Carotids remained stable.   Mertie Moores, MD  06/04/2020 10:38 AM    [facility] [place],  [place], [place]  [address] Pager [phone] Phone: [phone]; Fax: [phone]

## 2020-06-04 NOTE — Patient Instructions (Signed)
Medication Instructions:  Your physician recommends that you continue on your current medications as directed. Please refer to the Current Medication list given to you today.  *If you need a refill on your cardiac medications before your next appointment, please call your pharmacy*   Lab Work: Your physician recommends that you return for lab work in: 12 months on the day of or a few days before your office visit. You will need to FAST for this appointment - nothing to eat or drink after midnight the night before except water.  If you have labs (blood work) drawn today and your tests are completely normal, you will receive your results only by: MyChart Message (if you have MyChart) OR A paper copy in the mail If you have any lab test that is abnormal or we need to change your treatment, we will call you to review the results.   Testing/Procedures: None Ordered   Follow-Up: At [facility], you and your health needs are our priority.  As part of our continuing mission to provide you with exceptional heart care, we have created designated Provider Care Teams.  These Care Teams include your primary Cardiologist (physician) and Advanced Practice Providers (APPs -  Physician Assistants and Nurse Practitioners) who all work together to provide you with the care you need, when you need it.  We recommend signing up for the patient portal called "MyChart".  Sign up information is provided on this After Visit Summary.  MyChart is used to connect with patients for Virtual Visits (Telemedicine).  Patients are able to view lab/test results, encounter notes, upcoming appointments, etc.  Non-urgent messages can be sent to your provider as well.   To learn more about what you can do with MyChart, go to [email or web address]://www.mychart.com.    Your next appointment:   1 year(s)  The format for your next appointment:   In Person  Provider:   You may see Philip Nahser, MD or one of the following Advanced Practice  Providers on your designated Care Team:   Scott Weaver, PA-C Vin Bhagat, PA-C   

## 2020-06-11 ENCOUNTER — Other Ambulatory Visit: Payer: Self-pay | Admitting: Family Medicine

## 2020-06-13 ENCOUNTER — Telehealth: Payer: Self-pay | Admitting: Family Medicine

## 2020-06-13 NOTE — Progress Notes (Signed)
  Chronic Care Management   Outreach Note  06/13/2020 Name: Andrew [name] MRN: [id number] DOB: Apr 17, 1941  Referred by: Susy Frizzle, MD Reason for referral : No chief complaint on file.   An unsuccessful telephone outreach was attempted today. The patient was referred to the pharmacist for assistance with care management and care coordination.   Follow Up Plan:   Carley Perdue [employer] Scheduler

## 2020-06-14 ENCOUNTER — Other Ambulatory Visit: Payer: Self-pay | Admitting: Family Medicine

## 2020-06-14 ENCOUNTER — Telehealth: Payer: Self-pay | Admitting: Family Medicine

## 2020-06-14 NOTE — Progress Notes (Signed)
  Chronic Care Management   Note  06/14/2020 Name: Andrew [name] MRN: [id number] DOB: April 06, 1941  Andrew [name] is a 79 y.o. year old male who is a primary care patient of Susy Frizzle, MD. I reached out to [name] by phone today in response to a referral sent by [name]'s PCP, Susy Frizzle, MD.   [name] was given information about Chronic Care Management services today including:  1. CCM service includes personalized support from designated clinical staff supervised by his physician, including individualized plan of care and coordination with other care providers 2. 24/7 contact phone numbers for assistance for urgent and routine care needs. 3. Service will only be billed when office clinical staff spend 20 minutes or more in a month to coordinate care. 4. Only one practitioner may furnish and bill the service in a calendar month. 5. The patient may stop CCM services at any time (effective at the end of the month) by phone call to the office staff.   Patient agreed to services and verbal consent obtained.   Follow up plan:   Carley Perdue [employer] Scheduler

## 2020-06-18 ENCOUNTER — Other Ambulatory Visit: Payer: Self-pay

## 2020-06-18 ENCOUNTER — Ambulatory Visit (INDEPENDENT_AMBULATORY_CARE_PROVIDER_SITE_OTHER): Payer: Medicare HMO | Admitting: Family Medicine

## 2020-06-18 VITALS — BP 130/70 | HR 68 | Temp 97.0°F | Ht 69.0 in | Wt 160.0 lb

## 2020-06-18 DIAGNOSIS — Z23 Encounter for immunization: Secondary | ICD-10-CM | POA: Diagnosis not present

## 2020-06-18 DIAGNOSIS — I2510 Atherosclerotic heart disease of native coronary artery without angina pectoris: Secondary | ICD-10-CM | POA: Diagnosis not present

## 2020-06-18 DIAGNOSIS — I10 Essential (primary) hypertension: Secondary | ICD-10-CM

## 2020-06-18 DIAGNOSIS — L309 Dermatitis, unspecified: Secondary | ICD-10-CM

## 2020-06-18 DIAGNOSIS — E118 Type 2 diabetes mellitus with unspecified complications: Secondary | ICD-10-CM | POA: Diagnosis not present

## 2020-06-18 NOTE — Progress Notes (Signed)
Subjective:    Patient ID: Andrew [name], male    DOB: 09-Sep-1941, 79 y.o.   MRN: [id number]  HPI Patient has several concerns.  First his most recent lab work is listed below: Lab on 05/31/2020  Component Date Value Ref Range Status   WBC 05/31/2020 7.4  3.4 - 10.8 x10E3/uL Final   RBC 05/31/2020 4.80  4.14 - 5.80 x10E6/uL Final   Hemoglobin 05/31/2020 14.4  13.0 - 17.7 g/dL Final   Hematocrit 05/31/2020 41.9  37.5 - 51.0 % Final   MCV 05/31/2020 87  79 - 97 fL Final   MCH 05/31/2020 30.0  26.6 - 33.0 pg Final   MCHC 05/31/2020 34.4  31 - 35 g/dL Final   RDW 05/31/2020 12.4  11.6 - 15.4 % Final   Platelets 05/31/2020 CANCELED  x10E3/uL Final-Edited   Comment: Unable to perform an accurate platelet count due to aggregation of the platelets.  Result canceled by the ancillary.    Hematology Comments: 05/31/2020 Note:   Final   Verified by microscopic examination.   Glucose 05/31/2020 165* 65 - 99 mg/dL Final   BUN 05/31/2020 22  8 - 27 mg/dL Final   Creatinine, Ser 05/31/2020 0.86  0.76 - 1.27 mg/dL Final   GFR calc non Af Amer 05/31/2020 81  >59 mL/min/1.73 Final   GFR calc Af Amer 05/31/2020 94  >59 mL/min/1.73 Final   Comment: **[facility] currently reports eGFR in compliance with the current**   recommendations of the [employer]. [facility] will   update reporting as new guidelines are published from the NKF-ASN   Task force.    BUN/Creatinine Ratio 05/31/2020 26* 10 - 24 Final   Sodium 05/31/2020 137  134 - 144 mmol/L Final   Potassium 05/31/2020 4.1  3.5 - 5.2 mmol/L Final   Chloride 05/31/2020 101  96 - 106 mmol/L Final   CO2 05/31/2020 23  20 - 29 mmol/L Final   Calcium 05/31/2020 9.1  8.6 - 10.2 mg/dL Final   Total Protein 05/31/2020 6.1  6.0 - 8.5 g/dL Final   Albumin 05/31/2020 4.3  3.6 - 4.6 g/dL Final   Globulin, Total 05/31/2020 1.8  1.5 - 4.5 g/dL Final   Albumin/Globulin Ratio 05/31/2020 2.4* 1.2 - 2.2 Final   Bilirubin  Total 05/31/2020 0.5  0.0 - 1.2 mg/dL Final   Alkaline Phosphatase 05/31/2020 71  48 - 121 IU/L Final   AST 05/31/2020 14  0 - 40 IU/L Final   ALT 05/31/2020 11  0 - 44 IU/L Final   Hgb A1c MFr Bld 05/31/2020 7.2* 4.8 - 5.6 % Final   Comment:          Prediabetes: 5.7 - 6.4          Diabetes: >6.4          Glycemic control for adults with diabetes: <7.0    Est. average glucose Bld gHb Est-m* 05/31/2020 160  mg/dL Final   Cholesterol, Total 05/31/2020 115  100 - 199 mg/dL Final   Triglycerides 05/31/2020 109  0 - 149 mg/dL Final   HDL 05/31/2020 37* >39 mg/dL Final   VLDL Cholesterol Cal 05/31/2020 20  5 - 40 mg/dL Final   LDL Chol Calc (NIH) 05/31/2020 58  0 - 99 mg/dL Final   Chol/HDL Ratio 05/31/2020 3.1  0.0 - 5.0 ratio Final   Comment:  T. Chol/HDL Ratio                                             Men  Women                               1/2 Avg.Risk  3.4    3.3                                   Avg.Risk  5.0    4.4                                2X Avg.Risk  9.6    7.1                                3X Avg.Risk 23.4   11.0    Bilirubin, Direct 05/31/2020 0.15  0.00 - 0.40 mg/dL Final   Hemoglobin A1c is up to 7.2.  Given his age, an acceptable goal would be less than 7.  He wants to know what options exist to help lower his blood sugar.  Metformin is a poor choice for this patient due to his history of chronic diarrhea.  I am concerned that glipizide would cause potential hypoglycemic episodes that given his age could be dangerous.  Actos would be a generic option however I tend to avoid that in patients with a history of coronary artery disease due to potential fluid retention.  Jardiance would be an excellent option for him however it does increase cost and also would increase polyuria and the patient already deals with BPH.  Second the patient questions if finasteride could be contributing to muscle loss.  I explained that it helps prevent  the growth of prostate tissue due to testosterone potentially there could be some systemic side effects including decreasing muscle mass however I think this is unlikely.  Third the patient wants to know what medication he can use for allergies that will not exacerbate his prostate.  I recommended Zyrtec and Flonase.  Fourth the patient has small erythematous papules on both feet.  There are 3 on the right foot.  Each is approximately 3 to 4 mm in diameter with a central brown macule that is about 1 to 2 mm in diameter.  They itch.  He attributes these to some type of bug bite.  However they occur in areas covered by his shoes and socks.  He does not go barefoot outside at night.  They come and go only on his feet.  I believe this could potentially be a fungal infection. Past Medical History:  Diagnosis Date   Allergy    grass etc.   Barrett's esophagus    BPH (benign prostatic hyperplasia)    Chest pain, atypical    Chronic kidney disease    kidney stones   Coronary artery disease    GERD (gastroesophageal reflux disease)    Barrett's   Hernia    Hyperlipidemia    Hypertension    Myocardial infarction ([place])    Thrombocytopenia ([place])    Ulcer 1960   Past Surgical History:  Procedure  Laterality Date   CARDIAC CATHETERIZATION  02/14/2007   MITRAL REGURGITATION. LV NORMAL   CHOLECYSTECTOMY     COLONOSCOPY     CORONARY ARTERY BYPASS GRAFT     twice  2 by passes each time   INGUINAL HERNIA REPAIR     bilateral done twice   LITHOTRIPSY     UPPER GASTROINTESTINAL ENDOSCOPY     Current Outpatient Medications on File Prior to Visit  Medication Sig Dispense Refill   aspirin 81 MG tablet Take 81 mg by mouth daily.       Cholecalciferol (VITAMIN D) 2000 units CAPS Take 1 capsule by mouth daily.     finasteride (PROSCAR) 5 MG tablet TAKE 1 TABLET EVERY DAY 90 tablet 3   lansoprazole (PREVACID) 30 MG capsule Take 1 capsule (30 mg total) by mouth daily. 90 capsule 3    metoprolol succinate (TOPROL-XL) 25 MG 24 hr tablet Take 0.5 tablets (12.5 mg total) by mouth daily. 45 tablet 3   Multiple Vitamin (MULTIVITAMIN) tablet Take 1 tablet by mouth daily.       nitroGLYCERIN (NITROSTAT) 0.4 MG SL tablet Place 1 tablet (0.4 mg total) under the tongue every 5 (five) minutes as needed. 75 tablet 3   Omega-3 Fatty Acids (FISH OIL) 1000 MG CPDR Take 1,000 mg by mouth every morning.      simvastatin (ZOCOR) 10 MG tablet TAKE 1 TABLET(10 MG) BY MOUTH AT BEDTIME 90 tablet 3   tamsulosin (FLOMAX) 0.4 MG CAPS capsule TAKE 1 CAPSULE (0.4 MG TOTAL) BY MOUTH DAILY. 90 capsule 1   Current Facility-Administered Medications on File Prior to Visit  Medication Dose Route Frequency Provider Last Rate Last Admin   0.9 %  sodium chloride infusion  500 mL Intravenous Once Armbruster, Willaim Rayas, MD       Allergies  Allergen Reactions   Imdur [Isosorbide Mononitrate]     Unknown, per pt    Meloxicam Swelling    Facial swelling    Social History   Socioeconomic History   Marital status: Married    Spouse name: Not on file   Number of children: 2   Years of education: Not on file   Highest education level: Not on file  Occupational History   Occupation: Retired  Tobacco Use   Smoking status: Never Smoker   Smokeless tobacco: Never Used  Building services engineer Use: Never used  Substance and Sexual Activity   Alcohol use: No   Drug use: No   Sexual activity: Not Currently  Other Topics Concern   Not on file  Social History Narrative   Not on file   Social Determinants of Health   Financial Resource Strain:    Difficulty of Paying Living Expenses: Not on file  Food Insecurity:    Worried About Programme researcher, broadcasting/film/video in the Last Year: Not on file   [employer] of Food in the Last Year: Not on file  Transportation Needs:    Lack of Transportation (Medical): Not on file   Lack of Transportation (Non-Medical): Not on file  Physical Activity:    Days  of Exercise per Week: Not on file   Minutes of Exercise per Session: Not on file  Stress:    Feeling of Stress : Not on file  Social Connections:    Frequency of Communication with Friends and Family: Not on file   Frequency of Social Gatherings with Friends and Family: Not on file   Attends Religious Services: Not  on file   Active Member of Clubs or Organizations: Not on file   Attends Archivist Meetings: Not on file   Marital Status: Not on file  Intimate Partner Violence:    Fear of Current or Ex-Partner: Not on file   Emotionally Abused: Not on file   Physically Abused: Not on file   Sexually Abused: Not on file      Review of Systems  All other systems reviewed and are negative.      Objective:   Physical Exam Vitals reviewed.  Constitutional:      Appearance: Normal appearance. He is normal weight.  Cardiovascular:     Rate and Rhythm: Normal rate and regular rhythm.     Heart sounds: Normal heart sounds. No murmur heard.  No friction rub. No gallop.   Pulmonary:     Effort: Pulmonary effort is normal. No respiratory distress.     Breath sounds: Normal breath sounds. No stridor. No wheezing, rhonchi or rales.  Chest:     Chest wall: No tenderness.  Abdominal:     General: Bowel sounds are normal.     Palpations: Abdomen is soft.  Musculoskeletal:     Right lower leg: No edema.     Left lower leg: No edema.  Neurological:     Mental Status: He is alert.     Lab on 05/31/2020  Component Date Value Ref Range Status   WBC 05/31/2020 7.4  3.4 - 10.8 x10E3/uL Final   RBC 05/31/2020 4.80  4.14 - 5.80 x10E6/uL Final   Hemoglobin 05/31/2020 14.4  13.0 - 17.7 g/dL Final   Hematocrit 05/31/2020 41.9  37.5 - 51.0 % Final   MCV 05/31/2020 87  79 - 97 fL Final   MCH 05/31/2020 30.0  26.6 - 33.0 pg Final   MCHC 05/31/2020 34.4  31 - 35 g/dL Final   RDW 05/31/2020 12.4  11.6 - 15.4 % Final   Platelets 05/31/2020 CANCELED  x10E3/uL  Final-Edited   Comment: Unable to perform an accurate platelet count due to aggregation of the platelets.  Result canceled by the ancillary.    Hematology Comments: 05/31/2020 Note:   Final   Verified by microscopic examination.   Glucose 05/31/2020 165* 65 - 99 mg/dL Final   BUN 05/31/2020 22  8 - 27 mg/dL Final   Creatinine, Ser 05/31/2020 0.86  0.76 - 1.27 mg/dL Final   GFR calc non Af Amer 05/31/2020 81  >59 mL/min/1.73 Final   GFR calc Af Amer 05/31/2020 94  >59 mL/min/1.73 Final   Comment: **[facility] currently reports eGFR in compliance with the current**   recommendations of the [employer]. [facility] will   update reporting as new guidelines are published from the NKF-ASN   Task force.    BUN/Creatinine Ratio 05/31/2020 26* 10 - 24 Final   Sodium 05/31/2020 137  134 - 144 mmol/L Final   Potassium 05/31/2020 4.1  3.5 - 5.2 mmol/L Final   Chloride 05/31/2020 101  96 - 106 mmol/L Final   CO2 05/31/2020 23  20 - 29 mmol/L Final   Calcium 05/31/2020 9.1  8.6 - 10.2 mg/dL Final   Total Protein 05/31/2020 6.1  6.0 - 8.5 g/dL Final   Albumin 05/31/2020 4.3  3.6 - 4.6 g/dL Final   Globulin, Total 05/31/2020 1.8  1.5 - 4.5 g/dL Final   Albumin/Globulin Ratio 05/31/2020 2.4* 1.2 - 2.2 Final   Bilirubin Total 05/31/2020 0.5  0.0 - 1.2 mg/dL Final  Alkaline Phosphatase 05/31/2020 71  48 - 121 IU/L Final   AST 05/31/2020 14  0 - 40 IU/L Final   ALT 05/31/2020 11  0 - 44 IU/L Final   Hgb A1c MFr Bld 05/31/2020 7.2* 4.8 - 5.6 % Final   Comment:          Prediabetes: 5.7 - 6.4          Diabetes: >6.4          Glycemic control for adults with diabetes: <7.0    Est. average glucose Bld gHb Est-m* 05/31/2020 160  mg/dL Final   Cholesterol, Total 05/31/2020 115  100 - 199 mg/dL Final   Triglycerides 05/31/2020 109  0 - 149 mg/dL Final   HDL 05/31/2020 37* >39 mg/dL Final   VLDL Cholesterol Cal 05/31/2020 20  5 - 40 mg/dL Final   LDL Chol Calc (NIH)  05/31/2020 58  0 - 99 mg/dL Final   Chol/HDL Ratio 05/31/2020 3.1  0.0 - 5.0 ratio Final   Comment:                                   T. Chol/HDL Ratio                                             Men  Women                               1/2 Avg.Risk  3.4    3.3                                   Avg.Risk  5.0    4.4                                2X Avg.Risk  9.6    7.1                                3X Avg.Risk 23.4   11.0    Bilirubin, Direct 05/31/2020 0.15  0.00 - 0.40 mg/dL Final         Assessment & Plan:  Need for immunization against influenza - Plan: Flu Vaccine QUAD High Dose(Fluad) Need for immunization against influenza - Plan: Flu Vaccine QUAD High Dose(Fluad)  Dermatitis  Controlled type 2 diabetes mellitus with complication, without long-term current use of insulin (HCC)  ASCVD (arteriosclerotic cardiovascular disease)  Benign essential HTN  Patient also reports decreased hearing.  He has cerumen impactions in both ears.  This was removed with irrigation and lavage.  Patient received a flu shot today.  His blood pressures well controlled at 130/70.  We discussed the risk and benefits of treating his elevated blood sugar.  Given his age, I am comfortable allowing him to work only on diet to help manage his A1c of 7.2.  If his A1c worsens in 3 months I would recommend Jardiance.  I recommended Zyrtec and/or Flonase for allergies.  I recommended over-the-counter Lotrimin cream for the rash on his feet as I believe he may have a  slight fungal infection on the feet.  Cholesterol is acceptable.

## 2020-07-01 DIAGNOSIS — X32XXXD Exposure to sunlight, subsequent encounter: Secondary | ICD-10-CM | POA: Diagnosis not present

## 2020-07-01 DIAGNOSIS — D225 Melanocytic nevi of trunk: Secondary | ICD-10-CM | POA: Diagnosis not present

## 2020-07-01 DIAGNOSIS — L821 Other seborrheic keratosis: Secondary | ICD-10-CM | POA: Diagnosis not present

## 2020-07-01 DIAGNOSIS — D485 Neoplasm of uncertain behavior of skin: Secondary | ICD-10-CM | POA: Diagnosis not present

## 2020-07-01 DIAGNOSIS — L570 Actinic keratosis: Secondary | ICD-10-CM | POA: Diagnosis not present

## 2020-07-01 DIAGNOSIS — D2261 Melanocytic nevi of right upper limb, including shoulder: Secondary | ICD-10-CM | POA: Diagnosis not present

## 2020-07-31 NOTE — Chronic Care Management (AMB) (Addendum)
Chronic Care Management Pharmacy  Name: Andrew [name]  MRN: [id number] DOB: [birth year]   Chief Complaint/ HPI  Andrew [name],  79 y.o. , male presents for their Initial CCM visit with the clinical pharmacist In office.  PCP : Andrew Frizzle, MD  Their chronic conditions include: HTN, CAD, Barrett's Esophagus, HLD, Pre-DM.  Office Visits: 06/18/2020 (Andrew [name]) -  A1c continues to be elevated, has a history of chronic diarrhea so metformin would be a poor choice.  Blood pressures are controlled.  He is working on his diet for his blood sugar, if this does not improve at 3 month check the plan was to try Jardiance.  Zyrtec and Flonase given for allergy symptoms.  02/15/2020 (Andrew [name]) -  Platelet count has decreased again, dealing with thrombocytopenia for 3 years.  He denies bleeding, SOB.  Hematology referral placed.  All other chronic conditions well managed.  Consult Visit: 06/04/2020 (Cardio) - Mention of elevated glucose levels, no angina and all other levels remain the same and controlled.  Medications: Outpatient Encounter Medications as of 08/02/2020  Medication Sig   aspirin 81 MG tablet Take 81 mg by mouth daily.     Cholecalciferol (VITAMIN D) 2000 units CAPS Take 1 capsule by mouth daily.   finasteride (PROSCAR) 5 MG tablet TAKE 1 TABLET EVERY DAY   lansoprazole (PREVACID) 30 MG capsule Take 1 capsule (30 mg total) by mouth daily.   metoprolol succinate (TOPROL-XL) 25 MG 24 hr tablet Take 0.5 tablets (12.5 mg total) by mouth daily.   Multiple Vitamin (MULTIVITAMIN) tablet Take 1 tablet by mouth daily.     nitroGLYCERIN (NITROSTAT) 0.4 MG SL tablet Place 1 tablet (0.4 mg total) under the tongue every 5 (five) minutes as needed.   Omega-3 Fatty Acids (FISH OIL) 1000 MG CPDR Take 1,000 mg by mouth every morning.    simvastatin (ZOCOR) 10 MG tablet TAKE 1 TABLET(10 MG) BY MOUTH AT BEDTIME   tamsulosin (FLOMAX) 0.4 MG CAPS capsule TAKE 1 CAPSULE (0.4 MG TOTAL) BY MOUTH  DAILY.   Facility-Administered Encounter Medications as of 08/02/2020  Medication   0.9 %  sodium chloride infusion     Current Diagnosis/Assessment:   Emergency planning/management officer Strain:    Difficulty of Paying Living Expenses: Not on file    Goals Addressed             This [place]:       [place] (see longitudinal plan of care for additional care plan information)  Current Barriers:  Chronic Disease Management support, education, and care coordination needs related to Hypertension, Hyperlipidemia, and Pre-DM   Hypertension BP Readings from Last 3 Encounters:  06/18/20 130/70  06/04/20 128/64  03/15/20 126/69   Pharmacist Clinical Goal(s): Over the next 180 days, patient will work with PharmD and providers to maintain BP goal <130/80 Current regimen:  Metoprolol XL 34m one-half tablet daily Interventions: Reviewed most recent BP Patient self care activities - Over the next 180 days, patient will: Check BP periodically, document, and provide at future appointments Ensure daily salt intake < 2300 mg/day  Hyperlipidemia Lab Results  Component Value Date/Time   LDLCALC 58 05/31/2020 08:48 AM   LDLCALC 55 02/08/2020 08:02 AM   Pharmacist Clinical Goal(s): Over the next 180 days, patient will work with PharmD and providers to maintain LDL goal < 70 Current regimen:  Simvastatin 137mInterventions: Discussed goals for total cholesterol < 200 and LDL < 70 Counseled  on benefits of statin medications Patient self care activities - Over the next 180 days, patient will: Continue to focus on medication adherence  Notify providers of any new or worsening muscle pains  Diabetes Lab Results  Component Value Date/Time   HGBA1C 7.2 (H) 05/31/2020 08:48 AM   HGBA1C 6.6 (H) 02/08/2020 08:02 AM   Pharmacist Clinical Goal(s): Over the next 180 days, patient will work with PharmD and providers to achieve A1c goal <7% Current regimen:  No  medications Interventions: Reviewed most recent A1c Discussed diabetic diet and limiting carbs to one source per meal Patient self care activities - Over the next 180 days, patient will: Check blood sugar at MD visit, document, and provide at future appointments Continue to work on dietary modifications limiting carbohydrates and sweets  Initial goal documentation         Hypertension   BP goal is:  <130/80  Office blood pressures are  BP Readings from Last 3 Encounters:  06/18/20 130/70  06/04/20 128/64  03/15/20 126/69   Patient checks BP at home infrequently Patient home BP readings are ranging: none ntoed  Patient has failed these meds in the past: Imdur Patient is currently controlled on the following medications:  Metoprolol XL 47m one-half tablet po daily  We discussed  BP is controlled He does not check at home which at the time I think is appropriate Reports adherence with medication Does get on treadmill for about 15 minutes each morning  Plan  Continue current medications     Hyperlipidemia   LDL goal < 70  Lipid Panel     Component Value Date/Time   CHOL 115 05/31/2020 0848   TRIG 109 05/31/2020 0848   HDL 37 (L) 05/31/2020 0848   LDLCALC 58 05/31/2020 0848   LDLCALC 55 02/08/2020 0802    Hepatic Function Latest Ref Rng & Units 05/31/2020 02/29/2020 02/08/2020  Total Protein 6.0 - 8.5 g/dL 6.1 6.4(L) 5.8(L)  Albumin 3.6 - 4.6 g/dL 4.3 4.0 -  AST 0 - 40 IU/L _0 ALT 0 - 44 IU/L _1 Alk Phosphatase 48 - 121 IU/L 71 58 -  Total Bilirubin 0.0 - 1.2 mg/dL 0.5 0.7 0.6  Bilirubin, Direct 0.00 - 0.40 mg/dL 0.15 - -     The ASCVD Risk score (GCarmi, et al., 2013) failed to calculate for the following reasons:   The 2013 ASCVD risk score is only valid for ages 413to 79  The patient has a prior MI or stroke diagnosis   Patient has failed these meds in past: none noted Patient is currently controlled on the following medications:    Simvastatin 18m We discussed:   Patient concerned about muscle loss Counseled on benefits of statins, especially with history of CAD Recommended patient continue medication as log as it is tolerated, he is agreeable to this plac  Plan  Continue current medications Diabetes   A1c goal <7%  Recent Relevant Labs: Lab Results  Component Value Date/Time   HGBA1C 7.2 (H) 05/31/2020 08:48 AM   HGBA1C 6.6 (H) 02/08/2020 08:02 AM   GFR 91.76 07/08/2015 07:39 AM   GFR 86.83 11/11/2011 08:49 AM   MICROALBUR 0.6 02/15/2020 08:45 AM    Last diabetic Eye exam: No results found for: HMDIABEYEEXA  Last diabetic Foot exam: No results found for: HMDIABFOOTEX   Checking BG: Never   Patient has failed these meds in past: none noted Patient is currently uncontrolled on  the following medications: None noted  We discussed:  A1c has gone up as of most recent labs Counseled on dietary changes the patient could make: less carbohydrates and sweet snacks He drinks mainly water, and almost never drinks a soda Patient has appointment in November for follow up, recommend A1c to see progression over the last 3 months, if elevated still may need to start Metformin  Plan  Continue current medications, recheck A1c to determin next steps. Barrett's Esophagus   Patient has failed these meds in past: none noted Patient is currently controlled on the following medications:  Lansoprazole 60m daily  We discussed:  Takes daily no symptoms Indication for long term therapy  Plan  Continue current medications CAD   Patient has failed these meds in past: none noted Patient is currently controlled on the following medications:  ASA 81m We discussed:  Denies abnormal bruising or bleeding Controlling his risk factors No concerns at this time  Plan  Continue current medications  Vaccines   Reviewed and discussed patient's vaccination history.    Immunization History  Administered  Date(s) Administered   Fluad Quad(high Dose 65+) 06/30/2019, 06/18/2020   Influenza Split 07/07/2012   Influenza Whole 07/18/2010   Influenza, High Dose Seasonal PF 08/16/2017, 09/07/2018   Influenza,inj,Quad PF,6+ Mos 07/24/2013, 09/10/2014, 08/30/2015, 08/21/2016   Moderna SARS-COVID-2 Vaccination 12/20/2019, 01/17/2020   Pneumococcal Conjugate-13 09/10/2014   Pneumococcal Polysaccharide-23 08/16/2007   Td 07/30/2003   Zoster 06/11/2006    Plan  Recommended patient receive Shingrix vaccine in pharmacy. Medication Management   Miscellaneous medications:  Finasteride 60m60mamsulosin 0.4mg80mC's:  ASA 81mg64mamin D Omega-3  Patient currently uses [facility] mail order pharmacy.   Patient reports using pill box method to organize medications and promote adherence. Patient denies missed doses of medication.   ChrisBeverly MilchrmD Clinical Pharmacist [place])[phone] collaborated with the care management provider regarding care management and care coordination activities outlined in this encounter and have reviewed this encounter including documentation in the note and care plan. I am certifying that I agree with the content of this note and encounter as supervising physician.

## 2020-08-01 ENCOUNTER — Telehealth: Payer: Self-pay | Admitting: Pharmacist

## 2020-08-01 NOTE — Progress Notes (Signed)
°  Chronic Care Management   Outreach Note  08/02/2020 Name: Andrew [name] MRN: [id number] DOB: 12-06-1940  Referred by: Susy Frizzle, MD Reason for referral : Chronic Care Management   An unsuccessful telephone outreach was attempted today. The patient was referred to the pharmacist for assistance with care management and care coordination.     Fanny Skates, [place] Pharmacist Assistant [phone]

## 2020-08-02 ENCOUNTER — Other Ambulatory Visit: Payer: Self-pay

## 2020-08-02 ENCOUNTER — Ambulatory Visit: Payer: Medicare HMO | Admitting: Pharmacist

## 2020-08-02 NOTE — Patient Instructions (Addendum)
Visit Information Thank you for meeting with me today!  I look forward to working with you to help you meet all of your healthcare goals and answer any questions you may have.  Feel free to contact me anytime!  Goals Addressed            This Visit's Progress   . Pharmacy Care Plan:       CARE PLAN ENTRY (see longitudinal plan of care for additional care plan information)  Current Barriers:  . Chronic Disease Management support, education, and care coordination needs related to Hypertension, Hyperlipidemia, and Pre-DM   Hypertension BP Readings from Last 3 Encounters:  06/18/20 130/70  06/04/20 128/64  03/15/20 126/69   . Pharmacist Clinical Goal(s): o Over the next 180 days, patient will work with PharmD and providers to maintain BP goal <130/80 . Current regimen:  o Metoprolol XL 25mg  one-half tablet daily . Interventions: o Reviewed most recent BP . Patient self care activities - Over the next 180 days, patient will: o Check BP periodically, document, and provide at future appointments o Ensure daily salt intake < 2300 mg/day  Hyperlipidemia Lab Results  Component Value Date/Time   LDLCALC 58 05/31/2020 08:48 AM   LDLCALC 55 02/08/2020 08:02 AM   . Pharmacist Clinical Goal(s): o Over the next 180 days, patient will work with PharmD and providers to maintain LDL goal < 70 . Current regimen:  o Simvastatin 10mg  . Interventions: o Discussed goals for total cholesterol < 200 and LDL < 70 o Counseled on benefits of statin medications . Patient self care activities - Over the next 180 days, patient will: o Continue to focus on medication adherence  o Notify providers of any new or worsening muscle pains  Diabetes Lab Results  Component Value Date/Time   HGBA1C 7.2 (H) 05/31/2020 08:48 AM   HGBA1C 6.6 (H) 02/08/2020 08:02 AM   . Pharmacist Clinical Goal(s): o Over the next 180 days, patient will work with PharmD and providers to achieve A1c goal <7% . Current  regimen:  o No medications . Interventions: o Reviewed most recent A1c o Discussed diabetic diet and limiting carbs to one source per meal . Patient self care activities - Over the next 180 days, patient will: o Check blood sugar at MD visit, document, and provide at future appointments o Continue to work on dietary modifications limiting carbohydrates and sweets  Initial goal documentation        Andrew [name] was given information about Chronic Care Management services today including:  1. CCM service includes personalized support from designated clinical staff supervised by his physician, including individualized plan of care and coordination with other care providers 2. 24/7 contact phone numbers for assistance for urgent and routine care needs. 3. Standard insurance, coinsurance, copays and deductibles apply for chronic care management only during months in which we provide at least 20 minutes of these services. Most insurances cover these services at 100%, however patients may be responsible for any copay, coinsurance and/or deductible if applicable. This service may help you avoid the need for more expensive face-to-face services. 4. Only one practitioner may furnish and bill the service in a calendar month. 5. The patient may stop CCM services at any time (effective at the end of the month) by phone call to the office staff.  Patient agreed to services and verbal consent obtained.   The patient verbalized understanding of instructions provided today and agreed to receive a mailed copy of patient instruction and/or  Scientist, clinical (histocompatibility and immunogenetics). Telephone follow up appointment with pharmacy team member scheduled for: 6 months  Beverly Milch, PharmD Clinical Pharmacist [place] [phone]   Carbohydrate Counting for Diabetes Mellitus, Adult  Carbohydrate counting is a method of keeping track of how many carbohydrates you eat. Eating carbohydrates naturally  increases the amount of sugar (glucose) in the blood. Counting how many carbohydrates you eat helps keep your blood glucose within normal limits, which helps you manage your diabetes (diabetes mellitus). It is important to know how many carbohydrates you can safely have in each meal. This is different for every person. A diet and nutrition specialist (registered dietitian) can help you make a meal plan and calculate how many carbohydrates you should have at each meal and snack. Carbohydrates are found in the following foods:  Grains, such as breads and cereals.  Dried beans and soy products.  Starchy vegetables, such as potatoes, peas, and corn.  Fruit and fruit juices.  Milk and yogurt.  Sweets and snack foods, such as cake, cookies, candy, chips, and soft drinks. How do I count carbohydrates? There are two ways to count carbohydrates in food. You can use either of the methods or a combination of both. Reading "Nutrition Facts" on packaged food The "Nutrition Facts" list is included on the labels of almost all packaged foods and beverages in the [place] It includes:  The serving size.  Information about nutrients in each serving, including the grams (g) of carbohydrate per serving. To use the "Nutrition Facts":  Decide how many servings you will have.  Multiply the number of servings by the number of carbohydrates per serving.  The resulting number is the total amount of carbohydrates that you will be having. Learning standard serving sizes of other foods When you eat carbohydrate foods that are not packaged or do not include "Nutrition Facts" on the label, you need to measure the servings in order to count the amount of carbohydrates:  Measure the foods that you will eat with a food scale or measuring cup, if needed.  Decide how many standard-size servings you will eat.  Multiply the number of servings by 15. Most carbohydrate-rich foods have about 15 g of carbohydrates per  serving. ? For example, if you eat 8 oz (170 g) of strawberries, you will have eaten 2 servings and 30 g of carbohydrates (2 servings x 15 g = 30 g).  For foods that have more than one food mixed, such as soups and casseroles, you must count the carbohydrates in each food that is included. The following list contains standard serving sizes of common carbohydrate-rich foods. Each of these servings has about 15 g of carbohydrates:   hamburger bun or  English muffin.   oz (15 mL) syrup.   oz (14 g) jelly.  1 slice of bread.  1 six-inch tortilla.  3 oz (85 g) cooked rice or pasta.  4 oz (113 g) cooked dried beans.  4 oz (113 g) starchy vegetable, such as peas, corn, or potatoes.  4 oz (113 g) hot cereal.  4 oz (113 g) mashed potatoes or  of a large baked potato.  4 oz (113 g) canned or frozen fruit.  4 oz (120 mL) fruit juice.  4-6 crackers.  6 chicken nuggets.  6 oz (170 g) unsweetened dry cereal.  6 oz (170 g) plain fat-free yogurt or yogurt sweetened with artificial sweeteners.  8 oz (240 mL) milk.  8 oz (170 g) fresh fruit or one  small piece of fruit.  24 oz (680 g) popped popcorn. Example of carbohydrate counting Sample meal  3 oz (85 g) chicken breast.  6 oz (170 g) brown rice.  4 oz (113 g) corn.  8 oz (240 mL) milk.  8 oz (170 g) strawberries with sugar-free whipped topping. Carbohydrate calculation 1. Identify the foods that contain carbohydrates: ? Rice. ? Corn. ? Milk. ? Strawberries. 2. Calculate how many servings you have of each food: ? 2 servings rice. ? 1 serving corn. ? 1 serving milk. ? 1 serving strawberries. 3. Multiply each number of servings by 15 g: ? 2 servings rice x 15 g = 30 g. ? 1 serving corn x 15 g = 15 g. ? 1 serving milk x 15 g = 15 g. ? 1 serving strawberries x 15 g = 15 g. 4. Add together all of the amounts to find the total grams of carbohydrates eaten: ? 30 g + 15 g + 15 g + 15 g = 75 g of carbohydrates  total. Summary  Carbohydrate counting is a method of keeping track of how many carbohydrates you eat.  Eating carbohydrates naturally increases the amount of sugar (glucose) in the blood.  Counting how many carbohydrates you eat helps keep your blood glucose within normal limits, which helps you manage your diabetes.  A diet and nutrition specialist (registered dietitian) can help you make a meal plan and calculate how many carbohydrates you should have at each meal and snack. This information is not intended to replace advice given to you by your health care provider. Make sure you discuss any questions you have with your health care provider. Document Revised: 04/22/2017 Document Reviewed: 03/11/2016 Elsevier Patient Education  [place].

## 2020-08-09 ENCOUNTER — Ambulatory Visit: Payer: Self-pay | Admitting: Pharmacist

## 2020-08-09 NOTE — Chronic Care Management (AMB) (Addendum)
  Chronic Care Management   Follow Up Note   08/09/2020 Name: Andrew [name] MRN: [id number] DOB: [birth year]  Referred by: Susy Frizzle, MD Reason for referral : No chief complaint on file.   Andrew [name] is a 79 y.o. year old male who is a primary care patient of Pickard, Cammie Mcgee, MD. The CCM team was consulted for assistance with chronic disease management and care coordination needs.    Review of patient status, including review of consultants reports, relevant laboratory and other test results, and collaboration with appropriate care team members and the patient's provider was performed as part of comprehensive patient evaluation and provision of chronic care management services.    SDOH (Social Determinants of Health) assessments performed: No See Care Plan activities for detailed interventions related to [facility])      Outpatient Encounter Medications as of 08/09/2020  Medication Sig   aspirin 81 MG tablet Take 81 mg by mouth daily.     Cholecalciferol (VITAMIN D) 2000 units CAPS Take 1 capsule by mouth daily.   finasteride (PROSCAR) 5 MG tablet TAKE 1 TABLET EVERY DAY   lansoprazole (PREVACID) 30 MG capsule Take 1 capsule (30 mg total) by mouth daily.   metoprolol succinate (TOPROL-XL) 25 MG 24 hr tablet Take 0.5 tablets (12.5 mg total) by mouth daily.   Multiple Vitamin (MULTIVITAMIN) tablet Take 1 tablet by mouth daily.     nitroGLYCERIN (NITROSTAT) 0.4 MG SL tablet Place 1 tablet (0.4 mg total) under the tongue every 5 (five) minutes as needed.   Omega-3 Fatty Acids (FISH OIL) 1000 MG CPDR Take 1,000 mg by mouth every morning.    simvastatin (ZOCOR) 10 MG tablet TAKE 1 TABLET(10 MG) BY MOUTH AT BEDTIME   tamsulosin (FLOMAX) 0.4 MG CAPS capsule TAKE 1 CAPSULE (0.4 MG TOTAL) BY MOUTH DAILY.   Facility-Administered Encounter Medications as of 08/09/2020  Medication   0.9 %  sodium chloride infusion     Objective:  Analyzed medicare care gaps and adherence data for STAR  measures.  Goals Addressed   None   Care gaps (1) - AWV not performed  Adherence Data  Cholesterol - Simvastatin 10mg  100% compliance - continue current refill strategy.   Last filled 06/15/2020 90ds due for refill on 09/14/2020.  Plan:   The patient has been provided with contact information for the care management team and has been advised to call with any health related questions or concerns.    Beverly Milch, PharmD Clinical Pharmacist [place] [phone]   I have collaborated with the care management provider regarding care management and care coordination activities outlined in this encounter and have reviewed this encounter including documentation in the note and care plan. I am certifying that I agree with the content of this note and encounter as supervising physician.

## 2020-08-13 ENCOUNTER — Other Ambulatory Visit: Payer: Self-pay

## 2020-08-13 ENCOUNTER — Ambulatory Visit (INDEPENDENT_AMBULATORY_CARE_PROVIDER_SITE_OTHER): Payer: Medicare HMO | Admitting: Family Medicine

## 2020-08-13 ENCOUNTER — Other Ambulatory Visit: Payer: Self-pay | Admitting: *Deleted

## 2020-08-13 VITALS — BP 128/60 | HR 91 | Temp 97.9°F | Ht 69.0 in | Wt 160.0 lb

## 2020-08-13 DIAGNOSIS — J309 Allergic rhinitis, unspecified: Secondary | ICD-10-CM | POA: Diagnosis not present

## 2020-08-13 DIAGNOSIS — I10 Essential (primary) hypertension: Secondary | ICD-10-CM

## 2020-08-13 DIAGNOSIS — E7800 Pure hypercholesterolemia, unspecified: Secondary | ICD-10-CM

## 2020-08-13 DIAGNOSIS — E118 Type 2 diabetes mellitus with unspecified complications: Secondary | ICD-10-CM

## 2020-08-13 DIAGNOSIS — I2510 Atherosclerotic heart disease of native coronary artery without angina pectoris: Secondary | ICD-10-CM

## 2020-08-13 DIAGNOSIS — D696 Thrombocytopenia, unspecified: Secondary | ICD-10-CM

## 2020-08-13 DIAGNOSIS — K580 Irritable bowel syndrome with diarrhea: Secondary | ICD-10-CM

## 2020-08-13 NOTE — Progress Notes (Signed)
Subjective:    Patient ID: Andrew [name], male    DOB: March 22, 1941, 79 y.o.   MRN: [id number]  HPI Patient states that for 5 hours on Saturday, he was using a chainsaw to clear a fallen tree out of his yard.  The tree was covered and dead leaves and honeysuckle vines and foliage.  He was breathing and sawdust and sap and dust.  He was not wearing a mask.  Sunday he developed pain and pressure in his frontal sinuses bilaterally with rhinorrhea and head congestion.  He may have had a low-grade temperature Sunday night.  The temperature has subsided.  He has had no further fever.  He continues to have sinus pressure which may be slightly better today, postnasal drip, rhinorrhea, and head congestion.  He denies any chest pain shortness of breath pleurisy or cough.  He has had both doses of the Covid vaccine.  He denies any pleurisy or change in his sense of smell or taste. Past Medical History:  Diagnosis Date  . Allergy    grass etc.  . Barrett's esophagus   . BPH (benign prostatic hyperplasia)   . Chest pain, atypical   . Chronic kidney disease    kidney stones  . Coronary artery disease   . GERD (gastroesophageal reflux disease)    Barrett's  . Hernia   . Hyperlipidemia   . Hypertension   . Myocardial infarction ([place])   . Thrombocytopenia ([place])   . Ulcer 1960   Past Surgical History:  Procedure Laterality Date  . CARDIAC CATHETERIZATION  02/14/2007   MITRAL REGURGITATION. LV NORMAL  . CHOLECYSTECTOMY    . COLONOSCOPY    . CORONARY ARTERY BYPASS GRAFT     twice  2 by passes each time  . INGUINAL HERNIA REPAIR     bilateral done twice  . LITHOTRIPSY    . UPPER GASTROINTESTINAL ENDOSCOPY     Current Outpatient Medications on File Prior to Visit  Medication Sig Dispense Refill  . aspirin 81 MG tablet Take 81 mg by mouth daily.      . Cholecalciferol (VITAMIN D) 2000 units CAPS Take 1 capsule by mouth daily.    . finasteride (PROSCAR) 5 MG tablet TAKE 1 TABLET EVERY DAY 90 tablet 3   . lansoprazole (PREVACID) 30 MG capsule Take 1 capsule (30 mg total) by mouth daily. 90 capsule 3  . metoprolol succinate (TOPROL-XL) 25 MG 24 hr tablet Take 0.5 tablets (12.5 mg total) by mouth daily. 45 tablet 3  . Multiple Vitamin (MULTIVITAMIN) tablet Take 1 tablet by mouth daily.      . nitroGLYCERIN (NITROSTAT) 0.4 MG SL tablet Place 1 tablet (0.4 mg total) under the tongue every 5 (five) minutes as needed. 75 tablet 3  . Omega-3 Fatty Acids (FISH OIL) 1000 MG CPDR Take 1,000 mg by mouth every morning.     . simvastatin (ZOCOR) 10 MG tablet TAKE 1 TABLET(10 MG) BY MOUTH AT BEDTIME 90 tablet 3  . tamsulosin (FLOMAX) 0.4 MG CAPS capsule TAKE 1 CAPSULE (0.4 MG TOTAL) BY MOUTH DAILY. 90 capsule 1   Current Facility-Administered Medications on File Prior to Visit  Medication Dose Route Frequency Provider Last Rate Last Admin  . 0.9 %  sodium chloride infusion  500 mL Intravenous Once Armbruster, Carlota Raspberry, MD       Allergies  Allergen Reactions  . Imdur [Isosorbide Mononitrate]     Unknown, per pt   . Meloxicam Swelling    Facial swelling  Social History   Socioeconomic History  . Marital status: Married    Spouse name: Not on file  . Number of children: 2  . Years of education: Not on file  . Highest education level: Not on file  Occupational History  . Occupation: Retired  Tobacco Use  . Smoking status: Never Smoker  . Smokeless tobacco: Never Used  Vaping Use  . Vaping Use: Never used  Substance and Sexual Activity  . Alcohol use: No  . Drug use: No  . Sexual activity: Not Currently  Other Topics Concern  . Not on file  Social History Narrative  . Not on file   Social Determinants of Health   Financial Resource Strain:   . Difficulty of Paying Living Expenses: Not on file  Food Insecurity:   . Worried About Charity fundraiser in the Last Year: Not on file  . Ran Out of Food in the Last Year: Not on file  Transportation Needs:   . Lack of Transportation  (Medical): Not on file  . Lack of Transportation (Non-Medical): Not on file  Physical Activity:   . Days of Exercise per Week: Not on file  . Minutes of Exercise per Session: Not on file  Stress:   . Feeling of Stress : Not on file  Social Connections:   . Frequency of Communication with Friends and Family: Not on file  . Frequency of Social Gatherings with Friends and Family: Not on file  . Attends Religious Services: Not on file  . Active Member of Clubs or Organizations: Not on file  . Attends Archivist Meetings: Not on file  . Marital Status: Not on file  Intimate Partner Violence:   . Fear of Current or Ex-Partner: Not on file  . Emotionally Abused: Not on file  . Physically Abused: Not on file  . Sexually Abused: Not on file      Review of Systems  All other systems reviewed and are negative.      Objective:   Physical Exam Vitals reviewed.  Constitutional:      Appearance: Normal appearance. He is normal weight.  HENT:     Nose: Mucosal edema, congestion and rhinorrhea present.     Right Turbinates: Swollen and pale.     Left Turbinates: Swollen and pale.     Right Sinus: No maxillary sinus tenderness or frontal sinus tenderness.     Left Sinus: No maxillary sinus tenderness or frontal sinus tenderness.  Cardiovascular:     Rate and Rhythm: Normal rate and regular rhythm.     Heart sounds: Normal heart sounds. No murmur heard.  No friction rub. No gallop.   Pulmonary:     Effort: Pulmonary effort is normal. No respiratory distress.     Breath sounds: Normal breath sounds. No stridor. No wheezing, rhonchi or rales.  Chest:     Chest wall: No tenderness.  Abdominal:     General: Bowel sounds are normal.     Palpations: Abdomen is soft.  Musculoskeletal:     Right lower leg: No edema.     Left lower leg: No edema.  Neurological:     Mental Status: He is alert.           Assessment & Plan:  Allergic sinusitis  I do not believe that this  is a sinus infection is much as I believe this is likely allergies that were triggered by clearing the wooden debris from his land.  I recommended  using Xyzal and Flonase over the next few days and monitoring to see if his symptoms improve or worsen.  At the present time I see no indication for antibiotics.  I also do not believe that this is Covid based on his current symptoms.

## 2020-08-14 DIAGNOSIS — R351 Nocturia: Secondary | ICD-10-CM | POA: Diagnosis not present

## 2020-08-14 DIAGNOSIS — N403 Nodular prostate with lower urinary tract symptoms: Secondary | ICD-10-CM | POA: Diagnosis not present

## 2020-08-14 DIAGNOSIS — E291 Testicular hypofunction: Secondary | ICD-10-CM | POA: Diagnosis not present

## 2020-08-14 DIAGNOSIS — N401 Enlarged prostate with lower urinary tract symptoms: Secondary | ICD-10-CM | POA: Diagnosis not present

## 2020-08-27 ENCOUNTER — Other Ambulatory Visit: Payer: Self-pay

## 2020-08-27 ENCOUNTER — Ambulatory Visit (INDEPENDENT_AMBULATORY_CARE_PROVIDER_SITE_OTHER): Payer: Medicare HMO | Admitting: Family Medicine

## 2020-08-27 VITALS — BP 140/70 | HR 82 | Temp 97.3°F | Ht 69.0 in | Wt 158.0 lb

## 2020-08-27 DIAGNOSIS — Z0001 Encounter for general adult medical examination with abnormal findings: Secondary | ICD-10-CM | POA: Diagnosis not present

## 2020-08-27 DIAGNOSIS — E118 Type 2 diabetes mellitus with unspecified complications: Secondary | ICD-10-CM

## 2020-08-27 DIAGNOSIS — I2510 Atherosclerotic heart disease of native coronary artery without angina pectoris: Secondary | ICD-10-CM | POA: Diagnosis not present

## 2020-08-27 DIAGNOSIS — I10 Essential (primary) hypertension: Secondary | ICD-10-CM | POA: Diagnosis not present

## 2020-08-27 DIAGNOSIS — E7800 Pure hypercholesterolemia, unspecified: Secondary | ICD-10-CM

## 2020-08-27 NOTE — Progress Notes (Signed)
Subjective:    Patient ID: Andrew [name], male    DOB: 11-28-1940, 79 y.o.   MRN: [id number]  HPI Patient is a very pleasant 79 year old Caucasian male here today for complete physical exam.  I reviewed his shot records.  All of his shots are up-to-date except he is due for a booster on his Covid vaccine.  We discussed this at length and I have encouraged him to get the booster at the present time.  He is also due for Shingrix however I recommended that he delay that until after he has had his Covid booster.  Due to his age I would not recommend colon cancer screening or prostate cancer screening.  Otherwise he is doing well with no concerns.  Physical exam today is significant for a large hemangioma on his right temple.  He states that his dermatologist saw that and was not concerned.  Is roughly 1.2 cm in diameter.  It does not appear cancerous.  Otherwise his exam today is unremarkable aside from the left cerumen impaction of the he request that we remove.  He denies any falls, depression, or memory loss Past Medical History:  Diagnosis Date  . Allergy    grass etc.  . Barrett's esophagus   . BPH (benign prostatic hyperplasia)   . Chest pain, atypical   . Chronic kidney disease    kidney stones  . Coronary artery disease   . GERD (gastroesophageal reflux disease)    Barrett's  . Hernia   . Hyperlipidemia   . Hypertension   . Myocardial infarction ([place])   . Thrombocytopenia ([place])   . Ulcer 1960   Past Surgical History:  Procedure Laterality Date  . CARDIAC CATHETERIZATION  02/14/2007   MITRAL REGURGITATION. LV NORMAL  . CHOLECYSTECTOMY    . COLONOSCOPY    . CORONARY ARTERY BYPASS GRAFT     twice  2 by passes each time  . INGUINAL HERNIA REPAIR     bilateral done twice  . LITHOTRIPSY    . UPPER GASTROINTESTINAL ENDOSCOPY     Current Outpatient Medications on File Prior to Visit  Medication Sig Dispense Refill  . aspirin 81 MG tablet Take 81 mg by mouth daily.      .  Cholecalciferol (VITAMIN D) 2000 units CAPS Take 1 capsule by mouth daily.    . finasteride (PROSCAR) 5 MG tablet TAKE 1 TABLET EVERY DAY 90 tablet 3  . lansoprazole (PREVACID) 30 MG capsule Take 1 capsule (30 mg total) by mouth daily. 90 capsule 3  . metoprolol succinate (TOPROL-XL) 25 MG 24 hr tablet Take 0.5 tablets (12.5 mg total) by mouth daily. 45 tablet 3  . Multiple Vitamin (MULTIVITAMIN) tablet Take 1 tablet by mouth daily.      . nitroGLYCERIN (NITROSTAT) 0.4 MG SL tablet Place 1 tablet (0.4 mg total) under the tongue every 5 (five) minutes as needed. 75 tablet 3  . Omega-3 Fatty Acids (FISH OIL) 1000 MG CPDR Take 1,000 mg by mouth every morning.     . simvastatin (ZOCOR) 10 MG tablet TAKE 1 TABLET(10 MG) BY MOUTH AT BEDTIME 90 tablet 3  . tamsulosin (FLOMAX) 0.4 MG CAPS capsule TAKE 1 CAPSULE (0.4 MG TOTAL) BY MOUTH DAILY. 90 capsule 1   Current Facility-Administered Medications on File Prior to Visit  Medication Dose Route Frequency Provider Last Rate Last Admin  . 0.9 %  sodium chloride infusion  500 mL Intravenous Once Armbruster, Carlota Raspberry, MD  Allergies  Allergen Reactions  . Imdur [Isosorbide Mononitrate]     Unknown, per pt   . Meloxicam Swelling    Facial swelling    Social History   Socioeconomic History  . Marital status: Married    Spouse name: Not on file  . Number of children: 2  . Years of education: Not on file  . Highest education level: Not on file  Occupational History  . Occupation: Retired  Tobacco Use  . Smoking status: Never Smoker  . Smokeless tobacco: Never Used  Vaping Use  . Vaping Use: Never used  Substance and Sexual Activity  . Alcohol use: No  . Drug use: No  . Sexual activity: Not Currently  Other Topics Concern  . Not on file  Social History Narrative  . Not on file   Social Determinants of Health   Financial Resource Strain:   . Difficulty of Paying Living Expenses: Not on file  Food Insecurity:   . Worried About  Charity fundraiser in the Last Year: Not on file  . Ran Out of Food in the Last Year: Not on file  Transportation Needs:   . Lack of Transportation (Medical): Not on file  . Lack of Transportation (Non-Medical): Not on file  Physical Activity:   . Days of Exercise per Week: Not on file  . Minutes of Exercise per Session: Not on file  Stress:   . Feeling of Stress : Not on file  Social Connections:   . Frequency of Communication with Friends and Family: Not on file  . Frequency of Social Gatherings with Friends and Family: Not on file  . Attends Religious Services: Not on file  . Active Member of Clubs or Organizations: Not on file  . Attends Archivist Meetings: Not on file  . Marital Status: Not on file  Intimate Partner Violence:   . Fear of Current or Ex-Partner: Not on file  . Emotionally Abused: Not on file  . Physically Abused: Not on file  . Sexually Abused: Not on file      Review of Systems  All other systems reviewed and are negative.      Objective:   Physical Exam Vitals reviewed.  Constitutional:      General: He is not in acute distress.    Appearance: Normal appearance. He is normal weight. He is not ill-appearing or toxic-appearing.  HENT:     Head: Normocephalic and atraumatic.     Right Ear: Tympanic membrane, ear canal and external ear normal. There is no impacted cerumen.     Left Ear: Tympanic membrane, ear canal and external ear normal. There is no impacted cerumen.     Nose: Nose normal. No congestion or rhinorrhea.     Mouth/Throat:     Mouth: Mucous membranes are moist.     Pharynx: Oropharynx is clear. No oropharyngeal exudate or posterior oropharyngeal erythema.  Eyes:     Extraocular Movements: Extraocular movements intact.     Conjunctiva/sclera: Conjunctivae normal.     Pupils: Pupils are equal, round, and reactive to light.  Neck:     Vascular: No carotid bruit.  Cardiovascular:     Rate and Rhythm: Normal rate and regular  rhythm.     Heart sounds: Normal heart sounds. No murmur heard.  No friction rub. No gallop.   Pulmonary:     Effort: Pulmonary effort is normal. No respiratory distress.     Breath sounds: Normal breath sounds. No stridor.  No wheezing, rhonchi or rales.  Chest:     Chest wall: No tenderness.  Abdominal:     General: Bowel sounds are normal. There is no distension.     Palpations: Abdomen is soft. There is no mass.     Tenderness: There is no abdominal tenderness. There is no guarding or rebound.     Hernia: No hernia is present.  Musculoskeletal:     Cervical back: Normal range of motion and neck supple. No rigidity or tenderness.     Right lower leg: No edema.     Left lower leg: No edema.  Lymphadenopathy:     Cervical: No cervical adenopathy.  Skin:    General: Skin is warm.     Coloration: Skin is not jaundiced or pale.     Findings: No bruising, erythema, lesion or rash.  Neurological:     General: No focal deficit present.     Mental Status: He is alert and oriented to person, place, and time. Mental status is at baseline.     Cranial Nerves: No cranial nerve deficit.     Sensory: No sensory deficit.     Motor: No weakness.     Coordination: Coordination normal.     Gait: Gait normal.     Deep Tendon Reflexes: Reflexes normal.  Psychiatric:        Mood and Affect: Mood normal.        Behavior: Behavior normal.        Thought Content: Thought content normal.        Judgment: Judgment normal.           Assessment & Plan:  Controlled type 2 diabetes mellitus with complication, without long-term current use of insulin (HCC) - Plan: Hemoglobin A1c, CBC with Differential/Platelet, COMPLETE METABOLIC PANEL WITH GFR, Lipid panel, Microalbumin, urine  Coronary artery disease involving native coronary artery of native heart without angina pectoris  Benign essential HTN  Pure hypercholesterolemia  ASCVD (arteriosclerotic cardiovascular disease) - Plan: Hemoglobin A1c,  CBC with Differential/Platelet, COMPLETE METABOLIC PANEL WITH GFR, Lipid panel, Microalbumin, urine  Physical exam today is significant for hemangioma on his right temple.  He also has a left cerumen impaction that we were removed with irrigation and lavage.  Blood pressure today is well controlled.  Check A1c.  Ideally I like his A1c to be less than 7.  Check fasting lipid panel.  Goal LDL cholesterol is less than 100.  Check urine microalbumin.  Patient denies any falls, depression, or memory loss.  Cancer screening is not recommended due to age.  I did strongly recommend the Covid booster.

## 2020-08-28 LAB — COMPLETE METABOLIC PANEL WITH GFR
AG Ratio: 2.3 (calc) (ref 1.0–2.5)
ALT: 15 U/L (ref 9–46)
AST: 15 U/L (ref 10–35)
Albumin: 4.2 g/dL (ref 3.6–5.1)
Alkaline phosphatase (APISO): 55 U/L (ref 35–144)
BUN: 18 mg/dL (ref 7–25)
CO2: 25 mmol/L (ref 20–32)
Calcium: 9 mg/dL (ref 8.6–10.3)
Chloride: 104 mmol/L (ref 98–110)
Creat: 0.89 mg/dL (ref 0.70–1.11)
GFR, Est African American: 93 mL/min/{1.73_m2} (ref >=60)
GFR, Est Non African American: 80 mL/min/{1.73_m2} (ref >=60)
Globulin: 1.8 g/dL (calc) — ABNORMAL LOW (ref 1.9–3.7)
Glucose, Bld: 166 mg/dL — ABNORMAL HIGH (ref 65–99)
Potassium: 4.1 mmol/L (ref 3.5–5.3)
Sodium: 140 mmol/L (ref 135–146)
Total Bilirubin: 0.6 mg/dL (ref 0.2–1.2)
Total Protein: 6 g/dL — ABNORMAL LOW (ref 6.1–8.1)

## 2020-08-28 LAB — HEMOGLOBIN A1C
Hgb A1c MFr Bld: 6.8 % of total Hgb — ABNORMAL HIGH (ref <5.7)
Mean Plasma Glucose: 148 (calc)
eAG (mmol/L): 8.2 (calc)

## 2020-08-28 LAB — CBC WITH DIFFERENTIAL/PLATELET
Absolute Monocytes: 409 cells/uL (ref 200–950)
Basophils Absolute: 49 cells/uL (ref 0–200)
Basophils Relative: 0.8 %
Eosinophils Absolute: 61 cells/uL (ref 15–500)
Eosinophils Relative: 1 %
HCT: 41.6 % (ref 38.5–50.0)
Hemoglobin: 14.2 g/dL (ref 13.2–17.1)
Lymphs Abs: 1165 cells/uL (ref 850–3900)
MCH: 30.7 pg (ref 27.0–33.0)
MCHC: 34.1 g/dL (ref 32.0–36.0)
MCV: 90 fL (ref 80.0–100.0)
MPV: 10.9 fL (ref 7.5–12.5)
Monocytes Relative: 6.7 %
Neutro Abs: 4416 cells/uL (ref 1500–7800)
Neutrophils Relative %: 72.4 %
Platelets: 86 10*3/uL — ABNORMAL LOW (ref 140–400)
RBC: 4.62 10*6/uL (ref 4.20–5.80)
RDW: 11.9 % (ref 11.0–15.0)
Total Lymphocyte: 19.1 %
WBC: 6.1 10*3/uL (ref 3.8–10.8)

## 2020-08-28 LAB — LIPID PANEL
Cholesterol: 109 mg/dL (ref <200)
HDL: 42 mg/dL (ref >=40)
LDL Cholesterol (Calc): 53 mg/dL (calc)
Non-HDL Cholesterol (Calc): 67 mg/dL (calc) (ref <130)
Total CHOL/HDL Ratio: 2.6 (calc) (ref <5.0)
Triglycerides: 62 mg/dL (ref <150)

## 2020-08-28 LAB — MICROALBUMIN, URINE: Microalb, Ur: 0.9 mg/dL

## 2020-09-10 ENCOUNTER — Inpatient Hospital Stay (HOSPITAL_COMMUNITY): Payer: Medicare HMO | Attending: Hematology

## 2020-09-10 ENCOUNTER — Other Ambulatory Visit: Payer: Self-pay

## 2020-09-10 DIAGNOSIS — D696 Thrombocytopenia, unspecified: Secondary | ICD-10-CM | POA: Insufficient documentation

## 2020-09-10 LAB — CBC WITH DIFFERENTIAL/PLATELET
Abs Immature Granulocytes: 0.03 10*3/uL (ref 0.00–0.07)
Basophils Absolute: 0.1 10*3/uL (ref 0.0–0.1)
Basophils Relative: 1 %
Eosinophils Absolute: 0 10*3/uL (ref 0.0–0.5)
Eosinophils Relative: 1 %
HCT: 41.6 % (ref 39.0–52.0)
Hemoglobin: 14.2 g/dL (ref 13.0–17.0)
Immature Granulocytes: 0 %
Lymphocytes Relative: 17 %
Lymphs Abs: 1.4 10*3/uL (ref 0.7–4.0)
MCH: 30.7 pg (ref 26.0–34.0)
MCHC: 34.1 g/dL (ref 30.0–36.0)
MCV: 89.8 fL (ref 80.0–100.0)
Monocytes Absolute: 0.5 10*3/uL (ref 0.1–1.0)
Monocytes Relative: 7 %
Neutro Abs: 5.8 10*3/uL (ref 1.7–7.7)
Neutrophils Relative %: 74 %
Platelets: 155 10*3/uL (ref 150–400)
RBC: 4.63 MIL/uL (ref 4.22–5.81)
RDW: 12.4 % (ref 11.5–15.5)
WBC: 7.8 10*3/uL (ref 4.0–10.5)
nRBC: 0 % (ref 0.0–0.2)

## 2020-09-17 ENCOUNTER — Encounter (HOSPITAL_COMMUNITY): Payer: Self-pay | Admitting: Oncology

## 2020-09-17 ENCOUNTER — Ambulatory Visit (HOSPITAL_COMMUNITY): Payer: Medicare HMO | Admitting: Hematology

## 2020-09-17 ENCOUNTER — Other Ambulatory Visit: Payer: Self-pay

## 2020-09-17 ENCOUNTER — Inpatient Hospital Stay (HOSPITAL_COMMUNITY): Payer: Medicare HMO | Attending: Hematology | Admitting: Oncology

## 2020-09-17 VITALS — BP 134/60 | HR 75 | Temp 97.2°F | Resp 16 | Wt 162.5 lb

## 2020-09-17 DIAGNOSIS — I129 Hypertensive chronic kidney disease with stage 1 through stage 4 chronic kidney disease, or unspecified chronic kidney disease: Secondary | ICD-10-CM | POA: Insufficient documentation

## 2020-09-17 DIAGNOSIS — R599 Enlarged lymph nodes, unspecified: Secondary | ICD-10-CM | POA: Diagnosis not present

## 2020-09-17 DIAGNOSIS — D696 Thrombocytopenia, unspecified: Secondary | ICD-10-CM | POA: Diagnosis not present

## 2020-09-17 DIAGNOSIS — I252 Old myocardial infarction: Secondary | ICD-10-CM | POA: Insufficient documentation

## 2020-09-17 DIAGNOSIS — Z7982 Long term (current) use of aspirin: Secondary | ICD-10-CM | POA: Diagnosis not present

## 2020-09-17 DIAGNOSIS — Z79899 Other long term (current) drug therapy: Secondary | ICD-10-CM | POA: Diagnosis not present

## 2020-09-17 DIAGNOSIS — I2510 Atherosclerotic heart disease of native coronary artery without angina pectoris: Secondary | ICD-10-CM | POA: Diagnosis not present

## 2020-09-17 DIAGNOSIS — N189 Chronic kidney disease, unspecified: Secondary | ICD-10-CM | POA: Insufficient documentation

## 2020-09-17 NOTE — Progress Notes (Signed)
[place], [place] [address]   CLINIC:  Medical Oncology/Hematology  PCP:  Susy Frizzle, MD [address] [place] [address]  [phone]  REASON FOR VISIT:  Follow-up for thrombocytopenia  PRIOR THERAPY: None  CURRENT THERAPY: Under work up  INTERVAL HISTORY:  [name], a 79 y.o. male, returns for routine follow-up for his thrombocytopenia. Andrew [name] was last seen on 03/15/2020.  Today he reports feeling well overall.  Denies any easy bruising or bleeding.  Appetite and energy levels are 100%.  Andrew [name] is very active at home.  He exercises every morning and keeps himself busy.  He plans on going home this afternoon to cut firewood.  He denies any current concerns.  REVIEW OF SYSTEMS:  Review of Systems  Constitutional: Negative for appetite change and fatigue.  All other systems reviewed and are negative.   PAST MEDICAL/SURGICAL HISTORY:  Past Medical History:  Diagnosis Date  . Allergy    grass etc.  . Barrett's esophagus   . BPH (benign prostatic hyperplasia)   . Chest pain, atypical   . Chronic kidney disease    kidney stones  . Coronary artery disease   . GERD (gastroesophageal reflux disease)    Barrett's  . Hernia   . Hyperlipidemia   . Hypertension   . Myocardial infarction ([place])   . Thrombocytopenia ([place])   . Ulcer 1960   Past Surgical History:  Procedure Laterality Date  . CARDIAC CATHETERIZATION  02/14/2007   MITRAL REGURGITATION. LV NORMAL  . CHOLECYSTECTOMY    . COLONOSCOPY    . CORONARY ARTERY BYPASS GRAFT     twice  2 by passes each time  . INGUINAL HERNIA REPAIR     bilateral done twice  . LITHOTRIPSY    . UPPER GASTROINTESTINAL ENDOSCOPY      SOCIAL HISTORY:  Social History   Socioeconomic History  . Marital status: Married    Spouse name: Not on file  . Number of children: 2  . Years of education: Not on file  . Highest education level: Not on file  Occupational  History  . Occupation: Retired  Tobacco Use  . Smoking status: Never Smoker  . Smokeless tobacco: Never Used  Vaping Use  . Vaping Use: Never used  Substance and Sexual Activity  . Alcohol use: No  . Drug use: No  . Sexual activity: Not Currently  Other Topics Concern  . Not on file  Social History Narrative  . Not on file   Social Determinants of Health   Financial Resource Strain: Low Risk   . Difficulty of Paying Living Expenses: Not hard at all  Food Insecurity: No Food Insecurity  . Worried About Charity fundraiser in the Last Year: Never true  . Ran Out of Food in the Last Year: Never true  Transportation Needs: No Transportation Needs  . Lack of Transportation (Medical): No  . Lack of Transportation (Non-Medical): No  Physical Activity: Inactive  . Days of Exercise per Week: 0 days  . Minutes of Exercise per Session: 0 min  Stress: No Stress Concern Present  . Feeling of Stress : Not at all  Social Connections: Moderately Integrated  . Frequency of Communication with Friends and Family: More than three times a week  . Frequency of Social Gatherings with Friends and Family: Three times a week  . Attends Religious Services: 1 to 4 times per year  .  Active Member of Clubs or Organizations: No  . Attends Archivist Meetings: Never  . Marital Status: Married  Human resources officer Violence: Not At Risk  . Fear of Current or Ex-Partner: No  . Emotionally Abused: No  . Physically Abused: No  . Sexually Abused: No    FAMILY HISTORY:  Family History  Problem Relation Age of Onset  . Stroke Father   . Heart attack Mother   . Heart attack Maternal Grandmother   . Heart attack Maternal Grandfather   . Heart disease Sister   . Heart disease Brother   . Heart disease Sister   . Heart disease Brother   . Prostate cancer Brother   . Colon cancer Neg Hx   . Esophageal cancer Neg Hx   . Rectal cancer Neg Hx   . Stomach cancer Neg Hx     CURRENT MEDICATIONS:    Current Outpatient Medications  Medication Sig Dispense Refill  . aspirin 81 MG tablet Take 81 mg by mouth daily.      . Cholecalciferol (VITAMIN D) 2000 units CAPS Take 1 capsule by mouth daily.    [name] dutasteride (AVODART) 0.5 MG capsule     . finasteride (PROSCAR) 5 MG tablet TAKE 1 TABLET EVERY DAY 90 tablet 3  . lansoprazole (PREVACID) 30 MG capsule Take 1 capsule (30 mg total) by mouth daily. 90 capsule 3  . metoprolol succinate (TOPROL-XL) 25 MG 24 hr tablet Take 0.5 tablets (12.5 mg total) by mouth daily. 45 tablet 3  . Multiple Vitamin (MULTIVITAMIN) tablet Take 1 tablet by mouth daily.      . nitroGLYCERIN (NITROSTAT) 0.4 MG SL tablet Place 1 tablet (0.4 mg total) under the tongue every 5 (five) minutes as needed. 75 tablet 3  . Omega-3 Fatty Acids (FISH OIL) 1000 MG CPDR Take 1,000 mg by mouth every morning.     . simvastatin (ZOCOR) 10 MG tablet TAKE 1 TABLET(10 MG) BY MOUTH AT BEDTIME 90 tablet 3  . tamsulosin (FLOMAX) 0.4 MG CAPS capsule TAKE 1 CAPSULE (0.4 MG TOTAL) BY MOUTH DAILY. 90 capsule 1   Current Facility-Administered Medications  Medication Dose Route Frequency Provider Last Rate Last Admin  . 0.9 %  sodium chloride infusion  500 mL Intravenous Once Armbruster, Carlota Raspberry, MD        ALLERGIES:  Allergies  Allergen Reactions  . Imdur [Isosorbide Mononitrate]     Unknown, per pt   . Meloxicam Swelling    Facial swelling     PHYSICAL EXAM:  Performance status (ECOG): 0 - Asymptomatic  Vitals:   09/17/20 1406  BP: 134/60  Pulse: 75  Resp: 16  Temp: (!) 97.2 F (36.2 C)  SpO2: 99%   Wt Readings from Last 3 Encounters:  09/17/20 162 lb 7.7 oz (73.7 kg)  08/27/20 158 lb (71.7 kg)  08/13/20 160 lb (72.6 kg)   Physical Exam Vitals reviewed.  Constitutional:      Appearance: Normal appearance.  Neurological:     General: No focal deficit present.     Mental Status: He is alert and oriented to person, place, and time.  Psychiatric:        Mood and  Affect: Mood normal.        Behavior: Behavior normal.     LABORATORY DATA:  I have reviewed the labs as listed.  CBC Latest Ref Rng & Units 09/10/2020 08/27/2020 05/31/2020  WBC 4.0 - 10.5 K/uL 7.8 6.1 7.4  Hemoglobin 13.0 - 17.0  g/dL 14.2 14.2 14.4  Hematocrit 39 - 52 % 41.6 41.6 41.9  Platelets 150 - 400 K/uL 155 86(L) CANCELED   CMP Latest Ref Rng & Units 08/27/2020 05/31/2020 02/29/2020  Glucose 65 - 99 mg/dL 166(H) 165(H) 158(H)  BUN 7 - 25 mg/dL 18 22 26(H)  Creatinine 0.70 - 1.11 mg/dL 0.89 0.86 0.89  Sodium 135 - 146 mmol/L 140 137 139  Potassium 3.5 - 5.3 mmol/L 4.1 4.1 4.2  Chloride 98 - 110 mmol/L 104 101 106  CO2 20 - 32 mmol/L 25 23 26   Calcium 8.6 - 10.3 mg/dL 9.0 9.1 8.6(L)  Total Protein 6.1 - 8.1 g/dL 6.0(L) 6.1 6.4(L)  Total Bilirubin 0.2 - 1.2 mg/dL 0.6 0.5 0.7  Alkaline Phos 48 - 121 IU/L - 71 58  AST 10 - 35 U/L 15 14 18   ALT 9 - 46 U/L 15 11 17       Component Value Date/Time   RBC 4.63 09/10/2020 1434   MCV 89.8 09/10/2020 1434   MCV 87 05/31/2020 0848   MCH 30.7 09/10/2020 1434   MCHC 34.1 09/10/2020 1434   RDW 12.4 09/10/2020 1434   RDW 12.4 05/31/2020 0848   LYMPHSABS 1.4 09/10/2020 1434   MONOABS 0.5 09/10/2020 1434   EOSABS 0.0 09/10/2020 1434   BASOSABS 0.1 09/10/2020 1434    DIAGNOSTIC IMAGING:  I have independently reviewed the scans and discussed with the patient.   ASSESSMENT:  1.  Mild to moderate thrombocytopenia: -Patient evaluated for moderate thrombocytopenia.  CBC on 02/08/2020 showed platelet count 72 with normal white count and hemoglobin. -He had platelet count low since 2011, mostly in the 120 range. -CTAP on 07/12/2019 shows normal spleen with no hepatic masses.  Normal enlarged lymph nodes. -Reviewed labs from 02/29/2020 which showed normal platelet count of 158.  Nutritional deficiency work-up and connective tissue disorder work-up was negative.   PLAN:  1.  Mild to moderate thrombocytopenia: -We reviewed labs from  09/10/2020.  Platelet count is 155,000.  Oddly enough lab work from 08/27/2020 shows a platelet count of 86,000. -He denies any changes to his medications including taking steroids. -Patient to return to clinic in approximately 6 months with labs. -Return to clinic sooner if he develops any easy bruising or bleeding.  Disposition: -RTC in 6 months with repeat lab work and MD assessment  Greater than 50% was spent in counseling and coordination of care with this patient including but not limited to discussion of the relevant topics above (See A&P) including, but not limited to diagnosis and management of acute and chronic medical conditions.    Orders placed this encounter:  No orders of the defined types were placed in this encounter.  Faythe Casa, NP 09/17/2020 2:35 PM  [place] [phone]

## 2020-09-27 ENCOUNTER — Other Ambulatory Visit: Payer: Self-pay

## 2020-09-27 ENCOUNTER — Ambulatory Visit (INDEPENDENT_AMBULATORY_CARE_PROVIDER_SITE_OTHER): Payer: Medicare HMO | Admitting: Family Medicine

## 2020-09-27 VITALS — BP 130/70 | HR 73 | Temp 98.6°F | Ht 69.0 in | Wt 160.0 lb

## 2020-09-27 DIAGNOSIS — J329 Chronic sinusitis, unspecified: Secondary | ICD-10-CM | POA: Diagnosis not present

## 2020-09-27 DIAGNOSIS — J310 Chronic rhinitis: Secondary | ICD-10-CM | POA: Diagnosis not present

## 2020-09-27 MED ORDER — AMOXICILLIN 875 MG PO TABS: 875.0000 mg | ORAL_TABLET | Freq: Two times a day (BID) | ORAL | 0 refills | Status: DC

## 2020-09-27 NOTE — Progress Notes (Signed)
Subjective:    Patient ID: Andrew [name], male    DOB: 1941-03-10, 79 y.o.   MRN: [id number]  Sinus Problem  Sore Throat    Patient reports pain and pressure in his frontal sinuses for 2 to 3 days.  He reports postnasal drip and a sore scratchy throat.  He denies any fever.  He denies any cough.  He denies any chest pain.  He denies any shortness of breath.  He denies any pleurisy.  He denies any sudden change in his sense of taste or smell.  He denies any nausea or vomiting or diarrhea Past Medical History:  Diagnosis Date  . Allergy    grass etc.  . Barrett's esophagus   . BPH (benign prostatic hyperplasia)   . Chest pain, atypical   . Chronic kidney disease    kidney stones  . Coronary artery disease   . GERD (gastroesophageal reflux disease)    Barrett's  . Hernia   . Hyperlipidemia   . Hypertension   . Myocardial infarction ([place])   . Thrombocytopenia ([place])   . Ulcer 1960   Past Surgical History:  Procedure Laterality Date  . CARDIAC CATHETERIZATION  02/14/2007   MITRAL REGURGITATION. LV NORMAL  . CHOLECYSTECTOMY    . COLONOSCOPY    . CORONARY ARTERY BYPASS GRAFT     twice  2 by passes each time  . INGUINAL HERNIA REPAIR     bilateral done twice  . LITHOTRIPSY    . UPPER GASTROINTESTINAL ENDOSCOPY     Current Outpatient Medications on File Prior to Visit  Medication Sig Dispense Refill  . aspirin 81 MG tablet Take 81 mg by mouth daily.    . Cholecalciferol (VITAMIN D) 2000 units CAPS Take 1 capsule by mouth daily.    [name] dutasteride (AVODART) 0.5 MG capsule     . finasteride (PROSCAR) 5 MG tablet TAKE 1 TABLET EVERY DAY 90 tablet 3  . lansoprazole (PREVACID) 30 MG capsule Take 1 capsule (30 mg total) by mouth daily. 90 capsule 3  . metoprolol succinate (TOPROL-XL) 25 MG 24 hr tablet Take 0.5 tablets (12.5 mg total) by mouth daily. 45 tablet 3  . Multiple Vitamin (MULTIVITAMIN) tablet Take 1 tablet by mouth daily.    . nitroGLYCERIN (NITROSTAT) 0.4 MG SL tablet  Place 1 tablet (0.4 mg total) under the tongue every 5 (five) minutes as needed. 75 tablet 3  . Omega-3 Fatty Acids (FISH OIL) 1000 MG CPDR Take 1,000 mg by mouth every morning.    . simvastatin (ZOCOR) 10 MG tablet TAKE 1 TABLET(10 MG) BY MOUTH AT BEDTIME 90 tablet 3  . tamsulosin (FLOMAX) 0.4 MG CAPS capsule TAKE 1 CAPSULE (0.4 MG TOTAL) BY MOUTH DAILY. 90 capsule 1   Current Facility-Administered Medications on File Prior to Visit  Medication Dose Route Frequency Provider Last Rate Last Admin  . 0.9 %  sodium chloride infusion  500 mL Intravenous Once Armbruster, Carlota Raspberry, MD       Allergies  Allergen Reactions  . Imdur [Isosorbide Mononitrate]     Unknown, per pt   . Meloxicam Swelling    Facial swelling    Social History   Socioeconomic History  . Marital status: Married    Spouse name: Not on file  . Number of children: 2  . Years of education: Not on file  . Highest education level: Not on file  Occupational History  . Occupation: Retired  Tobacco Use  . Smoking status: Never Smoker  .  Smokeless tobacco: Never Used  Vaping Use  . Vaping Use: Never used  Substance and Sexual Activity  . Alcohol use: No  . Drug use: No  . Sexual activity: Not Currently  Other Topics Concern  . Not on file  Social History Narrative  . Not on file   Social Determinants of Health   Financial Resource Strain: Low Risk   . Difficulty of Paying Living Expenses: Not hard at all  Food Insecurity: No Food Insecurity  . Worried About Charity fundraiser in the Last Year: Never true  . Ran Out of Food in the Last Year: Never true  Transportation Needs: No Transportation Needs  . Lack of Transportation (Medical): No  . Lack of Transportation (Non-Medical): No  Physical Activity: Inactive  . Days of Exercise per Week: 0 days  . Minutes of Exercise per Session: 0 min  Stress: No Stress Concern Present  . Feeling of Stress : Not at all  Social Connections: Moderately Integrated  .  Frequency of Communication with Friends and Family: More than three times a week  . Frequency of Social Gatherings with Friends and Family: Three times a week  . Attends Religious Services: 1 to 4 times per year  . Active Member of Clubs or Organizations: No  . Attends Archivist Meetings: Never  . Marital Status: Married  Human resources officer Violence: Not At Risk  . Fear of Current or Ex-Partner: No  . Emotionally Abused: No  . Physically Abused: No  . Sexually Abused: No      Review of Systems  All other systems reviewed and are negative.      Objective:   Physical Exam Vitals reviewed.  Constitutional:      Appearance: Normal appearance. He is normal weight.  HENT:     Nose: Mucosal edema, congestion and rhinorrhea present.     Right Turbinates: Swollen and pale.     Left Turbinates: Swollen and pale.     Right Sinus: No maxillary sinus tenderness or frontal sinus tenderness.     Left Sinus: No maxillary sinus tenderness or frontal sinus tenderness.  Cardiovascular:     Rate and Rhythm: Normal rate and regular rhythm.     Heart sounds: Normal heart sounds. No murmur heard. No friction rub. No gallop.   Pulmonary:     Effort: Pulmonary effort is normal. No respiratory distress.     Breath sounds: Normal breath sounds. No stridor. No wheezing, rhonchi or rales.  Chest:     Chest wall: No tenderness.  Abdominal:     General: Bowel sounds are normal.     Palpations: Abdomen is soft.  Musculoskeletal:     Right lower leg: No edema.     Left lower leg: No edema.  Neurological:     Mental Status: He is alert.           Assessment & Plan:  Rhinosinusitis  I believe the patient likely has a viral rhinosinusitis.  I recommended symptomatic care only including Zyrtec 10 mg a day for drainage and sinus inflammation as well as Flonase 2 sprays each nostril daily.  Allow tincture of time.  I anticipate that symptoms improve over the next 3 to 4 days  spontaneously.  If he develops high fever or purulent sinus drainage at that point he can start amoxicillin 875 mg twice daily but I do not feel that he needs it at this time.

## 2020-10-07 ENCOUNTER — Ambulatory Visit (INDEPENDENT_AMBULATORY_CARE_PROVIDER_SITE_OTHER): Payer: Medicare HMO | Admitting: Family Medicine

## 2020-10-07 ENCOUNTER — Other Ambulatory Visit: Payer: Self-pay

## 2020-10-07 VITALS — BP 140/70 | HR 80 | Temp 98.7°F | Ht 69.0 in | Wt 160.0 lb

## 2020-10-07 DIAGNOSIS — J069 Acute upper respiratory infection, unspecified: Secondary | ICD-10-CM | POA: Diagnosis not present

## 2020-10-07 NOTE — Progress Notes (Signed)
Subjective:    Patient ID: Andrew [name], male    DOB: 18-Feb-1941, 79 y.o.   MRN: [id number]  Sore Throat   Sinus Problem  09/27/20 Patient reports pain and pressure in his frontal sinuses for 2 to 3 days.  He reports postnasal drip and a sore scratchy throat.  He denies any fever.  He denies any cough.  He denies any chest pain.  He denies any shortness of breath.  He denies any pleurisy.  He denies any sudden change in his sense of taste or smell.  He denies any nausea or vomiting or diarrhea.  At that time, my plan was: I believe the patient likely has a viral rhinosinusitis.  I recommended symptomatic care only including Zyrtec 10 mg a day for drainage and sinus inflammation as well as Flonase 2 sprays each nostril daily.  Allow tincture of time.  I anticipate that symptoms improve over the next 3 to 4 days spontaneously.  If he develops high fever or purulent sinus drainage at that point he can start amoxicillin 875 mg twice daily but I do not feel that he needs it at this time.  10/07/20 Patient states that his symptoms worsened after I saw him.  He developed a fever to 100.  He developed a worsening sinus headache.  He developed worsening pain and pressure in his frontal and maxillary sinuses.  He reports thick head congestion, postnasal drip, sore throat.  He started taking the amoxicillin on 12/23.  He then developed a cough and body aches.  The cough and body aches and fever are improving today.  The cough is essentially subsided.  He continues to have a sinus headache and body aches however.  He has not had a fever in the last 48 hours.  He denies any chest pain shortness of breath or dyspnea on exertion or pleurisy.  He does have some faint expiratory wheezing on the right side.  Left side is completely clear to auscultation. Past Medical History:  Diagnosis Date  . Allergy    grass etc.  . Barrett's esophagus   . BPH (benign prostatic hyperplasia)   . Chest pain, atypical   .  Chronic kidney disease    kidney stones  . Coronary artery disease   . GERD (gastroesophageal reflux disease)    Barrett's  . Hernia   . Hyperlipidemia   . Hypertension   . Myocardial infarction ([place])   . Thrombocytopenia ([place])   . Ulcer 1960   Past Surgical History:  Procedure Laterality Date  . CARDIAC CATHETERIZATION  02/14/2007   MITRAL REGURGITATION. LV NORMAL  . CHOLECYSTECTOMY    . COLONOSCOPY    . CORONARY ARTERY BYPASS GRAFT     twice  2 by passes each time  . INGUINAL HERNIA REPAIR     bilateral done twice  . LITHOTRIPSY    . UPPER GASTROINTESTINAL ENDOSCOPY     Current Outpatient Medications on File Prior to Visit  Medication Sig Dispense Refill  . amoxicillin (AMOXIL) 875 MG tablet Take 1 tablet (875 mg total) by mouth 2 (two) times daily. 20 tablet 0  . aspirin 81 MG tablet Take 81 mg by mouth daily.    . Cholecalciferol (VITAMIN D) 2000 units CAPS Take 1 capsule by mouth daily.    [name] dutasteride (AVODART) 0.5 MG capsule     . finasteride (PROSCAR) 5 MG tablet TAKE 1 TABLET EVERY DAY 90 tablet 3  . lansoprazole (PREVACID) 30 MG capsule Take 1  capsule (30 mg total) by mouth daily. 90 capsule 3  . metoprolol succinate (TOPROL-XL) 25 MG 24 hr tablet Take 0.5 tablets (12.5 mg total) by mouth daily. 45 tablet 3  . Multiple Vitamin (MULTIVITAMIN) tablet Take 1 tablet by mouth daily.    . nitroGLYCERIN (NITROSTAT) 0.4 MG SL tablet Place 1 tablet (0.4 mg total) under the tongue every 5 (five) minutes as needed. 75 tablet 3  . Omega-3 Fatty Acids (FISH OIL) 1000 MG CPDR Take 1,000 mg by mouth every morning.    . simvastatin (ZOCOR) 10 MG tablet TAKE 1 TABLET(10 MG) BY MOUTH AT BEDTIME 90 tablet 3  . tamsulosin (FLOMAX) 0.4 MG CAPS capsule TAKE 1 CAPSULE (0.4 MG TOTAL) BY MOUTH DAILY. 90 capsule 1   Current Facility-Administered Medications on File Prior to Visit  Medication Dose Route Frequency Provider Last Rate Last Admin  . 0.9 %  sodium chloride infusion  500 mL  Intravenous Once Armbruster, Willaim Rayas, MD       Allergies  Allergen Reactions  . Imdur [Isosorbide Mononitrate]     Unknown, per pt   . Meloxicam Swelling    Facial swelling    Social History   Socioeconomic History  . Marital status: Married    Spouse name: Not on file  . Number of children: 2  . Years of education: Not on file  . Highest education level: Not on file  Occupational History  . Occupation: Retired  Tobacco Use  . Smoking status: Never Smoker  . Smokeless tobacco: Never Used  Vaping Use  . Vaping Use: Never used  Substance and Sexual Activity  . Alcohol use: No  . Drug use: No  . Sexual activity: Not Currently  Other Topics Concern  . Not on file  Social History Narrative  . Not on file   Social Determinants of Health   Financial Resource Strain: Low Risk   . Difficulty of Paying Living Expenses: Not hard at all  Food Insecurity: No Food Insecurity  . Worried About Programme researcher, broadcasting/film/video in the Last Year: Never true  . Ran Out of Food in the Last Year: Never true  Transportation Needs: No Transportation Needs  . Lack of Transportation (Medical): No  . Lack of Transportation (Non-Medical): No  Physical Activity: Inactive  . Days of Exercise per Week: 0 days  . Minutes of Exercise per Session: 0 min  Stress: No Stress Concern Present  . Feeling of Stress : Not at all  Social Connections: Moderately Integrated  . Frequency of Communication with Friends and Family: More than three times a week  . Frequency of Social Gatherings with Friends and Family: Three times a week  . Attends Religious Services: 1 to 4 times per year  . Active Member of Clubs or Organizations: No  . Attends Banker Meetings: Never  . Marital Status: Married  Catering manager Violence: Not At Risk  . Fear of Current or Ex-Partner: No  . Emotionally Abused: No  . Physically Abused: No  . Sexually Abused: No      Review of Systems  All other systems reviewed and  are negative.      Objective:   Physical Exam Vitals reviewed.  Constitutional:      General: He is not in acute distress.    Appearance: Normal appearance. He is normal weight. He is not ill-appearing, toxic-appearing or diaphoretic.  HENT:     Nose: Mucosal edema, congestion and rhinorrhea present.  Right Turbinates: Swollen and pale.     Left Turbinates: Swollen and pale.     Right Sinus: No maxillary sinus tenderness or frontal sinus tenderness.     Left Sinus: No maxillary sinus tenderness or frontal sinus tenderness.     Mouth/Throat:     Pharynx: No oropharyngeal exudate or posterior oropharyngeal erythema.  Eyes:     General:        Right eye: No discharge.        Left eye: No discharge.  Cardiovascular:     Rate and Rhythm: Normal rate and regular rhythm.     Heart sounds: Normal heart sounds. No murmur heard. No friction rub. No gallop.   Pulmonary:     Effort: Pulmonary effort is normal. No respiratory distress.     Breath sounds: No stridor. Wheezing present. No rhonchi or rales.  Chest:     Chest wall: No tenderness.  Abdominal:     General: Bowel sounds are normal.     Palpations: Abdomen is soft.  Musculoskeletal:     Right lower leg: No edema.     Left lower leg: No edema.  Neurological:     Mental Status: He is alert.           Assessment & Plan:  Upper respiratory tract infection, unspecified type - Plan: SARS-COV-2 RNA,(COVID-19) QUAL NAAT I believe the patient had a bad head cold versus a sinus infection.  I will screen the patient for COVID-19 primarily to know if anyone around him is been exposed.  At this point he is already on antibiotics of instructed him to finish them.  However clinically he does not appear toxic.  He is breathing without any increased effort.  His lungs are relatively clear to auscultation aside from some faint expiratory wheezing.  I see no indication for pneumonia.  He is afebrile today at 98.7 and pulse oximetry is  normal at 96%.  Respiratory rate is normal and he has nonlabored.

## 2020-10-10 LAB — SARS-COV-2 RNA,(COVID-19) QUALITATIVE NAAT: SARS CoV2 RNA: NOT DETECTED

## 2020-10-15 ENCOUNTER — Other Ambulatory Visit: Payer: Self-pay

## 2020-10-15 ENCOUNTER — Ambulatory Visit (INDEPENDENT_AMBULATORY_CARE_PROVIDER_SITE_OTHER): Payer: Medicare HMO | Admitting: Family Medicine

## 2020-10-15 ENCOUNTER — Encounter: Payer: Self-pay | Admitting: Family Medicine

## 2020-10-15 VITALS — BP 136/74 | HR 74 | Temp 98.4°F | Ht 69.0 in | Wt 162.0 lb

## 2020-10-15 DIAGNOSIS — J069 Acute upper respiratory infection, unspecified: Secondary | ICD-10-CM

## 2020-10-15 NOTE — Progress Notes (Signed)
Subjective:    Patient ID: Andrew [name], male    DOB: 20-May-1941, 80 y.o.   MRN: [id number]  Sore Throat   Sinus Problem  09/27/20 Patient reports pain and pressure in his frontal sinuses for 2 to 3 days.  He reports postnasal drip and a sore scratchy throat.  He denies any fever.  He denies any cough.  He denies any chest pain.  He denies any shortness of breath.  He denies any pleurisy.  He denies any sudden change in his sense of taste or smell.  He denies any nausea or vomiting or diarrhea.  At that time, my plan was: I believe the patient likely has a viral rhinosinusitis.  I recommended symptomatic care only including Zyrtec 10 mg a day for drainage and sinus inflammation as well as Flonase 2 sprays each nostril daily.  Allow tincture of time.  I anticipate that symptoms improve over the next 3 to 4 days spontaneously.  If he develops high fever or purulent sinus drainage at that point he can start amoxicillin 875 mg twice daily but I do not feel that he needs it at this time.  10/07/20 Patient states that his symptoms worsened after I saw him.  He developed a fever to 100.  He developed a worsening sinus headache.  He developed worsening pain and pressure in his frontal and maxillary sinuses.  He reports thick head congestion, postnasal drip, sore throat.  He started taking the amoxicillin on 12/23.  He then developed a cough and body aches.  The cough and body aches and fever are improving today.  The cough is essentially subsided.  He continues to have a sinus headache and body aches however.  He has not had a fever in the last 48 hours.  He denies any chest pain shortness of breath or dyspnea on exertion or pleurisy.  He does have some faint expiratory wheezing on the right side.  Left side is completely clear to auscultation.  At that time, my plan was: I believe the patient had a bad head cold versus a sinus infection.  I will screen the patient for COVID-19 primarily to know if anyone  around him is been exposed.  At this point he is already on antibiotics of instructed him to finish them.  However clinically he does not appear toxic.  He is breathing without any increased effort.  His lungs are relatively clear to auscultation aside from some faint expiratory wheezing.  I see no indication for pneumonia.  He is afebrile today at 98.7 and pulse oximetry is normal at 96%.  Respiratory rate is normal and he has nonlabored.  10/15/20 Patient presents today for follow-up.  He is scheduled to get his Covid booster on Friday and he wanted to make sure that the infection was "clear" before he got his Covid shot.  He states that he had not had a fever in 8 days.  He denies any headaches or sinus pain.  He denies any cough or shortness of breath.  He still has some mild chest congestion but he denies any wheezing or pleurisy or fevers or hemoptysis or purulent sputum. Past Medical History:  Diagnosis Date  . Allergy    grass etc.  . Barrett's esophagus   . BPH (benign prostatic hyperplasia)   . Chest pain, atypical   . Chronic kidney disease    kidney stones  . Coronary artery disease   . GERD (gastroesophageal reflux disease)    Barrett's  .  Hernia   . Hyperlipidemia   . Hypertension   . Myocardial infarction ([place])   . Thrombocytopenia ([place])   . Ulcer 1960   Past Surgical History:  Procedure Laterality Date  . CARDIAC CATHETERIZATION  02/14/2007   MITRAL REGURGITATION. LV NORMAL  . CHOLECYSTECTOMY    . COLONOSCOPY    . CORONARY ARTERY BYPASS GRAFT     twice  2 by passes each time  . INGUINAL HERNIA REPAIR     bilateral done twice  . LITHOTRIPSY    . UPPER GASTROINTESTINAL ENDOSCOPY     Current Outpatient Medications on File Prior to Visit  Medication Sig Dispense Refill  . aspirin 81 MG tablet Take 81 mg by mouth daily.    . Cholecalciferol (VITAMIN D) 2000 units CAPS Take 1 capsule by mouth daily.    [name] dutasteride (AVODART) 0.5 MG capsule     . finasteride (PROSCAR) 5  MG tablet TAKE 1 TABLET EVERY DAY 90 tablet 3  . lansoprazole (PREVACID) 30 MG capsule Take 1 capsule (30 mg total) by mouth daily. 90 capsule 3  . metoprolol succinate (TOPROL-XL) 25 MG 24 hr tablet Take 0.5 tablets (12.5 mg total) by mouth daily. 45 tablet 3  . Multiple Vitamin (MULTIVITAMIN) tablet Take 1 tablet by mouth daily.    . nitroGLYCERIN (NITROSTAT) 0.4 MG SL tablet Place 1 tablet (0.4 mg total) under the tongue every 5 (five) minutes as needed. 75 tablet 3  . Omega-3 Fatty Acids (FISH OIL) 1000 MG CPDR Take 1,000 mg by mouth every morning.    . simvastatin (ZOCOR) 10 MG tablet TAKE 1 TABLET(10 MG) BY MOUTH AT BEDTIME 90 tablet 3  . tamsulosin (FLOMAX) 0.4 MG CAPS capsule TAKE 1 CAPSULE (0.4 MG TOTAL) BY MOUTH DAILY. 90 capsule 1   Current Facility-Administered Medications on File Prior to Visit  Medication Dose Route Frequency Provider Last Rate Last Admin  . 0.9 %  sodium chloride infusion  500 mL Intravenous Once Armbruster, Carlota Raspberry, MD       Allergies  Allergen Reactions  . Imdur [Isosorbide Mononitrate]     Unknown, per pt   . Meloxicam Swelling    Facial swelling    Social History   Socioeconomic History  . Marital status: Married    Spouse name: Not on file  . Number of children: 2  . Years of education: Not on file  . Highest education level: Not on file  Occupational History  . Occupation: Retired  Tobacco Use  . Smoking status: Never Smoker  . Smokeless tobacco: Never Used  Vaping Use  . Vaping Use: Never used  Substance and Sexual Activity  . Alcohol use: No  . Drug use: No  . Sexual activity: Not Currently  Other Topics Concern  . Not on file  Social History Narrative  . Not on file   Social Determinants of Health   Financial Resource Strain: Low Risk   . Difficulty of Paying Living Expenses: Not hard at all  Food Insecurity: No Food Insecurity  . Worried About Charity fundraiser in the Last Year: Never true  . Ran Out of Food in the Last  Year: Never true  Transportation Needs: No Transportation Needs  . Lack of Transportation (Medical): No  . Lack of Transportation (Non-Medical): No  Physical Activity: Inactive  . Days of Exercise per Week: 0 days  . Minutes of Exercise per Session: 0 min  Stress: No Stress Concern Present  . Feeling of Stress :  Not at all  Social Connections: Moderately Integrated  . Frequency of Communication with Friends and Family: More than three times a week  . Frequency of Social Gatherings with Friends and Family: Three times a week  . Attends Religious Services: 1 to 4 times per year  . Active Member of Clubs or Organizations: No  . Attends Archivist Meetings: Never  . Marital Status: Married  Human resources officer Violence: Not At Risk  . Fear of Current or Ex-Partner: No  . Emotionally Abused: No  . Physically Abused: No  . Sexually Abused: No      Review of Systems  All other systems reviewed and are negative.      Objective:   Physical Exam Vitals reviewed.  Constitutional:      General: He is not in acute distress.    Appearance: Normal appearance. He is normal weight. He is not ill-appearing, toxic-appearing or diaphoretic.  HENT:     Right Ear: Tympanic membrane and ear canal normal.     Left Ear: Tympanic membrane and ear canal normal.     Nose: No mucosal edema, congestion or rhinorrhea.     Right Turbinates: Not swollen or pale.     Left Turbinates: Not swollen or pale.     Right Sinus: No maxillary sinus tenderness or frontal sinus tenderness.     Left Sinus: No maxillary sinus tenderness or frontal sinus tenderness.     Mouth/Throat:     Pharynx: No oropharyngeal exudate or posterior oropharyngeal erythema.  Eyes:     General:        Right eye: No discharge.        Left eye: No discharge.  Cardiovascular:     Rate and Rhythm: Normal rate and regular rhythm.     Heart sounds: Normal heart sounds. No murmur heard. No friction rub. No gallop.   Pulmonary:      Effort: Pulmonary effort is normal. No respiratory distress.     Breath sounds: No stridor. No wheezing, rhonchi or rales.  Chest:     Chest wall: No tenderness.  Abdominal:     General: Bowel sounds are normal.     Palpations: Abdomen is soft.  Musculoskeletal:     Right lower leg: No edema.     Left lower leg: No edema.  Neurological:     Mental Status: He is alert.           Assessment & Plan:  Upper respiratory tract infection, unspecified type  Symptoms have almost completely resolved.  I see no contraindications to him having his Covid booster.  I encouraged him to go ahead and get it Friday.  We did discuss symptomatic treatment for upper respiratory infections over the winter.

## 2020-10-16 ENCOUNTER — Telehealth: Payer: Self-pay | Admitting: Pharmacist

## 2020-10-16 NOTE — Chronic Care Management (AMB) (Addendum)
Chronic Care Management Pharmacy Assistant   Name: Andrew [name]  MRN: [id number] DOB: 07-01-1941   Reason for Encounter: Disease State for DM  Patient Questions:  1.  Have you seen any other providers since your last visit? Yes.   2.  Any changes in your medicines or health? Yes.   PCP : Susy Frizzle, MD   Their chronic conditions include: HTN, CAD, Barrett's Esophagus, HLD, Pre-DM.  Office Visits: 10/15/20 with Dr. Dennard Schaumann for URI F/U. Stopped amoxicillin 10/07/20 with Dr. Dennard Schaumann for URI. No medication changes. 09/27/20 with Dr. Dennard Schaumann for sinus. Started Amoxicillin.  08/27/20 with Dr. Dennard Schaumann for DM. No medication changes.  08/13/20 with Dr. Dennard Schaumann for allergies. No medication changes.   Consults: 09/17/20 Oncology No medication changes. 08/14/20 Urology with Dr. Jeffie Pollock   Allergies:   Allergies  Allergen Reactions   Imdur [Isosorbide Mononitrate]     Unknown, per pt    Meloxicam Swelling    Facial swelling     Medications: Outpatient Encounter Medications as of 10/16/2020  Medication Sig   aspirin 81 MG tablet Take 81 mg by mouth daily.   Cholecalciferol (VITAMIN D) 2000 units CAPS Take 1 capsule by mouth daily.   dutasteride (AVODART) 0.5 MG capsule    finasteride (PROSCAR) 5 MG tablet TAKE 1 TABLET EVERY DAY   lansoprazole (PREVACID) 30 MG capsule Take 1 capsule (30 mg total) by mouth daily.   metoprolol succinate (TOPROL-XL) 25 MG 24 hr tablet Take 0.5 tablets (12.5 mg total) by mouth daily.   Multiple Vitamin (MULTIVITAMIN) tablet Take 1 tablet by mouth daily.   nitroGLYCERIN (NITROSTAT) 0.4 MG SL tablet Place 1 tablet (0.4 mg total) under the tongue every 5 (five) minutes as needed.   Omega-3 Fatty Acids (FISH OIL) 1000 MG CPDR Take 1,000 mg by mouth every morning.   simvastatin (ZOCOR) 10 MG tablet TAKE 1 TABLET(10 MG) BY MOUTH AT BEDTIME   tamsulosin (FLOMAX) 0.4 MG CAPS capsule TAKE 1 CAPSULE (0.4 MG TOTAL) BY MOUTH DAILY.   Facility-Administered  Encounter Medications as of 10/16/2020  Medication   0.9 %  sodium chloride infusion    Current Diagnosis: Patient Active Problem List   Diagnosis Date Noted   Thrombocytopenia ([place])    Bilateral carotid artery disease ([place]) 08/04/2016   Poor posture 02/22/2014   Stiffness of joints, not elsewhere classified, multiple sites 02/22/2014   Prediabetes 01/05/2013   Special screening for malignant neoplasms, colon 04/27/2011   Benign neoplasm of colon 04/27/2011   GERD (gastroesophageal reflux disease) 04/27/2011   Diverticulosis of colon (without mention of hemorrhage) 04/27/2011   CAD (coronary artery disease) 12/27/2010   Environmental allergies 12/27/2010   Insomnia 12/27/2010   H. pylori infection 12/27/2010   Barrett's esophagus 12/27/2010   Hyperlipidemia 11/08/2008   HTN (hypertension) 11/08/2008   GERD 11/08/2008   ACUT PEPTC ULCR UNS SITE W/HEM W/O MENTION OBST 11/08/2008   RENAL CALCULUS 11/08/2008   Obstructive sleep apnea 04/12/2003    Goals Addressed   None    Recent Relevant Labs: Lab Results  Component Value Date/Time   HGBA1C 6.8 (H) 08/27/2020 08:59 AM   HGBA1C 7.2 (H) 05/31/2020 08:48 AM   MICROALBUR 0.9 08/27/2020 08:59 AM   MICROALBUR 0.6 02/15/2020 08:45 AM    Kidney Function Lab Results  Component Value Date/Time   CREATININE 0.89 08/27/2020 08:59 AM   CREATININE 0.86 05/31/2020 08:48 AM   CREATININE 0.89 02/29/2020 03:54 PM   CREATININE 0.96 02/08/2020 08:02 AM  GFR 91.76 07/08/2015 07:39 AM   GFRNONAA 80 08/27/2020 08:59 AM   GFRAA 93 08/27/2020 08:59 AM    Current antihyperglycemic regimen:  No medications.  What recent interventions/DTPs have been made to improve glycemic control:  None.  Have there been any recent hospitalizations or ED visits since last visit with CPP? No.  Patient reports hypoglycemic symptoms, including None   Patient reports hyperglycemic symptoms, including none  How often are you checking your blood sugar?  Patient does not checking his sugar at this time.   What are your blood sugars ranging?  Fasting: N/A Before meals: N/A After meals: N/A Bedtime: N/A  During the week, how often does your blood glucose drop below 70? Patient  does not checking his sugar at this time.  Are you checking your feet daily/regularly?   Patient was informed to check his feet daily and what to look for.  Adherence Review: Is the patient currently on a STATIN medication? Yes, Simvastatin 10 mg.  Is the patient currently on ACE/ARB medication? No.  Does the patient have >5 day gap between last estimated fill dates? No, CPP Please Check.   Patient stated he gets on a treadmill for 15 min each morning. He watches what he eats he stays away from sweets and tries to eat balanced diet. Patient stated he gets his all of his medications from [facility] mail order and if he needs any medications right away he goes to [facility].  Follow-Up:  Pharmacist Review   Hulen Luster, RMA Clinical Pharmacist Assistant [phone]  4 minutes spent in review, coordination, and documentation.  Reviewed by: Willa Frater, PharmD Clinical Pharmacist [facility] [phone]

## 2020-10-22 ENCOUNTER — Other Ambulatory Visit: Payer: Self-pay | Admitting: Family Medicine

## 2020-12-06 ENCOUNTER — Telehealth: Payer: Self-pay | Admitting: Family Medicine

## 2020-12-06 ENCOUNTER — Telehealth: Payer: Self-pay

## 2020-12-06 NOTE — Telephone Encounter (Signed)
Wasnt sure if time to have is lab drawn

## 2020-12-06 NOTE — Telephone Encounter (Signed)
Made pt appt. Wanted labs drawn. Explained to pt A1c will not be cover right now due to it being under a 7.

## 2020-12-06 NOTE — Telephone Encounter (Signed)
Pt states he needed an appt for dm follow up. Explained to pt that A1c will not be covered due to it being under a 7. Pt verbalized understanding, however did not want to wait 6 months before nx appt. Appt scheduled for 12/10/20. Patient says he will be fasting

## 2020-12-09 ENCOUNTER — Other Ambulatory Visit: Payer: Medicare HMO

## 2020-12-09 ENCOUNTER — Other Ambulatory Visit: Payer: Self-pay

## 2020-12-09 DIAGNOSIS — I10 Essential (primary) hypertension: Secondary | ICD-10-CM

## 2020-12-09 DIAGNOSIS — I2510 Atherosclerotic heart disease of native coronary artery without angina pectoris: Secondary | ICD-10-CM

## 2020-12-09 DIAGNOSIS — E7800 Pure hypercholesterolemia, unspecified: Secondary | ICD-10-CM

## 2020-12-09 DIAGNOSIS — D696 Thrombocytopenia, unspecified: Secondary | ICD-10-CM

## 2020-12-09 DIAGNOSIS — E118 Type 2 diabetes mellitus with unspecified complications: Secondary | ICD-10-CM | POA: Diagnosis not present

## 2020-12-10 ENCOUNTER — Ambulatory Visit: Payer: Self-pay | Admitting: Family Medicine

## 2020-12-10 LAB — LIPID PANEL
Cholesterol: 110 mg/dL (ref <200)
HDL: 36 mg/dL — ABNORMAL LOW (ref >=40)
LDL Cholesterol (Calc): 57 mg/dL (calc)
Non-HDL Cholesterol (Calc): 74 mg/dL (calc) (ref <130)
Total CHOL/HDL Ratio: 3.1 (calc) (ref <5.0)
Triglycerides: 91 mg/dL (ref <150)

## 2020-12-10 LAB — CBC WITH DIFFERENTIAL/PLATELET
Absolute Monocytes: 570 cells/uL (ref 200–950)
Basophils Absolute: 52 cells/uL (ref 0–200)
Basophils Relative: 0.7 %
Eosinophils Absolute: 89 cells/uL (ref 15–500)
Eosinophils Relative: 1.2 %
HCT: 42.1 % (ref 38.5–50.0)
Hemoglobin: 14.3 g/dL (ref 13.2–17.1)
Lymphs Abs: 1680 cells/uL (ref 850–3900)
MCH: 29.9 pg (ref 27.0–33.0)
MCHC: 34 g/dL (ref 32.0–36.0)
MCV: 88.1 fL (ref 80.0–100.0)
MPV: 11.4 fL (ref 7.5–12.5)
Monocytes Relative: 7.7 %
Neutro Abs: 5010 cells/uL (ref 1500–7800)
Neutrophils Relative %: 67.7 %
Platelets: 68 10*3/uL — ABNORMAL LOW (ref 140–400)
RBC: 4.78 10*6/uL (ref 4.20–5.80)
RDW: 12.5 % (ref 11.0–15.0)
Total Lymphocyte: 22.7 %
WBC: 7.4 10*3/uL (ref 3.8–10.8)

## 2020-12-10 LAB — COMPLETE METABOLIC PANEL WITH GFR
AG Ratio: 2.1 (calc) (ref 1.0–2.5)
ALT: 11 U/L (ref 9–46)
AST: 14 U/L (ref 10–35)
Albumin: 4 g/dL (ref 3.6–5.1)
Alkaline phosphatase (APISO): 55 U/L (ref 35–144)
BUN: 17 mg/dL (ref 7–25)
CO2: 28 mmol/L (ref 20–32)
Calcium: 8.7 mg/dL (ref 8.6–10.3)
Chloride: 103 mmol/L (ref 98–110)
Creat: 0.86 mg/dL (ref 0.70–1.11)
GFR, Est African American: 94 mL/min/{1.73_m2} (ref >=60)
GFR, Est Non African American: 81 mL/min/{1.73_m2} (ref >=60)
Globulin: 1.9 g/dL (calc) (ref 1.9–3.7)
Glucose, Bld: 171 mg/dL — ABNORMAL HIGH (ref 65–99)
Potassium: 4.7 mmol/L (ref 3.5–5.3)
Sodium: 139 mmol/L (ref 135–146)
Total Bilirubin: 0.6 mg/dL (ref 0.2–1.2)
Total Protein: 5.9 g/dL — ABNORMAL LOW (ref 6.1–8.1)

## 2020-12-10 LAB — HEMOGLOBIN A1C
Hgb A1c MFr Bld: 7 % of total Hgb — ABNORMAL HIGH (ref <5.7)
Mean Plasma Glucose: 154 mg/dL
eAG (mmol/L): 8.5 mmol/L

## 2020-12-12 ENCOUNTER — Ambulatory Visit (INDEPENDENT_AMBULATORY_CARE_PROVIDER_SITE_OTHER): Payer: Medicare HMO | Admitting: Family Medicine

## 2020-12-12 ENCOUNTER — Other Ambulatory Visit: Payer: Self-pay

## 2020-12-12 VITALS — BP 150/70 | HR 68 | Temp 98.6°F | Wt 161.0 lb

## 2020-12-12 DIAGNOSIS — D696 Thrombocytopenia, unspecified: Secondary | ICD-10-CM | POA: Diagnosis not present

## 2020-12-12 DIAGNOSIS — I10 Essential (primary) hypertension: Secondary | ICD-10-CM | POA: Diagnosis not present

## 2020-12-12 DIAGNOSIS — E7800 Pure hypercholesterolemia, unspecified: Secondary | ICD-10-CM | POA: Diagnosis not present

## 2020-12-12 DIAGNOSIS — E118 Type 2 diabetes mellitus with unspecified complications: Secondary | ICD-10-CM

## 2020-12-12 DIAGNOSIS — I2510 Atherosclerotic heart disease of native coronary artery without angina pectoris: Secondary | ICD-10-CM

## 2020-12-12 NOTE — Progress Notes (Signed)
Subjective:    Patient ID: Andrew [name], male    DOB: 12/31/1940, 80 y.o.   MRN: [id number]  Checkup. He denies any chest pain shortness of breath dyspnea on exertion. He denies any polyuria, polydipsia, blurry vision. He denies any nausea or vomiting or diarrhea. Lab on 12/09/2020  Component Date Value Ref Range Status  . WBC 12/09/2020 7.4  3.8 - 10.8 Thousand/uL Final  . RBC 12/09/2020 4.78  4.20 - 5.80 Million/uL Final  . Hemoglobin 12/09/2020 14.3  13.2 - 17.1 g/dL Final  . HCT 12/09/2020 42.1  38.5 - 50.0 % Final  . MCV 12/09/2020 88.1  80.0 - 100.0 fL Final  . MCH 12/09/2020 29.9  27.0 - 33.0 pg Final  . MCHC 12/09/2020 34.0  32.0 - 36.0 g/dL Final  . RDW 12/09/2020 12.5  11.0 - 15.0 % Final  . Platelets 12/09/2020 68* 140 - 400 Thousand/uL Final   Platelet clumps noted on smear-count appears adequate.  [name] MPV 12/09/2020 11.4  7.5 - 12.5 fL Final  . Neutro Abs 12/09/2020 5,010  1,500 - 7,800 cells/uL Final  . Lymphs Abs 12/09/2020 1,680  850 - 3,900 cells/uL Final  . Absolute Monocytes 12/09/2020 570  200 - 950 cells/uL Final  . Eosinophils Absolute 12/09/2020 89  15 - 500 cells/uL Final  . Basophils Absolute 12/09/2020 52  0 - 200 cells/uL Final  . Neutrophils Relative % 12/09/2020 67.7  % Final  . Total Lymphocyte 12/09/2020 22.7  % Final  . Monocytes Relative 12/09/2020 7.7  % Final  . Eosinophils Relative 12/09/2020 1.2  % Final  . Basophils Relative 12/09/2020 0.7  % Final  . Glucose, Bld 12/09/2020 171* 65 - 99 mg/dL Final   Comment: .            Fasting reference interval . For someone without known diabetes, a glucose value >125 mg/dL indicates that they may have diabetes and this should be confirmed with a follow-up test. .   . BUN 12/09/2020 17  7 - 25 mg/dL Final  . Creat 12/09/2020 0.86  0.70 - 1.11 mg/dL Final   Comment: For patients >17 years of age, the reference limit for Creatinine is approximately 13% higher for people identified as  African-American. .   . GFR, Est Non African American 12/09/2020 81  > OR = 60 mL/min/1.52m2 Final  . GFR, Est African American 12/09/2020 94  > OR = 60 mL/min/1.11m2 Final  . BUN/Creatinine Ratio 27/74/1287 NOT APPLICABLE  6 - 22 (calc) Final  . Sodium 12/09/2020 139  135 - 146 mmol/L Final  . Potassium 12/09/2020 4.7  3.5 - 5.3 mmol/L Final  . Chloride 12/09/2020 103  98 - 110 mmol/L Final  . CO2 12/09/2020 28  20 - 32 mmol/L Final  . Calcium 12/09/2020 8.7  8.6 - 10.3 mg/dL Final  . Total Protein 12/09/2020 5.9* 6.1 - 8.1 g/dL Final  . Albumin 12/09/2020 4.0  3.6 - 5.1 g/dL Final  . Globulin 12/09/2020 1.9  1.9 - 3.7 g/dL (calc) Final  . AG Ratio 12/09/2020 2.1  1.0 - 2.5 (calc) Final  . Total Bilirubin 12/09/2020 0.6  0.2 - 1.2 mg/dL Final  . Alkaline phosphatase (APISO) 12/09/2020 55  35 - 144 U/L Final  . AST 12/09/2020 14  10 - 35 U/L Final  . ALT 12/09/2020 11  9 - 46 U/L Final  . Hgb A1c MFr Bld 12/09/2020 7.0* <5.7 % of total Hgb Final   Comment: For someone  without known diabetes, a hemoglobin A1c value of 6.5% or greater indicates that they may have  diabetes and this should be confirmed with a follow-up  test. . For someone with known diabetes, a value <7% indicates  that their diabetes is well controlled and a value  greater than or equal to 7% indicates suboptimal  control. A1c targets should be individualized based on  duration of diabetes, age, comorbid conditions, and  other considerations. . Currently, no consensus exists regarding use of hemoglobin A1c for diagnosis of diabetes for children. .   . Mean Plasma Glucose 12/09/2020 154  mg/dL Final  . eAG (mmol/L) 12/09/2020 8.5  mmol/L Final  . Cholesterol 12/09/2020 110  <200 mg/dL Final  . HDL 12/09/2020 36* > OR = 40 mg/dL Final  . Triglycerides 12/09/2020 91  <150 mg/dL Final  . LDL Cholesterol (Calc) 12/09/2020 57  mg/dL (calc) Final   Comment: Reference range: <100 . Desirable range <100 mg/dL for  primary prevention;   <70 mg/dL for patients with CHD or diabetic patients  with > or = 2 CHD risk factors. [name] LDL-C is now calculated using the Martin-Hopkins  calculation, which is a validated novel method providing  better accuracy than the Friedewald equation in the  estimation of LDL-C.  Cresenciano Genre et al. Annamaria Helling. 6270;350(09): 2061-2068  (http://education.[facility].com/faq/FAQ164)   . Total CHOL/HDL Ratio 12/09/2020 3.1  <5.0 (calc) Final  . Non-HDL Cholesterol (Calc) 12/09/2020 74  <130 mg/dL (calc) Final   Comment: For patients with diabetes plus 1 major ASCVD risk  factor, treating to a non-HDL-C goal of <100 mg/dL  (LDL-C of <70 mg/dL) is considered a therapeutic  option.     Past Medical History:  Diagnosis Date  . Allergy    grass etc.  . Barrett's esophagus   . BPH (benign prostatic hyperplasia)   . Chest pain, atypical   . Chronic kidney disease    kidney stones  . Coronary artery disease   . GERD (gastroesophageal reflux disease)    Barrett's  . Hernia   . Hyperlipidemia   . Hypertension   . Myocardial infarction ([place])   . Thrombocytopenia ([place])   . Ulcer 1960   Past Surgical History:  Procedure Laterality Date  . CARDIAC CATHETERIZATION  02/14/2007   MITRAL REGURGITATION. LV NORMAL  . CHOLECYSTECTOMY    . COLONOSCOPY    . CORONARY ARTERY BYPASS GRAFT     twice  2 by passes each time  . INGUINAL HERNIA REPAIR     bilateral done twice  . LITHOTRIPSY    . UPPER GASTROINTESTINAL ENDOSCOPY     Current Outpatient Medications on File Prior to Visit  Medication Sig Dispense Refill  . aspirin 81 MG tablet Take 81 mg by mouth daily.    . Cholecalciferol (VITAMIN D) 2000 units CAPS Take 1 capsule by mouth daily.    . finasteride (PROSCAR) 5 MG tablet TAKE 1 TABLET(5 MG) BY MOUTH DAILY. 90 tablet 3  . metoprolol succinate (TOPROL-XL) 25 MG 24 hr tablet Take 0.5 tablets (12.5 mg total) by mouth daily. 45 tablet 3  . Multiple Vitamin (MULTIVITAMIN) tablet  Take 1 tablet by mouth daily.    . Omega-3 Fatty Acids (FISH OIL) 1000 MG CPDR Take 1,000 mg by mouth every morning.    . simvastatin (ZOCOR) 10 MG tablet TAKE 1 TABLET(10 MG) BY MOUTH AT BEDTIME 90 tablet 3  . tamsulosin (FLOMAX) 0.4 MG CAPS capsule TAKE 1 CAPSULE (0.4 MG TOTAL) BY MOUTH DAILY.  90 capsule 1  . lansoprazole (PREVACID) 30 MG capsule Take 1 capsule (30 mg total) by mouth daily. (Patient not taking: Reported on 12/12/2020) 90 capsule 3  . nitroGLYCERIN (NITROSTAT) 0.4 MG SL tablet Place 1 tablet (0.4 mg total) under the tongue every 5 (five) minutes as needed. 75 tablet 3   Current Facility-Administered Medications on File Prior to Visit  Medication Dose Route Frequency Provider Last Rate Last Admin  . 0.9 %  sodium chloride infusion  500 mL Intravenous Once Armbruster, Carlota Raspberry, MD       Allergies  Allergen Reactions  . Imdur [Isosorbide Mononitrate]     Unknown, per pt   . Meloxicam Swelling    Facial swelling    Social History   Socioeconomic History  . Marital status: Married    Spouse name: Not on file  . Number of children: 2  . Years of education: Not on file  . Highest education level: Not on file  Occupational History  . Occupation: Retired  Tobacco Use  . Smoking status: Never Smoker  . Smokeless tobacco: Never Used  Vaping Use  . Vaping Use: Never used  Substance and Sexual Activity  . Alcohol use: No  . Drug use: No  . Sexual activity: Not Currently  Other Topics Concern  . Not on file  Social History Narrative  . Not on file   Social Determinants of Health   Financial Resource Strain: Low Risk   . Difficulty of Paying Living Expenses: Not hard at all  Food Insecurity: No Food Insecurity  . Worried About Charity fundraiser in the Last Year: Never true  . Ran Out of Food in the Last Year: Never true  Transportation Needs: No Transportation Needs  . Lack of Transportation (Medical): No  . Lack of Transportation (Non-Medical): No  Physical  Activity: Inactive  . Days of Exercise per Week: 0 days  . Minutes of Exercise per Session: 0 min  Stress: No Stress Concern Present  . Feeling of Stress : Not at all  Social Connections: Moderately Integrated  . Frequency of Communication with Friends and Family: More than three times a week  . Frequency of Social Gatherings with Friends and Family: Three times a week  . Attends Religious Services: 1 to 4 times per year  . Active Member of Clubs or Organizations: No  . Attends Archivist Meetings: Never  . Marital Status: Married  Human resources officer Violence: Not At Risk  . Fear of Current or Ex-Partner: No  . Emotionally Abused: No  . Physically Abused: No  . Sexually Abused: No      Review of Systems  All other systems reviewed and are negative.      Objective:   Physical Exam Vitals reviewed.  Constitutional:      General: He is not in acute distress.    Appearance: Normal appearance. He is normal weight. He is not ill-appearing or toxic-appearing.  HENT:     Head: Normocephalic and atraumatic.     Right Ear: Tympanic membrane, ear canal and external ear normal. There is no impacted cerumen.     Left Ear: Tympanic membrane, ear canal and external ear normal. There is no impacted cerumen.     Nose: Nose normal. No congestion or rhinorrhea.     Mouth/Throat:     Mouth: Mucous membranes are moist.     Pharynx: Oropharynx is clear. No oropharyngeal exudate or posterior oropharyngeal erythema.  Eyes:  Extraocular Movements: Extraocular movements intact.     Conjunctiva/sclera: Conjunctivae normal.     Pupils: Pupils are equal, round, and reactive to light.  Neck:     Vascular: No carotid bruit.  Cardiovascular:     Rate and Rhythm: Normal rate and regular rhythm.     Heart sounds: Normal heart sounds. No murmur heard. No friction rub. No gallop.   Pulmonary:     Effort: Pulmonary effort is normal. No respiratory distress.     Breath sounds: Normal breath  sounds. No stridor. No wheezing, rhonchi or rales.  Chest:     Chest wall: No tenderness.  Abdominal:     General: Bowel sounds are normal. There is no distension.     Palpations: Abdomen is soft. There is no mass.     Tenderness: There is no abdominal tenderness. There is no guarding or rebound.     Hernia: No hernia is present.  Musculoskeletal:     Cervical back: Normal range of motion and neck supple. No rigidity or tenderness.     Right lower leg: No edema.     Left lower leg: No edema.  Lymphadenopathy:     Cervical: No cervical adenopathy.  Skin:    General: Skin is warm.     Coloration: Skin is not jaundiced or pale.     Findings: No bruising, erythema, lesion or rash.  Neurological:     General: No focal deficit present.     Mental Status: He is alert and oriented to person, place, and time. Mental status is at baseline.     Cranial Nerves: No cranial nerve deficit.     Sensory: No sensory deficit.     Motor: No weakness.     Coordination: Coordination normal.     Gait: Gait normal.     Deep Tendon Reflexes: Reflexes normal.  Psychiatric:        Mood and Affect: Mood normal.        Behavior: Behavior normal.        Thought Content: Thought content normal.        Judgment: Judgment normal.           Assessment & Plan:  Controlled type 2 diabetes mellitus with complication, without long-term current use of insulin (HCC)  Coronary artery disease involving native coronary artery of native heart without angina pectoris  Benign essential HTN  Thrombocytopenia ([place])  Pure hypercholesterolemia  Blood pressure is elevated today. Start checking it every day at home and report the values to me in 1 week. If persistently elevated I would recommend adding lisinopril. Diabetes test is elevated at 7.0 but the patient would like to work on diet first he has been eating a lot of carbohydrates including Ensure and frozen yogurt on a daily basis. Cholesterol is outstanding.  Platelet count has dropped. This is being monitored by oncology as well. He denies any bleeding or bruising

## 2020-12-16 DIAGNOSIS — D485 Neoplasm of uncertain behavior of skin: Secondary | ICD-10-CM | POA: Diagnosis not present

## 2020-12-16 DIAGNOSIS — L905 Scar conditions and fibrosis of skin: Secondary | ICD-10-CM | POA: Diagnosis not present

## 2020-12-16 DIAGNOSIS — X32XXXD Exposure to sunlight, subsequent encounter: Secondary | ICD-10-CM | POA: Diagnosis not present

## 2020-12-16 DIAGNOSIS — D225 Melanocytic nevi of trunk: Secondary | ICD-10-CM | POA: Diagnosis not present

## 2020-12-16 DIAGNOSIS — L570 Actinic keratosis: Secondary | ICD-10-CM | POA: Diagnosis not present

## 2021-01-06 DIAGNOSIS — D485 Neoplasm of uncertain behavior of skin: Secondary | ICD-10-CM | POA: Diagnosis not present

## 2021-01-06 DIAGNOSIS — L988 Other specified disorders of the skin and subcutaneous tissue: Secondary | ICD-10-CM | POA: Diagnosis not present

## 2021-01-10 ENCOUNTER — Other Ambulatory Visit: Payer: Self-pay

## 2021-01-10 ENCOUNTER — Ambulatory Visit (INDEPENDENT_AMBULATORY_CARE_PROVIDER_SITE_OTHER): Payer: Medicare HMO | Admitting: Family Medicine

## 2021-01-10 VITALS — BP 128/50 | HR 75 | Temp 97.4°F | Ht 69.0 in | Wt 161.2 lb

## 2021-01-10 DIAGNOSIS — I10 Essential (primary) hypertension: Secondary | ICD-10-CM | POA: Diagnosis not present

## 2021-01-10 DIAGNOSIS — M545 Low back pain, unspecified: Secondary | ICD-10-CM

## 2021-01-10 MED ORDER — AMLODIPINE BESYLATE 5 MG PO TABS: 5.0000 mg | ORAL_TABLET | Freq: Every day | ORAL | 3 refills | Status: DC

## 2021-01-10 NOTE — Progress Notes (Signed)
Subjective:    Patient ID: Andrew [name], male    DOB: [birth year], 80 y.o.   MRN: [id number] Patient presents today with left-sided low back pain.  He states that when he sits at church for a prolonged period of time, as soon as he stands up, he has a difficult time standing up straight.  He reports sharp pain in his lower back just above the left gluteus muscle.  After he walks for a while, the muscle will limber up and the pain will start to improve.  As long as he is working around the home, his back feels okay.  However when he gets out of bed in the morning, his back will frequently feel extremely stiff and painful in the exact same area as the one circled on the diagram below.  He denies any dysuria or urgency or frequency or hematuria.  He denies any falls or injuries.  He denies any numbness or tingling in his legs.  He denies any paresthesias in his legs.  He denies any weakness in his legs.  Straight leg raise today is negative bilaterally.  His blood pressure at home has been averaging between 433 and 295 systolic occasionally.  Past Medical History:  Diagnosis Date  . Allergy    grass etc.  . Barrett's esophagus   . BPH (benign prostatic hyperplasia)   . Chest pain, atypical   . Chronic kidney disease    kidney stones  . Coronary artery disease   . GERD (gastroesophageal reflux disease)    Barrett's  . Hernia   . Hyperlipidemia   . Hypertension   . Myocardial infarction ([place])   . Thrombocytopenia ([place])   . Ulcer 1960   Past Surgical History:  Procedure Laterality Date  . CARDIAC CATHETERIZATION  02/14/2007   MITRAL REGURGITATION. LV NORMAL  . CHOLECYSTECTOMY    . COLONOSCOPY    . CORONARY ARTERY BYPASS GRAFT     twice  2 by passes each time  . INGUINAL HERNIA REPAIR     bilateral done twice  . LITHOTRIPSY    . UPPER GASTROINTESTINAL ENDOSCOPY     Current Outpatient Medications on File Prior to Visit  Medication Sig Dispense Refill  . aspirin 81 MG tablet Take 81 mg  by mouth daily.    . Cholecalciferol (VITAMIN D) 2000 units CAPS Take 1 capsule by mouth daily.    . finasteride (PROSCAR) 5 MG tablet TAKE 1 TABLET(5 MG) BY MOUTH DAILY. 90 tablet 3  . lansoprazole (PREVACID) 30 MG capsule Take 1 capsule (30 mg total) by mouth daily. 90 capsule 3  . metoprolol succinate (TOPROL-XL) 25 MG 24 hr tablet Take 0.5 tablets (12.5 mg total) by mouth daily. 45 tablet 3  . Multiple Vitamin (MULTIVITAMIN) tablet Take 1 tablet by mouth daily.    . nitroGLYCERIN (NITROSTAT) 0.4 MG SL tablet Place 1 tablet (0.4 mg total) under the tongue every 5 (five) minutes as needed. 75 tablet 3  . Omega-3 Fatty Acids (FISH OIL) 1000 MG CPDR Take 1,000 mg by mouth every morning.    . simvastatin (ZOCOR) 10 MG tablet TAKE 1 TABLET(10 MG) BY MOUTH AT BEDTIME 90 tablet 3  . tamsulosin (FLOMAX) 0.4 MG CAPS capsule TAKE 1 CAPSULE (0.4 MG TOTAL) BY MOUTH DAILY. 90 capsule 1   Current Facility-Administered Medications on File Prior to Visit  Medication Dose Route Frequency Provider Last Rate Last Admin  . 0.9 %  sodium chloride infusion  500 mL Intravenous Once Armbruster,  Carlota Raspberry, MD       Allergies  Allergen Reactions  . Imdur [Isosorbide Mononitrate]     Unknown, per pt   . Meloxicam Swelling    Facial swelling    Social History   Socioeconomic History  . Marital status: Married    Spouse name: Not on file  . Number of children: 2  . Years of education: Not on file  . Highest education level: Not on file  Occupational History  . Occupation: Retired  Tobacco Use  . Smoking status: Never Smoker  . Smokeless tobacco: Never Used  Vaping Use  . Vaping Use: Never used  Substance and Sexual Activity  . Alcohol use: No  . Drug use: No  . Sexual activity: Not Currently  Other Topics Concern  . Not on file  Social History Narrative  . Not on file   Social Determinants of Health   Financial Resource Strain: Low Risk   . Difficulty of Paying Living Expenses: Not hard at  all  Food Insecurity: No Food Insecurity  . Worried About Charity fundraiser in the Last Year: Never true  . Ran Out of Food in the Last Year: Never true  Transportation Needs: No Transportation Needs  . Lack of Transportation (Medical): No  . Lack of Transportation (Non-Medical): No  Physical Activity: Inactive  . Days of Exercise per Week: 0 days  . Minutes of Exercise per Session: 0 min  Stress: No Stress Concern Present  . Feeling of Stress : Not at all  Social Connections: Moderately Integrated  . Frequency of Communication with Friends and Family: More than three times a week  . Frequency of Social Gatherings with Friends and Family: Three times a week  . Attends Religious Services: 1 to 4 times per year  . Active Member of Clubs or Organizations: No  . Attends Archivist Meetings: Never  . Marital Status: Married  Human resources officer Violence: Not At Risk  . Fear of Current or Ex-Partner: No  . Emotionally Abused: No  . Physically Abused: No  . Sexually Abused: No      Review of Systems  All other systems reviewed and are negative.      Objective:   Physical Exam Vitals reviewed.  Constitutional:      General: He is not in acute distress.    Appearance: Normal appearance. He is normal weight. He is not ill-appearing or toxic-appearing.  HENT:     Head: Normocephalic and atraumatic.     Right Ear: Tympanic membrane, ear canal and external ear normal. There is no impacted cerumen.     Left Ear: Tympanic membrane, ear canal and external ear normal. There is no impacted cerumen.     Nose: Nose normal. No congestion or rhinorrhea.     Mouth/Throat:     Mouth: Mucous membranes are moist.     Pharynx: Oropharynx is clear. No oropharyngeal exudate or posterior oropharyngeal erythema.  Eyes:     Extraocular Movements: Extraocular movements intact.     Conjunctiva/sclera: Conjunctivae normal.     Pupils: Pupils are equal, round, and reactive to light.  Neck:      Vascular: No carotid bruit.  Cardiovascular:     Rate and Rhythm: Normal rate and regular rhythm.     Heart sounds: Normal heart sounds. No murmur heard. No friction rub. No gallop.   Pulmonary:     Effort: Pulmonary effort is normal. No respiratory distress.     Breath  sounds: Normal breath sounds. No stridor. No wheezing, rhonchi or rales.  Chest:     Chest wall: No tenderness.  Abdominal:     General: Bowel sounds are normal. There is no distension.     Palpations: Abdomen is soft. There is no mass.     Tenderness: There is no abdominal tenderness. There is no guarding or rebound.     Hernia: No hernia is present.  Musculoskeletal:     Cervical back: Normal range of motion and neck supple.     Thoracic back: Normal.     Lumbar back: No swelling, deformity, lacerations, spasms, tenderness or bony tenderness. Decreased range of motion. Negative right straight leg raise test and negative left straight leg raise test.       Back:     Right lower leg: No edema.     Left lower leg: No edema.  Lymphadenopathy:     Cervical: No cervical adenopathy.  Skin:    General: Skin is warm.     Coloration: Skin is not jaundiced or pale.     Findings: No bruising, erythema, lesion or rash.  Neurological:     General: No focal deficit present.     Mental Status: He is alert and oriented to person, place, and time. Mental status is at baseline.     Cranial Nerves: No cranial nerve deficit.     Sensory: No sensory deficit.     Motor: No weakness.     Coordination: Coordination normal.     Gait: Gait normal.     Deep Tendon Reflexes: Reflexes normal.  Psychiatric:        Mood and Affect: Mood normal.        Behavior: Behavior normal.        Thought Content: Thought content normal.        Judgment: Judgment normal.           Assessment & Plan:  Acute left-sided low back pain without sciatica - Plan: DG Lumbar Spine Complete  Benign essential HTN  I think this is most likely  muscular.  Therefore I would recommend physical therapy particular given his recent thrombocytopenia.  However he can also use Tylenol 2-3 times a day as needed.  Obtain an x-ray of the lumbar spine just to evaluate more thoroughly but his exam today is unremarkable.  His blood pressure is elevated above his goal.  Therefore I would add amlodipine 5 mg a day to his metoprolol.

## 2021-01-13 ENCOUNTER — Other Ambulatory Visit: Payer: Self-pay

## 2021-01-13 ENCOUNTER — Ambulatory Visit
Admission: RE | Admit: 2021-01-13 | Discharge: 2021-01-13 | Disposition: A | Discharge status: Home / Self Care | Payer: Medicare HMO | Source: Ambulatory Visit | Attending: Family Medicine | Admitting: Family Medicine

## 2021-01-13 DIAGNOSIS — M545 Low back pain, unspecified: Secondary | ICD-10-CM

## 2021-01-30 NOTE — Progress Notes (Signed)
Chronic Care Management Pharmacy Note  02/06/2021 Name:  Andrew [name] MRN:  [id number] DOB:  [birth year]  Subjective: Andrew [name] is an 80 y.o. year old male who is a primary patient of Pickard, Cammie Mcgee, MD.  The CCM team was consulted for assistance with disease management and care coordination needs.    Engaged with patient by telephone for follow up visit in response to provider referral for pharmacy case management and/or care coordination services.   Consent to Services:  The patient was given the following information about Chronic Care Management services today, agreed to services, and gave verbal consent: 1. CCM service includes personalized support from designated clinical staff supervised by the primary care provider, including individualized plan of care and coordination with other care providers 2. 24/7 contact phone numbers for assistance for urgent and routine care needs. 3. Service will only be billed when office clinical staff spend 20 minutes or more in a month to coordinate care. 4. Only one practitioner may furnish and bill the service in a calendar month. 5.The patient may stop CCM services at any time (effective at the end of the month) by phone call to the office staff. 6. The patient will be responsible for cost sharing (co-pay) of up to 20% of the service fee (after annual deductible is met). Patient agreed to services and consent obtained.  Patient Care Team: Susy Frizzle, MD as PCP - General (Family Medicine) Nahser, Wonda Cheng, MD as PCP - Cardiology (Cardiology) Edythe Clarity, [facility] as Pharmacist (Pharmacist)  Recent office visits: 01/10/21 Dennard Schaumann) - L sided lower back pain.  BP was elevated and amlodipine 76m was added to medication.  12/12/20 (Pickard) - BP was elevated patient is to record readings and report back in one week.   Recent consult visits: None since last CCM contact  Hospital visits: None in previous 6 months  Objective:  Lab  Results  Component Value Date   CREATININE 0.86 12/09/2020   BUN 17 12/09/2020   GFR 91.76 07/08/2015   GFRNONAA 81 12/09/2020   GFRAA 94 12/09/2020   NA 139 12/09/2020   K 4.7 12/09/2020   CALCIUM 8.7 12/09/2020   CO2 28 12/09/2020   GLUCOSE 171 (H) 12/09/2020    Lab Results  Component Value Date/Time   HGBA1C 7.0 (H) 12/09/2020 08:45 AM   HGBA1C 6.8 (H) 08/27/2020 08:59 AM   GFR 91.76 07/08/2015 07:39 AM   GFR 86.83 11/11/2011 08:49 AM   MICROALBUR 0.9 08/27/2020 08:59 AM   MICROALBUR 0.6 02/15/2020 08:45 AM    Last diabetic Eye exam: No results found for: HMDIABEYEEXA  Last diabetic Foot exam: No results found for: HMDIABFOOTEX   Lab Results  Component Value Date   CHOL 110 12/09/2020   HDL 36 (L) 12/09/2020   LDLCALC 57 12/09/2020   TRIG 91 12/09/2020   CHOLHDL 3.1 12/09/2020    Hepatic Function Latest Ref Rng & Units 12/09/2020 08/27/2020 05/31/2020  Total Protein 6.1 - 8.1 g/dL 5.9(L) 6.0(L) 6.1  Albumin 3.6 - 4.6 g/dL - - 4.3  AST 10 - 35 U/L _0 ALT 9 - 46 U/L _1 Alk Phosphatase 48 - 121 IU/L - - 71  Total Bilirubin 0.2 - 1.2 mg/dL 0.6 0.6 0.5  Bilirubin, Direct 0.00 - 0.40 mg/dL - - 0.15    Lab Results  Component Value Date/Time   TSH 2.14 07/15/2018 08:48 AM   TSH 2.323 09/16/2015 08:32 AM  CBC Latest Ref Rng & Units 12/09/2020 09/10/2020 08/27/2020  WBC 3.8 - 10.8 Thousand/uL 7.4 7.8 6.1  Hemoglobin 13.2 - 17.1 g/dL 14.3 14.2 14.2  Hematocrit 38.5 - 50.0 % 42.1 41.6 41.6  Platelets 140 - 400 Thousand/uL 68(L) 155 86(L)    Lab Results  Component Value Date/Time   VD25OH 52.05 02/29/2020 03:54 PM    Clinical ASCVD: No  The ASCVD Risk score [name] Bussing DC Jr., et al., 2013) failed to calculate for the following reasons:   The 2013 ASCVD risk score is only valid for ages 79 to 84   The patient has a prior MI or stroke diagnosis    Depression screen [facility] 2/9 08/27/2020 02/15/2020 12/31/2017  Decreased Interest 0 0 0  Down, Depressed,  Hopeless 0 0 0  PHQ - 2 Score 0 0 0     Social History   Tobacco Use  Smoking Status Never Smoker  Smokeless Tobacco Never Used   BP Readings from Last 3 Encounters:  01/31/21 (!) 102/58  01/10/21 (!) 128/50  12/12/20 (!) 150/70   Pulse Readings from Last 3 Encounters:  01/31/21 80  01/10/21 75  12/12/20 68   Wt Readings from Last 3 Encounters:  01/31/21 161 lb (73 kg)  01/10/21 161 lb 3.2 oz (73.1 kg)  12/12/20 161 lb (73 kg)   BMI Readings from Last 3 Encounters:  01/31/21 23.78 kg/m  01/10/21 23.81 kg/m  12/12/20 23.78 kg/m    Assessment/Interventions: Review of patient past medical history, allergies, medications, health status, including review of consultants reports, laboratory and other test data, was performed as part of comprehensive evaluation and provision of chronic care management services.   SDOH:  (Social Determinants of Health) assessments and interventions performed: Yes   Financial Resource Strain: Low Risk   . Difficulty of Paying Living Expenses: Not hard at all    SDOH Screenings   Alcohol Screen: Low Risk   . Last Alcohol Screening Score (AUDIT): 0  Depression (PHQ2-9): Low Risk   . PHQ-2 Score: 0  Financial Resource Strain: Low Risk   . Difficulty of Paying Living Expenses: Not hard at all  Food Insecurity: No Food Insecurity  . Worried About Charity fundraiser in the Last Year: Never true  . Ran Out of Food in the Last Year: Never true  Housing: Low Risk   . Last Housing Risk Score: 0  Physical Activity: Inactive  . Days of Exercise per Week: 0 days  . Minutes of Exercise per Session: 0 min  Social Connections: Moderately Integrated  . Frequency of Communication with Friends and Family: More than three times a week  . Frequency of Social Gatherings with Friends and Family: Three times a week  . Attends Religious Services: 1 to 4 times per year  . Active Member of Clubs or Organizations: No  . Attends Archivist  Meetings: Never  . Marital Status: Married  Stress: No Stress Concern Present  . Feeling of Stress : Not at all  Tobacco Use: Low Risk   . Smoking Tobacco Use: Never Smoker  . Smokeless Tobacco Use: Never Used  Transportation Needs: No Transportation Needs  . Lack of Transportation (Medical): No  . Lack of Transportation (Non-Medical): No    CCM Care Plan  Allergies  Allergen Reactions  . Imdur [Isosorbide Mononitrate]     Unknown, per pt   . Meloxicam Swelling    Facial swelling     Medications Reviewed Today    Reviewed  by Six, Eden Lathe, LPN (Licensed Practical Nurse) on 01/31/21 at 1214  Med List Status: <None>  Medication Order Taking? Sig Documenting Provider Last Dose Status Informant  0.9 %  sodium chloride infusion [id number]   Armbruster, Carlota Raspberry, MD  Active   amLODipine (NORVASC) 5 MG tablet [id number]  Take 1 tablet (5 mg total) by mouth daily. Susy Frizzle, MD  Active   aspirin 81 MG tablet [id number]  Take 81 mg by mouth daily. [provider]  Active Spouse/Significant Other  Cholecalciferol (VITAMIN D) 2000 units CAPS [id number]  Take 1 capsule by mouth daily. [provider]  Active Spouse/Significant Other  finasteride (PROSCAR) 5 MG tablet [id number]  TAKE 1 TABLET(5 MG) BY MOUTH DAILY. Susy Frizzle, MD  Active   lansoprazole (PREVACID) 30 MG capsule [id number]  Take 1 capsule (30 mg total) by mouth daily. Susy Frizzle, MD  Active   metoprolol succinate (TOPROL-XL) 25 MG 24 hr tablet [id number]  Take 0.5 tablets (12.5 mg total) by mouth daily. Susy Frizzle, MD  Expired 01/16/21 2359   Multiple Vitamin (MULTIVITAMIN) tablet [id number]  Take 1 tablet by mouth daily. [provider]  Active Spouse/Significant Other  nitroGLYCERIN (NITROSTAT) 0.4 MG SL tablet [id number]  Place 1 tablet (0.4 mg total) under the tongue every 5 (five) minutes as needed. Susy Frizzle, MD  Active Spouse/Significant Other  Omega-3 Fatty Acids  (FISH OIL) 1000 MG CPDR [id number]  Take 1,000 mg by mouth every morning. [provider]  Active Spouse/Significant Other  simvastatin (ZOCOR) 10 MG tablet [id number]  TAKE 1 TABLET(10 MG) BY MOUTH AT BEDTIME Susy Frizzle, MD  Active   tamsulosin (FLOMAX) 0.4 MG CAPS capsule [id number]  TAKE 1 CAPSULE (0.4 MG TOTAL) BY MOUTH DAILY. Alycia Rossetti, MD  Active           Patient Active Problem List   Diagnosis Date Noted  . Thrombocytopenia ([place])   . Bilateral carotid artery disease ([place]) 08/04/2016  . Poor posture 02/22/2014  . Stiffness of joints, not elsewhere classified, multiple sites 02/22/2014  . Prediabetes 01/05/2013  . Special screening for malignant neoplasms, colon 04/27/2011  . Benign neoplasm of colon 04/27/2011  . GERD (gastroesophageal reflux disease) 04/27/2011  . Diverticulosis of colon (without mention of hemorrhage) 04/27/2011  . CAD (coronary artery disease) 12/27/2010  . Environmental allergies 12/27/2010  . Insomnia 12/27/2010  . H. pylori infection 12/27/2010  . Barrett's esophagus 12/27/2010  . Hyperlipidemia 11/08/2008  . HTN (hypertension) 11/08/2008  . GERD 11/08/2008  . ACUT PEPTC ULCR UNS SITE W/HEM W/O MENTION OBST 11/08/2008  . RENAL CALCULUS 11/08/2008  . Obstructive sleep apnea 04/12/2003    Immunization History  Administered Date(s) Administered  . Fluad Quad(high Dose 65+) 06/30/2019, 06/18/2020  . Influenza Split 07/07/2012  . Influenza Whole 07/18/2010  . Influenza, High Dose Seasonal PF 08/16/2017, 09/07/2018  . Influenza,inj,Quad PF,6+ Mos 07/24/2013, 09/10/2014, 08/30/2015, 08/21/2016  . Moderna Sars-Covid-2 Vaccination 12/20/2019, 01/17/2020, 10/18/2020  . Pneumococcal Conjugate-13 09/10/2014  . Pneumococcal Polysaccharide-23 08/16/2007  . Td 07/30/2003  . Zoster 06/11/2006    Conditions to be addressed/monitored:  HTN, CAD, Barrett's Esophagus, HLD, Pre-DM.   Care Plan : General Pharmacy (Adult)  Updates made by  Edythe Clarity, RPH since 02/06/2021 12:00 AM    Problem: HTN, CAD, Barrett's Esophagus, HLD, Pre-DM   Priority: High  Onset Date: 02/06/2021    Long-Range Goal: Patient-Specific Goal   Start Date: 02/06/2021  Expected End Date: 08/08/2021  This Visit's Progress: On track  Priority: High  Note:   Current Barriers:  . Unable to independently monitor therapeutic efficacy . Unable to achieve control of BP.   Pharmacist Clinical Goal(s):  [name] Patient will achieve adherence to monitoring guidelines and medication adherence to achieve therapeutic efficacy . achieve control of BP as evidenced by home monitoring . adhere to prescribed medication regimen as evidenced by fill date/pill box . contact provider office for questions/concerns as evidenced notation of same in electronic health record through collaboration with PharmD and provider.   Interventions: . 1:1 collaboration with Susy Frizzle, MD regarding development and update of comprehensive plan of care as evidenced by provider attestation and co-signature . Inter-disciplinary care team collaboration (see longitudinal plan of care) . Comprehensive medication review performed; medication list updated in electronic medical record  Hypertension (BP goal <140/90) -Controlled -Current treatment: . Amlodipine 66m daily . Metoprolol XL 287mdaily -Medications previously tried: none noted  -Current home readings: none, patient not checking at home -Current dietary habits: he eats a diabetic conscious diet, watches carbs and sweets -Current exercise habits: active outside with yardwork/gardening -Reports hypotensive/hypertensive symptoms - patient reports feeling very tired often -Educated on BP goals and benefits of medications for prevention of heart attack, stroke and kidney damage; Daily salt intake goal < 2300 mg; Importance of home blood pressure monitoring;  -He denies any swelling -Counseled to monitor BP at home 2 to 3  times per week, document, and provide log at future appointments -Recommended to continue current medication Recommended he monitor BP a few times per week, follow up plan initiated to follow up on readings.  Hyperlipidemia: (LDL goal < 100) -Controlled -Current treatment: . Simvastatin 1066mMedications previously tried: none noted  -Current dietary patterns: see above -Current exercise habits: see above -Educated on Cholesterol goals;  Benefits of statin for ASCVD risk reduction; Importance of limiting foods high in cholesterol; -Recommended to continue current medication  Pre-Diabetes (A1c goal <7%) -Controlled -Current medications: . none -Medications previously tried: none noted -Current home glucose readings . fasting glucose: N/A not checking currently . post prandial glucose: N/A not checking currently -Denies hypoglycemic/hyperglycemic symptoms -Current meal patterns: "careful with what he eats", watched carbohydrates and sweets.  Does report he eats a lot of peanut butter.  Currently uses JIF low fat which has 15g of carbs per serving.  Recommended he switch to JIF natural which has about 8g of carbs per serving.  Patient agreeable to plan. -Current exercise: outside working around house -Educated on A1c and blood sugar goals; Complications of diabetes including kidney damage, retinal damage, and cardiovascular disease; Prevention and management of hypoglycemic episodes; Benefits of routine self-monitoring of blood sugar; Continuous glucose monitoring; Carbohydrate content of his PB -Counseled to check feet daily and get yearly eye exams -Recommended to continue current medication Recommended dietary changes to cut back on carbs, follow up A1c in early June.   Patient Goals/Self-Care Activities . Patient will:  - take medications as prescribed focus on medication adherence by pill count check blood pressure a few times per weekl, document, and provide at future  appointments engage in dietary modifications by limiting consumption of carbohydrates  Follow Up Plan: The care management team will reach out to the patient again over the next 90 days.        Medication Assistance: None required.  Patient affirms current coverage meets needs.  Patient's preferred pharmacy is:  [phone] [place]  [place] [facility] [address] [place] [address] Phone: [phone] Fax: [phone]  [facility] [address], [place] [address] Phone: [phone] Fax: [phone]  Uses pill box? Yes Pt endorses 100% compliance  We discussed: Benefits of medication synchronization, packaging and delivery as well as enhanced pharmacist oversight with [facility]. Patient decided to: Continue current medication management strategy  Care Plan and Follow Up Patient Decision:  Patient agrees to Care Plan and Follow-up.  Plan: The care management team will reach out to the patient again over the next 90 days.  Beverly Milch, PharmD Clinical Pharmacist [place] [phone]

## 2021-01-31 ENCOUNTER — Other Ambulatory Visit: Payer: Self-pay

## 2021-01-31 ENCOUNTER — Ambulatory Visit (INDEPENDENT_AMBULATORY_CARE_PROVIDER_SITE_OTHER): Payer: Medicare HMO | Admitting: Family Medicine

## 2021-01-31 ENCOUNTER — Other Ambulatory Visit: Payer: Self-pay | Admitting: Family Medicine

## 2021-01-31 ENCOUNTER — Encounter: Payer: Self-pay | Admitting: Family Medicine

## 2021-01-31 VITALS — BP 102/58 | HR 80 | Temp 97.9°F | Resp 14 | Ht 69.0 in | Wt 161.0 lb

## 2021-01-31 DIAGNOSIS — M5136 Other intervertebral disc degeneration, lumbar region: Secondary | ICD-10-CM | POA: Diagnosis not present

## 2021-01-31 MED ORDER — SILVER SULFADIAZINE 1 % EX CREA: 1.0000 "application " | TOPICAL_CREAM | Freq: Every day | CUTANEOUS | 0 refills | Status: DC

## 2021-01-31 NOTE — Progress Notes (Signed)
Subjective:    Patient ID: Andrew [name], male    DOB: Aug 29, 1941, 80 y.o.   MRN: [id number]  01/10/21 Patient presents today with left-sided low back pain.  He states that when he sits at church for a prolonged period of time, as soon as he stands up, he has a difficult time standing up straight.  He reports sharp pain in his lower back just above the left gluteus muscle.  After he walks for a while, the muscle will limber up and the pain will start to improve.  As long as he is working around the home, his back feels okay.  However when he gets out of bed in the morning, his back will frequently feel extremely stiff and painful in the exact same area as the one circled on the diagram below.  He denies any dysuria or urgency or frequency or hematuria.  He denies any falls or injuries.  He denies any numbness or tingling in his legs.  He denies any paresthesias in his legs.  He denies any weakness in his legs.  Straight leg raise today is negative bilaterally.  His blood pressure at home has been averaging between [id number] and [id number] systolic occasionally.  AT that time, my plan was: I think this is most likely muscular.  Therefore I would recommend physical therapy particular given his recent thrombocytopenia.  However he can also use Tylenol 2-3 times a day as needed.  Obtain an x-ray of the lumbar spine just to evaluate more thoroughly but his exam today is unremarkable.  His blood pressure is elevated above his goal.  Therefore I would add amlodipine 5 mg a day to his metoprolol.  01/31/21  IMPRESSION: 1. Degenerative disc disease throughout the lumbar spine, most prominent at L2-L3, with 6 mm retrolisthesis of L2 on L3. Degenerative disc disease is similar to 2015. 2. Facet hypertrophy at L2-L3, L3-L4 and L4-L5. This is progressed from prior exam. X-ray report as above.  Patient continues to have low back pain particular on the left-hand side however he denies any bowel or bladder incontinence or lumbar  radiculopathy.  He is taking 1 Tylenol at night.  Occasionally he will take an extra Tylenol during the day.  Past Medical History:  Diagnosis Date  . Allergy    grass etc.  . Barrett's esophagus   . BPH (benign prostatic hyperplasia)   . Chest pain, atypical   . Chronic kidney disease    kidney stones  . Coronary artery disease   . GERD (gastroesophageal reflux disease)    Barrett's  . Hernia   . Hyperlipidemia   . Hypertension   . Myocardial infarction ([place])   . Thrombocytopenia ([place])   . Ulcer 1960   Past Surgical History:  Procedure Laterality Date  . CARDIAC CATHETERIZATION  02/14/2007   MITRAL REGURGITATION. LV NORMAL  . CHOLECYSTECTOMY    . COLONOSCOPY    . CORONARY ARTERY BYPASS GRAFT     twice  2 by passes each time  . INGUINAL HERNIA REPAIR     bilateral done twice  . LITHOTRIPSY    . UPPER GASTROINTESTINAL ENDOSCOPY     Current Outpatient Medications on File Prior to Visit  Medication Sig Dispense Refill  . amLODipine (NORVASC) 5 MG tablet Take 1 tablet (5 mg total) by mouth daily. 90 tablet 3  . aspirin 81 MG tablet Take 81 mg by mouth daily.    . Cholecalciferol (VITAMIN D) 2000 units CAPS Take 1 capsule by  mouth daily.    . finasteride (PROSCAR) 5 MG tablet TAKE 1 TABLET(5 MG) BY MOUTH DAILY. 90 tablet 3  . lansoprazole (PREVACID) 30 MG capsule Take 1 capsule (30 mg total) by mouth daily. 90 capsule 3  . metoprolol succinate (TOPROL-XL) 25 MG 24 hr tablet Take 0.5 tablets (12.5 mg total) by mouth daily. 45 tablet 3  . Multiple Vitamin (MULTIVITAMIN) tablet Take 1 tablet by mouth daily.    . nitroGLYCERIN (NITROSTAT) 0.4 MG SL tablet Place 1 tablet (0.4 mg total) under the tongue every 5 (five) minutes as needed. 75 tablet 3  . Omega-3 Fatty Acids (FISH OIL) 1000 MG CPDR Take 1,000 mg by mouth every morning.    . simvastatin (ZOCOR) 10 MG tablet TAKE 1 TABLET(10 MG) BY MOUTH AT BEDTIME 90 tablet 3  . tamsulosin (FLOMAX) 0.4 MG CAPS capsule TAKE 1 CAPSULE (0.4  MG TOTAL) BY MOUTH DAILY. 90 capsule 1   Current Facility-Administered Medications on File Prior to Visit  Medication Dose Route Frequency Provider Last Rate Last Admin  . 0.9 %  sodium chloride infusion  500 mL Intravenous Once Armbruster, Carlota Raspberry, MD       Allergies  Allergen Reactions  . Imdur [Isosorbide Mononitrate]     Unknown, per pt   . Meloxicam Swelling    Facial swelling    Social History   Socioeconomic History  . Marital status: Married    Spouse name: Not on file  . Number of children: 2  . Years of education: Not on file  . Highest education level: Not on file  Occupational History  . Occupation: Retired  Tobacco Use  . Smoking status: Never Smoker  . Smokeless tobacco: Never Used  Vaping Use  . Vaping Use: Never used  Substance and Sexual Activity  . Alcohol use: No  . Drug use: No  . Sexual activity: Not Currently  Other Topics Concern  . Not on file  Social History Narrative  . Not on file   Social Determinants of Health   Financial Resource Strain: Low Risk   . Difficulty of Paying Living Expenses: Not hard at all  Food Insecurity: No Food Insecurity  . Worried About Charity fundraiser in the Last Year: Never true  . Ran Out of Food in the Last Year: Never true  Transportation Needs: No Transportation Needs  . Lack of Transportation (Medical): No  . Lack of Transportation (Non-Medical): No  Physical Activity: Inactive  . Days of Exercise per Week: 0 days  . Minutes of Exercise per Session: 0 min  Stress: No Stress Concern Present  . Feeling of Stress : Not at all  Social Connections: Moderately Integrated  . Frequency of Communication with Friends and Family: More than three times a week  . Frequency of Social Gatherings with Friends and Family: Three times a week  . Attends Religious Services: 1 to 4 times per year  . Active Member of Clubs or Organizations: No  . Attends Archivist Meetings: Never  . Marital Status: Married   Human resources officer Violence: Not At Risk  . Fear of Current or Ex-Partner: No  . Emotionally Abused: No  . Physically Abused: No  . Sexually Abused: No      Review of Systems  All other systems reviewed and are negative.      Objective:   Physical Exam Vitals reviewed.  Constitutional:      General: He is not in acute distress.  Appearance: Normal appearance. He is normal weight. He is not ill-appearing or toxic-appearing.  HENT:     Head: Normocephalic and atraumatic.     Right Ear: Tympanic membrane, ear canal and external ear normal. There is no impacted cerumen.     Left Ear: Tympanic membrane, ear canal and external ear normal. There is no impacted cerumen.     Nose: Nose normal. No congestion or rhinorrhea.     Mouth/Throat:     Mouth: Mucous membranes are moist.     Pharynx: Oropharynx is clear. No oropharyngeal exudate or posterior oropharyngeal erythema.  Eyes:     Extraocular Movements: Extraocular movements intact.     Conjunctiva/sclera: Conjunctivae normal.     Pupils: Pupils are equal, round, and reactive to light.  Neck:     Vascular: No carotid bruit.  Cardiovascular:     Rate and Rhythm: Normal rate and regular rhythm.     Heart sounds: Normal heart sounds. No murmur heard. No friction rub. No gallop.   Pulmonary:     Effort: Pulmonary effort is normal. No respiratory distress.     Breath sounds: Normal breath sounds. No stridor. No wheezing, rhonchi or rales.  Chest:     Chest wall: No tenderness.  Abdominal:     General: Bowel sounds are normal. There is no distension.     Palpations: Abdomen is soft. There is no mass.     Tenderness: There is no abdominal tenderness. There is no guarding or rebound.     Hernia: No hernia is present.  Musculoskeletal:     Cervical back: Normal range of motion and neck supple.     Thoracic back: Normal.     Lumbar back: No swelling, deformity, lacerations, spasms, tenderness or bony tenderness. Decreased range of  motion. Negative right straight leg raise test and negative left straight leg raise test.       Back:     Right lower leg: No edema.     Left lower leg: No edema.  Lymphadenopathy:     Cervical: No cervical adenopathy.  Skin:    General: Skin is warm.     Coloration: Skin is not jaundiced or pale.     Findings: No bruising, erythema, lesion or rash.  Neurological:     General: No focal deficit present.     Mental Status: He is alert and oriented to person, place, and time. Mental status is at baseline.     Cranial Nerves: No cranial nerve deficit.     Sensory: No sensory deficit.     Motor: No weakness.     Coordination: Coordination normal.     Gait: Gait normal.     Deep Tendon Reflexes: Reflexes normal.  Psychiatric:        Mood and Affect: Mood normal.        Behavior: Behavior normal.        Thought Content: Thought content normal.        Judgment: Judgment normal.           Assessment & Plan:  DDD (degenerative disc disease), lumbar  I reviewed the results of the x-ray with the patient.  At the present time the pain is mild to moderate.  We will try to treat this symptomatically with Tylenol.  If the pain worsens I will be glad to set him up to see orthopedics to see if he would benefit from a cortisone injection in the spine however at the present time it seems  like the Tylenol is managing his pain.

## 2021-02-04 ENCOUNTER — Telehealth: Payer: Self-pay

## 2021-02-05 ENCOUNTER — Other Ambulatory Visit: Payer: Self-pay | Admitting: Family Medicine

## 2021-02-06 ENCOUNTER — Ambulatory Visit (INDEPENDENT_AMBULATORY_CARE_PROVIDER_SITE_OTHER): Payer: Medicare HMO | Admitting: Pharmacist

## 2021-02-06 DIAGNOSIS — E785 Hyperlipidemia, unspecified: Secondary | ICD-10-CM | POA: Diagnosis not present

## 2021-02-06 DIAGNOSIS — E118 Type 2 diabetes mellitus with unspecified complications: Secondary | ICD-10-CM | POA: Diagnosis not present

## 2021-02-06 DIAGNOSIS — I10 Essential (primary) hypertension: Secondary | ICD-10-CM | POA: Diagnosis not present

## 2021-02-06 NOTE — Patient Instructions (Addendum)
Visit Information  Goals Addressed            This Visit's Progress   . Track and Manage My Blood Pressure-Hypertension       Timeframe:  Long-Range Goal Priority:  High Start Date:      02/06/21                       Expected End Date:   08/08/21                    Follow Up Date 05/08/21   - check blood pressure 3 times per week - choose a place to take my blood pressure (home, clinic or office, retail store) - write blood pressure results in a log or diary    Why is this important?    You won't feel high blood pressure, but it can still hurt your blood vessels.   High blood pressure can cause heart or kidney problems. It can also cause a stroke.   Making lifestyle changes like losing a little weight or eating less salt will help.   Checking your blood pressure at home and at different times of the day can help to control blood pressure.   If the doctor prescribes medicine remember to take it the way the doctor ordered.   Call the office if you cannot afford the medicine or if there are questions about it.     Notes:       Patient Care Plan: General Pharmacy (Adult)    Problem Identified: HTN, CAD, Barrett's Esophagus, HLD, Pre-DM   Priority: High  Onset Date: 02/06/2021    Long-Range Goal: Patient-Specific Goal   Start Date: 02/06/2021  Expected End Date: 08/08/2021  This Visit's Progress: On track  Priority: High  Note:   Current Barriers:  . Unable to independently monitor therapeutic efficacy . Unable to achieve control of BP.   Pharmacist Clinical Goal(s):  [name] Patient will achieve adherence to monitoring guidelines and medication adherence to achieve therapeutic efficacy . achieve control of BP as evidenced by home monitoring . adhere to prescribed medication regimen as evidenced by fill date/pill box . contact provider office for questions/concerns as evidenced notation of same in electronic health record through collaboration with PharmD and provider.    Interventions: . 1:1 collaboration with Susy Frizzle, MD regarding development and update of comprehensive plan of care as evidenced by provider attestation and co-signature . Inter-disciplinary care team collaboration (see longitudinal plan of care) . Comprehensive medication review performed; medication list updated in electronic medical record  Hypertension (BP goal <140/90) -Controlled -Current treatment: . Amlodipine 5mg  daily . Metoprolol XL 25mg  daily -Medications previously tried: none noted  -Current home readings: none, patient not checking at home -Current dietary habits: he eats a diabetic conscious diet, watches carbs and sweets -Current exercise habits: active outside with yardwork/gardening -Reports hypotensive/hypertensive symptoms - patient reports feeling very tired often -Educated on BP goals and benefits of medications for prevention of heart attack, stroke and kidney damage; Daily salt intake goal < 2300 mg; Importance of home blood pressure monitoring;  -He denies any swelling -Counseled to monitor BP at home 2 to 3 times per week, document, and provide log at future appointments -Recommended to continue current medication Recommended he monitor BP a few times per week, follow up plan initiated to follow up on readings.  Hyperlipidemia: (LDL goal < 100) -Controlled -Current treatment: . Simvastatin 10mg  -Medications previously tried: none noted  -  Current dietary patterns: see above -Current exercise habits: see above -Educated on Cholesterol goals;  Benefits of statin for ASCVD risk reduction; Importance of limiting foods high in cholesterol; -Recommended to continue current medication  Pre-Diabetes (A1c goal <7%) -Controlled -Current medications: . none -Medications previously tried: none noted -Current home glucose readings . fasting glucose: N/A not checking currently . post prandial glucose: N/A not checking currently -Denies  hypoglycemic/hyperglycemic symptoms -Current meal patterns: "careful with what he eats", watched carbohydrates and sweets.  Does report he eats a lot of peanut butter.  Currently uses JIF low fat which has 15g of carbs per serving.  Recommended he switch to JIF natural which has about 8g of carbs per serving.  Patient agreeable to plan. -Current exercise: outside working around house -Educated on A1c and blood sugar goals; Complications of diabetes including kidney damage, retinal damage, and cardiovascular disease; Prevention and management of hypoglycemic episodes; Benefits of routine self-monitoring of blood sugar; Continuous glucose monitoring; Carbohydrate content of his PB -Counseled to check feet daily and get yearly eye exams -Recommended to continue current medication Recommended dietary changes to cut back on carbs, follow up A1c in early June.   Patient Goals/Self-Care Activities . Patient will:  - take medications as prescribed focus on medication adherence by pill count check blood pressure a few times per weekl, document, and provide at future appointments engage in dietary modifications by limiting consumption of carbohydrates  Follow Up Plan: The care management team will reach out to the patient again over the next 90 days.        The patient verbalized understanding of instructions, educational materials, and care plan provided today and agreed to receive a mailed copy of patient instructions, educational materials, and care plan.  Telephone follow up appointment with pharmacy team member scheduled for: 3 months  Edythe Clarity, [facility]  How to Take Your Blood Pressure Blood pressure is a measurement of how strongly your blood is pressing against the walls of your arteries. Arteries are blood vessels that carry blood from your heart throughout your body. Your health care provider takes your blood pressure at each office visit. You can also take your own blood pressure  at home with a blood pressure monitor. You may need to take your own blood pressure to:  Confirm a diagnosis of high blood pressure (hypertension).  Monitor your blood pressure over time.  Make sure your blood pressure medicine is working. Supplies needed:  Blood pressure monitor.  Dining room chair to sit in.  Table or desk.  Small notebook and pencil or pen. How to prepare To get the most accurate reading, avoid the following for 30 minutes before you check your blood pressure:  Drinking caffeine.  Drinking alcohol.  Eating.  Smoking.  Exercising. Five minutes before you check your blood pressure:  Use the bathroom and urinate so that you have an empty bladder.  Sit quietly in a dining room chair. Do not sit in a soft couch or an armchair. Do not talk. How to take your blood pressure To check your blood pressure, follow the instructions in the manual that came with your blood pressure monitor. If you have a digital blood pressure monitor, the instructions may be as follows: 1. Sit up straight in a chair. 2. Place your feet on the floor. Do not cross your ankles or legs. 3. Rest your left arm at the level of your heart on a table or desk or on the arm of a chair.  4. Pull up your shirt sleeve. 5. Wrap the blood pressure cuff around the upper part of your left arm, 1 inch (2.5 cm) above your elbow. It is best to wrap the cuff around bare skin. 6. Fit the cuff snugly around your arm. You should be able to place only one finger between the cuff and your arm. 7. Position the cord so that it rests in the bend of your elbow. 8. Press the power button. 9. Sit quietly while the cuff inflates and deflates. 10. Read the digital reading on the monitor screen and write the numbers down (record them) in a notebook. 11. Wait 2-3 minutes, then repeat the steps, starting at step 1.   What does my blood pressure reading mean? A blood pressure reading consists of a higher number over a  lower number. Ideally, your blood pressure should be below 120/80. The first ("top") number is called the systolic pressure. It is a measure of the pressure in your arteries as your heart beats. The second ("bottom") number is called the diastolic pressure. It is a measure of the pressure in your arteries as the heart relaxes. Blood pressure is classified into five stages. The following are the stages for adults who do not have a short-term serious illness or a chronic condition. Systolic pressure and diastolic pressure are measured in a unit called mm Hg (millimeters of mercury).  Normal  Systolic pressure: below 329.  Diastolic pressure: below 80. Elevated  Systolic pressure: 518-841.  Diastolic pressure: below 80. Hypertension stage 1  Systolic pressure: 660-630.  Diastolic pressure: 16-01. Hypertension stage 2  Systolic pressure: 093 or above.  Diastolic pressure: 90 or above. You can have elevated blood pressure or hypertension even if only the systolic or only the diastolic number in your reading is higher than normal. Follow these instructions at home:  Check your blood pressure as often as recommended by your health care provider.  Check your blood pressure at the same time every day.  Take your monitor to the next appointment with your health care provider to make sure that: ? You are using it correctly. ? It provides accurate readings.  Be sure you understand what your goal blood pressure numbers are.  Tell your health care provider if you are having any side effects from blood pressure medicine.  Keep all follow-up visits as told by your health care provider. This is important. General tips  Your health care provider can suggest a reliable monitor that will meet your needs. There are several types of home blood pressure monitors.  Choose a monitor that has an arm cuff. Do not choose a monitor that measures your blood pressure from your wrist or finger.  Choose a  cuff that wraps snugly around your upper arm. You should be able to fit only one finger between your arm and the cuff.  You can buy a blood pressure monitor at most drugstores or online. Where to find more information American Heart Association: www.heart.org Contact a health care provider if:  Your blood pressure is consistently high. Get help right away if:  Your systolic blood pressure is higher than 180.  Your diastolic blood pressure is higher than 120. Summary  Blood pressure is a measurement of how strongly your blood is pressing against the walls of your arteries.  A blood pressure reading consists of a higher number over a lower number. Ideally, your blood pressure should be below 120/80.  Check your blood pressure at the same time every  day.  Avoid caffeine, alcohol, smoking, and exercise for 30 minutes prior to checking your blood pressure. These agents can affect the accuracy of the blood pressure reading. This information is not intended to replace advice given to you by your health care provider. Make sure you discuss any questions you have with your health care provider. Document Revised: 09/22/2019 Document Reviewed: 09/22/2019 Elsevier Patient Education  2021 [employer].

## 2021-02-10 DIAGNOSIS — L928 Other granulomatous disorders of the skin and subcutaneous tissue: Secondary | ICD-10-CM | POA: Diagnosis not present

## 2021-02-27 DIAGNOSIS — H5200 Hypermetropia, unspecified eye: Secondary | ICD-10-CM | POA: Diagnosis not present

## 2021-02-27 DIAGNOSIS — Z0100 Encounter for examination of eyes and vision without abnormal findings: Secondary | ICD-10-CM | POA: Diagnosis not present

## 2021-03-04 ENCOUNTER — Telehealth: Payer: Self-pay | Admitting: Pharmacist

## 2021-03-04 NOTE — Progress Notes (Addendum)
Chronic Care Management Pharmacy Assistant   Name: Andrew [name]  MRN: [id number] DOB: January 20, 1941  Reason for Encounter: Disease State For HTN.    Conditions to be addressed/monitored: HTN, CAD, Barrett's Esophagus, HLD, Pre-DM  Recent office visits:  01/31/21 Dr. Dennard Schaumann For DDD. STARTED Silver sulfadiazine 1% 1 application daily.  01/10/21 Dr. Dennard Schaumann For acute left-sided low back pain. Per note: xray. STARTED amlodipine 5 mg daily. Per note: The doctor recommend physical therapy and take tylenol 2-3 times daily as needed.   12/12/20 Dr. Dennard Schaumann For follow-up. No medication changes.   Recent consult visits:  12/16/20 Dermatology [facility] Kenner No information given.  Hospital visits:  None since 10/16/20  Medications: Outpatient Encounter Medications as of 03/04/2021  Medication Sig   amLODipine (NORVASC) 5 MG tablet Take 1 tablet (5 mg total) by mouth daily.   aspirin 81 MG tablet Take 81 mg by mouth daily.   Cholecalciferol (VITAMIN D) 2000 units CAPS Take 1 capsule by mouth daily.   finasteride (PROSCAR) 5 MG tablet TAKE 1 TABLET(5 MG) BY MOUTH DAILY.   lansoprazole (PREVACID) 30 MG capsule TAKE 1 CAPSULE EVERY DAY   metoprolol succinate (TOPROL-XL) 25 MG 24 hr tablet TAKE 1/2 TABLET EVERY DAY   Multiple Vitamin (MULTIVITAMIN) tablet Take 1 tablet by mouth daily.   nitroGLYCERIN (NITROSTAT) 0.4 MG SL tablet Place 1 tablet (0.4 mg total) under the tongue every 5 (five) minutes as needed.   Omega-3 Fatty Acids (FISH OIL) 1000 MG CPDR Take 1,000 mg by mouth every morning.   silver sulfADIAZINE (SILVADENE) 1 % cream Apply 1 application topically daily.   simvastatin (ZOCOR) 10 MG tablet TAKE 1 TABLET(10 MG) BY MOUTH AT BEDTIME   tamsulosin (FLOMAX) 0.4 MG CAPS capsule TAKE 1 CAPSULE (0.4 MG TOTAL) BY MOUTH DAILY.   Facility-Administered Encounter Medications as of 03/04/2021  Medication   0.9 %  sodium chloride infusion    Reviewed chart prior to disease state call. Spoke with patient  regarding BP  Recent Office Vitals: BP Readings from Last 3 Encounters:  01/31/21 (!) 102/58  01/10/21 (!) 128/50  12/12/20 (!) 150/70   Pulse Readings from Last 3 Encounters:  01/31/21 80  01/10/21 75  12/12/20 68    Wt Readings from Last 3 Encounters:  01/31/21 161 lb (73 kg)  01/10/21 161 lb 3.2 oz (73.1 kg)  12/12/20 161 lb (73 kg)     Kidney Function Lab Results  Component Value Date/Time   CREATININE 0.86 12/09/2020 08:45 AM   CREATININE 0.89 08/27/2020 08:59 AM   GFR 91.76 07/08/2015 07:39 AM   GFRNONAA 81 12/09/2020 08:45 AM   GFRAA 94 12/09/2020 08:45 AM    BMP Latest Ref Rng & Units 12/09/2020 08/27/2020 05/31/2020  Glucose 65 - 99 mg/dL 171(H) 166(H) 165(H)  BUN 7 - 25 mg/dL 17 18 22   Creatinine 0.70 - 1.11 mg/dL 0.86 0.89 0.86  BUN/Creat Ratio 6 - 22 (calc) NOT APPLICABLE NOT APPLICABLE 32(R)  Sodium 135 - 146 mmol/L 139 140 137  Potassium 3.5 - 5.3 mmol/L 4.7 4.1 4.1  Chloride 98 - 110 mmol/L 103 104 101  CO2 20 - 32 mmol/L 28 25 23   Calcium 8.6 - 10.3 mg/dL 8.7 9.0 9.1    Current antihypertensive regimen:  Metoprolol 25 mg 1/2 daily  Amlodipine 5 mg 1 tablet daily  How often are you checking your Blood Pressure? several times per month   Current home BP readings: Patient stated he is not at home  and cant remember what his numbers were.   What recent interventions/DTPs have been made by any provider to improve Blood Pressure control since last CPP Visit: None   Any recent hospitalizations or ED visits since last visit with CPP? Patient stated no.  What diet changes have been made to improve Blood Pressure Control?  Patient stated he does eat a well rounded diet. Patient stated he drinks a half a gallon of water daily.   What exercise is being done to improve your Blood Pressure Control?  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   Adherence Review: Is the patient currently on ACE/ARB medication? None.   Does the patient have >5 day gap  between last estimated fill dates? Per misc rtps, yes.  Star Rating Drugs: Simvastatin 10 mg  90 DS 11/05/20  Follow-Up:Pharmacist Review  Charlann Lange, RMA Clinical Pharmacist Assistant [phone]  10 minutes spent in review, coordination, and documentation.  Reviewed by: Beverly Milch, PharmD Clinical Pharmacist [facility] [phone]

## 2021-03-17 ENCOUNTER — Other Ambulatory Visit (HOSPITAL_COMMUNITY): Payer: Self-pay

## 2021-03-17 DIAGNOSIS — D696 Thrombocytopenia, unspecified: Secondary | ICD-10-CM

## 2021-03-17 DIAGNOSIS — R69 Illness, unspecified: Secondary | ICD-10-CM | POA: Diagnosis not present

## 2021-03-18 ENCOUNTER — Other Ambulatory Visit: Payer: Self-pay

## 2021-03-18 ENCOUNTER — Inpatient Hospital Stay (HOSPITAL_COMMUNITY): Payer: Medicare HMO | Attending: Hematology

## 2021-03-18 DIAGNOSIS — D696 Thrombocytopenia, unspecified: Secondary | ICD-10-CM | POA: Insufficient documentation

## 2021-03-18 LAB — CBC WITH DIFFERENTIAL/PLATELET
Abs Immature Granulocytes: 0.02 10*3/uL (ref 0.00–0.07)
Basophils Absolute: 0.1 10*3/uL (ref 0.0–0.1)
Basophils Relative: 1 %
Eosinophils Absolute: 0.1 10*3/uL (ref 0.0–0.5)
Eosinophils Relative: 1 %
HCT: 39.9 % (ref 39.0–52.0)
Hemoglobin: 13.6 g/dL (ref 13.0–17.0)
Immature Granulocytes: 0 %
Lymphocytes Relative: 19 %
Lymphs Abs: 1.6 10*3/uL (ref 0.7–4.0)
MCH: 30.6 pg (ref 26.0–34.0)
MCHC: 34.1 g/dL (ref 30.0–36.0)
MCV: 89.9 fL (ref 80.0–100.0)
Monocytes Absolute: 0.5 10*3/uL (ref 0.1–1.0)
Monocytes Relative: 7 %
Neutro Abs: 6.1 10*3/uL (ref 1.7–7.7)
Neutrophils Relative %: 72 %
Platelets: 201 10*3/uL (ref 150–400)
RBC: 4.44 MIL/uL (ref 4.22–5.81)
RDW: 12.4 % (ref 11.5–15.5)
WBC: 8.3 10*3/uL (ref 4.0–10.5)
nRBC: 0 % (ref 0.0–0.2)

## 2021-03-24 NOTE — Progress Notes (Signed)
[place], [place] [address]   CLINIC:  Medical Oncology/Hematology  PCP:  Susy Frizzle, MD [address] [place] [address]  [phone]  REASON FOR VISIT:  Follow-up for thrombocytopenia  PRIOR THERAPY: none  CURRENT THERAPY: surveillance  INTERVAL HISTORY:  [name], a 80 y.o. male, returns for routine follow-up for his thrombocytopenia. [place]aye was last seen on 09/17/2020.  Today he reports feeling well. He reports that he is bruising easily. He has not recently taken any steroids. He denies any recent leg swellings. He had an infected tooth removed last week.   REVIEW OF SYSTEMS:  Review of Systems  Constitutional:  Positive for appetite change (40%) and fatigue (75%).  Cardiovascular:  Negative for leg swelling.  Neurological:  Positive for headaches.  Hematological:  Bruises/bleeds easily.  All other systems reviewed and are negative.  PAST MEDICAL/SURGICAL HISTORY:  Past Medical History:  Diagnosis Date   Allergy    grass etc.   Barrett's esophagus    BPH (benign prostatic hyperplasia)    Chest pain, atypical    Chronic kidney disease    kidney stones   Coronary artery disease    GERD (gastroesophageal reflux disease)    Barrett's   Hernia    Hyperlipidemia    Hypertension    Myocardial infarction ([place])    Thrombocytopenia ([place])    Ulcer 1960   Past Surgical History:  Procedure Laterality Date   CARDIAC CATHETERIZATION  02/14/2007   MITRAL REGURGITATION. LV NORMAL   CHOLECYSTECTOMY     COLONOSCOPY     CORONARY ARTERY BYPASS GRAFT     twice  2 by passes each time   INGUINAL HERNIA REPAIR     bilateral done twice   LITHOTRIPSY     UPPER GASTROINTESTINAL ENDOSCOPY      SOCIAL HISTORY:  Social History   Socioeconomic History   Marital status: Married    Spouse name: Not on file   Number of children: 2   Years of education: Not on file   Highest education level: Not on file   Occupational History   Occupation: Retired  Tobacco Use   Smoking status: Never   Smokeless tobacco: Never  Vaping Use   Vaping Use: Never used  Substance and Sexual Activity   Alcohol use: No   Drug use: No   Sexual activity: Not Currently  Other Topics Concern   Not on file  Social History Narrative   Not on file   Social Determinants of Health   Financial Resource Strain: Low Risk    Difficulty of Paying Living Expenses: Not hard at all  Food Insecurity: No Food Insecurity   Worried About Charity fundraiser in the Last Year: Never true   [place] in the Last Year: Never true  Transportation Needs: No Transportation Needs   Lack of Transportation (Medical): No   Lack of Transportation (Non-Medical): No  Physical Activity: Inactive   Days of Exercise per Week: 0 days   Minutes of Exercise per Session: 0 min  Stress: No Stress Concern Present   Feeling of Stress : Not at all  Social Connections: Moderately Integrated   Frequency of Communication with Friends and Family: More than three times a week   Frequency of Social Gatherings with Friends and Family: Three times a week   Attends Religious Services: 1 to 4 times per year   Active Member of  Clubs or Organizations: No   Attends Music therapist: Never   Marital Status: Married  Human resources officer Violence: Not At Risk   Fear of Current or Ex-Partner: No   Emotionally Abused: No   Physically Abused: No   Sexually Abused: No    FAMILY HISTORY:  Family History  Problem Relation Age of Onset   Stroke Father    Heart attack Mother    Heart attack Maternal Grandmother    Heart attack Maternal Grandfather    Heart disease Sister    Heart disease Brother    Heart disease Sister    Heart disease Brother    Prostate cancer Brother    Colon cancer Neg Hx    Esophageal cancer Neg Hx    Rectal cancer Neg Hx    Stomach cancer Neg Hx     CURRENT MEDICATIONS:  Current Outpatient Medications   Medication Sig Dispense Refill   amLODipine (NORVASC) 5 MG tablet Take 1 tablet (5 mg total) by mouth daily. 90 tablet 3   aspirin 81 MG tablet Take 81 mg by mouth daily.     Cholecalciferol (VITAMIN D) 2000 units CAPS Take 1 capsule by mouth daily.     finasteride (PROSCAR) 5 MG tablet TAKE 1 TABLET(5 MG) BY MOUTH DAILY. 90 tablet 3   lansoprazole (PREVACID) 30 MG capsule TAKE 1 CAPSULE EVERY DAY 90 capsule 3   metoprolol succinate (TOPROL-XL) 25 MG 24 hr tablet TAKE 1/2 TABLET EVERY DAY 45 tablet 3   Multiple Vitamin (MULTIVITAMIN) tablet Take 1 tablet by mouth daily.     nitroGLYCERIN (NITROSTAT) 0.4 MG SL tablet Place 1 tablet (0.4 mg total) under the tongue every 5 (five) minutes as needed. 75 tablet 3   Omega-3 Fatty Acids (FISH OIL) 1000 MG CPDR Take 1,000 mg by mouth every morning.     silver sulfADIAZINE (SILVADENE) 1 % cream Apply 1 application topically daily. 50 g 0   simvastatin (ZOCOR) 10 MG tablet TAKE 1 TABLET(10 MG) BY MOUTH AT BEDTIME 90 tablet 3   tamsulosin (FLOMAX) 0.4 MG CAPS capsule TAKE 1 CAPSULE (0.4 MG TOTAL) BY MOUTH DAILY. 90 capsule 1   Current Facility-Administered Medications  Medication Dose Route Frequency Provider Last Rate Last Admin   0.9 %  sodium chloride infusion  500 mL Intravenous Once Armbruster, Carlota Raspberry, MD        ALLERGIES:  Allergies  Allergen Reactions   Imdur [Isosorbide Mononitrate]     Unknown, per pt    Meloxicam Swelling    Facial swelling     PHYSICAL EXAM:  Performance status (ECOG): 0 - Asymptomatic  There were no vitals filed for this visit. Wt Readings from Last 3 Encounters:  01/31/21 161 lb (73 kg)  01/10/21 161 lb 3.2 oz (73.1 kg)  12/12/20 161 lb (73 kg)   Physical Exam Vitals reviewed.  Constitutional:      Appearance: Normal appearance.  Cardiovascular:     Rate and Rhythm: Normal rate and regular rhythm.     Pulses: Normal pulses.     Heart sounds: Normal heart sounds.  Pulmonary:     Effort: Pulmonary  effort is normal.     Breath sounds: Normal breath sounds.  Musculoskeletal:     Right lower leg: No edema.     Left lower leg: No edema.  Neurological:     General: No focal deficit present.     Mental Status: He is alert and oriented to person, place, and time.  Psychiatric:        Mood and Affect: Mood normal.        Behavior: Behavior normal.    LABORATORY DATA:  I have reviewed the labs as listed.  CBC Latest Ref Rng & Units 03/18/2021 12/09/2020 09/10/2020  WBC 4.0 - 10.5 K/uL 8.3 7.4 7.8  Hemoglobin 13.0 - 17.0 g/dL 13.6 14.3 14.2  Hematocrit 39.0 - 52.0 % 39.9 42.1 41.6  Platelets 150 - 400 K/uL 201 68(L) 155   CMP Latest Ref Rng & Units 12/09/2020 08/27/2020 05/31/2020  Glucose 65 - 99 mg/dL 171(H) 166(H) 165(H)  BUN 7 - 25 mg/dL 17 18 22   Creatinine 0.70 - 1.11 mg/dL 0.86 0.89 0.86  Sodium 135 - 146 mmol/L 139 140 137  Potassium 3.5 - 5.3 mmol/L 4.7 4.1 4.1  Chloride 98 - 110 mmol/L 103 104 101  CO2 20 - 32 mmol/L 28 25 23   Calcium 8.6 - 10.3 mg/dL 8.7 9.0 9.1  Total Protein 6.1 - 8.1 g/dL 5.9(L) 6.0(L) 6.1  Total Bilirubin 0.2 - 1.2 mg/dL 0.6 0.6 0.5  Alkaline Phos 48 - 121 IU/L - - 71  AST 10 - 35 U/L 14 15 14   ALT 9 - 46 U/L 11 15 11       Component Value Date/Time   RBC 4.44 03/18/2021 1330   MCV 89.9 03/18/2021 1330   MCV 87 05/31/2020 0848   MCH 30.6 03/18/2021 1330   MCHC 34.1 03/18/2021 1330   RDW 12.4 03/18/2021 1330   RDW 12.4 05/31/2020 0848   LYMPHSABS 1.6 03/18/2021 1330   MONOABS 0.5 03/18/2021 1330   EOSABS 0.1 03/18/2021 1330   BASOSABS 0.1 03/18/2021 1330    DIAGNOSTIC IMAGING:  I have independently reviewed the scans and discussed with the patient. No results found.   ASSESSMENT:  1.  Mild to moderate thrombocytopenia: -Patient evaluated for moderate thrombocytopenia.  CBC on 02/08/2020 showed platelet count 72 with normal white count and hemoglobin. -He had platelet count low since 2011, mostly in the 120 range. -CTAP on 07/12/2019 shows  normal spleen with no hepatic masses.  Normal enlarged lymph nodes. -Reviewed labs from 02/29/2020 which showed normal platelet count of 158.  Nutritional deficiency work-up and connective tissue disorder work-up was negative. - Differential diagnosis includes platelet clumping versus immune mediated thrombocytopenia.   PLAN:  1.  Mild to moderate thrombocytopenia: -He is followed in our clinic for intermittent thrombocytopenia. - Does not report any recent infections or new medications. - CBC on 12/01/2020 shows platelet count 68. - He does not report any fevers, night sweats or weight loss.  Has some easy bruising on the upper extremities which is stable.  No other bleeding. - Reviewed labs from 03/18/2021 which showed platelet count 201.  Hemoglobin and white count and differential were normal. - RTC 6 months for follow-up with labs.  Orders placed this encounter:  No orders of the defined types were placed in this encounter.    Derek Jack, MD [place] [phone]   I, Thana Ates, am acting as a scribe for Dr. Derek Jack.  I, Derek Jack MD, have reviewed the above documentation for accuracy and completeness, and I agree with the above.

## 2021-03-25 ENCOUNTER — Other Ambulatory Visit: Payer: Self-pay

## 2021-03-25 ENCOUNTER — Inpatient Hospital Stay (HOSPITAL_BASED_OUTPATIENT_CLINIC_OR_DEPARTMENT_OTHER): Payer: Medicare HMO | Admitting: Hematology

## 2021-03-25 VITALS — BP 106/62 | HR 81 | Temp 97.2°F | Resp 18 | Wt 156.6 lb

## 2021-03-25 DIAGNOSIS — D696 Thrombocytopenia, unspecified: Secondary | ICD-10-CM | POA: Diagnosis not present

## 2021-03-25 NOTE — Patient Instructions (Addendum)
[facility] Discharge Instructions  You were seen today by Dr. Katragadda. He went over your recent results. Dr. Katragadda will see you back in 6 months for labs and follow up.   Thank you for choosing [facility] at [facility] to provide your oncology and hematology care.  To afford each patient quality time with our provider, please arrive at least 15 minutes before your scheduled appointment time.   If you have a lab appointment with the Cancer Center please come in thru the Main Entrance and check in at the main information desk  You need to re-schedule your appointment should you arrive 10 or more minutes late.  We strive to give you quality time with our providers, and arriving late affects you and other patients whose appointments are after yours.  Also, if you no show three or more times for appointments you may be dismissed from the clinic at the providers discretion.     Again, thank you for choosing [facility].  Our hope is that these requests will decrease the amount of time that you wait before being seen by our physicians.       _____________________________________________________________  Should you have questions after your visit to  Cancer Center, please contact our office at [phone] between the hours of 8:00 a.m. and 4:30 p.m.  Voicemails left after 4:00 p.m. will not be returned until the following business day.  For prescription refill requests, have your pharmacy contact our office and allow 72 hours.    Cancer Center Support Programs:   > Cancer Support Group  2nd Tuesday of the month 1pm-2pm, Journey Room    

## 2021-04-09 ENCOUNTER — Other Ambulatory Visit: Payer: Self-pay

## 2021-04-09 ENCOUNTER — Other Ambulatory Visit: Payer: Medicare HMO

## 2021-04-09 DIAGNOSIS — Z136 Encounter for screening for cardiovascular disorders: Secondary | ICD-10-CM

## 2021-04-09 DIAGNOSIS — I10 Essential (primary) hypertension: Secondary | ICD-10-CM | POA: Diagnosis not present

## 2021-04-09 DIAGNOSIS — D696 Thrombocytopenia, unspecified: Secondary | ICD-10-CM | POA: Diagnosis not present

## 2021-04-09 DIAGNOSIS — Z13220 Encounter for screening for lipoid disorders: Secondary | ICD-10-CM | POA: Diagnosis not present

## 2021-04-09 DIAGNOSIS — E785 Hyperlipidemia, unspecified: Secondary | ICD-10-CM | POA: Diagnosis not present

## 2021-04-09 DIAGNOSIS — E118 Type 2 diabetes mellitus with unspecified complications: Secondary | ICD-10-CM

## 2021-04-10 LAB — COMPLETE METABOLIC PANEL WITH GFR
AG Ratio: 2.1 (calc) (ref 1.0–2.5)
ALT: 10 U/L (ref 9–46)
AST: 14 U/L (ref 10–35)
Albumin: 4.2 g/dL (ref 3.6–5.1)
Alkaline phosphatase (APISO): 60 U/L (ref 35–144)
BUN: 21 mg/dL (ref 7–25)
CO2: 27 mmol/L (ref 20–32)
Calcium: 8.9 mg/dL (ref 8.6–10.3)
Chloride: 104 mmol/L (ref 98–110)
Creat: 0.84 mg/dL (ref 0.70–1.11)
GFR, Est African American: 95 mL/min/{1.73_m2} (ref >=60)
GFR, Est Non African American: 82 mL/min/{1.73_m2} (ref >=60)
Globulin: 2 g/dL (calc) (ref 1.9–3.7)
Glucose, Bld: 166 mg/dL — ABNORMAL HIGH (ref 65–99)
Potassium: 3.8 mmol/L (ref 3.5–5.3)
Sodium: 139 mmol/L (ref 135–146)
Total Bilirubin: 0.8 mg/dL (ref 0.2–1.2)
Total Protein: 6.2 g/dL (ref 6.1–8.1)

## 2021-04-10 LAB — CBC WITH DIFFERENTIAL/PLATELET
Absolute Monocytes: 538 cells/uL (ref 200–950)
Basophils Absolute: 32 cells/uL (ref 0–200)
Basophils Relative: 0.5 %
Eosinophils Absolute: 109 cells/uL (ref 15–500)
Eosinophils Relative: 1.7 %
HCT: 40.7 % (ref 38.5–50.0)
Hemoglobin: 13.7 g/dL (ref 13.2–17.1)
Lymphs Abs: 1530 cells/uL (ref 850–3900)
MCH: 30.2 pg (ref 27.0–33.0)
MCHC: 33.7 g/dL (ref 32.0–36.0)
MCV: 89.6 fL (ref 80.0–100.0)
MPV: 10.8 fL (ref 7.5–12.5)
Monocytes Relative: 8.4 %
Neutro Abs: 4192 cells/uL (ref 1500–7800)
Neutrophils Relative %: 65.5 %
Platelets: 59 10*3/uL — ABNORMAL LOW (ref 140–400)
RBC: 4.54 10*6/uL (ref 4.20–5.80)
RDW: 12.2 % (ref 11.0–15.0)
Total Lymphocyte: 23.9 %
WBC: 6.4 10*3/uL (ref 3.8–10.8)

## 2021-04-10 LAB — LIPID PANEL
Cholesterol: 111 mg/dL (ref <200)
HDL: 39 mg/dL — ABNORMAL LOW (ref >=40)
LDL Cholesterol (Calc): 58 mg/dL (calc)
Non-HDL Cholesterol (Calc): 72 mg/dL (calc) (ref <130)
Total CHOL/HDL Ratio: 2.8 (calc) (ref <5.0)
Triglycerides: 68 mg/dL (ref <150)

## 2021-04-10 LAB — HEMOGLOBIN A1C
Hgb A1c MFr Bld: 7.3 % of total Hgb — ABNORMAL HIGH (ref <5.7)
Mean Plasma Glucose: 163 mg/dL
eAG (mmol/L): 9 mmol/L

## 2021-04-24 ENCOUNTER — Other Ambulatory Visit: Payer: Self-pay

## 2021-04-24 ENCOUNTER — Ambulatory Visit (INDEPENDENT_AMBULATORY_CARE_PROVIDER_SITE_OTHER): Payer: Medicare HMO | Admitting: Family Medicine

## 2021-04-24 ENCOUNTER — Encounter: Payer: Self-pay | Admitting: Family Medicine

## 2021-04-24 VITALS — BP 110/66 | HR 64 | Temp 98.2°F | Resp 16 | Ht 69.0 in | Wt 159.0 lb

## 2021-04-24 DIAGNOSIS — D696 Thrombocytopenia, unspecified: Secondary | ICD-10-CM | POA: Diagnosis not present

## 2021-04-24 DIAGNOSIS — E118 Type 2 diabetes mellitus with unspecified complications: Secondary | ICD-10-CM | POA: Diagnosis not present

## 2021-04-24 NOTE — Progress Notes (Signed)
Subjective:    Patient ID: Andrew [name], male    DOB: [birth year], 80 y.o.   MRN: [id number]  Lab on 04/09/2021  Component Date Value Ref Range Status   WBC 04/09/2021 6.4  3.8 - 10.8 Thousand/uL Final   RBC 04/09/2021 4.54  4.20 - 5.80 Million/uL Final   Hemoglobin 04/09/2021 13.7  13.2 - 17.1 g/dL Final   HCT 04/09/2021 40.7  38.5 - 50.0 % Final   MCV 04/09/2021 89.6  80.0 - 100.0 fL Final   MCH 04/09/2021 30.2  27.0 - 33.0 pg Final   MCHC 04/09/2021 33.7  32.0 - 36.0 g/dL Final   RDW 04/09/2021 12.2  11.0 - 15.0 % Final   Platelets 04/09/2021 59 (A) 140 - 400 Thousand/uL Final   MPV 04/09/2021 10.8  7.5 - 12.5 fL Final   Neutro Abs 04/09/2021 4,192  1,500 - 7,800 cells/uL Final   Lymphs Abs 04/09/2021 1,530  850 - 3,900 cells/uL Final   Absolute Monocytes 04/09/2021 538  200 - 950 cells/uL Final   Eosinophils Absolute 04/09/2021 109  15 - 500 cells/uL Final   Basophils Absolute 04/09/2021 32  0 - 200 cells/uL Final   Neutrophils Relative % 04/09/2021 65.5  % Final   Total Lymphocyte 04/09/2021 23.9  % Final   Monocytes Relative 04/09/2021 8.4  % Final   Eosinophils Relative 04/09/2021 1.7  % Final   Basophils Relative 04/09/2021 0.5  % Final   Glucose, Bld 04/09/2021 166 (A) 65 - 99 mg/dL Final   Comment: .            Fasting reference interval . For someone without known diabetes, a glucose value >125 mg/dL indicates that they may have diabetes and this should be confirmed with a follow-up test. .    BUN 04/09/2021 21  7 - 25 mg/dL Final   Creat 04/09/2021 0.84  0.70 - 1.11 mg/dL Final   Comment: For patients >75 years of age, the reference limit for Creatinine is approximately 13% higher for people identified as African-American. .    GFR, Est Non African American 04/09/2021 82  > OR = 60 mL/min/1.28m2 Final   GFR, Est African American 04/09/2021 95  > OR = 60 mL/min/1.44m2 Final   BUN/Creatinine Ratio 95/06/3266 NOT APPLICABLE  6 - 22 (calc) Final   Sodium  04/09/2021 139  135 - 146 mmol/L Final   Potassium 04/09/2021 3.8  3.5 - 5.3 mmol/L Final   Chloride 04/09/2021 104  98 - 110 mmol/L Final   CO2 04/09/2021 27  20 - 32 mmol/L Final   Calcium 04/09/2021 8.9  8.6 - 10.3 mg/dL Final   Total Protein 04/09/2021 6.2  6.1 - 8.1 g/dL Final   Albumin 04/09/2021 4.2  3.6 - 5.1 g/dL Final   Globulin 04/09/2021 2.0  1.9 - 3.7 g/dL (calc) Final   AG Ratio 04/09/2021 2.1  1.0 - 2.5 (calc) Final   Total Bilirubin 04/09/2021 0.8  0.2 - 1.2 mg/dL Final   Alkaline phosphatase (APISO) 04/09/2021 60  35 - 144 U/L Final   AST 04/09/2021 14  10 - 35 U/L Final   ALT 04/09/2021 10  9 - 46 U/L Final   Hgb A1c MFr Bld 04/09/2021 7.3 (A) <5.7 % of total Hgb Final   Comment: For someone without known diabetes, a hemoglobin A1c value of 6.5% or greater indicates that they may have  diabetes and this should be confirmed with a follow-up  test. . For someone with  known diabetes, a value <7% indicates  that their diabetes is well controlled and a value  greater than or equal to 7% indicates suboptimal  control. A1c targets should be individualized based on  duration of diabetes, age, comorbid conditions, and  other considerations. . Currently, no consensus exists regarding use of hemoglobin A1c for diagnosis of diabetes for children. .    Mean Plasma Glucose 04/09/2021 163  mg/dL Final   eAG (mmol/L) 04/09/2021 9.0  mmol/L Final   Cholesterol 04/09/2021 111  <200 mg/dL Final   HDL 04/09/2021 39 (A) > OR = 40 mg/dL Final   Triglycerides 04/09/2021 68  <150 mg/dL Final   LDL Cholesterol (Calc) 04/09/2021 58  mg/dL (calc) Final   Comment: Reference range: <100 . Desirable range <100 mg/dL for primary prevention;   <70 mg/dL for patients with CHD or diabetic patients  with > or = 2 CHD risk factors. [name] LDL-C is now calculated using the Martin-Hopkins  calculation, which is a validated novel method providing  better accuracy than the Friedewald equation in the   estimation of LDL-C.  Cresenciano Genre et al. Annamaria Helling. 9924;268(34): 2061-2068  (http://education.[employer].com/faq/FAQ164)    Total CHOL/HDL Ratio 04/09/2021 2.8  <5.0 (calc) Final   Non-HDL Cholesterol (Calc) 04/09/2021 72  <130 mg/dL (calc) Final   Comment: For patients with diabetes plus 1 major ASCVD risk  factor, treating to a non-HDL-C goal of <100 mg/dL  (LDL-C of <70 mg/dL) is considered a therapeutic  option.   Patient is here today to review his recent lab work.  His A1c rose from 7-7.3.  He admits to eating a lot of fruit, peanut butter, graham crackers, and an effort to try to gain weight.  He is also been drinking Ensure.  He has had severe diarrhea in the past so is hesitant to want a start metformin as I had suggested.  He would like to try to work on diet before making any medication changes.  Of note, his platelet count also dropped to 59 down from 200.  It was 68 earlier in the year.  The differential diagnosis from hematology was platelet clumping versus ITP.  Given the sudden frequent changes I favor platelet clumping.  He denies any bleeding or bruising.  Past Medical History:  Diagnosis Date   Allergy    grass etc.   Barrett's esophagus    BPH (benign prostatic hyperplasia)    Chest pain, atypical    Chronic kidney disease    kidney stones   Coronary artery disease    GERD (gastroesophageal reflux disease)    Barrett's   Hernia    Hyperlipidemia    Hypertension    Myocardial infarction ([place])    Thrombocytopenia ([place])    Ulcer 1960   Past Surgical History:  Procedure Laterality Date   CARDIAC CATHETERIZATION  02/14/2007   MITRAL REGURGITATION. LV NORMAL   CHOLECYSTECTOMY     COLONOSCOPY     CORONARY ARTERY BYPASS GRAFT     twice  2 by passes each time   INGUINAL HERNIA REPAIR     bilateral done twice   LITHOTRIPSY     UPPER GASTROINTESTINAL ENDOSCOPY     Current Outpatient Medications on File Prior to Visit  Medication Sig Dispense Refill   amLODipine  (NORVASC) 5 MG tablet Take 1 tablet (5 mg total) by mouth daily. 90 tablet 3   aspirin 81 MG tablet Take 81 mg by mouth daily.     Cholecalciferol (VITAMIN D) 2000 units  CAPS Take 1 capsule by mouth daily.     finasteride (PROSCAR) 5 MG tablet TAKE 1 TABLET(5 MG) BY MOUTH DAILY. 90 tablet 3   lansoprazole (PREVACID) 30 MG capsule TAKE 1 CAPSULE EVERY DAY 90 capsule 3   metoprolol succinate (TOPROL-XL) 25 MG 24 hr tablet TAKE 1/2 TABLET EVERY DAY 45 tablet 3   Multiple Vitamin (MULTIVITAMIN) tablet Take 1 tablet by mouth daily.     Omega-3 Fatty Acids (FISH OIL) 1000 MG CPDR Take 1,000 mg by mouth every morning.     simvastatin (ZOCOR) 10 MG tablet TAKE 1 TABLET(10 MG) BY MOUTH AT BEDTIME 90 tablet 3   tamsulosin (FLOMAX) 0.4 MG CAPS capsule TAKE 1 CAPSULE (0.4 MG TOTAL) BY MOUTH DAILY. 90 capsule 1   nitroGLYCERIN (NITROSTAT) 0.4 MG SL tablet Place 1 tablet (0.4 mg total) under the tongue every 5 (five) minutes as needed. (Patient not taking: Reported on 04/24/2021) 75 tablet 3   Current Facility-Administered Medications on File Prior to Visit  Medication Dose Route Frequency Provider Last Rate Last Admin   0.9 %  sodium chloride infusion  500 mL Intravenous Once Armbruster, Carlota Raspberry, MD       Allergies  Allergen Reactions   Imdur [Isosorbide Mononitrate]     Unknown, per pt    Meloxicam Swelling    Facial swelling    Social History   Socioeconomic History   Marital status: Married    Spouse name: Not on file   Number of children: 2   Years of education: Not on file   Highest education level: Not on file  Occupational History   Occupation: Retired  Tobacco Use   Smoking status: Never   Smokeless tobacco: Never  Vaping Use   Vaping Use: Never used  Substance and Sexual Activity   Alcohol use: No   Drug use: No   Sexual activity: Not Currently  Other Topics Concern   Not on file  Social History Narrative   Not on file   Social Determinants of Health   Financial  Resource Strain: Low Risk    Difficulty of Paying Living Expenses: Not hard at all  Food Insecurity: No Food Insecurity   Worried About Charity fundraiser in the Last Year: Never true   Ran Out of Food in the Last Year: Never true  Transportation Needs: No Transportation Needs   Lack of Transportation (Medical): No   Lack of Transportation (Non-Medical): No  Physical Activity: Inactive   Days of Exercise per Week: 0 days   Minutes of Exercise per Session: 0 min  Stress: No Stress Concern Present   Feeling of Stress : Not at all  Social Connections: Moderately Integrated   Frequency of Communication with Friends and Family: More than three times a week   Frequency of Social Gatherings with Friends and Family: Three times a week   Attends Religious Services: 1 to 4 times per year   Active Member of Clubs or Organizations: No   Attends Archivist Meetings: Never   Marital Status: Married  Human resources officer Violence: Not At Risk   Fear of Current or Ex-Partner: No   Emotionally Abused: No   Physically Abused: No   Sexually Abused: No      Review of Systems  All other systems reviewed and are negative.     Objective:   Physical Exam Vitals reviewed.  Constitutional:      Appearance: Normal appearance.  Cardiovascular:     Rate and  Rhythm: Normal rate and regular rhythm.     Heart sounds: Normal heart sounds. No murmur heard.   No friction rub. No gallop.  Pulmonary:     Effort: Pulmonary effort is normal. No respiratory distress.     Breath sounds: Normal breath sounds. No wheezing, rhonchi or rales.  Abdominal:     General: Abdomen is flat. Bowel sounds are normal.     Palpations: Abdomen is soft.  Musculoskeletal:     Right lower leg: No edema.     Left lower leg: No edema.  Neurological:     Mental Status: He is alert.          Assessment & Plan:  Controlled type 2 diabetes mellitus with complication, without long-term current use of insulin  (HCC)  Thrombocytopenia (HCC) Diabetes is fairly controlled at 7.3.  Patient would like to reduce the carbohydrates in his diet before adding any medication.  We will recheck lab work in 3 to 4 months and at that time consider adding medication if still elevated.  I believe the low platelets is likely due to platelet clumping given the frequent changes in his platelet counts over the last few months.  Plan to simply repeat a CBC in 3 to 4 months unless the patient develops significant bleeding or bruising.

## 2021-05-12 ENCOUNTER — Telehealth: Payer: Self-pay

## 2021-05-19 DIAGNOSIS — D225 Melanocytic nevi of trunk: Secondary | ICD-10-CM | POA: Diagnosis not present

## 2021-05-19 DIAGNOSIS — L821 Other seborrheic keratosis: Secondary | ICD-10-CM | POA: Diagnosis not present

## 2021-05-19 DIAGNOSIS — X32XXXD Exposure to sunlight, subsequent encounter: Secondary | ICD-10-CM | POA: Diagnosis not present

## 2021-05-19 DIAGNOSIS — L570 Actinic keratosis: Secondary | ICD-10-CM | POA: Diagnosis not present

## 2021-05-27 ENCOUNTER — Other Ambulatory Visit: Payer: Medicare HMO | Admitting: *Deleted

## 2021-05-27 ENCOUNTER — Other Ambulatory Visit: Payer: Self-pay

## 2021-05-27 DIAGNOSIS — I779 Disorder of arteries and arterioles, unspecified: Secondary | ICD-10-CM | POA: Diagnosis not present

## 2021-05-27 DIAGNOSIS — E785 Hyperlipidemia, unspecified: Secondary | ICD-10-CM | POA: Diagnosis not present

## 2021-05-27 DIAGNOSIS — I2510 Atherosclerotic heart disease of native coronary artery without angina pectoris: Secondary | ICD-10-CM

## 2021-05-27 DIAGNOSIS — I10 Essential (primary) hypertension: Secondary | ICD-10-CM | POA: Diagnosis not present

## 2021-05-27 LAB — BASIC METABOLIC PANEL
BUN/Creatinine Ratio: 23 (ref 10–24)
BUN: 19 mg/dL (ref 8–27)
CO2: 23 mmol/L (ref 20–29)
Calcium: 9 mg/dL (ref 8.6–10.2)
Chloride: 101 mmol/L (ref 96–106)
Creatinine, Ser: 0.83 mg/dL (ref 0.76–1.27)
Glucose: 190 mg/dL — ABNORMAL HIGH (ref 65–99)
Potassium: 4.1 mmol/L (ref 3.5–5.2)
Sodium: 138 mmol/L (ref 134–144)
eGFR: 87 mL/min/{1.73_m2} (ref >59)

## 2021-05-27 LAB — HEPATIC FUNCTION PANEL
ALT: 15 IU/L (ref 0–44)
AST: 17 IU/L (ref 0–40)
Albumin: 4.2 g/dL (ref 3.6–4.6)
Alkaline Phosphatase: 69 IU/L (ref 44–121)
Bilirubin Total: 0.4 mg/dL (ref 0.0–1.2)
Bilirubin, Direct: 0.14 mg/dL (ref 0.00–0.40)
Total Protein: 6.1 g/dL (ref 6.0–8.5)

## 2021-05-27 LAB — LIPID PANEL
Chol/HDL Ratio: 2.8 ratio (ref 0.0–5.0)
Cholesterol, Total: 114 mg/dL (ref 100–199)
HDL: 41 mg/dL (ref >39)
LDL Chol Calc (NIH): 61 mg/dL (ref 0–99)
Triglycerides: 54 mg/dL (ref 0–149)
VLDL Cholesterol Cal: 12 mg/dL (ref 5–40)

## 2021-05-29 ENCOUNTER — Encounter: Payer: Self-pay | Admitting: Cardiovascular Disease

## 2021-05-29 NOTE — Progress Notes (Signed)
This encounter was created in error - please disregard.

## 2021-05-30 ENCOUNTER — Ambulatory Visit: Payer: Medicare HMO | Admitting: Cardiovascular Disease

## 2021-05-30 ENCOUNTER — Encounter: Payer: Medicare HMO | Admitting: Cardiovascular Disease

## 2021-06-01 ENCOUNTER — Encounter: Payer: Self-pay | Admitting: Cardiovascular Disease

## 2021-06-01 NOTE — Progress Notes (Signed)
[name] Date of Birth  11/15/1940 [facility] [address]    [place], [place]  [address] [phone]  Fax  [phone]   problem list 1. Coronary artery disease 2. Hyperlipidemia 3.  Previous notes.   Tierney is a 80 year old gentleman with a history of coronary artery disease. He status post coronary artery bypass grafting. He also has a history of dyslipidemia.  He's recently been having some problems with back pain.  These episodes of back pain would typically occur when he is doing his normal household chores. It would typically occur in the middle of his back and radiate up through to the front of his chest and between the shoulder blades.  This was not similar to his previous episodes of angina prior to his bypass grafting.  He's been exercising on a regular basis and has not had these symptoms with exercise.   Sept. 11. 2014:  Johnson is doing ok.  No angina.  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  No   Sept. 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  Raised a garden this years.  No CP. Works out regularly.    Oct. 4, 2016: Doing great.    i reviewed his labs with him.   Everything is stable .  Still raising cattle.  Beef prices are down quite a bit this year.    Oct. 24, 2017:   Doing well Still raising cows.    No CP .    September 29, 2017:  Andrus is seen today.  He has a history of coronary artery disease and hyperlipidemia. No CP  Has some shortness of breath. Has had some lower back pain and abd.  Still raising cows .   Hurt his right arm while using his  tractor   His last heart catheterization was Feb 14, 2007. The left anterior descending artery is occluded.  The saphenous vein graft to the second diagonal artery is a large graft and fills the left anterior descending artery in a retrograde fashion.  There is a 90% stenosis in the mid/distal LAD. The LIMA is atretic and does not supply any significant flow to the  LAD. Left circumflex artery has minor luminal irregularities.  The left circumflex artery is dominant.   May 11, 2018:  Doing well.   Treadmill for about 15 to 20 minutes each day.  No episodes of chest pain.  Has occasional swelling in rignht ankle but this is related to SVG harvest  Recent lipid panel from May, 2019 all look good.  Total cholesterol is 100.  HDL is 36.  The LDL is 50.  The triglyceride level is 62. Does not take NTG   Aug. 25, 2020 :  Ervie is seen today for follow up visit Hx of CABG in 1998 and then again in 2008 Doing well.  No cp or dyspnea . Still puts in a garden.   Raises cows.  Limited by a right shoulder injury .   June 04, 2020: Keny is seen today for a follow-up visit.  He has a history of coronary artery disease and coronary artery bypass grafting x2. No cp,  No dsypnea.   Aug. 19, 2022: Jaimes is seen today for follow up of his CAD and CABG. No CP ,   walks on treadmill on occasion   Lipids from Aug. 16, show elevated glucose  Lipids look good   Current Outpatient Medications on File Prior to Visit  Medication Sig Dispense Refill   amLODipine (NORVASC) 5 MG tablet Take 1 tablet (5 mg total) by mouth daily. 90 tablet 3   aspirin 81 MG tablet Take 81 mg by mouth daily.     Cholecalciferol (VITAMIN D) 2000 units CAPS Take 1 capsule by mouth daily.     finasteride (PROSCAR) 5 MG tablet TAKE 1 TABLET(5 MG) BY MOUTH DAILY. 90 tablet 3   lansoprazole (PREVACID) 30 MG capsule TAKE 1 CAPSULE EVERY DAY 90 capsule 3   metoprolol succinate (TOPROL-XL) 25 MG 24 hr tablet TAKE 1/2 TABLET EVERY DAY 45 tablet 3   Multiple Vitamin (MULTIVITAMIN) tablet Take 1 tablet by mouth daily.     nitroGLYCERIN (NITROSTAT) 0.4 MG SL tablet Place 1 tablet (0.4 mg total) under the tongue every 5 (five) minutes as needed. (Patient not taking: Reported on 04/24/2021) 75 tablet 3   Omega-3 Fatty Acids (FISH OIL) 1000 MG CPDR Take 1,000 mg by mouth every morning.      simvastatin (ZOCOR) 10 MG tablet TAKE 1 TABLET(10 MG) BY MOUTH AT BEDTIME 90 tablet 3   tamsulosin (FLOMAX) 0.4 MG CAPS capsule TAKE 1 CAPSULE (0.4 MG TOTAL) BY MOUTH DAILY. 90 capsule 1   Current Facility-Administered Medications on File Prior to Visit  Medication Dose Route Frequency Provider Last Rate Last Admin   0.9 %  sodium chloride infusion  500 mL Intravenous Once Armbruster, Carlota Raspberry, MD        Allergies  Allergen Reactions   Imdur [Isosorbide Mononitrate]     Unknown, per pt    Meloxicam Swelling    Facial swelling     Past Medical History:  Diagnosis Date   Allergy    grass etc.   Barrett's esophagus    BPH (benign prostatic hyperplasia)    Chest pain, atypical    Chronic kidney disease    kidney stones   Coronary artery disease    GERD (gastroesophageal reflux disease)    Barrett's   Hernia    Hyperlipidemia    Hypertension    Myocardial infarction ([place])    Thrombocytopenia ([place])    Ulcer 1960    Past Surgical History:  Procedure Laterality Date   CARDIAC CATHETERIZATION  02/14/2007   MITRAL REGURGITATION. LV NORMAL   CHOLECYSTECTOMY     COLONOSCOPY     CORONARY ARTERY BYPASS GRAFT     twice  2 by passes each time   INGUINAL HERNIA REPAIR     bilateral done twice   LITHOTRIPSY     UPPER GASTROINTESTINAL ENDOSCOPY      Social History   Tobacco Use  Smoking Status Never  Smokeless Tobacco Never    Social History   Substance and Sexual Activity  Alcohol Use No    Family History  Problem Relation Age of Onset   Stroke Father    Heart attack Mother    Heart attack Maternal Grandmother    Heart attack Maternal Grandfather    Heart disease Sister    Heart disease Brother    Heart disease Sister    Heart disease Brother    Prostate cancer Brother    Colon cancer Neg Hx    Esophageal cancer Neg Hx    Rectal cancer Neg Hx    Stomach cancer Neg Hx     Reviw of Systems:   Noted in current history.  Physical Exam: There were no vitals  taken for this visit.  GEN:  Well nourished, well developed in no acute distress  HEENT: Normal NECK: No JVD; No carotid bruits LYMPHATICS: No lymphadenopathy CARDIAC: RRR , no murmurs, rubs, gallops RESPIRATORY:  Clear to auscultation without rales, wheezing or rhonchi  ABDOMEN: Soft, non-tender, non-distended MUSCULOSKELETAL:  No edema; No deformity  SKIN: Warm and dry NEUROLOGIC:  Alert and oriented x 3   ECG:   June 02, 2021: Normal sinus rhythm at 70.  No ST or T wave abnormalities.     Assessment / Plan:    1. Coronary artery disease Breylen is doing well.  He is have any have episodes of chest pain or shortness of breath.   2. Hyperlipidemia-      recent lipids look great.  Continue current medications.   3. Carotid artery disease:   .  He has mild left carotid disease.  He is asymptomatic.  4.  Hyperglycemia: His last glucose was 190.  We will need to discuss this with his primary medical doctor.  His last several glucose levels have been elevated.  His hemoglobin A1c is elevated.  He likely has early diabetes.  I encouraged him to watch his diet.  Further recommendations per Dr. Dennard Schaumann.   Mertie Moores, MD  06/01/2021 9:05 PM    [facility] [place],  [place], [place]  [address] Pager [phone] Phone: [phone]; Fax: [phone]

## 2021-06-02 ENCOUNTER — Encounter: Payer: Self-pay | Admitting: Cardiovascular Disease

## 2021-06-02 ENCOUNTER — Other Ambulatory Visit: Payer: Self-pay

## 2021-06-02 ENCOUNTER — Ambulatory Visit: Payer: Medicare HMO | Admitting: Cardiovascular Disease

## 2021-06-02 VITALS — BP 114/60 | HR 70 | Ht 69.0 in | Wt 158.0 lb

## 2021-06-02 DIAGNOSIS — E785 Hyperlipidemia, unspecified: Secondary | ICD-10-CM | POA: Diagnosis not present

## 2021-06-02 DIAGNOSIS — I10 Essential (primary) hypertension: Secondary | ICD-10-CM | POA: Diagnosis not present

## 2021-06-02 DIAGNOSIS — I2510 Atherosclerotic heart disease of native coronary artery without angina pectoris: Secondary | ICD-10-CM | POA: Diagnosis not present

## 2021-06-02 DIAGNOSIS — I779 Disorder of arteries and arterioles, unspecified: Secondary | ICD-10-CM | POA: Diagnosis not present

## 2021-06-02 DIAGNOSIS — I6523 Occlusion and stenosis of bilateral carotid arteries: Secondary | ICD-10-CM | POA: Diagnosis not present

## 2021-06-02 NOTE — Patient Instructions (Signed)

## 2021-06-03 ENCOUNTER — Telehealth: Payer: Self-pay | Admitting: Cardiovascular Disease

## 2021-06-03 NOTE — Telephone Encounter (Signed)
Informed pt of results. Pt verbalized understanding. 

## 2021-06-03 NOTE — Telephone Encounter (Signed)
Left message to call back  

## 2021-06-03 NOTE — Telephone Encounter (Signed)
Patient is returning call.  °

## 2021-06-03 NOTE — Telephone Encounter (Signed)
Andrew [name] is returning Andrew [name]'s call in regards to his lab results.

## 2021-06-06 ENCOUNTER — Ambulatory Visit (INDEPENDENT_AMBULATORY_CARE_PROVIDER_SITE_OTHER): Payer: Medicare HMO | Admitting: Family Medicine

## 2021-06-06 ENCOUNTER — Other Ambulatory Visit: Payer: Self-pay

## 2021-06-06 ENCOUNTER — Encounter: Payer: Self-pay | Admitting: Family Medicine

## 2021-06-06 VITALS — BP 110/64 | HR 78 | Temp 98.3°F | Resp 16 | Ht 69.0 in | Wt 154.0 lb

## 2021-06-06 DIAGNOSIS — E118 Type 2 diabetes mellitus with unspecified complications: Secondary | ICD-10-CM | POA: Diagnosis not present

## 2021-06-06 MED ORDER — METFORMIN HCL ER 500 MG PO TB24: 1000.0000 mg | ORAL_TABLET | Freq: Every day | ORAL | 3 refills | Status: DC

## 2021-06-06 NOTE — Progress Notes (Signed)
Subjective:    Patient ID: Andrew [name], male    DOB: 06-02-1941, 80 y.o.   MRN: [id number]  04/24/21 Patient is here today to review his recent lab work.  His A1c rose from 7-7.3.  He admits to eating a lot of fruit, peanut butter, graham crackers, and an effort to try to gain weight.  He is also been drinking Ensure.  He has had severe diarrhea in the past so is hesitant to want a start metformin as I had suggested.  He would like to try to work on diet before making any medication changes.  Of note, his platelet count also dropped to 59 down from 200.  It was 68 earlier in the year.  The differential diagnosis from hematology was platelet clumping versus ITP.  Given the sudden frequent changes I favor platelet clumping.  He denies any bleeding or bruising.  At that time, my plan was: Diabetes is fairly controlled at 7.3.  Patient would like to reduce the carbohydrates in his diet before adding any medication.  We will recheck lab work in 3 to 4 months and at that time consider adding medication if still elevated.  I believe the low platelets is likely due to platelet clumping given the frequent changes in his platelet counts over the last few months.  Plan to simply repeat a CBC in 3 to 4 months unless the patient develops significant bleeding or bruising.  06/06/21 Wt Readings from Last 3 Encounters:  06/06/21 154 lb (69.9 kg)  06/02/21 158 lb (71.7 kg)  04/24/21 159 lb (72.1 kg)   Patient recently saw his cardiologist and random blood work at the cardiologist office showed glucose of 190.  He is here today to discuss this further.  At his last visit his A1c was 9.3 indicating an average sugar of 160.  At that point he elected to try lifestyle changes to bring his sugar down but despite changing his diet, his sugars are still running higher than goal.  He has a history of chronic diarrhea.  Therefore I was concerned about starting metformin.  Given his cardiovascular history I encouraged Jardiance  however he is concerned about the cost.  Given his age I do not think Glucotrol is a good option for him and I am concerned about Actos causing leg swelling in a patient with a history of heart disease.  Past Medical History:  Diagnosis Date   Allergy    grass etc.   Barrett's esophagus    BPH (benign prostatic hyperplasia)    Chest pain, atypical    Chronic kidney disease    kidney stones   Coronary artery disease    GERD (gastroesophageal reflux disease)    Barrett's   Hernia    Hyperlipidemia    Hypertension    Myocardial infarction ([place])    Thrombocytopenia ([place])    Ulcer 1960   Past Surgical History:  Procedure Laterality Date   CARDIAC CATHETERIZATION  02/14/2007   MITRAL REGURGITATION. LV NORMAL   CHOLECYSTECTOMY     COLONOSCOPY     CORONARY ARTERY BYPASS GRAFT     twice  2 by passes each time   INGUINAL HERNIA REPAIR     bilateral done twice   LITHOTRIPSY     UPPER GASTROINTESTINAL ENDOSCOPY     Current Outpatient Medications on File Prior to Visit  Medication Sig Dispense Refill   amLODipine (NORVASC) 5 MG tablet Take 1 tablet (5 mg total) by mouth daily. 90 tablet  3   aspirin 81 MG tablet Take 81 mg by mouth daily.     Cholecalciferol (VITAMIN D) 2000 units CAPS Take 1 capsule by mouth daily.     finasteride (PROSCAR) 5 MG tablet TAKE 1 TABLET(5 MG) BY MOUTH DAILY. 90 tablet 3   lansoprazole (PREVACID) 30 MG capsule TAKE 1 CAPSULE EVERY DAY 90 capsule 3   metoprolol succinate (TOPROL-XL) 25 MG 24 hr tablet TAKE 1/2 TABLET EVERY DAY 45 tablet 3   Multiple Vitamin (MULTIVITAMIN) tablet Take 1 tablet by mouth daily.     nitroGLYCERIN (NITROSTAT) 0.4 MG SL tablet Place 1 tablet (0.4 mg total) under the tongue every 5 (five) minutes as needed. 75 tablet 3   Omega-3 Fatty Acids (FISH OIL) 1000 MG CPDR Take 1,000 mg by mouth every morning.     simvastatin (ZOCOR) 10 MG tablet TAKE 1 TABLET(10 MG) BY MOUTH AT BEDTIME 90 tablet 3   tamsulosin (FLOMAX) 0.4 MG CAPS capsule  TAKE 1 CAPSULE (0.4 MG TOTAL) BY MOUTH DAILY. 90 capsule 1   Current Facility-Administered Medications on File Prior to Visit  Medication Dose Route Frequency Provider Last Rate Last Admin   0.9 %  sodium chloride infusion  500 mL Intravenous Once Armbruster, Carlota Raspberry, MD       Allergies  Allergen Reactions   Imdur [Isosorbide Mononitrate]     Unknown, per pt    Meloxicam Swelling    Facial swelling    Social History   Socioeconomic History   Marital status: Married    Spouse name: Not on file   Number of children: 2   Years of education: Not on file   Highest education level: Not on file  Occupational History   Occupation: Retired  Tobacco Use   Smoking status: Never   Smokeless tobacco: Never  Vaping Use   Vaping Use: Never used  Substance and Sexual Activity   Alcohol use: No   Drug use: No   Sexual activity: Not Currently  Other Topics Concern   Not on file  Social History Narrative   Not on file   Social Determinants of Health   Financial Resource Strain: Low Risk    Difficulty of Paying Living Expenses: Not hard at all  Food Insecurity: No Food Insecurity   Worried About Charity fundraiser in the Last Year: Never true   [place] in the Last Year: Never true  Transportation Needs: No Transportation Needs   Lack of Transportation (Medical): No   Lack of Transportation (Non-Medical): No  Physical Activity: Inactive   Days of Exercise per Week: 0 days   Minutes of Exercise per Session: 0 min  Stress: No Stress Concern Present   Feeling of Stress : Not at all  Social Connections: Moderately Integrated   Frequency of Communication with Friends and Family: More than three times a week   Frequency of Social Gatherings with Friends and Family: Three times a week   Attends Religious Services: 1 to 4 times per year   Active Member of Clubs or Organizations: No   Attends Archivist Meetings: Never   Marital Status: Married  Human resources officer  Violence: Not At Risk   Fear of Current or Ex-Partner: No   Emotionally Abused: No   Physically Abused: No   Sexually Abused: No      Review of Systems  All other systems reviewed and are negative.     Objective:   Physical Exam Vitals reviewed.  Constitutional:      Appearance: Normal appearance.  Cardiovascular:     Rate and Rhythm: Normal rate and regular rhythm.     Heart sounds: Normal heart sounds. No murmur heard.   No friction rub. No gallop.  Pulmonary:     Effort: Pulmonary effort is normal. No respiratory distress.     Breath sounds: Normal breath sounds. No wheezing, rhonchi or rales.  Abdominal:     General: Abdomen is flat. Bowel sounds are normal.     Palpations: Abdomen is soft.  Musculoskeletal:     Right lower leg: No edema.     Left lower leg: No edema.  Neurological:     Mental Status: He is alert.          Assessment & Plan:  Controlled type 2 diabetes mellitus with complication, without long-term current use of insulin (HCC) Start metformin extended release 1000 mg p.o. every morning.  Monitor for severe diarrhea.  I recommended the patient liberalize his diet and try to eat more proteins as he is losing weight.  I believe he is try to cut out his carbs to lower his sugar and this is causing some weight loss.  I do not want him to go crazy with his carbohydrates but I do think if he ate a high-protein diet, he would have more energy and that would help curtail some of the weight loss.  Recheck A1c in October

## 2021-06-18 NOTE — Progress Notes (Signed)
Chronic Care Management Pharmacy Note  06/19/2021 Name:  Andrew [name] MRN:  [id number] DOB:  [birth year]  Subjective: Andrew [name] is an 80 y.o. year old male who is a primary patient of Pickard, Cammie Mcgee, MD.  The CCM team was consulted for assistance with disease management and care coordination needs.    Engaged with patient by telephone for follow up visit in response to provider referral for pharmacy case management and/or care coordination services.   Consent to Services:  The patient was given the following information about Chronic Care Management services today, agreed to services, and gave verbal consent: 1. CCM service includes personalized support from designated clinical staff supervised by the primary care provider, including individualized plan of care and coordination with other care providers 2. 24/7 contact phone numbers for assistance for urgent and routine care needs. 3. Service will only be billed when office clinical staff spend 20 minutes or more in a month to coordinate care. 4. Only one practitioner may furnish and bill the service in a calendar month. 5.The patient may stop CCM services at any time (effective at the end of the month) by phone call to the office staff. 6. The patient will be responsible for cost sharing (co-pay) of up to 20% of the service fee (after annual deductible is met). Patient agreed to services and consent obtained.  Patient Care Team: Susy Frizzle, MD as PCP - General (Family Medicine) Nahser, Wonda Cheng, MD as PCP - Cardiology (Cardiology) Edythe Clarity, [facility] as Pharmacist (Pharmacist)  Recent office visits: 06/06/21 Dennard Schaumann) - started on Metformin XR $RemoveBefo'1000mg'kcYJAPDBBMn$  every morning, recheck A1c in October 01/10/21 (Pickard) - L sided lower back pain.  BP was elevated and amlodipine $RemoveBefore'5mg'MtpVWqDMRTnnW$  was added to medication.  12/12/20 (Pickard) - BP was elevated patient is to record readings and report back in one week.   Recent consult visits: None  since last CCM contact  Hospital visits: None in previous 6 months  Objective:  Lab Results  Component Value Date   CREATININE 0.83 05/27/2021   BUN 19 05/27/2021   GFR 91.76 07/08/2015   GFRNONAA 82 04/09/2021   GFRAA 95 04/09/2021   NA 138 05/27/2021   K 4.1 05/27/2021   CALCIUM 9.0 05/27/2021   CO2 23 05/27/2021   GLUCOSE 190 (H) 05/27/2021    Lab Results  Component Value Date/Time   HGBA1C 7.3 (H) 04/09/2021 08:05 AM   HGBA1C 7.0 (H) 12/09/2020 08:45 AM   GFR 91.76 07/08/2015 07:39 AM   GFR 86.83 11/11/2011 08:49 AM   MICROALBUR 0.9 08/27/2020 08:59 AM   MICROALBUR 0.6 02/15/2020 08:45 AM    Last diabetic Eye exam: No results found for: HMDIABEYEEXA  Last diabetic Foot exam: No results found for: HMDIABFOOTEX   Lab Results  Component Value Date   CHOL 114 05/27/2021   HDL 41 05/27/2021   LDLCALC 61 05/27/2021   TRIG 54 05/27/2021   CHOLHDL 2.8 05/27/2021    Hepatic Function Latest Ref Rng & Units 05/27/2021 04/09/2021 12/09/2020  Total Protein 6.0 - 8.5 g/dL 6.1 6.2 5.9(L)  Albumin 3.6 - 4.6 g/dL 4.2 - -  AST 0 - 40 IU/L $Remov'17 14 14  'qXQTrR$ ALT 0 - 44 IU/L $Remov'15 10 11  'kmxsjd$ Alk Phosphatase 44 - 121 IU/L 69 - -  Total Bilirubin 0.0 - 1.2 mg/dL 0.4 0.8 0.6  Bilirubin, Direct 0.00 - 0.40 mg/dL 0.14 - -    Lab Results  Component Value Date/Time   TSH 2.14  07/15/2018 08:48 AM   TSH 2.323 09/16/2015 08:32 AM    CBC Latest Ref Rng & Units 04/09/2021 03/18/2021 12/09/2020  WBC 3.8 - 10.8 Thousand/uL 6.4 8.3 7.4  Hemoglobin 13.2 - 17.1 g/dL 13.7 13.6 14.3  Hematocrit 38.5 - 50.0 % 40.7 39.9 42.1  Platelets 140 - 400 Thousand/uL 59(L) 201 68(L)    Lab Results  Component Value Date/Time   VD25OH 52.05 02/29/2020 03:54 PM    Clinical ASCVD: No  The ASCVD Risk score (Arnett DK, et al., 2019) failed to calculate for the following reasons:   The 2019 ASCVD risk score is only valid for ages 13 to 12   The patient has a prior MI or stroke diagnosis    Depression screen [facility] 2/9  08/27/2020 02/15/2020 12/31/2017  Decreased Interest 0 0 0  Down, Depressed, Hopeless 0 0 0  PHQ - 2 Score 0 0 0     Social History   Tobacco Use  Smoking Status Never  Smokeless Tobacco Never   BP Readings from Last 3 Encounters:  06/06/21 110/64  06/02/21 114/60  04/24/21 110/66   Pulse Readings from Last 3 Encounters:  06/06/21 78  06/02/21 70  04/24/21 64   Wt Readings from Last 3 Encounters:  06/06/21 154 lb (69.9 kg)  06/02/21 158 lb (71.7 kg)  04/24/21 159 lb (72.1 kg)   BMI Readings from Last 3 Encounters:  06/06/21 22.74 kg/m  06/02/21 23.33 kg/m  04/24/21 23.48 kg/m    Assessment/Interventions: Review of patient past medical history, allergies, medications, health status, including review of consultants reports, laboratory and other test data, was performed as part of comprehensive evaluation and provision of chronic care management services.   SDOH:  (Social Determinants of Health) assessments and interventions performed: Yes   Financial Resource Strain: Low Risk    Difficulty of Paying Living Expenses: Not hard at all    SDOH Screenings   Alcohol Screen: Low Risk    Last Alcohol Screening Score (AUDIT): 0  Depression (PHQ2-9): Low Risk    PHQ-2 Score: 0  Financial Resource Strain: Low Risk    Difficulty of Paying Living Expenses: Not hard at all  Food Insecurity: No Food Insecurity   Worried About Charity fundraiser in the Last Year: Never true   Ran Out of Food in the Last Year: Never true  Housing: Low Risk    Last Housing Risk Score: 0  Physical Activity: Inactive   Days of Exercise per Week: 0 days   Minutes of Exercise per Session: 0 min  Social Connections: Moderately Integrated   Frequency of Communication with Friends and Family: More than three times a week   Frequency of Social Gatherings with Friends and Family: Three times a week   Attends Religious Services: 1 to 4 times per year   Active Member of Clubs or Organizations: No    Attends Archivist Meetings: Never   Marital Status: Married  Stress: No Stress Concern Present   Feeling of Stress : Not at all  Tobacco Use: Low Risk    Smoking Tobacco Use: Never   Smokeless Tobacco Use: Never  Transportation Needs: No Transportation Needs   Lack of Transportation (Medical): No   Lack of Transportation (Non-Medical): No    CCM Care Plan  Allergies  Allergen Reactions   Imdur [Isosorbide Mononitrate]     Unknown, per pt    Meloxicam Swelling    Facial swelling     Medications Reviewed Today  Reviewed by Edythe Clarity, RPH (Pharmacist) on 06/19/21 at 1137  Med List Status: <None>   Medication Order Taking? Sig Documenting Provider Last Dose Status Informant  0.9 %  sodium chloride infusion [id number]   Armbruster, Carlota Raspberry, MD  Active   amLODipine (NORVASC) 5 MG tablet [id number] Yes Take 1 tablet (5 mg total) by mouth daily. Susy Frizzle, MD Taking Active   aspirin 81 MG tablet [id number] Yes Take 81 mg by mouth daily. [provider] Taking Active Spouse/Significant Other  Cholecalciferol (VITAMIN D) 2000 units CAPS [id number] Yes Take 1 capsule by mouth daily. [provider] Taking Active Spouse/Significant Other  finasteride (PROSCAR) 5 MG tablet [id number] Yes TAKE 1 TABLET(5 MG) BY MOUTH DAILY. Susy Frizzle, MD Taking Active   lansoprazole (PREVACID) 30 MG capsule [id number] Yes TAKE 1 CAPSULE EVERY DAY Susy Frizzle, MD Taking Active   metFORMIN (GLUCOPHAGE XR) 500 MG 24 hr tablet [id number] Yes Take 2 tablets (1,000 mg total) by mouth daily with breakfast. Susy Frizzle, MD Taking Active   metoprolol succinate (TOPROL-XL) 25 MG 24 hr tablet [id number] Yes TAKE 1/2 TABLET EVERY DAY Susy Frizzle, MD Taking Active   Multiple Vitamin (MULTIVITAMIN) tablet [id number] Yes Take 1 tablet by mouth daily. [provider] Taking Active Spouse/Significant Other  nitroGLYCERIN (NITROSTAT) 0.4 MG SL tablet  [id number] Yes Place 1 tablet (0.4 mg total) under the tongue every 5 (five) minutes as needed. Susy Frizzle, MD Taking Active   Omega-3 Fatty Acids (FISH OIL) 1000 MG CPDR [id number] Yes Take 1,000 mg by mouth every morning. [provider] Taking Active Spouse/Significant Other  simvastatin (ZOCOR) 10 MG tablet [id number] Yes TAKE 1 TABLET(10 MG) BY MOUTH AT BEDTIME Susy Frizzle, MD Taking Active   tamsulosin (FLOMAX) 0.4 MG CAPS capsule [id number] Yes TAKE 1 CAPSULE (0.4 MG TOTAL) BY MOUTH DAILY. Alycia Rossetti, MD Taking Active             Patient Active Problem List   Diagnosis Date Noted   Thrombocytopenia ([place])    Bilateral carotid artery disease ([place]) 08/04/2016   Poor posture 02/22/2014   Stiffness of joints, not elsewhere classified, multiple sites 02/22/2014   Prediabetes 01/05/2013   Special screening for malignant neoplasms, colon 04/27/2011   Benign neoplasm of colon 04/27/2011   GERD (gastroesophageal reflux disease) 04/27/2011   Diverticulosis of colon (without mention of hemorrhage) 04/27/2011   CAD (coronary artery disease) 12/27/2010   Environmental allergies 12/27/2010   Insomnia 12/27/2010   H. pylori infection 12/27/2010   Barrett's esophagus 12/27/2010   Hyperlipidemia 11/08/2008   HTN (hypertension) 11/08/2008   GERD 11/08/2008   ACUT PEPTC ULCR UNS SITE W/HEM W/O MENTION OBST 11/08/2008   RENAL CALCULUS 11/08/2008   Obstructive sleep apnea 04/12/2003    Immunization History  Administered Date(s) Administered   Fluad Quad(high Dose 65+) 06/30/2019, 06/18/2020   Influenza Split 07/07/2012   Influenza Whole 07/18/2010   Influenza, High Dose Seasonal PF 08/16/2017, 09/07/2018   Influenza,inj,Quad PF,6+ Mos 07/24/2013, 09/10/2014, 08/30/2015, 08/21/2016   Moderna Sars-Covid-2 Vaccination 12/20/2019, 01/17/2020, 10/18/2020   Pneumococcal Conjugate-13 09/10/2014   Pneumococcal Polysaccharide-23 08/16/2007   Td 07/30/2003   Zoster, Live  06/11/2006    Conditions to be addressed/monitored:  HTN, CAD, Barrett's Esophagus, HLD, Pre-DM.    Care Plan : General Pharmacy (Adult)  Updates made by Edythe Clarity, RPH since 06/19/2021 12:00 AM     Problem: HTN, CAD, Barrett's Esophagus, HLD, Pre-DM  Priority: High  Onset Date: 02/06/2021     Long-Range Goal: Patient-Specific Goal   Start Date: 02/06/2021  Expected End Date: 08/08/2021  Recent Progress: On track  Priority: High  Note:   Current Barriers:  Unable to independently monitor therapeutic efficacy Unable to achieve control of BP.   Pharmacist Clinical Goal(s):  Patient will achieve adherence to monitoring guidelines and medication adherence to achieve therapeutic efficacy achieve control of BP as evidenced by home monitoring adhere to prescribed medication regimen as evidenced by fill date/pill box contact provider office for questions/concerns as evidenced notation of same in electronic health record through collaboration with PharmD and provider.   Interventions: 1:1 collaboration with Susy Frizzle, MD regarding development and update of comprehensive plan of care as evidenced by provider attestation and co-signature Inter-disciplinary care team collaboration (see longitudinal plan of care) Comprehensive medication review performed; medication list updated in electronic medical record  Hypertension (BP goal <140/90) -Controlled -Current treatment: Amlodipine 5mg  daily Metoprolol XL 25mg  daily -Medications previously tried: none noted  -Current home readings: none, patient not checking at home -Current dietary habits: he eats a diabetic conscious diet, watches carbs and sweets -Current exercise habits: active outside with yardwork/gardening -Reports hypotensive/hypertensive symptoms - patient reports feeling very tired often -Educated on BP goals and benefits of medications for prevention of heart attack, stroke and kidney damage; Daily salt  intake goal < 2300 mg; Importance of home blood pressure monitoring;  -He denies any swelling -Counseled to monitor BP at home 2 to 3 times per week, document, and provide log at future appointments -Recommended to continue current medication Recommended he monitor BP a few times per week, follow up plan initiated to follow up on readings.  Update 06/19/21 Not checking BP at home. Denies dizziness or Has Continue current meds for now  Hyperlipidemia: (LDL goal < 100) -Controlled -Current treatment: Simvastatin 10mg  -Medications previously tried: none noted  -Current dietary patterns: see above -Current exercise habits: see above -Educated on Cholesterol goals;  Benefits of statin for ASCVD risk reduction; Importance of limiting foods high in cholesterol; -Recommended to continue current medication  Diabetes (A1c goal <7%) -Controlled -Current medications: Metformin XR 500mg  two tablets daily with breakfast -Medications previously tried: none noted -Current home glucose readings fasting glucose: N/A not checking currently post prandial glucose: N/A not checking currently -Denies hypoglycemic/hyperglycemic symptoms -Current meal patterns: "careful with what he eats", watched carbohydrates and sweets.  Does report he eats a lot of peanut butter.  Currently uses JIF low fat which has 15g of carbs per serving.  Recommended he switch to JIF natural which has about 8g of carbs per serving.  Patient agreeable to plan. -Current exercise: outside working around house -Educated on A1c and blood sugar goals; Complications of diabetes including kidney damage, retinal damage, and cardiovascular disease; Prevention and management of hypoglycemic episodes; Benefits of routine self-monitoring of blood sugar; Continuous glucose monitoring; Carbohydrate content of his PB -Counseled to check feet daily and get yearly eye exams -Recommended to continue current medication Recommended dietary  changes to cut back on carbs, follow up A1c in early June.  Update 06/19/21 Patient recently started on Metformin XR 1000mg  daily due to elvated A1c in June.  He started with one tablet daily and titrated up to the full dose.  He denies any GI side effects.  He is not checking his glucose at home and has labs scheduled for October for f/u A1c.  We discussed ways to cut back on carbohydrates to  get A1c down.  Continue meds for now - recheck A1c October. Consider meter if A1c still elevated.   Patient Goals/Self-Care Activities Patient will:  - take medications as prescribed focus on medication adherence by pill count check blood pressure a few times per weekl, document, and provide at future appointments engage in dietary modifications by limiting consumption of carbohydrates  Follow Up Plan: The care management team will reach out to the patient again over the next 90 days.        Medication Assistance: None required.  Patient affirms current coverage meets needs.  Patient's preferred pharmacy is:  [facility] [phone] - [place], [place] AT [place] [address] [place] [address] Phone: [phone] Fax: [phone]  [facility] ([facility] - [address], [place] [address] Phone: [phone] Fax: [phone]  Uses pill box? Yes Pt endorses 100% compliance  We discussed: Benefits of medication synchronization, packaging and delivery as well as enhanced pharmacist oversight with [employer]. Patient decided to: Continue current medication management strategy  Care Plan and Follow Up Patient Decision:  Patient agrees to Care Plan and Follow-up.  Plan: The care management team will reach out to the patient again over the next 90 days.  Beverly Milch, PharmD Clinical Pharmacist [facility] [phone]

## 2021-06-19 ENCOUNTER — Ambulatory Visit (INDEPENDENT_AMBULATORY_CARE_PROVIDER_SITE_OTHER): Payer: Medicare HMO | Admitting: Pharmacist

## 2021-06-19 DIAGNOSIS — E118 Type 2 diabetes mellitus with unspecified complications: Secondary | ICD-10-CM

## 2021-06-19 DIAGNOSIS — I10 Essential (primary) hypertension: Secondary | ICD-10-CM

## 2021-06-19 NOTE — Patient Instructions (Addendum)
Visit Information   Goals Addressed             This Visit's Progress    Track and Manage My Blood Pressure-Hypertension   On track    Timeframe:  Long-Range Goal Priority:  High Start Date:      02/06/21                       Expected End Date:   08/08/21                    Follow Up Date 05/08/21   - check blood pressure 3 times per week - choose a place to take my blood pressure (home, clinic or office, retail store) - write blood pressure results in a log or diary    Why is this important?   You won't feel high blood pressure, but it can still hurt your blood vessels.  High blood pressure can cause heart or kidney problems. It can also cause a stroke.  Making lifestyle changes like losing a little weight or eating less salt will help.  Checking your blood pressure at home and at different times of the day can help to control blood pressure.  If the doctor prescribes medicine remember to take it the way the doctor ordered.  Call the office if you cannot afford the medicine or if there are questions about it.     Notes:        Patient Care Plan: General Pharmacy (Adult)     Problem Identified: HTN, CAD, Barrett's Esophagus, HLD, Pre-DM   Priority: High  Onset Date: 02/06/2021     Long-Range Goal: Patient-Specific Goal   Start Date: 02/06/2021  Expected End Date: 08/08/2021  Recent Progress: On track  Priority: High  Note:   Current Barriers:  Unable to independently monitor therapeutic efficacy Unable to achieve control of BP.   Pharmacist Clinical Goal(s):  Patient will achieve adherence to monitoring guidelines and medication adherence to achieve therapeutic efficacy achieve control of BP as evidenced by home monitoring adhere to prescribed medication regimen as evidenced by fill date/pill box contact provider office for questions/concerns as evidenced notation of same in electronic health record through collaboration with PharmD and provider.    Interventions: 1:1 collaboration with Susy Frizzle, MD regarding development and update of comprehensive plan of care as evidenced by provider attestation and co-signature Inter-disciplinary care team collaboration (see longitudinal plan of care) Comprehensive medication review performed; medication list updated in electronic medical record  Hypertension (BP goal <140/90) -Controlled -Current treatment: Amlodipine '5mg'$  daily Metoprolol XL '25mg'$  daily -Medications previously tried: none noted  -Current home readings: none, patient not checking at home -Current dietary habits: he eats a diabetic conscious diet, watches carbs and sweets -Current exercise habits: active outside with yardwork/gardening -Reports hypotensive/hypertensive symptoms - patient reports feeling very tired often -Educated on BP goals and benefits of medications for prevention of heart attack, stroke and kidney damage; Daily salt intake goal < 2300 mg; Importance of home blood pressure monitoring;  -He denies any swelling -Counseled to monitor BP at home 2 to 3 times per week, document, and provide log at future appointments -Recommended to continue current medication Recommended he monitor BP a few times per week, follow up plan initiated to follow up on readings.  Update 06/19/21 Not checking BP at home. Denies dizziness or Has Continue current meds for now  Hyperlipidemia: (LDL goal < 100) -Controlled -Current treatment: Simvastatin '10mg'$  -Medications  previously tried: none noted  -Current dietary patterns: see above -Current exercise habits: see above -Educated on Cholesterol goals;  Benefits of statin for ASCVD risk reduction; Importance of limiting foods high in cholesterol; -Recommended to continue current medication  Diabetes (A1c goal <7%) -Controlled -Current medications: Metformin XR '500mg'$  two tablets daily with breakfast -Medications previously tried: none noted -Current home glucose  readings fasting glucose: N/A not checking currently post prandial glucose: N/A not checking currently -Denies hypoglycemic/hyperglycemic symptoms -Current meal patterns: "careful with what he eats", watched carbohydrates and sweets.  Does report he eats a lot of peanut butter.  Currently uses JIF low fat which has 15g of carbs per serving.  Recommended he switch to JIF natural which has about 8g of carbs per serving.  Patient agreeable to plan. -Current exercise: outside working around house -Educated on A1c and blood sugar goals; Complications of diabetes including kidney damage, retinal damage, and cardiovascular disease; Prevention and management of hypoglycemic episodes; Benefits of routine self-monitoring of blood sugar; Continuous glucose monitoring; Carbohydrate content of his PB -Counseled to check feet daily and get yearly eye exams -Recommended to continue current medication Recommended dietary changes to cut back on carbs, follow up A1c in early June.  Update 06/19/21 Patient recently started on Metformin XR '1000mg'$  daily due to elvated A1c in June.  He started with one tablet daily and titrated up to the full dose.  He denies any GI side effects.  He is not checking his glucose at home and has labs scheduled for October for f/u A1c.  We discussed ways to cut back on carbohydrates to get A1c down.  Continue meds for now - recheck A1c October. Consider meter if A1c still elevated.   Patient Goals/Self-Care Activities Patient will:  - take medications as prescribed focus on medication adherence by pill count check blood pressure a few times per weekl, document, and provide at future appointments engage in dietary modifications by limiting consumption of carbohydrates  Follow Up Plan: The care management team will reach out to the patient again over the next 90 days.       Patient verbalizes understanding of instructions provided today and agrees to view in [place].   Telephone follow up appointment with pharmacy team member scheduled for: 4 months  Edythe Clarity, [place]

## 2021-07-08 ENCOUNTER — Telehealth: Payer: Self-pay | Admitting: Pharmacist

## 2021-07-08 NOTE — Progress Notes (Signed)
ERROR

## 2021-07-11 ENCOUNTER — Telehealth: Payer: Self-pay | Admitting: Family Medicine

## 2021-07-11 DIAGNOSIS — I10 Essential (primary) hypertension: Secondary | ICD-10-CM

## 2021-07-11 DIAGNOSIS — E118 Type 2 diabetes mellitus with unspecified complications: Secondary | ICD-10-CM | POA: Diagnosis not present

## 2021-07-11 NOTE — Telephone Encounter (Addendum)
Pharmacy:[facility]  Medication:accu-chek guide me glucose mtr,  Accu-chek guide test strips  accu-chek softclix lancets BD Single Use Swab

## 2021-07-15 MED ORDER — BD SWAB SINGLE USE REGULAR PADS: MEDICATED_PAD | 1 refills | Status: DC

## 2021-07-15 MED ORDER — ACCU-CHEK SOFTCLIX LANCETS MISC: 1 refills | Status: DC

## 2021-07-15 MED ORDER — ACCU-CHEK GUIDE ME W/DEVICE KIT: PACK | 1 refills | Status: DC

## 2021-07-15 MED ORDER — ACCU-CHEK GUIDE VI STRP: ORAL_STRIP | 1 refills | Status: DC

## 2021-07-15 NOTE — Telephone Encounter (Signed)
Prescription sent to pharmacy.

## 2021-07-17 ENCOUNTER — Ambulatory Visit: Payer: Medicare HMO | Admitting: Nurse Practitioner

## 2021-07-18 ENCOUNTER — Ambulatory Visit (INDEPENDENT_AMBULATORY_CARE_PROVIDER_SITE_OTHER): Payer: Medicare HMO | Admitting: Family Medicine

## 2021-07-18 ENCOUNTER — Other Ambulatory Visit: Payer: Self-pay

## 2021-07-18 VITALS — BP 138/66 | HR 72 | Temp 98.3°F | Resp 15 | Wt 152.8 lb

## 2021-07-18 DIAGNOSIS — M65351 Trigger finger, right little finger: Secondary | ICD-10-CM | POA: Diagnosis not present

## 2021-07-18 DIAGNOSIS — M722 Plantar fascial fibromatosis: Secondary | ICD-10-CM | POA: Diagnosis not present

## 2021-07-18 NOTE — Progress Notes (Signed)
Subjective:    Patient ID: Andrew [name], male    DOB: 02-12-1941, 80 y.o.   MRN: [id number]  Patient presents today with 2 complaints.  First he reports pain in his left heel.  It feels like someone stabbing him in the foot every morning when he tries to get up and walk.  It is a sharp pain at the insertion of the plantar fascia on his left calcaneus.  It gets better as he stands and walks throughout the day.  Is been there for about 2 weeks.  He denies any falls or injuries.  The second issue is a trigger finger that he has developed at the fifth MCP joint on his right hand.  Whenever he flexes the fifth MCP joint, it will get locked and he will manually have to straighten his finger.  Past Medical History:  Diagnosis Date   Allergy    grass etc.   Barrett's esophagus    BPH (benign prostatic hyperplasia)    Chest pain, atypical    Chronic kidney disease    kidney stones   Coronary artery disease    GERD (gastroesophageal reflux disease)    Barrett's   Hernia    Hyperlipidemia    Hypertension    Myocardial infarction ([place])    Thrombocytopenia ([place])    Ulcer 1960   Past Surgical History:  Procedure Laterality Date   CARDIAC CATHETERIZATION  02/14/2007   MITRAL REGURGITATION. LV NORMAL   CHOLECYSTECTOMY     COLONOSCOPY     CORONARY ARTERY BYPASS GRAFT     twice  2 by passes each time   INGUINAL HERNIA REPAIR     bilateral done twice   LITHOTRIPSY     UPPER GASTROINTESTINAL ENDOSCOPY     Current Outpatient Medications on File Prior to Visit  Medication Sig Dispense Refill   Accu-Chek Softclix Lancets lancets Use as instructed to monitor FSBS 1x daily. Dx: E11.9 100 each 1   Alcohol Swabs (B-D SINGLE USE SWABS REGULAR) PADS Use as instructed to monitor FSBS 1x daily. Dx: E11.9 100 each 1   amLODipine (NORVASC) 5 MG tablet Take 1 tablet (5 mg total) by mouth daily. 90 tablet 3   aspirin 81 MG tablet Take 81 mg by mouth daily.     Blood Glucose Monitoring Suppl (ACCU-CHEK  GUIDE ME) w/Device KIT Use as instructed to monitor FSBS 1x daily. Dx: E11.9 1 kit 1   Cholecalciferol (VITAMIN D) 2000 units CAPS Take 1 capsule by mouth daily.     finasteride (PROSCAR) 5 MG tablet TAKE 1 TABLET(5 MG) BY MOUTH DAILY. 90 tablet 3   glucose blood (ACCU-CHEK GUIDE) test strip Use as instructed to monitor FSBS 1x daily. Dx: E11.9 100 each 1   lansoprazole (PREVACID) 30 MG capsule TAKE 1 CAPSULE EVERY DAY 90 capsule 3   metFORMIN (GLUCOPHAGE XR) 500 MG 24 hr tablet Take 2 tablets (1,000 mg total) by mouth daily with breakfast. 60 tablet 3   metoprolol succinate (TOPROL-XL) 25 MG 24 hr tablet TAKE 1/2 TABLET EVERY DAY 45 tablet 3   Multiple Vitamin (MULTIVITAMIN) tablet Take 1 tablet by mouth daily.     nitroGLYCERIN (NITROSTAT) 0.4 MG SL tablet Place 1 tablet (0.4 mg total) under the tongue every 5 (five) minutes as needed. 75 tablet 3   Omega-3 Fatty Acids (FISH OIL) 1000 MG CPDR Take 1,000 mg by mouth every morning.     simvastatin (ZOCOR) 10 MG tablet TAKE 1 TABLET(10 MG) BY MOUTH  AT BEDTIME 90 tablet 3   tamsulosin (FLOMAX) 0.4 MG CAPS capsule TAKE 1 CAPSULE (0.4 MG TOTAL) BY MOUTH DAILY. 90 capsule 1   Current Facility-Administered Medications on File Prior to Visit  Medication Dose Route Frequency Provider Last Rate Last Admin   0.9 %  sodium chloride infusion  500 mL Intravenous Once Armbruster, Carlota Raspberry, MD       Allergies  Allergen Reactions   Imdur [Isosorbide Mononitrate]     Unknown, per pt    Meloxicam Swelling    Facial swelling    Social History   Socioeconomic History   Marital status: Married    Spouse name: Not on file   Number of children: 2   Years of education: Not on file   Highest education level: Not on file  Occupational History   Occupation: Retired  Tobacco Use   Smoking status: Never   Smokeless tobacco: Never  Vaping Use   Vaping Use: Never used  Substance and Sexual Activity   Alcohol use: No   Drug use: No   Sexual activity: Not  Currently  Other Topics Concern   Not on file  Social History Narrative   Not on file   Social Determinants of Health   Financial Resource Strain: Low Risk    Difficulty of Paying Living Expenses: Not hard at all  Food Insecurity: No Food Insecurity   Worried About Charity fundraiser in the Last Year: Never true   [place] in the Last Year: Never true  Transportation Needs: No Transportation Needs   Lack of Transportation (Medical): No   Lack of Transportation (Non-Medical): No  Physical Activity: Inactive   Days of Exercise per Week: 0 days   Minutes of Exercise per Session: 0 min  Stress: No Stress Concern Present   Feeling of Stress : Not at all  Social Connections: Moderately Integrated   Frequency of Communication with Friends and Family: More than three times a week   Frequency of Social Gatherings with Friends and Family: Three times a week   Attends Religious Services: 1 to 4 times per year   Active Member of Clubs or Organizations: No   Attends Archivist Meetings: Never   Marital Status: Married  Human resources officer Violence: Not At Risk   Fear of Current or Ex-Partner: No   Emotionally Abused: No   Physically Abused: No   Sexually Abused: No      Review of Systems  All other systems reviewed and are negative.     Objective:   Physical Exam Vitals reviewed.  Constitutional:      Appearance: Normal appearance.  Cardiovascular:     Rate and Rhythm: Normal rate and regular rhythm.     Heart sounds: Normal heart sounds. No murmur heard.   No friction rub. No gallop.  Pulmonary:     Effort: Pulmonary effort is normal. No respiratory distress.     Breath sounds: Normal breath sounds. No wheezing, rhonchi or rales.  Abdominal:     General: Abdomen is flat. Bowel sounds are normal.     Palpations: Abdomen is soft.  Musculoskeletal:     Right hand: Tenderness present. No deformity or bony tenderness. Decreased range of motion. Normal strength.  Normal sensation.       Feet:  Neurological:     Mental Status: He is alert.          Assessment & Plan:  Plantar fasciitis  Trigger little finger of  right hand We discussed treatment options including night splints for Planter fasciitis, stretches, cortisone shots.  The patient elects to try the night splint and stretching first.  Regarding the trigger finger, we discussed cortisone injections versus referral to hand specialty clinic.  Patient would like to try Voltaren gel 3-4 times a day for a month or so to see if it will help first.

## 2021-07-25 ENCOUNTER — Other Ambulatory Visit: Payer: Medicare HMO

## 2021-07-25 ENCOUNTER — Other Ambulatory Visit: Payer: Self-pay

## 2021-07-25 DIAGNOSIS — E118 Type 2 diabetes mellitus with unspecified complications: Secondary | ICD-10-CM

## 2021-07-25 DIAGNOSIS — E7800 Pure hypercholesterolemia, unspecified: Secondary | ICD-10-CM

## 2021-07-25 DIAGNOSIS — E785 Hyperlipidemia, unspecified: Secondary | ICD-10-CM

## 2021-07-25 DIAGNOSIS — I10 Essential (primary) hypertension: Secondary | ICD-10-CM | POA: Diagnosis not present

## 2021-07-25 DIAGNOSIS — I2510 Atherosclerotic heart disease of native coronary artery without angina pectoris: Secondary | ICD-10-CM | POA: Diagnosis not present

## 2021-07-25 DIAGNOSIS — Z13220 Encounter for screening for lipoid disorders: Secondary | ICD-10-CM | POA: Diagnosis not present

## 2021-07-25 DIAGNOSIS — Z136 Encounter for screening for cardiovascular disorders: Secondary | ICD-10-CM | POA: Diagnosis not present

## 2021-07-26 LAB — COMPLETE METABOLIC PANEL WITH GFR
AG Ratio: 2.3 (calc) (ref 1.0–2.5)
ALT: 11 U/L (ref 9–46)
AST: 15 U/L (ref 10–35)
Albumin: 4.1 g/dL (ref 3.6–5.1)
Alkaline phosphatase (APISO): 51 U/L (ref 35–144)
BUN: 18 mg/dL (ref 7–25)
CO2: 27 mmol/L (ref 20–32)
Calcium: 8.7 mg/dL (ref 8.6–10.3)
Chloride: 106 mmol/L (ref 98–110)
Creat: 0.75 mg/dL (ref 0.70–1.22)
Globulin: 1.8 g/dL (calc) — ABNORMAL LOW (ref 1.9–3.7)
Glucose, Bld: 144 mg/dL — ABNORMAL HIGH (ref 65–99)
Potassium: 4.1 mmol/L (ref 3.5–5.3)
Sodium: 141 mmol/L (ref 135–146)
Total Bilirubin: 0.5 mg/dL (ref 0.2–1.2)
Total Protein: 5.9 g/dL — ABNORMAL LOW (ref 6.1–8.1)
eGFR: 90 mL/min/{1.73_m2} (ref >=60)

## 2021-07-26 LAB — CBC WITH DIFFERENTIAL/PLATELET
Absolute Monocytes: 552 cells/uL (ref 200–950)
Basophils Absolute: 62 cells/uL (ref 0–200)
Basophils Relative: 0.9 %
Eosinophils Absolute: 131 cells/uL (ref 15–500)
Eosinophils Relative: 1.9 %
HCT: 40.9 % (ref 38.5–50.0)
Hemoglobin: 13.6 g/dL (ref 13.2–17.1)
Lymphs Abs: 1766 cells/uL (ref 850–3900)
MCH: 30.1 pg (ref 27.0–33.0)
MCHC: 33.3 g/dL (ref 32.0–36.0)
MCV: 90.5 fL (ref 80.0–100.0)
MPV: 11 fL (ref 7.5–12.5)
Monocytes Relative: 8 %
Neutro Abs: 4388 cells/uL (ref 1500–7800)
Neutrophils Relative %: 63.6 %
Platelets: 60 10*3/uL — ABNORMAL LOW (ref 140–400)
RBC: 4.52 10*6/uL (ref 4.20–5.80)
RDW: 12 % (ref 11.0–15.0)
Total Lymphocyte: 25.6 %
WBC: 6.9 10*3/uL (ref 3.8–10.8)

## 2021-07-26 LAB — LIPID PANEL
Cholesterol: 102 mg/dL (ref <200)
HDL: 41 mg/dL (ref >=40)
LDL Cholesterol (Calc): 48 mg/dL (calc)
Non-HDL Cholesterol (Calc): 61 mg/dL (calc) (ref <130)
Total CHOL/HDL Ratio: 2.5 (calc) (ref <5.0)
Triglycerides: 45 mg/dL (ref <150)

## 2021-07-26 LAB — HEMOGLOBIN A1C
Hgb A1c MFr Bld: 6.5 % of total Hgb — ABNORMAL HIGH (ref <5.7)
Mean Plasma Glucose: 140 mg/dL
eAG (mmol/L): 7.7 mmol/L

## 2021-07-31 ENCOUNTER — Telehealth: Payer: Self-pay

## 2021-07-31 ENCOUNTER — Encounter: Payer: Self-pay | Admitting: *Deleted

## 2021-07-31 NOTE — Telephone Encounter (Signed)
Call placed to patient and patient made aware of lab results.   Letter sent for patient records.

## 2021-07-31 NOTE — Telephone Encounter (Signed)
Call placed to pt, l/mess to return call 07/31/21

## 2021-07-31 NOTE — Telephone Encounter (Signed)
Pt called in returning a call  that he received about his lab results. Pt stated he wasn't sure of who may have called, but would like a call back about results. Please advise.  Cb#: [phone]

## 2021-08-07 ENCOUNTER — Ambulatory Visit (INDEPENDENT_AMBULATORY_CARE_PROVIDER_SITE_OTHER): Payer: Medicare HMO | Admitting: *Deleted

## 2021-08-07 ENCOUNTER — Other Ambulatory Visit: Payer: Self-pay

## 2021-08-07 ENCOUNTER — Other Ambulatory Visit: Payer: Medicare HMO

## 2021-08-07 DIAGNOSIS — Z23 Encounter for immunization: Secondary | ICD-10-CM

## 2021-08-13 ENCOUNTER — Telehealth: Payer: Self-pay | Admitting: Pharmacist

## 2021-08-13 DIAGNOSIS — E291 Testicular hypofunction: Secondary | ICD-10-CM | POA: Diagnosis not present

## 2021-08-13 DIAGNOSIS — N403 Nodular prostate with lower urinary tract symptoms: Secondary | ICD-10-CM | POA: Diagnosis not present

## 2021-08-13 DIAGNOSIS — R351 Nocturia: Secondary | ICD-10-CM | POA: Diagnosis not present

## 2021-08-13 NOTE — Progress Notes (Addendum)
Chronic Care Management Pharmacy Assistant   Name: Andrew [name]  MRN: [id number] DOB: September 17, 1941  Reason for Encounter: Disease State For DM.    Conditions to be addressed/monitored: HTN, CAD, Barrett's Esophagus, HLD, Pre-DM.  Recent office visits:  07/18/21 Dr. Dennard Schaumann For follow-up No medication changes.   Recent consult visits:  None since 06/19/21  Hospital visits:  None since 06/19/21  Medications: Outpatient Encounter Medications as of 08/13/2021  Medication Sig   Accu-Chek Softclix Lancets lancets Use as instructed to monitor FSBS 1x daily. Dx: E11.9   Alcohol Swabs (B-D SINGLE USE SWABS REGULAR) PADS Use as instructed to monitor FSBS 1x daily. Dx: E11.9   amLODipine (NORVASC) 5 MG tablet Take 1 tablet (5 mg total) by mouth daily.   aspirin 81 MG tablet Take 81 mg by mouth daily.   Blood Glucose Monitoring Suppl (ACCU-CHEK GUIDE ME) w/Device KIT Use as instructed to monitor FSBS 1x daily. Dx: E11.9   Cholecalciferol (VITAMIN D) 2000 units CAPS Take 1 capsule by mouth daily.   finasteride (PROSCAR) 5 MG tablet TAKE 1 TABLET(5 MG) BY MOUTH DAILY.   glucose blood (ACCU-CHEK GUIDE) test strip Use as instructed to monitor FSBS 1x daily. Dx: E11.9   lansoprazole (PREVACID) 30 MG capsule TAKE 1 CAPSULE EVERY DAY   metFORMIN (GLUCOPHAGE XR) 500 MG 24 hr tablet Take 2 tablets (1,000 mg total) by mouth daily with breakfast.   metoprolol succinate (TOPROL-XL) 25 MG 24 hr tablet TAKE 1/2 TABLET EVERY DAY   Multiple Vitamin (MULTIVITAMIN) tablet Take 1 tablet by mouth daily.   nitroGLYCERIN (NITROSTAT) 0.4 MG SL tablet Place 1 tablet (0.4 mg total) under the tongue every 5 (five) minutes as needed. (Patient not taking: Reported on 07/18/2021)   Omega-3 Fatty Acids (FISH OIL) 1000 MG CPDR Take 1,000 mg by mouth every morning.   simvastatin (ZOCOR) 10 MG tablet TAKE 1 TABLET(10 MG) BY MOUTH AT BEDTIME   tamsulosin (FLOMAX) 0.4 MG CAPS capsule TAKE 1 CAPSULE (0.4 MG TOTAL) BY MOUTH DAILY.    Facility-Administered Encounter Medications as of 08/13/2021  Medication   0.9 %  sodium chloride infusion   Recent Relevant Labs: Lab Results  Component Value Date/Time   HGBA1C 6.5 (H) 07/25/2021 08:04 AM   HGBA1C 7.3 (H) 04/09/2021 08:05 AM   MICROALBUR 0.9 08/27/2020 08:59 AM   MICROALBUR 0.6 02/15/2020 08:45 AM    Kidney Function Lab Results  Component Value Date/Time   CREATININE 0.75 07/25/2021 08:04 AM   CREATININE 0.83 05/27/2021 08:08 AM   CREATININE 0.84 04/09/2021 08:05 AM   GFR 91.76 07/08/2015 07:39 AM   GFRNONAA 82 04/09/2021 08:05 AM   GFRAA 95 04/09/2021 08:05 AM    Current antihyperglycemic regimen:  Metformin XR $RemoveBefo'500mg'[id number]$  two tablets daily with breakfast  What recent interventions/DTPs have been made to improve glycemic control:  None.   Have there been any recent hospitalizations or ED visits since last visit with CPP? Patient stated no.  Patient denies hypoglycemic symptoms, including None  Patient denies hyperglycemic symptoms, including fatigue and weakness  How often are you checking your blood sugar? Patient does not check his blood sugar at home.  What are your blood sugars ranging?  Patient does not check his blood sugar at home.   During the week, how often does your blood glucose drop below 70? Patient does not check his blood sugar at home.  Are you checking your feet daily/regularly?  Patient reminded to check his feet regulary.   Adherence  Review: Is the patient currently on a STATIN medication? Simvastatin 10 mg  Is the patient currently on ACE/ARB medication? N/A.  Does the patient have >5 day gap between last estimated fill dates? Per misc rpys, yes.  Care Gaps:Patient is due for his AWV(Scheduled)  Star Rating Drugs:Metformin 500 mg 06/06/21 90 DS, Simvastatin 10 mg 04/18/21 90 DS.(Per patient he does have this at home)  Follow-Up:Pharmacist Review  Charlann Lange, RMA Clinical Pharmacist Assistant [phone]  29  minutes spent in review, coordination, and documentation.  Contacted patient to discuss medication questions.  Reviewed most recent labs.  He is concerned about recent weight loss, however he has made dietary changes limiting sugars and started metformin.  I believe the both of these have contributed to weight loss and is evident by improved A1c that his glucose is more controlled as a result.  No changes at this time - have asked him to let me know if weight loss does continue and we can possibly check thyroid labs to rule out any thyroid dysfunction.  Reviewed by: Beverly Milch, PharmD Clinical Pharmacist [phone]

## 2021-08-25 DIAGNOSIS — R351 Nocturia: Secondary | ICD-10-CM | POA: Diagnosis not present

## 2021-08-25 DIAGNOSIS — N403 Nodular prostate with lower urinary tract symptoms: Secondary | ICD-10-CM | POA: Diagnosis not present

## 2021-08-25 DIAGNOSIS — E291 Testicular hypofunction: Secondary | ICD-10-CM | POA: Diagnosis not present

## 2021-08-28 ENCOUNTER — Other Ambulatory Visit: Payer: Self-pay

## 2021-08-28 ENCOUNTER — Ambulatory Visit: Payer: Medicare HMO | Admitting: Family Medicine

## 2021-09-17 ENCOUNTER — Other Ambulatory Visit: Payer: Self-pay

## 2021-09-17 ENCOUNTER — Inpatient Hospital Stay (HOSPITAL_COMMUNITY): Payer: Medicare HMO | Attending: Hematology

## 2021-09-17 DIAGNOSIS — D696 Thrombocytopenia, unspecified: Secondary | ICD-10-CM | POA: Insufficient documentation

## 2021-09-17 DIAGNOSIS — Z79899 Other long term (current) drug therapy: Secondary | ICD-10-CM | POA: Insufficient documentation

## 2021-09-17 LAB — CBC WITH DIFFERENTIAL/PLATELET
Abs Immature Granulocytes: 0.02 10*3/uL (ref 0.00–0.07)
Basophils Absolute: 0 10*3/uL (ref 0.0–0.1)
Basophils Relative: 0 %
Eosinophils Absolute: 0.1 10*3/uL (ref 0.0–0.5)
Eosinophils Relative: 1 %
HCT: 39.6 % (ref 39.0–52.0)
Hemoglobin: 13.9 g/dL (ref 13.0–17.0)
Immature Granulocytes: 0 %
Lymphocytes Relative: 21 %
Lymphs Abs: 1.4 10*3/uL (ref 0.7–4.0)
MCH: 31.7 pg (ref 26.0–34.0)
MCHC: 35.1 g/dL (ref 30.0–36.0)
MCV: 90.2 fL (ref 80.0–100.0)
Monocytes Absolute: 0.5 10*3/uL (ref 0.1–1.0)
Monocytes Relative: 8 %
Neutro Abs: 4.9 10*3/uL (ref 1.7–7.7)
Neutrophils Relative %: 70 %
Platelets: 178 10*3/uL (ref 150–400)
RBC: 4.39 MIL/uL (ref 4.22–5.81)
RDW: 12.4 % (ref 11.5–15.5)
WBC: 7 10*3/uL (ref 4.0–10.5)
nRBC: 0 % (ref 0.0–0.2)

## 2021-09-17 LAB — LACTATE DEHYDROGENASE: LDH: 113 U/L (ref 98–192)

## 2021-09-17 MED ORDER — NITROGLYCERIN 0.4 MG SL SUBL
0.4000 mg | SUBLINGUAL_TABLET | SUBLINGUAL | 3 refills | Status: DC | PRN
Start: 2021-09-17 — End: 2022-04-20

## 2021-09-18 DIAGNOSIS — X32XXXD Exposure to sunlight, subsequent encounter: Secondary | ICD-10-CM | POA: Diagnosis not present

## 2021-09-18 DIAGNOSIS — D225 Melanocytic nevi of trunk: Secondary | ICD-10-CM | POA: Diagnosis not present

## 2021-09-18 DIAGNOSIS — L821 Other seborrheic keratosis: Secondary | ICD-10-CM | POA: Diagnosis not present

## 2021-09-18 DIAGNOSIS — L570 Actinic keratosis: Secondary | ICD-10-CM | POA: Diagnosis not present

## 2021-09-18 DIAGNOSIS — D485 Neoplasm of uncertain behavior of skin: Secondary | ICD-10-CM | POA: Diagnosis not present

## 2021-09-23 NOTE — Progress Notes (Signed)
Andrew [name], [place] [address]   CLINIC:  Medical Oncology/Hematology  PCP:  Susy Frizzle, MD [address] [place] [address]  [phone]  REASON FOR VISIT:  Follow-up for thrombocytopenia  PRIOR THERAPY: none  CURRENT THERAPY: surveillance  INTERVAL HISTORY:  Andrew [name], a 80 y.o. male, returns for routine follow-up for his thrombocytopenia. Andrew [name] was last seen on 03/25/2021.  Today he reports feeling good. He denies recent infections, hematochezia, hematuria, and nosebleeds. He reports stable easy bruising on his hands. He continues to have dental issues.   REVIEW OF SYSTEMS:  Review of Systems  Constitutional:  Negative for appetite change (60%) and fatigue (60%).  HENT:   Positive for trouble swallowing (teeth). Negative for nosebleeds.   Gastrointestinal:  Negative for blood in stool.  Genitourinary:  Positive for frequency. Negative for hematuria.   Neurological:  Positive for headaches (allergies).  Hematological:  Bruises/bleeds easily (stable on hands).  Psychiatric/Behavioral:  Positive for sleep disturbance.   All other systems reviewed and are negative.  PAST MEDICAL/SURGICAL HISTORY:  Past Medical History:  Diagnosis Date   Allergy    grass etc.   Barrett's esophagus    BPH (benign prostatic hyperplasia)    Chest pain, atypical    Chronic kidney disease    kidney stones   Coronary artery disease    GERD (gastroesophageal reflux disease)    Barrett's   Hernia    Hyperlipidemia    Hypertension    Myocardial infarction ([place])    Thrombocytopenia ([place])    Ulcer 1960   Past Surgical History:  Procedure Laterality Date   CARDIAC CATHETERIZATION  02/14/2007   MITRAL REGURGITATION. LV NORMAL   CHOLECYSTECTOMY     COLONOSCOPY     CORONARY ARTERY BYPASS GRAFT     twice  2 by passes each time   INGUINAL HERNIA REPAIR     bilateral done twice   LITHOTRIPSY     UPPER GASTROINTESTINAL  ENDOSCOPY      SOCIAL HISTORY:  Social History   Socioeconomic History   Marital status: Married    Spouse name: Not on file   Number of children: 2   Years of education: Not on file   Highest education level: Not on file  Occupational History   Occupation: Retired  Tobacco Use   Smoking status: Never   Smokeless tobacco: Never  Vaping Use   Vaping Use: Never used  Substance and Sexual Activity   Alcohol use: No   Drug use: No   Sexual activity: Not Currently  Other Topics Concern   Not on file  Social History Narrative   Not on file   Social Determinants of Health   Financial Resource Strain: Not on file  Food Insecurity: Not on file  Transportation Needs: Not on file  Physical Activity: Not on file  Stress: Not on file  Social Connections: Not on file  Intimate Partner Violence: Not on file    FAMILY HISTORY:  Family History  Problem Relation Age of Onset   Stroke Father    Heart attack Mother    Heart attack Maternal Grandmother    Heart attack Maternal Grandfather    Heart disease Sister    Heart disease Brother    Heart disease Sister    Heart disease Brother    Prostate cancer Brother    Colon cancer Neg Hx    Esophageal cancer Neg Hx  Rectal cancer Neg Hx    Stomach cancer Neg Hx     CURRENT MEDICATIONS:  Current Outpatient Medications  Medication Sig Dispense Refill   Accu-Chek Softclix Lancets lancets Use as instructed to monitor FSBS 1x daily. Dx: E11.9 100 each 1   Alcohol Swabs (B-D SINGLE USE SWABS REGULAR) PADS Use as instructed to monitor FSBS 1x daily. Dx: E11.9 100 each 1   amLODipine (NORVASC) 5 MG tablet Take 1 tablet (5 mg total) by mouth daily. 90 tablet 3   aspirin 81 MG tablet Take 81 mg by mouth daily.     Blood Glucose Monitoring Suppl (ACCU-CHEK GUIDE ME) w/Device KIT Use as instructed to monitor FSBS 1x daily. Dx: E11.9 1 kit 1   Cholecalciferol (VITAMIN D) 2000 units CAPS Take 1 capsule by mouth daily.     finasteride  (PROSCAR) 5 MG tablet TAKE 1 TABLET(5 MG) BY MOUTH DAILY. 90 tablet 3   glucose blood (ACCU-CHEK GUIDE) test strip Use as instructed to monitor FSBS 1x daily. Dx: E11.9 100 each 1   lansoprazole (PREVACID) 30 MG capsule TAKE 1 CAPSULE EVERY DAY 90 capsule 3   metFORMIN (GLUCOPHAGE XR) 500 MG 24 hr tablet Take 2 tablets (1,000 mg total) by mouth daily with breakfast. 60 tablet 3   metoprolol succinate (TOPROL-XL) 25 MG 24 hr tablet TAKE 1/2 TABLET EVERY DAY 45 tablet 3   Multiple Vitamin (MULTIVITAMIN) tablet Take 1 tablet by mouth daily.     nitroGLYCERIN (NITROSTAT) 0.4 MG SL tablet Place 1 tablet (0.4 mg total) under the tongue every 5 (five) minutes as needed. 45 tablet 3   Omega-3 Fatty Acids (FISH OIL) 1000 MG CPDR Take 1,000 mg by mouth every morning.     simvastatin (ZOCOR) 10 MG tablet TAKE 1 TABLET(10 MG) BY MOUTH AT BEDTIME 90 tablet 3   tamsulosin (FLOMAX) 0.4 MG CAPS capsule TAKE 1 CAPSULE (0.4 MG TOTAL) BY MOUTH DAILY. 90 capsule 1   Current Facility-Administered Medications  Medication Dose Route Frequency Provider Last Rate Last Admin   0.9 %  sodium chloride infusion  500 mL Intravenous Once Armbruster, Carlota Raspberry, MD        ALLERGIES:  Allergies  Allergen Reactions   Imdur [Isosorbide Mononitrate]     Unknown, per pt    Meloxicam Swelling    Facial swelling     PHYSICAL EXAM:  Performance status (ECOG): 0 - Asymptomatic  There were no vitals filed for this visit. Wt Readings from Last 3 Encounters:  07/18/21 152 lb 12.8 oz (69.3 kg)  06/06/21 154 lb (69.9 kg)  06/02/21 158 lb (71.7 kg)   Physical Exam Vitals reviewed.  Constitutional:      Appearance: Normal appearance.  Cardiovascular:     Rate and Rhythm: Normal rate and regular rhythm.     Pulses: Normal pulses.     Heart sounds: Normal heart sounds.  Pulmonary:     Effort: Pulmonary effort is normal.     Breath sounds: Normal breath sounds.  Neurological:     General: No focal deficit present.      Mental Status: He is alert and oriented to person, place, and time.  Psychiatric:        Mood and Affect: Mood normal.        Behavior: Behavior normal.    LABORATORY DATA:  I have reviewed the labs as listed.  CBC Latest Ref Rng & Units 09/17/2021 07/25/2021 04/09/2021  WBC 4.0 - 10.5 K/uL 7.0 6.9 6.4  Hemoglobin 13.0 - 17.0 g/dL 13.9 13.6 13.7  Hematocrit 39.0 - 52.0 % 39.6 40.9 40.7  Platelets 150 - 400 K/uL 178 60(L) 59(L)   CMP Latest Ref Rng & Units 07/25/2021 05/27/2021 04/09/2021  Glucose 65 - 99 mg/dL 144(H) 190(H) 166(H)  BUN 7 - 25 mg/dL $Remove'18 19 21  'zFMoQMP$ Creatinine 0.70 - 1.22 mg/dL 0.75 0.83 0.84  Sodium 135 - 146 mmol/L 141 138 139  Potassium 3.5 - 5.3 mmol/L 4.1 4.1 3.8  Chloride 98 - 110 mmol/L 106 101 104  CO2 20 - 32 mmol/L $RemoveB'27 23 27  'iqTDyHtl$ Calcium 8.6 - 10.3 mg/dL 8.7 9.0 8.9  Total Protein 6.1 - 8.1 g/dL 5.9(L) 6.1 6.2  Total Bilirubin 0.2 - 1.2 mg/dL 0.5 0.4 0.8  Alkaline Phos 44 - 121 IU/L - 69 -  AST 10 - 35 U/L $Remo'15 17 14  'kVSZE$ ALT 9 - 46 U/L $Remo'11 15 10      'sjvPe$ Component Value Date/Time   RBC 4.39 09/17/2021 1341   MCV 90.2 09/17/2021 1341   MCV 87 05/31/2020 0848   MCH 31.7 09/17/2021 1341   MCHC 35.1 09/17/2021 1341   RDW 12.4 09/17/2021 1341   RDW 12.4 05/31/2020 0848   LYMPHSABS 1.4 09/17/2021 1341   MONOABS 0.5 09/17/2021 1341   EOSABS 0.1 09/17/2021 1341   BASOSABS 0.0 09/17/2021 1341    DIAGNOSTIC IMAGING:  I have independently reviewed the scans and discussed with the patient. No results found.   ASSESSMENT:  1.  Mild to moderate thrombocytopenia: -Patient evaluated for moderate thrombocytopenia.  CBC on 02/08/2020 showed platelet count 72 with normal white count and hemoglobin. -He had platelet count low since 2011, mostly in the 120 range. -CTAP on 07/12/2019 shows normal spleen with no hepatic masses.  Normal enlarged lymph nodes. -Reviewed labs from 02/29/2020 which showed normal platelet count of 158.  Nutritional deficiency work-up and connective tissue  disorder work-up was negative. - Differential diagnosis includes platelet clumping versus immune mediated thrombocytopenia.   PLAN:  1.  Mild to moderate thrombocytopenia: - He is followed in our clinic for intermittent thrombocytopenia. - He does not have any B symptoms or infections in the last 6 months. - He has easy bruising over the dorsum of the hands due to thinning of the skin.  No bleeding manifestations. - Reviewed labs from 09/17/2021 which showed platelet count 178.  Hemoglobin and white count is normal.  LDH was normal. - He had moderate thrombocytopenia with platelet count 60 in October and June of this year, likely due to platelet clumping. - Recommend follow-up in 6 months with repeat CBC.  Orders placed this encounter:  No orders of the defined types were placed in this encounter.    Derek Jack, MD [place] [phone]   I, Thana Ates, am acting as a scribe for Dr. Derek Jack.  I, Derek Jack MD, have reviewed the above documentation for accuracy and completeness, and I agree with the above.

## 2021-09-24 ENCOUNTER — Inpatient Hospital Stay (HOSPITAL_COMMUNITY): Payer: Medicare HMO | Admitting: Hematology

## 2021-09-24 ENCOUNTER — Other Ambulatory Visit: Payer: Self-pay

## 2021-09-24 VITALS — BP 113/58 | HR 74 | Temp 97.7°F | Resp 16 | Ht 69.0 in | Wt 155.1 lb

## 2021-09-24 DIAGNOSIS — D696 Thrombocytopenia, unspecified: Secondary | ICD-10-CM

## 2021-09-24 DIAGNOSIS — Z79899 Other long term (current) drug therapy: Secondary | ICD-10-CM | POA: Diagnosis not present

## 2021-09-24 NOTE — Patient Instructions (Signed)
Andrew [name] at [facility] Discharge Instructions  You were seen and examined today by Dr. Delton Coombes. He reviewed your most recent labs and everything looks good. Please follow up as scheduled in 6 months.   Thank you for choosing [place] [facility] to provide your oncology and hematology care.  To afford each patient quality time with our provider, please arrive at least 15 minutes before your scheduled appointment time.   If you have a lab appointment with the [facility] please come in thru the Main Entrance and check in at the main information desk.  You need to re-schedule your appointment should you arrive 10 or more minutes late.  We strive to give you quality time with our providers, and arriving late affects you and other patients whose appointments are after yours.  Also, if you no show three or more times for appointments you may be dismissed from the clinic at the providers discretion.     Again, thank you for choosing [facility].  Our hope is that these requests will decrease the amount of time that you wait before being seen by our physicians.       _____________________________________________________________  Should you have questions after your visit to [facility], please contact our office at [phone] and follow the prompts.  Our office hours are 8:00 a.m. and 4:30 p.m. Monday - Friday.  Please note that voicemails left after 4:00 p.m. may not be returned until the following business day.  We are closed weekends and major holidays.  You do have access to a nurse 24-7, just call the main number to the clinic [phone] and do not press any options, hold on the line and a nurse will answer the phone.    For prescription refill requests, have your pharmacy contact our office and allow 72 hours.    Due to Covid, you will need to wear a mask upon entering the hospital. If you do not have a mask,  a mask will be given to you at the Main Entrance upon arrival. For doctor visits, patients may have 1 support person age 74 or older with them. For treatment visits, patients can not have anyone with them due to social distancing guidelines and our immunocompromised population.

## 2021-09-26 ENCOUNTER — Ambulatory Visit (INDEPENDENT_AMBULATORY_CARE_PROVIDER_SITE_OTHER): Payer: Medicare HMO

## 2021-09-26 ENCOUNTER — Other Ambulatory Visit: Payer: Self-pay

## 2021-09-26 ENCOUNTER — Telehealth: Payer: Self-pay

## 2021-09-26 VITALS — Ht 69.0 in | Wt 143.0 lb

## 2021-09-26 DIAGNOSIS — Z0000 Encounter for general adult medical examination without abnormal findings: Secondary | ICD-10-CM | POA: Diagnosis not present

## 2021-09-26 DIAGNOSIS — Z1211 Encounter for screening for malignant neoplasm of colon: Secondary | ICD-10-CM | POA: Diagnosis not present

## 2021-09-26 DIAGNOSIS — K580 Irritable bowel syndrome with diarrhea: Secondary | ICD-10-CM | POA: Diagnosis not present

## 2021-09-26 NOTE — Telephone Encounter (Signed)
While doing AWV, pt states he has recently lost about 10 lbs due to decreased appetite, for several weeks. Pt asks if any of his medications could be causing his decreased appetite?

## 2021-09-26 NOTE — Progress Notes (Signed)
Subjective:   Andrew [name] is a 80 y.o. male who presents for Medicare Annual/Subsequent preventive examination. Virtual Visit via Telephone Note  I connected with  Andrew [name] on 09/26/21 at 10:30 AM EST by telephone and verified that I am speaking with the correct person using two identifiers.  Location: Patient: Home Provider: [facility] Persons participating in the virtual visit: patient/Nurse Health Advisor   I discussed the limitations, risks, security and privacy concerns of performing an evaluation and management service by telephone and the availability of in person appointments. The patient expressed understanding and agreed to proceed.  Interactive audio and video telecommunications were attempted between this nurse and patient, however failed, due to patient having technical difficulties OR patient did not have access to video capability.  We continued and completed visit with audio only.  Some vital signs may be absent or patient reported.   Andrew Driver, LPN  Review of Systems     Cardiac Risk Factors include: advanced age (>67men, >74 women);hypertension;diabetes mellitus;dyslipidemia;male gender;sedentary lifestyle     Objective:    Today's Vitals   09/26/21 1028 09/26/21 1031  Weight: 143 lb (64.9 kg)   Height: $Remove'5\' 9"'HzYavrx$  (1.753 m)   PainSc:  0-No pain   Body mass index is 21.12 kg/m.  Advanced Directives 09/26/2021 09/24/2021 03/25/2021 09/17/2020 08/27/2020 03/15/2020 02/29/2020  Does Patient Have a Medical Advance Directive? No No No No Yes No No  Type of Advance Directive - - - - Press photographer;Living will - -  Would patient like information on creating a medical advance directive? No - Patient declined No - Patient declined No - Patient declined No - Patient declined - No - Patient declined No - Patient declined    Current Medications (verified) Outpatient Encounter Medications as of 09/26/2021  Medication Sig   Accu-Chek Softclix Lancets  lancets Use as instructed to monitor FSBS 1x daily. Dx: E11.9   Alcohol Swabs (B-D SINGLE USE SWABS REGULAR) PADS Use as instructed to monitor FSBS 1x daily. Dx: E11.9   amLODipine (NORVASC) 5 MG tablet Take 1 tablet (5 mg total) by mouth daily.   aspirin 81 MG tablet Take 81 mg by mouth daily.   Blood Glucose Monitoring Suppl (ACCU-CHEK GUIDE ME) w/Device KIT Use as instructed to monitor FSBS 1x daily. Dx: E11.9   Cholecalciferol (VITAMIN D) 2000 units CAPS Take 1 capsule by mouth daily.   finasteride (PROSCAR) 5 MG tablet TAKE 1 TABLET(5 MG) BY MOUTH DAILY.   glucose blood (ACCU-CHEK GUIDE) test strip Use as instructed to monitor FSBS 1x daily. Dx: E11.9   lansoprazole (PREVACID) 30 MG capsule TAKE 1 CAPSULE EVERY DAY   metFORMIN (GLUCOPHAGE XR) 500 MG 24 hr tablet Take 2 tablets (1,000 mg total) by mouth daily with breakfast.   metoprolol succinate (TOPROL-XL) 25 MG 24 hr tablet TAKE 1/2 TABLET EVERY DAY   Multiple Vitamin (MULTIVITAMIN) tablet Take 1 tablet by mouth daily.   nitroGLYCERIN (NITROSTAT) 0.4 MG SL tablet Place 1 tablet (0.4 mg total) under the tongue every 5 (five) minutes as needed.   Omega-3 Fatty Acids (FISH OIL) 1000 MG CPDR Take 1,000 mg by mouth every morning.   simvastatin (ZOCOR) 10 MG tablet TAKE 1 TABLET(10 MG) BY MOUTH AT BEDTIME   tamsulosin (FLOMAX) 0.4 MG CAPS capsule TAKE 1 CAPSULE (0.4 MG TOTAL) BY MOUTH DAILY.   Facility-Administered Encounter Medications as of 09/26/2021  Medication   0.9 %  sodium chloride infusion    Allergies (  verified) Imdur [isosorbide mononitrate] and Meloxicam   History: Past Medical History:  Diagnosis Date   Allergy    grass etc.   Barrett's esophagus    BPH (benign prostatic hyperplasia)    Chest pain, atypical    Chronic kidney disease    kidney stones   Coronary artery disease    GERD (gastroesophageal reflux disease)    Barrett's   Hernia    Hyperlipidemia    Hypertension    Myocardial infarction ([place])     Thrombocytopenia ([place])    Ulcer 1960   Past Surgical History:  Procedure Laterality Date   CARDIAC CATHETERIZATION  02/14/2007   MITRAL REGURGITATION. LV NORMAL   CHOLECYSTECTOMY     COLONOSCOPY     CORONARY ARTERY BYPASS GRAFT     twice  2 by passes each time   INGUINAL HERNIA REPAIR     bilateral done twice   LITHOTRIPSY     UPPER GASTROINTESTINAL ENDOSCOPY     Family History  Problem Relation Age of Onset   Stroke Father    Heart attack Mother    Heart attack Maternal Grandmother    Heart attack Maternal Grandfather    Heart disease Sister    Heart disease Brother    Heart disease Sister    Heart disease Brother    Prostate cancer Brother    Colon cancer Neg Hx    Esophageal cancer Neg Hx    Rectal cancer Neg Hx    Stomach cancer Neg Hx    Social History   Socioeconomic History   Marital status: Married    Spouse name: [place]   Number of children: 2   Years of education: Not on file   Highest education level: Not on file  Occupational History   Occupation: Retired  Tobacco Use   Smoking status: Never   Smokeless tobacco: Never  Vaping Use   Vaping Use: Never used  Substance and Sexual Activity   Alcohol use: No   Drug use: No   Sexual activity: Not Currently  Other Topics Concern   Not on file  Social History Narrative   Married in 1961.   6 grandchildren   Social Determinants of Health   Financial Resource Strain: Low Risk    Difficulty of Paying Living Expenses: Not hard at all  Food Insecurity: No Food Insecurity   Worried About Charity fundraiser in the Last Year: Never true   Arboriculturist in the Last Year: Never true  Transportation Needs: No Transportation Needs   Lack of Transportation (Medical): No   Lack of Transportation (Non-Medical): No  Physical Activity: Insufficiently Active   Days of Exercise per Week: 5 days   Minutes of Exercise per Session: 20 min  Stress: No Stress Concern Present   Feeling of Stress : Not at all  Social  Connections: Socially Integrated   Frequency of Communication with Friends and Family: Three times a week   Frequency of Social Gatherings with Friends and Family: Three times a week   Attends Religious Services: More than 4 times per year   Active Member of Clubs or Organizations: Yes   Attends Music therapist: More than 4 times per year   Marital Status: Married    Tobacco Counseling Counseling given: Not Answered   Clinical Intake:  Pre-visit preparation completed: Yes  Pain : No/denies pain Pain Score: 0-No pain     BMI - recorded: 21.12 Nutritional Status: BMI of 19-24  Normal Nutritional Risks: Unintentional weight loss Diabetes: Yes  How often do you need to have someone help you when you read instructions, pamphlets, or other written materials from your doctor or pharmacy?: 1 - Never  Diabetic?Nutrition Risk Assessment:  Has the patient had any N/V/D within the last 2 months?  No  Does the patient have any non-healing wounds?  No  Has the patient had any unintentional weight loss or weight gain?  Yes   Diabetes:  Is the patient diabetic?  Yes  If diabetic, was a CBG obtained today?  No  Did the patient bring in their glucometer from home?  No  How often do you monitor your CBG's? Irregular.   Financial Strains and Diabetes Management:  Are you having any financial strains with the device, your supplies or your medication? No .  Does the patient want to be seen by Chronic Care Management for management of their diabetes?  No  Would the patient like to be referred to a Nutritionist or for Diabetic Management?  No   Diabetic Exams:  Diabetic Eye Exam: Completed 2022. Pt has been advised about the importance in completing this exam.  Diabetic Foot Exam: Completed DUE. Pt has been advised about the importance in completing this exam. .    Interpreter Needed?: No  Information entered by :: mj Aretta Stetzel,lpn   Activities of Daily Living In your  present state of health, do you have any difficulty performing the following activities: 09/26/2021  Hearing? N  Vision? N  Difficulty concentrating or making decisions? N  Walking or climbing stairs? N  Dressing or bathing? N  Doing errands, shopping? N  Preparing Food and eating ? N  Using the Toilet? N  In the past six months, have you accidently leaked urine? Y  Comment On medication.  Do you have problems with loss of bowel control? N  Managing your Medications? N  Managing your Finances? N  Housekeeping or managing your Housekeeping? N  Some recent data might be hidden    Patient Care Team: Susy Frizzle, MD as PCP - General (Family Medicine) Nahser, Wonda Cheng, MD as PCP - Cardiology (Cardiology) Edythe Clarity, [facility] as Pharmacist (Pharmacist)  Indicate any recent Medical Services you may have received from other than [facility] providers in the past year (date may be approximate).     Assessment:   This is a routine wellness examination for Dellis.  Hearing/Vision screen Hearing Screening - Comments:: Some hearing issues.  Vision Screening - Comments:: Glasses, readers. 2022. [facility].   Dietary issues and exercise activities discussed: Current Exercise Habits: Home exercise routine, Type of exercise: walking, Time (Minutes): 15, Frequency (Times/Week): 5, Weekly Exercise (Minutes/Week): 75, Intensity: Mild, Exercise limited by: cardiac condition(s)   Goals Addressed             This Visit's Progress    Have 3 meals a day         Depression Screen PHQ 2/9 Scores 09/26/2021 08/27/2020 02/15/2020 12/31/2017 11/05/2017 09/23/2017 08/31/2017  PHQ - 2 Score 0 0 0 0 0 0 0    Fall Risk Fall Risk  09/26/2021 01/10/2021 08/27/2020 02/15/2020 12/31/2017  Falls in the past year? 0 0 0 0 No  Number falls in past yr: 0 0 0 - -  Injury with Fall? 0 0 0 - -  Risk for fall due to : No Fall Risks - - - -  Follow up Falls prevention discussed - - Falls evaluation  completed -    FALL RISK PREVENTION PERTAINING TO THE HOME:  Any stairs in or around the home? Yes  If so, are there any without handrails? No  Home free of loose throw rugs in walkways, pet beds, electrical cords, etc? Yes  Adequate lighting in your home to reduce risk of falls? Yes   ASSISTIVE DEVICES UTILIZED TO PREVENT FALLS:  Life alert? No  Use of a cane, walker or w/c? No  Grab bars in the bathroom? Yes  Shower chair or bench in shower? No  Elevated toilet seat or a handicapped toilet? No   TIMED UP AND GO:  Was the test performed? No .    Cognitive Function: Normal cognitive status assessed by direct observation by this Nurse Health Advisor. No abnormalities found.       6CIT Screen 08/27/2020  What Year? 0 points  What month? 0 points  What time? 0 points  Count back from 20 0 points  Months in reverse 0 points  Repeat phrase 0 points  Total Score 0    Immunizations Immunization History  Administered Date(s) Administered   Fluad Quad(high Dose 65+) 06/30/2019, 06/18/2020, 08/07/2021   Influenza Split 07/07/2012   Influenza Whole 07/18/2010   Influenza, High Dose Seasonal PF 08/16/2017, 09/07/2018   Influenza,inj,Quad PF,6+ Mos 07/24/2013, 09/10/2014, 08/30/2015, 08/21/2016   Moderna Sars-Covid-2 Vaccination 12/20/2019, 01/17/2020, 10/18/2020   Pneumococcal Conjugate-13 09/10/2014   Pneumococcal Polysaccharide-23 08/16/2007   Td 07/30/2003   Zoster, Live 06/11/2006    TDAP status: Due, Education has been provided regarding the importance of this vaccine. Advised may receive this vaccine at local pharmacy or Health Dept. Aware to provide a copy of the vaccination record if obtained from local pharmacy or Health Dept. Verbalized acceptance and understanding.  Flu Vaccine status: Up to date  Pneumococcal vaccine status: Up to date  Covid-19 vaccine status: Completed vaccines  Qualifies for Shingles Vaccine? Yes   Zostavax completed Yes   Shingrix  Completed?: No.    Education has been provided regarding the importance of this vaccine. Patient has been advised to call insurance company to determine out of pocket expense if they have not yet received this vaccine. Advised may also receive vaccine at local pharmacy or Health Dept. Verbalized acceptance and understanding.  Screening Tests Health Maintenance  Topic Date Due   Zoster Vaccines- Shingrix (1 of 2) Never done   TETANUS/TDAP  07/29/2013   COVID-19 Vaccine (4 - Booster for Moderna series) 12/13/2020   URINE MICROALBUMIN  08/27/2021   COLONOSCOPY (Pts 45-42yrs Insurance coverage will need to be confirmed)  09/14/2021   Pneumonia Vaccine 44+ Years old  Completed   INFLUENZA VACCINE  Completed   HPV VACCINES  Aged Out    Health Maintenance  Health Maintenance Due  Topic Date Due   Zoster Vaccines- Shingrix (1 of 2) Never done   TETANUS/TDAP  07/29/2013   COVID-19 Vaccine (4 - Booster for Moderna series) 12/13/2020   URINE MICROALBUMIN  08/27/2021   COLONOSCOPY (Pts 45-23yrs Insurance coverage will need to be confirmed)  09/14/2021    Colorectal cancer screening: Type of screening: Colonoscopy. Completed 09/24/2018. Repeat every 3 years  Lung Cancer Screening: (Low Dose CT Chest recommended if Age 6-80 years, 30 pack-year currently smoking OR have quit w/in 15years.) does not qualify.    Additional Screening:  Hepatitis C Screening: does not qualify;   Vision Screening: Recommended annual ophthalmology exams for early detection of glaucoma and other disorders of the eye. Is the  patient up to date with their annual eye exam?  Yes  Who is the provider or what is the name of the office in which the patient attends annual eye exams? [facility] If pt is not established with a provider, would they like to be referred to a provider to establish care? No .   Dental Screening: Recommended annual dental exams for proper oral hygiene  Community Resource Referral /  Chronic Care Management: CRR required this visit?  No   CCM required this visit?  No      Plan:     I have personally reviewed and noted the following in the patients chart:   Medical and social history Use of alcohol, tobacco or illicit drugs  Current medications and supplements including opioid prescriptions. Patient is not currently taking opioid prescriptions. Functional ability and status Nutritional status Physical activity Advanced directives List of other physicians Hospitalizations, surgeries, and ER visits in previous 12 months Vitals Screenings to include cognitive, depression, and falls Referrals and appointments  In addition, I have reviewed and discussed with patient certain preventive protocols, quality metrics, and best practice recommendations. A written personalized care plan for preventive services as well as general preventive health recommendations were provided to patient.     Andrew Driver, LPN   [id number]   Nurse Notes: Pt due for colonoscopy. Pt asks for it to be scheduled for May 2023. Order placed to set up in May. Discussed Shingrix and Tdap and how to obtain.

## 2021-09-26 NOTE — Patient Instructions (Signed)
Andrew [name] , Thank you for taking time to come for your Medicare Wellness Visit. I appreciate your ongoing commitment to your health goals. Please review the following plan we discussed and let me know if I can assist you in the future.   Screening recommendations/referrals: Colonoscopy: Done 09/14/2018 Repeat every 3 years. Order placed today to schedule for May 2023.  Recommended yearly ophthalmology/optometry visit for glaucoma screening and checkup Recommended yearly dental visit for hygiene and checkup  Vaccinations: Influenza vaccine: Done 08/07/2021 Repeat annually  Pneumococcal vaccine: Done 08/16/2007 and 09/10/2014 Tdap vaccine: Done 07/30/2003 Repeat in 10 years  Shingles vaccine: Shingrix discussed. Please contact your pharmacy for coverage information.  Zoster done 06/11/2006   Covid-19: Done 10/18/2020, 01/17/2020 and 12/20/2019.  Advanced directives: Advance directive discussed with you today. Even though you declined this today, please call our office should you change your mind, and we can give you the proper paperwork for you to fill out.   Conditions/risks identified: Aim for 30 minutes of exercise or walking each day, drink 6-8 glasses of water and eat lots of fruits and vegetables. KEEP UP THE GOOD WORK!!  Next appointment: Follow up in one year for your annual wellness visit. 2023.  Preventive Care 14 Years and Older, Male  Preventive care refers to lifestyle choices and visits with your health care provider that can promote health and wellness. What does preventive care include? A yearly physical exam. This is also called an annual well check. Dental exams once or twice a year. Routine eye exams. Ask your health care provider how often you should have your eyes checked. Personal lifestyle choices, including: Daily care of your teeth and gums. Regular physical activity. Eating a healthy diet. Avoiding tobacco and drug use. Limiting alcohol use. Practicing safe  sex. Taking low doses of aspirin every day. Taking vitamin and mineral supplements as recommended by your health care provider. What happens during an annual well check? The services and screenings done by your health care provider during your annual well check will depend on your age, overall health, lifestyle risk factors, and family history of disease. Counseling  Your health care provider may ask you questions about your: Alcohol use. Tobacco use. Drug use. Emotional well-being. Home and relationship well-being. Sexual activity. Eating habits. History of falls. Memory and ability to understand (cognition). Work and work Statistician. Screening  You may have the following tests or measurements: Height, weight, and BMI. Blood pressure. Lipid and cholesterol levels. These may be checked every 5 years, or more frequently if you are over 1 years old. Skin check. Lung cancer screening. You may have this screening every year starting at age 39 if you have a 30-pack-year history of smoking and currently smoke or have quit within the past 15 years. Fecal occult blood test (FOBT) of the stool. You may have this test every year starting at age 39. Flexible sigmoidoscopy or colonoscopy. You may have a sigmoidoscopy every 5 years or a colonoscopy every 10 years starting at age 84. Prostate cancer screening. Recommendations will vary depending on your family history and other risks. Hepatitis C blood test. Hepatitis B blood test. Sexually transmitted disease (STD) testing. Diabetes screening. This is done by checking your blood sugar (glucose) after you have not eaten for a while (fasting). You may have this done every 1-3 years. Abdominal aortic aneurysm (AAA) screening. You may need this if you are a current or former smoker. Osteoporosis. You may be screened starting at age 75 if you  are at high risk. Talk with your health care provider about your test results, treatment options, and if  necessary, the need for more tests. Vaccines  Your health care provider may recommend certain vaccines, such as: Influenza vaccine. This is recommended every year. Tetanus, diphtheria, and acellular pertussis (Tdap, Td) vaccine. You may need a Td booster every 10 years. Zoster vaccine. You may need this after age 8. Pneumococcal 13-valent conjugate (PCV13) vaccine. One dose is recommended after age 44. Pneumococcal polysaccharide (PPSV23) vaccine. One dose is recommended after age 85. Talk to your health care provider about which screenings and vaccines you need and how often you need them. This information is not intended to replace advice given to you by your health care provider. Make sure you discuss any questions you have with your health care provider. Document Released: 10/25/2015 Document Revised: 06/17/2016 Document Reviewed: 07/30/2015 Elsevier Interactive Patient Education  2017 [place] Prevention in the Home Falls can cause injuries. They can happen to people of all ages. There are many things you can do to make your home safe and to help prevent falls. What can I do on the outside of my home? Regularly fix the edges of walkways and driveways and fix any cracks. Remove anything that might make you trip as you walk through a door, such as a raised step or threshold. Trim any bushes or trees on the path to your home. Use bright outdoor lighting. Clear any walking paths of anything that might make someone trip, such as rocks or tools. Regularly check to see if handrails are loose or broken. Make sure that both sides of any steps have handrails. Any raised decks and porches should have guardrails on the edges. Have any leaves, snow, or ice cleared regularly. Use sand or salt on walking paths during winter. Clean up any spills in your garage right away. This includes oil or grease spills. What can I do in the bathroom? Use night lights. Install grab bars by the toilet  and in the tub and shower. Do not use towel bars as grab bars. Use non-skid mats or decals in the tub or shower. If you need to sit down in the shower, use a plastic, non-slip stool. Keep the floor dry. Clean up any water that spills on the floor as soon as it happens. Remove soap buildup in the tub or shower regularly. Attach bath mats securely with double-sided non-slip rug tape. Do not have throw rugs and other things on the floor that can make you trip. What can I do in the bedroom? Use night lights. Make sure that you have a light by your bed that is easy to reach. Do not use any sheets or blankets that are too big for your bed. They should not hang down onto the floor. Have a firm chair that has side arms. You can use this for support while you get dressed. Do not have throw rugs and other things on the floor that can make you trip. What can I do in the kitchen? Clean up any spills right away. Avoid walking on wet floors. Keep items that you use a lot in easy-to-reach places. If you need to reach something above you, use a strong step stool that has a grab bar. Keep electrical cords out of the way. Do not use floor polish or wax that makes floors slippery. If you must use wax, use non-skid floor wax. Do not have throw rugs and other things on the  floor that can make you trip. What can I do with my stairs? Do not leave any items on the stairs. Make sure that there are handrails on both sides of the stairs and use them. Fix handrails that are broken or loose. Make sure that handrails are as long as the stairways. Check any carpeting to make sure that it is firmly attached to the stairs. Fix any carpet that is loose or worn. Avoid having throw rugs at the top or bottom of the stairs. If you do have throw rugs, attach them to the floor with carpet tape. Make sure that you have a light switch at the top of the stairs and the bottom of the stairs. If you do not have them, ask someone to add  them for you. What else can I do to help prevent falls? Wear shoes that: Do not have high heels. Have rubber bottoms. Are comfortable and fit you well. Are closed at the toe. Do not wear sandals. If you use a stepladder: Make sure that it is fully opened. Do not climb a closed stepladder. Make sure that both sides of the stepladder are locked into place. Ask someone to hold it for you, if possible. Clearly mark and make sure that you can see: Any grab bars or handrails. First and last steps. Where the edge of each step is. Use tools that help you move around (mobility aids) if they are needed. These include: Canes. Walkers. Scooters. Crutches. Turn on the lights when you go into a dark area. Replace any light bulbs as soon as they burn out. Set up your furniture so you have a clear path. Avoid moving your furniture around. If any of your floors are uneven, fix them. If there are any pets around you, be aware of where they are. Review your medicines with your doctor. Some medicines can make you feel dizzy. This can increase your chance of falling. Ask your doctor what other things that you can do to help prevent falls. This information is not intended to replace advice given to you by your health care provider. Make sure you discuss any questions you have with your health care provider. Document Released: 07/25/2009 Document Revised: 03/05/2016 Document Reviewed: 11/02/2014 Elsevier Interactive Patient Education  2017 [employer].

## 2021-10-01 DIAGNOSIS — D485 Neoplasm of uncertain behavior of skin: Secondary | ICD-10-CM | POA: Diagnosis not present

## 2021-10-01 DIAGNOSIS — L98429 Non-pressure chronic ulcer of back with unspecified severity: Secondary | ICD-10-CM | POA: Diagnosis not present

## 2021-10-05 ENCOUNTER — Other Ambulatory Visit: Payer: Self-pay | Admitting: Family Medicine

## 2021-10-08 ENCOUNTER — Encounter: Payer: Self-pay | Admitting: Gastroenterology

## 2021-10-10 ENCOUNTER — Encounter: Payer: Self-pay | Admitting: Family Medicine

## 2021-10-10 ENCOUNTER — Ambulatory Visit (INDEPENDENT_AMBULATORY_CARE_PROVIDER_SITE_OTHER): Payer: Medicare HMO | Admitting: Family Medicine

## 2021-10-10 ENCOUNTER — Other Ambulatory Visit: Payer: Self-pay

## 2021-10-10 VITALS — BP 110/60 | HR 74 | Temp 98.4°F | Resp 16 | Ht 69.0 in | Wt 152.6 lb

## 2021-10-10 DIAGNOSIS — R634 Abnormal weight loss: Secondary | ICD-10-CM | POA: Diagnosis not present

## 2021-10-10 DIAGNOSIS — I2510 Atherosclerotic heart disease of native coronary artery without angina pectoris: Secondary | ICD-10-CM | POA: Diagnosis not present

## 2021-10-10 DIAGNOSIS — E118 Type 2 diabetes mellitus with unspecified complications: Secondary | ICD-10-CM

## 2021-10-10 DIAGNOSIS — E785 Hyperlipidemia, unspecified: Secondary | ICD-10-CM

## 2021-10-10 DIAGNOSIS — I10 Essential (primary) hypertension: Secondary | ICD-10-CM | POA: Diagnosis not present

## 2021-10-10 DIAGNOSIS — Z0001 Encounter for general adult medical examination with abnormal findings: Secondary | ICD-10-CM

## 2021-10-10 DIAGNOSIS — Z0000 Encounter for general adult medical examination without abnormal findings: Secondary | ICD-10-CM

## 2021-10-10 MED ORDER — LANSOPRAZOLE 30 MG PO CPDR: 30.0000 mg | DELAYED_RELEASE_CAPSULE | Freq: Every day | ORAL | 3 refills | Status: DC

## 2021-10-10 NOTE — Progress Notes (Signed)
Subjective:    Patient ID: Andrew [name], male    DOB: 1941-03-03, 80 y.o.   MRN: [id number]   Wt Readings from Last 3 Encounters:  10/10/21 152 lb 9.6 oz (69.2 kg)  09/26/21 143 lb (64.9 kg)  09/24/21 155 lb 1.6 oz (70.4 kg)  Patient is here today for complete physical exam.  He is concerned about weight loss.  His weight is down from 155 pounds earlier this month 152 pounds today.  Last year, he fluctuated between 158 and 160 pounds.  He feels that he is lost majority of the weight in his neck and in his shoulders and his musculature.  He denies any fevers or chills.  He denies any body aches, cough, night sweats, bruising.  He denies any nausea or vomiting or diarrhea.  He denies any melena or hematochezia.  He denies any chest pain or shortness of breath.  He denies any rash or joint pain.  He is concerned that it may be his thyroid.  He is also concerned about his declining muscle mass associated with the weight loss.  We discussed whether this could be normal age-related decline but he feels that there may be more going on. Immunization History  Administered Date(s) Administered   Fluad Quad(high Dose 65+) 06/30/2019, 06/18/2020, 08/07/2021   Influenza Split 07/07/2012   Influenza Whole 07/18/2010   Influenza, High Dose Seasonal PF 08/16/2017, 09/07/2018   Influenza,inj,Quad PF,6+ Mos 07/24/2013, 09/10/2014, 08/30/2015, 08/21/2016   Moderna Sars-Covid-2 Vaccination 12/20/2019, 01/17/2020, 10/18/2020   Pneumococcal Conjugate-13 09/10/2014   Pneumococcal Polysaccharide-23 08/16/2007   Td 07/30/2003   Zoster, Live 06/11/2006   Patient's immunizations are up-to-date.  Given his age, he does not require colonoscopy.  He sees a urologist for prostate cancer screening  Past Medical History:  Diagnosis Date   Allergy    grass etc.   Barrett's esophagus    BPH (benign prostatic hyperplasia)    Chest pain, atypical    Chronic kidney disease    kidney stones   Coronary artery disease     GERD (gastroesophageal reflux disease)    Barrett's   Hernia    Hyperlipidemia    Hypertension    Myocardial infarction (HCC)    Thrombocytopenia (HCC)    Ulcer 1960   Past Surgical History:  Procedure Laterality Date   CARDIAC CATHETERIZATION  02/14/2007   MITRAL REGURGITATION. LV NORMAL   CHOLECYSTECTOMY     COLONOSCOPY     CORONARY ARTERY BYPASS GRAFT     twice  2 by passes each time   INGUINAL HERNIA REPAIR     bilateral done twice   LITHOTRIPSY     UPPER GASTROINTESTINAL ENDOSCOPY     Current Outpatient Medications on File Prior to Visit  Medication Sig Dispense Refill   Accu-Chek Softclix Lancets lancets Use as instructed to monitor FSBS 1x daily. Dx: E11.9 100 each 1   Alcohol Swabs (B-D SINGLE USE SWABS REGULAR) PADS Use as instructed to monitor FSBS 1x daily. Dx: E11.9 100 each 1   amLODipine (NORVASC) 5 MG tablet Take 1 tablet (5 mg total) by mouth daily. 90 tablet 3   aspirin 81 MG tablet Take 81 mg by mouth daily.     Blood Glucose Monitoring Suppl (ACCU-CHEK GUIDE ME) w/Device KIT Use as instructed to monitor FSBS 1x daily. Dx: E11.9 1 kit 1   Cholecalciferol (VITAMIN D) 2000 units CAPS Take 1 capsule by mouth daily.     finasteride (PROSCAR) 5 MG tablet TAKE  1 TABLET(5 MG) BY MOUTH DAILY. 90 tablet 3   glucose blood (ACCU-CHEK GUIDE) test strip Use as instructed to monitor FSBS 1x daily. Dx: E11.9 100 each 1   lansoprazole (PREVACID) 30 MG capsule TAKE 1 CAPSULE EVERY DAY 90 capsule 3   metFORMIN (GLUCOPHAGE-XR) 500 MG 24 hr tablet TAKE 2 TABLETS(1000 MG) BY MOUTH DAILY WITH BREAKFAST 60 tablet 3   metoprolol succinate (TOPROL-XL) 25 MG 24 hr tablet TAKE 1/2 TABLET EVERY DAY 45 tablet 3   Multiple Vitamin (MULTIVITAMIN) tablet Take 1 tablet by mouth daily.     nitroGLYCERIN (NITROSTAT) 0.4 MG SL tablet Place 1 tablet (0.4 mg total) under the tongue every 5 (five) minutes as needed. 45 tablet 3   Omega-3 Fatty Acids (FISH OIL) 1000 MG CPDR Take 1,000 mg by mouth  every morning.     simvastatin (ZOCOR) 10 MG tablet TAKE 1 TABLET(10 MG) BY MOUTH AT BEDTIME 90 tablet 3   tamsulosin (FLOMAX) 0.4 MG CAPS capsule TAKE 1 CAPSULE (0.4 MG TOTAL) BY MOUTH DAILY. 90 capsule 1   Current Facility-Administered Medications on File Prior to Visit  Medication Dose Route Frequency Provider Last Rate Last Admin   0.9 %  sodium chloride infusion  500 mL Intravenous Once Armbruster, Carlota Raspberry, MD       Allergies  Allergen Reactions   Imdur [Isosorbide Mononitrate]     Unknown, per pt    Meloxicam Swelling    Facial swelling    Social History   Socioeconomic History   Marital status: Married    Spouse name: Wilton Manors   Number of children: 2   Years of education: Not on file   Highest education level: Not on file  Occupational History   Occupation: Retired  Tobacco Use   Smoking status: Never   Smokeless tobacco: Never  Vaping Use   Vaping Use: Never used  Substance and Sexual Activity   Alcohol use: No   Drug use: No   Sexual activity: Not Currently  Other Topics Concern   Not on file  Social History Narrative   Married in 1961.   6 grandchildren   Social Determinants of Health   Financial Resource Strain: Low Risk    Difficulty of Paying Living Expenses: Not hard at all  Food Insecurity: No Food Insecurity   Worried About Charity fundraiser in the Last Year: Never true   Arboriculturist in the Last Year: Never true  Transportation Needs: No Transportation Needs   Lack of Transportation (Medical): No   Lack of Transportation (Non-Medical): No  Physical Activity: Insufficiently Active   Days of Exercise per Week: 5 days   Minutes of Exercise per Session: 20 min  Stress: No Stress Concern Present   Feeling of Stress : Not at all  Social Connections: Socially Integrated   Frequency of Communication with Friends and Family: Three times a week   Frequency of Social Gatherings with Friends and Family: Three times a week   Attends Religious  Services: More than 4 times per year   Active Member of Clubs or Organizations: Yes   Attends Music therapist: More than 4 times per year   Marital Status: Married  Human resources officer Violence: Not At Risk   Fear of Current or Ex-Partner: No   Emotionally Abused: No   Physically Abused: No   Sexually Abused: No      Review of Systems  All other systems reviewed and are negative.  Objective:   Physical Exam Vitals reviewed.  Constitutional:      Appearance: Normal appearance.  Cardiovascular:     Rate and Rhythm: Normal rate and regular rhythm.     Heart sounds: Normal heart sounds. No murmur heard.   No friction rub. No gallop.  Pulmonary:     Effort: Pulmonary effort is normal. No respiratory distress.     Breath sounds: Normal breath sounds. No wheezing, rhonchi or rales.  Abdominal:     General: Abdomen is flat. Bowel sounds are normal.     Palpations: Abdomen is soft.  Musculoskeletal:     Right lower leg: No edema.     Left lower leg: No edema.  Neurological:     Mental Status: He is alert.          Assessment & Plan:  Encounter for Medicare annual wellness exam  Controlled type 2 diabetes mellitus with complication, without long-term current use of insulin ([place]) - Plan: COMPLETE METABOLIC PANEL WITH GFR, Hemoglobin A1c, Microalbumin, urine  Primary hypertension  Hyperlipidemia, unspecified hyperlipidemia type  Coronary artery disease involving native coronary artery of native heart without angina pectoris  Weight loss - Plan: TSH, Testosterone Total,Free,Bio, Males Given the weight loss and the declining muscle mass I will check a free testosterone, total testosterone, TSH.  I will also monitor his diabetes management by checking an A1c.  Goal A1c is less than 6.5.  Check a urine microalbumin.  Goal albumin to creatinine ratio is less than 30.  Check a CMP to monitor kidney function and liver function test.  CBC was just obtained earlier  this month with his oncologist and was normal.  Blood pressure today is outstanding.  I feel that the weight loss is most likely normal body changes associated with aging process.  He is denying any symptoms concerning for an occult malignancy on his review of systems.  Immunizations are up-to-date.  He denies any falls or depression or memory loss.

## 2021-10-11 LAB — TSH: TSH: 1.37 mIU/L (ref 0.40–4.50)

## 2021-10-11 LAB — COMPLETE METABOLIC PANEL WITH GFR
AG Ratio: 2.3 (calc) (ref 1.0–2.5)
ALT: 13 U/L (ref 9–46)
AST: 14 U/L (ref 10–35)
Albumin: 4.1 g/dL (ref 3.6–5.1)
Alkaline phosphatase (APISO): 46 U/L (ref 35–144)
BUN: 22 mg/dL (ref 7–25)
CO2: 28 mmol/L (ref 20–32)
Calcium: 9 mg/dL (ref 8.6–10.3)
Chloride: 106 mmol/L (ref 98–110)
Creat: 0.77 mg/dL (ref 0.70–1.22)
Globulin: 1.8 g/dL (calc) — ABNORMAL LOW (ref 1.9–3.7)
Glucose, Bld: 153 mg/dL — ABNORMAL HIGH (ref 65–99)
Potassium: 4 mmol/L (ref 3.5–5.3)
Sodium: 142 mmol/L (ref 135–146)
Total Bilirubin: 0.4 mg/dL (ref 0.2–1.2)
Total Protein: 5.9 g/dL — ABNORMAL LOW (ref 6.1–8.1)
eGFR: 89 mL/min/{1.73_m2} (ref >=60)

## 2021-10-11 LAB — HEMOGLOBIN A1C
Hgb A1c MFr Bld: 6.2 % of total Hgb — ABNORMAL HIGH (ref <5.7)
Mean Plasma Glucose: 131 mg/dL
eAG (mmol/L): 7.3 mmol/L

## 2021-10-11 LAB — TESTOSTERONE TOTAL,FREE,BIO, MALES
Albumin: 4.1 g/dL (ref 3.6–5.1)
Sex Hormone Binding: 53 nmol/L (ref 22–77)
Testosterone, Bioavailable: 58.9 ng/dL (ref 15.0–150.0)
Testosterone, Free: 31.3 pg/mL (ref 6.0–73.0)
Testosterone: 358 ng/dL (ref 250–827)

## 2021-10-11 LAB — MICROALBUMIN, URINE: Microalb, Ur: 1 mg/dL

## 2021-10-14 ENCOUNTER — Telehealth: Payer: Self-pay | Admitting: Family Medicine

## 2021-10-14 NOTE — Telephone Encounter (Signed)
Busy 1/3-jhb

## 2021-10-14 NOTE — Telephone Encounter (Signed)
Patient called to request recent lab results. Please advise at [phone].

## 2021-10-15 NOTE — Telephone Encounter (Signed)
Spoke with patient regarding results

## 2021-10-16 NOTE — Progress Notes (Signed)
Chronic Care Management Pharmacy Note  10/24/2021 Name:  Andrew [name] MRN:  [id number] DOB:  [birth year]  Subjective: Andrew [name] is an 81 y.o. year old male who is a primary patient of Pickard, Cammie Mcgee, MD.  The CCM team was consulted for assistance with disease management and care coordination needs.    Engaged with patient by telephone for follow up visit in response to provider referral for pharmacy case management and/or care coordination services.   Consent to Services:  The patient was given the following information about Chronic Care Management services today, agreed to services, and gave verbal consent: 1. CCM service includes personalized support from designated clinical staff supervised by the primary care provider, including individualized plan of care and coordination with other care providers 2. 24/7 contact phone numbers for assistance for urgent and routine care needs. 3. Service will only be billed when office clinical staff spend 20 minutes or more in a month to coordinate care. 4. Only one practitioner may furnish and bill the service in a calendar month. 5.The patient may stop CCM services at any time (effective at the end of the month) by phone call to the office staff. 6. The patient will be responsible for cost sharing (co-pay) of up to 20% of the service fee (after annual deductible is met). Patient agreed to services and consent obtained.  Patient Care Team: Susy Frizzle, MD as PCP - General (Family Medicine) Nahser, Wonda Cheng, MD as PCP - Cardiology (Cardiology) Edythe Clarity, [facility] as Pharmacist (Pharmacist) Edythe Clarity, [facility] (Pharmacist)  Recent office visits: 06/06/21 Dennard Schaumann) - started on Metformin XR 1077m every morning, recheck A1c in October 01/10/21 (Pickard) - L sided lower back pain.  BP was elevated and amlodipine 531mwas added to medication.  12/12/20 (Pickard) - BP was elevated patient is to record readings and report back in one  week.   Recent consult visits: None since last CCM contact  Hospital visits: None in previous 6 months  Objective:  Lab Results  Component Value Date   CREATININE 0.77 10/10/2021   BUN 22 10/10/2021   GFR 91.76 07/08/2015   GFRNONAA 82 04/09/2021   GFRAA 95 04/09/2021   NA 142 10/10/2021   K 4.0 10/10/2021   CALCIUM 9.0 10/10/2021   CO2 28 10/10/2021   GLUCOSE 153 (H) 10/10/2021    Lab Results  Component Value Date/Time   HGBA1C 6.2 (H) 10/10/2021 11:14 AM   HGBA1C 6.5 (H) 07/25/2021 08:04 AM   GFR 91.76 07/08/2015 07:39 AM   GFR 86.83 11/11/2011 08:49 AM   MICROALBUR 1.0 10/10/2021 11:14 AM   MICROALBUR 0.9 08/27/2020 08:59 AM    Last diabetic Eye exam: No results found for: HMDIABEYEEXA  Last diabetic Foot exam: No results found for: HMDIABFOOTEX   Lab Results  Component Value Date   CHOL 102 07/25/2021   HDL 41 07/25/2021   LDLCALC 48 07/25/2021   TRIG 45 07/25/2021   CHOLHDL 2.5 07/25/2021    Hepatic Function Latest Ref Rng & Units 10/10/2021 07/25/2021 05/27/2021  Total Protein 6.1 - 8.1 g/dL 5.9(L) 5.9(L) 6.1  Albumin 3.6 - 4.6 g/dL - - 4.2  AST 10 - 35 U/L _0 ALT 9 - 46 U/L _1 Alk Phosphatase 44 - 121 IU/L - - 69  Total Bilirubin 0.2 - 1.2 mg/dL 0.4 0.5 0.4  Bilirubin, Direct 0.00 - 0.40 mg/dL - - 0.14    Lab Results  Component Value  Date/Time   TSH 1.37 10/10/2021 11:14 AM   TSH 2.14 07/15/2018 08:48 AM    CBC Latest Ref Rng & Units 09/17/2021 07/25/2021 04/09/2021  WBC 4.0 - 10.5 K/uL 7.0 6.9 6.4  Hemoglobin 13.0 - 17.0 g/dL 13.9 13.6 13.7  Hematocrit 39.0 - 52.0 % 39.6 40.9 40.7  Platelets 150 - 400 K/uL 178 60(L) 59(L)    Lab Results  Component Value Date/Time   VD25OH 52.05 02/29/2020 03:54 PM    Clinical ASCVD: No  The ASCVD Risk score (Arnett DK, et al., 2019) failed to calculate for the following reasons:   The 2019 ASCVD risk score is only valid for ages 18 to 62   The patient has a prior MI or stroke diagnosis     Depression screen [facility] 2/9 10/10/2021 09/26/2021 08/27/2020  Decreased Interest 0 0 0  Down, Depressed, Hopeless 0 0 0  PHQ - 2 Score 0 0 0  Altered sleeping 1 - -  Tired, decreased energy 3 - -  Change in appetite 0 - -  Feeling bad or failure about yourself  0 - -  Trouble concentrating 0 - -  Moving slowly or fidgety/restless 0 - -  Suicidal thoughts 0 - -  PHQ-9 Score 4 - -  Some recent data might be hidden     Social History   Tobacco Use  Smoking Status Never  Smokeless Tobacco Never   BP Readings from Last 3 Encounters:  10/10/21 110/60  09/24/21 (!) 113/58  07/18/21 138/66   Pulse Readings from Last 3 Encounters:  10/10/21 74  09/24/21 74  07/18/21 72   Wt Readings from Last 3 Encounters:  10/10/21 152 lb 9.6 oz (69.2 kg)  09/26/21 143 lb (64.9 kg)  09/24/21 155 lb 1.6 oz (70.4 kg)   BMI Readings from Last 3 Encounters:  10/10/21 22.54 kg/m  09/26/21 21.12 kg/m  09/24/21 22.90 kg/m    Assessment/Interventions: Review of patient past medical history, allergies, medications, health status, including review of consultants reports, laboratory and other test data, was performed as part of comprehensive evaluation and provision of chronic care management services.   SDOH:  (Social Determinants of Health) assessments and interventions performed: Yes   Financial Resource Strain: Low Risk    Difficulty of Paying Living Expenses: Not hard at all    SDOH Screenings   Alcohol Screen: Low Risk    Last Alcohol Screening Score (AUDIT): 0  Depression (PHQ2-9): Low Risk    PHQ-2 Score: 4  Financial Resource Strain: Low Risk    Difficulty of Paying Living Expenses: Not hard at all  Food Insecurity: No Food Insecurity   Worried About Charity fundraiser in the Last Year: Never true   Ran Out of Food in the Last Year: Never true  Housing: Low Risk    Last Housing Risk Score: 0  Physical Activity: Insufficiently Active   Days of Exercise per Week: 5 days    Minutes of Exercise per Session: 20 min  Social Connections: Socially Integrated   Frequency of Communication with Friends and Family: Three times a week   Frequency of Social Gatherings with Friends and Family: Three times a week   Attends Religious Services: More than 4 times per year   Active Member of Clubs or Organizations: Yes   Attends Archivist Meetings: More than 4 times per year   Marital Status: Married  Stress: No Stress Concern Present   Feeling of Stress : Not at all  Tobacco Use: Low Risk    Smoking Tobacco Use: Never   Smokeless Tobacco Use: Never   Passive Exposure: Not on file  Transportation Needs: No Transportation Needs   Lack of Transportation (Medical): No   Lack of Transportation (Non-Medical): No    CCM Care Plan  Allergies  Allergen Reactions   Imdur [Isosorbide Mononitrate]     Unknown, per pt    Meloxicam Swelling    Facial swelling     Medications Reviewed Today     Reviewed by Edythe Clarity, [facility] (Pharmacist) on 10/24/21 at [phone]  Med List Status: <None>   Medication Order Taking? Sig Documenting Provider Last Dose Status Informant  0.9 %  sodium chloride infusion [id number]   Armbruster, Carlota Raspberry, MD  Active   Accu-Chek Softclix Lancets lancets [id number] Yes Use as instructed to monitor FSBS 1x daily. Dx: E11.9 Susy Frizzle, MD Taking Active   Alcohol Swabs (B-D SINGLE USE SWABS REGULAR) PADS [id number] Yes Use as instructed to monitor FSBS 1x daily. Dx: E11.9 Susy Frizzle, MD Taking Active   amLODipine (NORVASC) 5 MG tablet [id number] Yes TAKE 1 TABLET(5 MG) BY MOUTH DAILY Susy Frizzle, MD Taking Active   aspirin 81 MG tablet [id number] Yes Take 81 mg by mouth daily. [provider] Taking Active Spouse/Significant Other  Blood Glucose Monitoring Suppl (ACCU-CHEK GUIDE ME) w/Device KIT [id number] Yes Use as instructed to monitor FSBS 1x daily. Dx: E11.9 Susy Frizzle, MD Taking Active   Cholecalciferol  (VITAMIN D) 2000 units CAPS [id number] Yes Take 1 capsule by mouth daily. [provider] Taking Active Spouse/Significant Other  finasteride (PROSCAR) 5 MG tablet [id number] Yes TAKE 1 TABLET(5 MG) BY MOUTH DAILY. Susy Frizzle, MD Taking Active   glucose blood (ACCU-CHEK GUIDE) test strip [id number] Yes Use as instructed to monitor FSBS 1x daily. Dx: E11.9 Susy Frizzle, MD Taking Active   lansoprazole (PREVACID) 30 MG capsule [id number] Yes Take 1 capsule (30 mg total) by mouth daily. Susy Frizzle, MD Taking Active   metFORMIN (GLUCOPHAGE-XR) 500 MG 24 hr tablet [id number] Yes TAKE 2 TABLETS(1000 MG) BY MOUTH DAILY WITH BREAKFAST Susy Frizzle, MD Taking Active   metoprolol succinate (TOPROL-XL) 25 MG 24 hr tablet [id number] Yes TAKE 1/2 TABLET EVERY DAY Susy Frizzle, MD Taking Active   Multiple Vitamin (MULTIVITAMIN) tablet [id number] Yes Take 1 tablet by mouth daily. [provider] Taking Active Spouse/Significant Other  nitroGLYCERIN (NITROSTAT) 0.4 MG SL tablet [id number] Yes Place 1 tablet (0.4 mg total) under the tongue every 5 (five) minutes as needed. Susy Frizzle, MD Taking Active   Omega-3 Fatty Acids (FISH OIL) 1000 MG CPDR [id number] Yes Take 1,000 mg by mouth every morning. [provider] Taking Active Spouse/Significant Other  simvastatin (ZOCOR) 10 MG tablet [id number] Yes TAKE 1 TABLET(10 MG) BY MOUTH AT BEDTIME Susy Frizzle, MD Taking Active   tamsulosin (FLOMAX) 0.4 MG CAPS capsule [id number] Yes TAKE 1 CAPSULE (0.4 MG TOTAL) BY MOUTH DAILY. Alycia Rossetti, MD Taking Active             Patient Active Problem List   Diagnosis Date Noted   Thrombocytopenia [facility])    Bilateral carotid artery disease ([place]) 08/04/2016   Poor posture 02/22/2014   Stiffness of joints, not elsewhere classified, multiple sites 02/22/2014   Prediabetes 01/05/2013   Special screening for malignant neoplasms, colon 04/27/2011   Benign neoplasm of  colon 04/27/2011   GERD (gastroesophageal reflux disease)  04/27/2011   Diverticulosis of colon (without mention of hemorrhage) 04/27/2011   CAD (coronary artery disease) 12/27/2010   Environmental allergies 12/27/2010   Insomnia 12/27/2010   H. pylori infection 12/27/2010   Barrett's esophagus 12/27/2010   Hyperlipidemia 11/08/2008   HTN (hypertension) 11/08/2008   GERD 11/08/2008   ACUT PEPTC ULCR UNS SITE W/HEM W/O MENTION OBST 11/08/2008   RENAL CALCULUS 11/08/2008   Obstructive sleep apnea 04/12/2003    Immunization History  Administered Date(s) Administered   Fluad Quad(high Dose 65+) 06/30/2019, 06/18/2020, 08/07/2021   Influenza Split 07/07/2012   Influenza Whole 07/18/2010   Influenza, High Dose Seasonal PF 08/16/2017, 09/07/2018   Influenza,inj,Quad PF,6+ Mos 07/24/2013, 09/10/2014, 08/30/2015, 08/21/2016   Moderna Sars-Covid-2 Vaccination 12/20/2019, 01/17/2020, 10/18/2020   Pneumococcal Conjugate-13 09/10/2014   Pneumococcal Polysaccharide-23 08/16/2007   Td 07/30/2003   Zoster, Live 06/11/2006    Conditions to be addressed/monitored:  HTN, CAD, Barrett's Esophagus, HLD, Pre-DM.    Care Plan : General Pharmacy (Adult)  Updates made by Edythe Clarity, RPH since 10/24/2021 12:00 AM     Problem: HTN, CAD, Barrett's Esophagus, HLD, Pre-DM   Priority: High  Onset Date: 02/06/2021     Long-Range Goal: Patient-Specific Goal   Start Date: 02/06/2021  Expected End Date: 08/08/2021  Recent Progress: On track  Priority: High  Note:   Current Barriers:  Unable to independently monitor therapeutic efficacy Unable to achieve control of BP.   Pharmacist Clinical Goal(s):  Patient will achieve adherence to monitoring guidelines and medication adherence to achieve therapeutic efficacy achieve control of BP as evidenced by home monitoring adhere to prescribed medication regimen as evidenced by fill date/pill box contact provider office for questions/concerns as  evidenced notation of same in electronic health record through collaboration with PharmD and provider.   Interventions: 1:1 collaboration with Susy Frizzle, MD regarding development and update of comprehensive plan of care as evidenced by provider attestation and co-signature Inter-disciplinary care team collaboration (see longitudinal plan of care) Comprehensive medication review performed; medication list updated in electronic medical record  Hypertension (BP goal <140/90) -Controlled -Current treatment: Amlodipine 9m daily Metoprolol XL 243mdaily -Medications previously tried: none noted  -Current home readings: none, patient not checking at home -Current dietary habits: he eats a diabetic conscious diet, watches carbs and sweets -Current exercise habits: active outside with yardwork/gardening -Reports hypotensive/hypertensive symptoms - patient reports feeling very tired often -Educated on BP goals and benefits of medications for prevention of heart attack, stroke and kidney damage; Daily salt intake goal < 2300 mg; Importance of home blood pressure monitoring;  -He denies any swelling -Counseled to monitor BP at home 2 to 3 times per week, document, and provide log at future appointments -Recommended to continue current medication Recommended he monitor BP a few times per week, follow up plan initiated to follow up on readings.  Update 06/19/21 Not checking BP at home. Denies dizziness or Has Continue current meds for now  Hyperlipidemia: (LDL goal < 100) -Controlled -Current treatment: Simvastatin 1064mAppropriate, Effective, Safe, Accessible -Medications previously tried: none noted  -Current dietary patterns: see above -Current exercise habits: see above -Educated on Cholesterol goals;  Benefits of statin for ASCVD risk reduction; Importance of limiting foods high in cholesterol; -Recommended to continue current medication  Update 10/23/21 Most recent LDL is  excellent Continues statin with no concerns. 100% adherence Continue current meds, continue routine screenings  Diabetes (A1c goal <7%) -Controlled -Current medications: Metformin XR 500m64mo tablets daily with  breakfast - Appropriate, Effective, Safe, Accessible  -Medications previously tried: none noted -Current home glucose readings fasting glucose: N/A not checking currently post prandial glucose: N/A not checking currently -Denies hypoglycemic/hyperglycemic symptoms -Current meal patterns: "careful with what he eats", watched carbohydrates and sweets.  Does report he eats a lot of peanut butter.  Currently uses JIF low fat which has 15g of carbs per serving.  Recommended he switch to JIF natural which has about 8g of carbs per serving.  Patient agreeable to plan. -Current exercise: outside working around house -Educated on A1c and blood sugar goals; Complications of diabetes including kidney damage, retinal damage, and cardiovascular disease; Prevention and management of hypoglycemic episodes; Benefits of routine self-monitoring of blood sugar; Continuous glucose monitoring; Carbohydrate content of his PB -Counseled to check feet daily and get yearly eye exams -Recommended to continue current medication Recommended dietary changes to cut back on carbs, follow up A1c in early June.  Update 06/19/21 Patient recently started on Metformin XR 1066m daily due to elvated A1c in June.  He started with one tablet daily and titrated up to the full dose.  He denies any GI side effects.  He is not checking his glucose at home and has labs scheduled for October for f/u A1c.  We discussed ways to cut back on carbohydrates to get A1c down.  Continue meds for now - recheck A1c October. Consider meter if A1c still elevated.  Update 10/23/21 Counseled again on dietary options to keep glucose controlled. He is not checking his sugar at home which at this time I feel is appropriate due to  controlled A1c. He does mention some recent weight loss which is possible with metformin. Denies any symptoms of low blood sugar. Interested in decreasing medication - will recheck his A1c 6 months after this most recent one and if continues to decrease, could consider decreasing back to 501mdaily. No changes at this time, continue routine screenings. A1c controlled at 6.2   Patient Goals/Self-Care Activities Patient will:  - take medications as prescribed focus on medication adherence by pill count check blood pressure a few times per weekl, document, and provide at future appointments engage in dietary modifications by limiting consumption of carbohydrates  Follow Up Plan: The care management team will reach out to the patient again over the next 90 days.            Medication Assistance: None required.  Patient affirms current coverage meets needs.  Patient's preferred [facility] is:  [phone] [id number] [place]: [phone]: [phone] - [place]: [phone]: [phone] Uses pill box? Yes Pt endorses 100% compliance  We discussed: Benefits of medication synchronization, packaging and delivery as well as enhanced pharmacist oversight with [employer]. Patient decided to: Continue current medication management strategy  Care Plan and Follow Up Patient Decision:  Patient agrees to Care Plan and Follow-up.  Plan: The care management team will reach out to the patient again over the next 90 days.  ChBeverly MilchPharmD Clinical Pharmacist BrBroomes [phone]

## 2021-10-23 ENCOUNTER — Ambulatory Visit (INDEPENDENT_AMBULATORY_CARE_PROVIDER_SITE_OTHER): Payer: Medicare HMO | Admitting: Pharmacist

## 2021-10-23 ENCOUNTER — Other Ambulatory Visit: Payer: Self-pay | Admitting: Family Medicine

## 2021-10-23 DIAGNOSIS — E118 Type 2 diabetes mellitus with unspecified complications: Secondary | ICD-10-CM

## 2021-10-23 DIAGNOSIS — E785 Hyperlipidemia, unspecified: Secondary | ICD-10-CM

## 2021-10-24 NOTE — Patient Instructions (Addendum)
Visit Information   Goals Addressed             This Visit's Progress    Track and Manage My Blood Pressure-Hypertension   On track    Timeframe:  Long-Range Goal Priority:  High Start Date:      02/06/21                       Expected End Date:   08/08/21                    Follow Up Date 05/08/21   - check blood pressure 3 times per week - choose a place to take my blood pressure (home, clinic or office, retail store) - write blood pressure results in a log or diary    Why is this important?   You won't feel high blood pressure, but it can still hurt your blood vessels.  High blood pressure can cause heart or kidney problems. It can also cause a stroke.  Making lifestyle changes like losing a little weight or eating less salt will help.  Checking your blood pressure at home and at different times of the day can help to control blood pressure.  If the doctor prescribes medicine remember to take it the way the doctor ordered.  Call the office if you cannot afford the medicine or if there are questions about it.     Notes:        Patient Care Plan: General Pharmacy (Adult)     Problem Identified: HTN, CAD, Barrett's Esophagus, HLD, Pre-DM   Priority: High  Onset Date: 02/06/2021     Long-Range Goal: Patient-Specific Goal   Start Date: 02/06/2021  Expected End Date: 08/08/2021  Recent Progress: On track  Priority: High  Note:   Current Barriers:  Unable to independently monitor therapeutic efficacy Unable to achieve control of BP.   Pharmacist Clinical Goal(s):  Patient will achieve adherence to monitoring guidelines and medication adherence to achieve therapeutic efficacy achieve control of BP as evidenced by home monitoring adhere to prescribed medication regimen as evidenced by fill date/pill box contact provider office for questions/concerns as evidenced notation of same in electronic health record through collaboration with PharmD and provider.    Interventions: 1:1 collaboration with Susy Frizzle, MD regarding development and update of comprehensive plan of care as evidenced by provider attestation and co-signature Inter-disciplinary care team collaboration (see longitudinal plan of care) Comprehensive medication review performed; medication list updated in electronic medical record  Hypertension (BP goal <140/90) -Controlled -Current treatment: Amlodipine 5mg  daily Metoprolol XL 25mg  daily -Medications previously tried: none noted  -Current home readings: none, patient not checking at home -Current dietary habits: he eats a diabetic conscious diet, watches carbs and sweets -Current exercise habits: active outside with yardwork/gardening -Reports hypotensive/hypertensive symptoms - patient reports feeling very tired often -Educated on BP goals and benefits of medications for prevention of heart attack, stroke and kidney damage; Daily salt intake goal < 2300 mg; Importance of home blood pressure monitoring;  -He denies any swelling -Counseled to monitor BP at home 2 to 3 times per week, document, and provide log at future appointments -Recommended to continue current medication Recommended he monitor BP a few times per week, follow up plan initiated to follow up on readings.  Update 06/19/21 Not checking BP at home. Denies dizziness or Has Continue current meds for now  Hyperlipidemia: (LDL goal < 100) -Controlled -Current treatment: Simvastatin 10mg   Appropriate, Effective, Safe, Accessible -Medications previously tried: none noted  -Current dietary patterns: see above -Current exercise habits: see above -Educated on Cholesterol goals;  Benefits of statin for ASCVD risk reduction; Importance of limiting foods high in cholesterol; -Recommended to continue current medication  Update 10/23/21 Most recent LDL is excellent Continues statin with no concerns. 100% adherence Continue current meds, continue routine  screenings  Diabetes (A1c goal <7%) -Controlled -Current medications: Metformin XR 500mg  two tablets daily with breakfast - Appropriate, Effective, Safe, Accessible  -Medications previously tried: none noted -Current home glucose readings fasting glucose: N/A not checking currently post prandial glucose: N/A not checking currently -Denies hypoglycemic/hyperglycemic symptoms -Current meal patterns: "careful with what he eats", watched carbohydrates and sweets.  Does report he eats a lot of peanut butter.  Currently uses JIF low fat which has 15g of carbs per serving.  Recommended he switch to JIF natural which has about 8g of carbs per serving.  Patient agreeable to plan. -Current exercise: outside working around house -Educated on A1c and blood sugar goals; Complications of diabetes including kidney damage, retinal damage, and cardiovascular disease; Prevention and management of hypoglycemic episodes; Benefits of routine self-monitoring of blood sugar; Continuous glucose monitoring; Carbohydrate content of his PB -Counseled to check feet daily and get yearly eye exams -Recommended to continue current medication Recommended dietary changes to cut back on carbs, follow up A1c in early June.  Update 06/19/21 Patient recently started on Metformin XR 1000mg  daily due to elvated A1c in June.  He started with one tablet daily and titrated up to the full dose.  He denies any GI side effects.  He is not checking his glucose at home and has labs scheduled for October for f/u A1c.  We discussed ways to cut back on carbohydrates to get A1c down.  Continue meds for now - recheck A1c October. Consider meter if A1c still elevated.  Update 10/23/21 Counseled again on dietary options to keep glucose controlled. He is not checking his sugar at home which at this time I feel is appropriate due to controlled A1c. He does mention some recent weight loss which is possible with metformin. Denies any  symptoms of low blood sugar. Interested in decreasing medication - will recheck his A1c 6 months after this most recent one and if continues to decrease, could consider decreasing back to 500mg  daily. No changes at this time, continue routine screenings. A1c controlled at 6.2   Patient Goals/Self-Care Activities Patient will:  - take medications as prescribed focus on medication adherence by pill count check blood pressure a few times per weekl, document, and provide at future appointments engage in dietary modifications by limiting consumption of carbohydrates  Follow Up Plan: The care management team will reach out to the patient again over the next 90 days.          Patient verbalizes understanding of instructions and care plan provided today and agrees to view in [place]. Active MyChart status confirmed with patient.   Telephone follow up appointment with pharmacy team member scheduled for: 6 months  Edythe Clarity, [place], PharmD, [place] Clinical Pharmacist Practitioner [place] [phone]

## 2021-11-04 ENCOUNTER — Ambulatory Visit (INDEPENDENT_AMBULATORY_CARE_PROVIDER_SITE_OTHER): Payer: Medicare HMO | Admitting: Family Medicine

## 2021-11-04 ENCOUNTER — Other Ambulatory Visit: Payer: Self-pay

## 2021-11-04 ENCOUNTER — Encounter: Payer: Self-pay | Admitting: Family Medicine

## 2021-11-04 VITALS — BP 104/52 | HR 71 | Temp 98.1°F | Resp 18 | Ht 69.0 in | Wt 152.0 lb

## 2021-11-04 DIAGNOSIS — R1032 Left lower quadrant pain: Secondary | ICD-10-CM | POA: Diagnosis not present

## 2021-11-04 NOTE — Progress Notes (Signed)
Subjective:    Patient ID: Andrew [name], male    DOB: April 12, 1941, 81 y.o.   MRN: [id number] Patient states that Thursday he developed left lower quadrant abdominal pain.  It began gradually throughout the day and intensified.  By Thursday evening it subsided.  Friday gradually improved throughout the day.  He has not had any pain since Friday.  Has been pain-free since Saturday Sunday and Monday.  Typically the patient has diarrhea however he states that over the last month he is going 2 or 3 days without having a bowel movement.  He cannot recall the last time he had a bowel movement.  He denies any melena or hematochezia.  He denies any diarrhea.  He denies any fevers or chills.  He denies any nausea vomiting.  He denies any dysuria or hematuria or urgency or frequency.  Today on exam his abdomen is soft nondistended nontender with normal bowel sounds and no guarding and no rebound Past Medical History:  Diagnosis Date   Allergy    grass etc.   Barrett's esophagus    BPH (benign prostatic hyperplasia)    Chest pain, atypical    Chronic kidney disease    kidney stones   Coronary artery disease    GERD (gastroesophageal reflux disease)    Barrett's   Hernia    Hyperlipidemia    Hypertension    Myocardial infarction ([place])    Thrombocytopenia ([place])    Ulcer 1960   Past Surgical History:  Procedure Laterality Date   CARDIAC CATHETERIZATION  02/14/2007   MITRAL REGURGITATION. LV NORMAL   CHOLECYSTECTOMY     COLONOSCOPY     CORONARY ARTERY BYPASS GRAFT     twice  2 by passes each time   INGUINAL HERNIA REPAIR     bilateral done twice   LITHOTRIPSY     UPPER GASTROINTESTINAL ENDOSCOPY     Current Outpatient Medications on File Prior to Visit  Medication Sig Dispense Refill   Accu-Chek Softclix Lancets lancets Use as instructed to monitor FSBS 1x daily. Dx: E11.9 100 each 1   Alcohol Swabs (B-D SINGLE USE SWABS REGULAR) PADS Use as instructed to monitor FSBS 1x daily. Dx: E11.9 100  each 1   amLODipine (NORVASC) 5 MG tablet TAKE 1 TABLET(5 MG) BY MOUTH DAILY 90 tablet 3   aspirin 81 MG tablet Take 81 mg by mouth daily.     Blood Glucose Monitoring Suppl (ACCU-CHEK GUIDE ME) w/Device KIT Use as instructed to monitor FSBS 1x daily. Dx: E11.9 1 kit 1   Cholecalciferol (VITAMIN D) 2000 units CAPS Take 1 capsule by mouth daily.     finasteride (PROSCAR) 5 MG tablet TAKE 1 TABLET(5 MG) BY MOUTH DAILY. 90 tablet 3   glucose blood (ACCU-CHEK GUIDE) test strip Use as instructed to monitor FSBS 1x daily. Dx: E11.9 100 each 1   lansoprazole (PREVACID) 30 MG capsule Take 1 capsule (30 mg total) by mouth daily. 90 capsule 3   metFORMIN (GLUCOPHAGE-XR) 500 MG 24 hr tablet TAKE 2 TABLETS(1000 MG) BY MOUTH DAILY WITH BREAKFAST 60 tablet 3   metoprolol succinate (TOPROL-XL) 25 MG 24 hr tablet TAKE 1/2 TABLET EVERY DAY 45 tablet 3   Multiple Vitamin (MULTIVITAMIN) tablet Take 1 tablet by mouth daily.     nitroGLYCERIN (NITROSTAT) 0.4 MG SL tablet Place 1 tablet (0.4 mg total) under the tongue every 5 (five) minutes as needed. 45 tablet 3   Omega-3 Fatty Acids (FISH OIL) 1000 MG CPDR Take  1,000 mg by mouth every morning.     simvastatin (ZOCOR) 10 MG tablet TAKE 1 TABLET(10 MG) BY MOUTH AT BEDTIME 90 tablet 3   tamsulosin (FLOMAX) 0.4 MG CAPS capsule TAKE 1 CAPSULE (0.4 MG TOTAL) BY MOUTH DAILY. 90 capsule 1   No current facility-administered medications on file prior to visit.   Allergies  Allergen Reactions   Imdur [Isosorbide Mononitrate]     Unknown, per pt    Meloxicam Swelling    Facial swelling    Social History   Socioeconomic History   Marital status: Married    Spouse name: [place]   Number of children: 2   Years of education: Not on file   Highest education level: Not on file  Occupational History   Occupation: Retired  Tobacco Use   Smoking status: Never   Smokeless tobacco: Never  Vaping Use   Vaping Use: Never used  Substance and Sexual Activity   Alcohol use:  No   Drug use: No   Sexual activity: Not Currently  Other Topics Concern   Not on file  Social History Narrative   Married in 1961.   6 grandchildren   Social Determinants of Health   Financial Resource Strain: Low Risk    Difficulty of Paying Living Expenses: Not hard at all  Food Insecurity: No Food Insecurity   Worried About Charity fundraiser in the Last Year: Never true   Arboriculturist in the Last Year: Never true  Transportation Needs: No Transportation Needs   Lack of Transportation (Medical): No   Lack of Transportation (Non-Medical): No  Physical Activity: Insufficiently Active   Days of Exercise per Week: 5 days   Minutes of Exercise per Session: 20 min  Stress: No Stress Concern Present   Feeling of Stress : Not at all  Social Connections: Socially Integrated   Frequency of Communication with Friends and Family: Three times a week   Frequency of Social Gatherings with Friends and Family: Three times a week   Attends Religious Services: More than 4 times per year   Active Member of Clubs or Organizations: Yes   Attends Music therapist: More than 4 times per year   Marital Status: Married  Human resources officer Violence: Not At Risk   Fear of Current or Ex-Partner: No   Emotionally Abused: No   Physically Abused: No   Sexually Abused: No      Review of Systems  All other systems reviewed and are negative.     Objective:   Physical Exam Vitals reviewed.  Constitutional:      Appearance: Normal appearance.  Cardiovascular:     Rate and Rhythm: Normal rate and regular rhythm.     Heart sounds: Normal heart sounds. No murmur heard.   No friction rub. No gallop.  Pulmonary:     Effort: Pulmonary effort is normal. No respiratory distress.     Breath sounds: Normal breath sounds. No wheezing, rhonchi or rales.  Abdominal:     General: Abdomen is flat. Bowel sounds are normal.     Palpations: Abdomen is soft.  Musculoskeletal:     Right lower  leg: No edema.     Left lower leg: No edema.  Neurological:     Mental Status: He is alert.          Assessment & Plan:  LLQ abdominal pain - Plan: CBC with Differential/Platelet, COMPLETE METABOLIC PANEL WITH GFR, DG Abd 2 Views Given the location  of the pain and the history, I suspect constipation causing abdominal pain or potentially diverticular pain.  However the patient is pain-free now.  I suggest that he start taking Colace on a daily basis to try to maintain consistency.  I will check a CBC to rule out any leukocytosis and evaluate a CMP to look for any electrolyte disturbances or elevated liver function test to suggest a more serious underlying cause of abdominal pain.  Also get an x-ray of the abdomen to evaluate for any large stool burden that may require evacuation

## 2021-11-05 ENCOUNTER — Ambulatory Visit
Admission: RE | Admit: 2021-11-05 | Discharge: 2021-11-05 | Disposition: A | Discharge status: Home / Self Care | Payer: Medicare HMO | Source: Ambulatory Visit | Attending: Family Medicine | Admitting: Family Medicine

## 2021-11-05 DIAGNOSIS — R1032 Left lower quadrant pain: Secondary | ICD-10-CM

## 2021-11-05 LAB — CBC WITH DIFFERENTIAL/PLATELET
Absolute Monocytes: 490 cells/uL (ref 200–950)
Basophils Absolute: 34 cells/uL (ref 0–200)
Basophils Relative: 0.4 %
Eosinophils Absolute: 52 cells/uL (ref 15–500)
Eosinophils Relative: 0.6 %
HCT: 39.9 % (ref 38.5–50.0)
Hemoglobin: 13.4 g/dL (ref 13.2–17.1)
Lymphs Abs: 1299 cells/uL (ref 850–3900)
MCH: 29.9 pg (ref 27.0–33.0)
MCHC: 33.6 g/dL (ref 32.0–36.0)
MCV: 89.1 fL (ref 80.0–100.0)
MPV: 11.3 fL (ref 7.5–12.5)
Monocytes Relative: 5.7 %
Neutro Abs: 6725 cells/uL (ref 1500–7800)
Neutrophils Relative %: 78.2 %
Platelets: 91 10*3/uL — ABNORMAL LOW (ref 140–400)
RBC: 4.48 10*6/uL (ref 4.20–5.80)
RDW: 12 % (ref 11.0–15.0)
Total Lymphocyte: 15.1 %
WBC: 8.6 10*3/uL (ref 3.8–10.8)

## 2021-11-05 LAB — COMPLETE METABOLIC PANEL WITH GFR
AG Ratio: 2.1 (calc) (ref 1.0–2.5)
ALT: 13 U/L (ref 9–46)
AST: 15 U/L (ref 10–35)
Albumin: 4.1 g/dL (ref 3.6–5.1)
Alkaline phosphatase (APISO): 49 U/L (ref 35–144)
BUN: 22 mg/dL (ref 7–25)
CO2: 29 mmol/L (ref 20–32)
Calcium: 9.2 mg/dL (ref 8.6–10.3)
Chloride: 104 mmol/L (ref 98–110)
Creat: 0.95 mg/dL (ref 0.70–1.22)
Globulin: 2 g/dL (calc) (ref 1.9–3.7)
Glucose, Bld: 129 mg/dL — ABNORMAL HIGH (ref 65–99)
Potassium: 4.1 mmol/L (ref 3.5–5.3)
Sodium: 141 mmol/L (ref 135–146)
Total Bilirubin: 0.5 mg/dL (ref 0.2–1.2)
Total Protein: 6.1 g/dL (ref 6.1–8.1)
eGFR: 80 mL/min/{1.73_m2} (ref >=60)

## 2021-11-06 ENCOUNTER — Telehealth: Payer: Self-pay

## 2021-11-06 NOTE — Telephone Encounter (Signed)
Patient informed of results and recommendations

## 2021-11-06 NOTE — Telephone Encounter (Signed)
-----   Message from Susy Frizzle, MD sent at 11/06/2021  7:04 AM EST ----- X-ray suggest a large stool burden and the constipation may be the primary cause of his pain.  Recommend starting MiraLAX daily to help alleviate stool burden.  I suspect this is the cause of his pain

## 2021-11-10 ENCOUNTER — Encounter: Payer: Self-pay | Admitting: Family Medicine

## 2021-11-11 ENCOUNTER — Telehealth: Payer: Self-pay

## 2021-11-11 DIAGNOSIS — E785 Hyperlipidemia, unspecified: Secondary | ICD-10-CM | POA: Diagnosis not present

## 2021-11-11 DIAGNOSIS — E118 Type 2 diabetes mellitus with unspecified complications: Secondary | ICD-10-CM | POA: Diagnosis not present

## 2021-11-11 MED ORDER — METOPROLOL SUCCINATE ER 25 MG PO TB24: 12.5000 mg | ORAL_TABLET | Freq: Every day | ORAL | 3 refills | Status: DC

## 2021-11-11 NOTE — Telephone Encounter (Signed)
Metoprolol rx sent to pharmacy.  Left message informing patient.

## 2021-11-11 NOTE — Telephone Encounter (Signed)
Pt called in asking to get an override prescription of metoprolol succinate (TOPROL-XL) 25 MG sent to [facility] on [address]. Pt states that he is out of this med, and is asking for a 7 - 10 day supply of med until mail order of this med arrives. Please advise.  Cb#: [phone]

## 2022-01-14 NOTE — Addendum Note (Signed)
Addended by: Colman Cater on: 01/14/2022 05:25 PM ? ? Modules accepted: Orders ? ?

## 2022-01-15 ENCOUNTER — Other Ambulatory Visit: Payer: Self-pay | Admitting: Family Medicine

## 2022-01-15 ENCOUNTER — Other Ambulatory Visit: Payer: Self-pay

## 2022-01-15 ENCOUNTER — Other Ambulatory Visit: Payer: Medicare HMO

## 2022-01-15 DIAGNOSIS — I10 Essential (primary) hypertension: Secondary | ICD-10-CM | POA: Diagnosis not present

## 2022-01-15 DIAGNOSIS — R7303 Prediabetes: Secondary | ICD-10-CM | POA: Diagnosis not present

## 2022-01-15 MED ORDER — LEVOCETIRIZINE DIHYDROCHLORIDE 5 MG PO TABS
5.0000 mg | ORAL_TABLET | Freq: Every evening | ORAL | 1 refills | Status: DC
Start: 2022-01-15 — End: 2022-08-07

## 2022-01-15 MED ORDER — OLOPATADINE HCL 0.1 % OP SOLN: 1.0000 [drp] | Freq: Two times a day (BID) | OPHTHALMIC | 12 refills | Status: DC

## 2022-01-15 NOTE — Progress Notes (Signed)
Patient requested allergy meds sent in. Rxs sent to pharmacy.  ?

## 2022-01-16 LAB — EXTRA SPECIMEN

## 2022-01-16 LAB — HEMOGLOBIN A1C
Hgb A1c MFr Bld: 6.4 % of total Hgb — ABNORMAL HIGH (ref <5.7)
Mean Plasma Glucose: 137 mg/dL
eAG (mmol/L): 7.6 mmol/L

## 2022-01-20 ENCOUNTER — Telehealth: Payer: Self-pay | Admitting: Family Medicine

## 2022-01-20 MED ORDER — AMLODIPINE BESYLATE 5 MG PO TABS: ORAL_TABLET | ORAL | 3 refills | Status: DC

## 2022-01-20 MED ORDER — METFORMIN HCL ER 500 MG PO TB24: ORAL_TABLET | ORAL | 3 refills | Status: DC

## 2022-01-20 MED ORDER — TAMSULOSIN HCL 0.4 MG PO CAPS: 0.4000 mg | ORAL_CAPSULE | Freq: Every day | ORAL | 1 refills | Status: DC

## 2022-01-20 NOTE — Telephone Encounter (Signed)
Amlodipine and Metformin sent to [facility] ? ?Tamsulosin last sent 06/14/2020, #90, 1 refill - this has not been sent since ? ?Please advise on Tamsulosin, thanks! ?

## 2022-01-20 NOTE — Telephone Encounter (Signed)
Received eFax from pharmacy to request refill of ? ?amLODipine (NORVASC) 5 MG tablet [[id number]]  ? ?metFORMIN (GLUCOPHAGE-XR) 500 MG 24 hr tablet [[id number]]  ? ?tamsulosin (FLOMAX) 0.4 MG CAPS capsule [[id number]] ? ?Efax received from ? ?[facility], [address]  ?[place], [place] [address]  ?Phone:  [phone]  Fax:  [phone]  ? ?Please advise pharmacist.  ?

## 2022-01-20 NOTE — Telephone Encounter (Signed)
Tamsulosin sent to pharmacy.  ?

## 2022-01-21 ENCOUNTER — Telehealth: Payer: Self-pay

## 2022-01-21 NOTE — Telephone Encounter (Signed)
Called pt to verify which pharmacy pt want refill to go to 01/21/22  ?

## 2022-01-21 NOTE — Telephone Encounter (Signed)
[facility] pharmacy faxed a refill request for  ? ?lansoprazole (PREVACID) 30 MG capsule [[id number]]  ?  Order Details ?Dose: 30 mg Route: Oral Frequency: Daily  ?Dispense Quantity: 90 capsule Refills: 3   ?     ?Sig: Take 1 capsule (30 mg total) by mouth daily.  ?     ?Start Date: 10/10/21 End Date: --  ?Written Date: 10/10/21 Expiration Date: 10/10/22  ?Original Order:  lansoprazole (PREVACID) 30 MG capsule [[id number]]  ? ?

## 2022-01-28 ENCOUNTER — Other Ambulatory Visit: Payer: Self-pay

## 2022-01-28 DIAGNOSIS — R7303 Prediabetes: Secondary | ICD-10-CM

## 2022-02-03 NOTE — Telephone Encounter (Signed)
Have not heard from pt. Refuse refill.  ?

## 2022-02-07 ENCOUNTER — Ambulatory Visit
Admission: EM | Admit: 2022-02-07 | Discharge: 2022-02-07 | Disposition: A | Discharge status: Home / Self Care | Payer: Medicare HMO | Attending: Nurse Practitioner | Admitting: Nurse Practitioner

## 2022-02-07 ENCOUNTER — Other Ambulatory Visit: Payer: Self-pay

## 2022-02-07 ENCOUNTER — Encounter: Payer: Self-pay | Admitting: *Deleted

## 2022-02-07 DIAGNOSIS — K047 Periapical abscess without sinus: Secondary | ICD-10-CM

## 2022-02-07 DIAGNOSIS — K0889 Other specified disorders of teeth and supporting structures: Secondary | ICD-10-CM | POA: Diagnosis not present

## 2022-02-07 MED ORDER — AMOXICILLIN 500 MG PO CAPS: 500.0000 mg | ORAL_CAPSULE | Freq: Three times a day (TID) | ORAL | 0 refills | Status: DC

## 2022-02-07 NOTE — ED Triage Notes (Signed)
Pt reports his face swelling started yesterday and Pt also reports he has a bad tooth. ?

## 2022-02-07 NOTE — Discharge Instructions (Signed)
Take medication as prescribed. ?Continue applying ice to the affected area to help with pain and swelling. ?Warm salt water gargles 3-4 times daily or as as needed for pain or swelling. ?Eat foods that are soft until you have been seen by the dentist. ?Continue Tylenol.  Recommend taking Tylenol extra strength 500 mg 1 tablet every 8 hours as needed for pain. ?I am also providing a dental resource guide for you in case you have trouble getting into see your dentist. ?Follow-up as needed. ?

## 2022-02-07 NOTE — ED Provider Notes (Signed)
?[facility] ? ? ? ?CSN: [id number] ?Arrival date & time: 02/07/22  4098 ? ? ?  ? ?History   ?Chief Complaint ?Chief Complaint  ?Patient presents with  ? Dental Problem  ? Facial Swelling  ? ? ?HPI ?Andrew [name] is a 81 y.o. male.  ? ?The patient is an 81 year old male who presents with swelling in the left face x1 day.  Patient states pain started with a tooth ache.  He states the pain is in the left upper central side of his mouth.  States "I have a bad tooth on that side".  Patient states symptoms worsen with chewing, swallowing, and talking.  He denies fever, chills, abdominal pain, fatigue, nausea or vomiting.  States he has been taking Tylenol and applying ice to the affected area.  He has not been able to contact his dentist as he reports they are not open on the weekend. ? ?The history is provided by the patient.  ? ?Past Medical History:  ?Diagnosis Date  ? Allergy   ? grass etc.  ? Barrett's esophagus   ? BPH (benign prostatic hyperplasia)   ? Chest pain, atypical   ? Chronic kidney disease   ? kidney stones  ? Coronary artery disease   ? GERD (gastroesophageal reflux disease)   ? Barrett's  ? Hernia   ? Hyperlipidemia   ? Hypertension   ? Myocardial infarction [facility])   ? Thrombocytopenia ([place])   ? Ulcer 1960  ? ? ?Patient Active Problem List  ? Diagnosis Date Noted  ? Thrombocytopenia ([place])   ? Bilateral carotid artery disease ([place]) 08/04/2016  ? Poor posture 02/22/2014  ? Stiffness of joints, not elsewhere classified, multiple sites 02/22/2014  ? Prediabetes 01/05/2013  ? Special screening for malignant neoplasms, colon 04/27/2011  ? Benign neoplasm of colon 04/27/2011  ? GERD (gastroesophageal reflux disease) 04/27/2011  ? Diverticulosis of colon (without mention of hemorrhage) 04/27/2011  ? CAD (coronary artery disease) 12/27/2010  ? Environmental allergies 12/27/2010  ? Insomnia 12/27/2010  ? H. pylori infection 12/27/2010  ? Barrett's esophagus 12/27/2010  ? Hyperlipidemia 11/08/2008  ? HTN  (hypertension) 11/08/2008  ? GERD 11/08/2008  ? ACUT PEPTC ULCR UNS SITE W/HEM W/O MENTION OBST 11/08/2008  ? RENAL CALCULUS 11/08/2008  ? Obstructive sleep apnea 04/12/2003  ? ? ?Past Surgical History:  ?Procedure Laterality Date  ? CARDIAC CATHETERIZATION  02/14/2007  ? MITRAL REGURGITATION. LV NORMAL  ? CHOLECYSTECTOMY    ? COLONOSCOPY    ? CORONARY ARTERY BYPASS GRAFT    ? twice  2 by passes each time  ? INGUINAL HERNIA REPAIR    ? bilateral done twice  ? LITHOTRIPSY    ? UPPER GASTROINTESTINAL ENDOSCOPY    ? ? ? ? ? ?Home Medications   ? ?Prior to Admission medications   ?Medication Sig Start Date End Date Taking? Authorizing Provider  ?Accu-Chek Softclix Lancets lancets Use as instructed to monitor FSBS 1x daily. Dx: E11.9 07/15/21   Susy Frizzle, MD  ?Alcohol Swabs (B-D SINGLE USE SWABS REGULAR) PADS Use as instructed to monitor FSBS 1x daily. Dx: E11.9 07/15/21   Susy Frizzle, MD  ?amLODipine (NORVASC) 5 MG tablet TAKE 1 TABLET(5 MG) BY MOUTH DAILY 01/20/22   Susy Frizzle, MD  ?aspirin 81 MG tablet Take 81 mg by mouth daily.    [provider]  ?Blood Glucose Monitoring Suppl (ACCU-CHEK GUIDE ME) w/Device KIT Use as instructed to monitor FSBS 1x daily. Dx: E11.9 07/15/21  Susy Frizzle, MD  ?Cholecalciferol (VITAMIN D) 2000 units CAPS Take 1 capsule by mouth daily.    [provider]  ?finasteride (PROSCAR) 5 MG tablet TAKE 1 TABLET(5 MG) BY MOUTH DAILY. 10/22/20   Susy Frizzle, MD  ?glucose blood (ACCU-CHEK GUIDE) test strip Use as instructed to monitor FSBS 1x daily. Dx: E11.9 07/15/21   Susy Frizzle, MD  ?lansoprazole (PREVACID) 30 MG capsule Take 1 capsule (30 mg total) by mouth daily. 10/10/21   Susy Frizzle, MD  ?levocetirizine (XYZAL) 5 MG tablet Take 1 tablet (5 mg total) by mouth every evening. 01/15/22   Susy Frizzle, MD  ?metFORMIN (GLUCOPHAGE-XR) 500 MG 24 hr tablet TAKE 2 TABLETS(1000 MG) BY MOUTH DAILY WITH BREAKFAST 01/20/22   Susy Frizzle,  MD  ?metoprolol succinate (TOPROL-XL) 25 MG 24 hr tablet Take 0.5 tablets (12.5 mg total) by mouth daily. 11/11/21   Susy Frizzle, MD  ?Multiple Vitamin (MULTIVITAMIN) tablet Take 1 tablet by mouth daily.    [provider]  ?nitroGLYCERIN (NITROSTAT) 0.4 MG SL tablet Place 1 tablet (0.4 mg total) under the tongue every 5 (five) minutes as needed. 09/17/21   Susy Frizzle, MD  ?olopatadine (PATADAY) 0.1 % ophthalmic solution Place 1 drop into both eyes 2 (two) times daily. 01/15/22   Susy Frizzle, MD  ?Omega-3 Fatty Acids (FISH OIL) 1000 MG CPDR Take 1,000 mg by mouth every morning.    [provider]  ?simvastatin (ZOCOR) 10 MG tablet TAKE 1 TABLET(10 MG) BY MOUTH AT BEDTIME 01/31/21   Susy Frizzle, MD  ?tamsulosin (FLOMAX) 0.4 MG CAPS capsule Take 1 capsule (0.4 mg total) by mouth daily. 01/20/22   Susy Frizzle, MD  ? ? ?Family History ?Family History  ?Problem Relation Age of Onset  ? Stroke Father   ? Heart attack Mother   ? Heart attack Maternal Grandmother   ? Heart attack Maternal Grandfather   ? Heart disease Sister   ? Heart disease Brother   ? Heart disease Sister   ? Heart disease Brother   ? Prostate cancer Brother   ? Colon cancer Neg Hx   ? Esophageal cancer Neg Hx   ? Rectal cancer Neg Hx   ? Stomach cancer Neg Hx   ? ? ?Social History ?Social History  ? ?Tobacco Use  ? Smoking status: Never  ? Smokeless tobacco: Never  ?Vaping Use  ? Vaping Use: Never used  ?Substance Use Topics  ? Alcohol use: No  ? Drug use: No  ? ? ? ?Allergies   ?Imdur [isosorbide mononitrate] and Meloxicam ? ? ?Review of Systems ?Review of Systems  ?Constitutional: Negative.   ?HENT:  Positive for dental problem and facial swelling (left).   ?Cardiovascular: Negative.   ?Gastrointestinal: Negative.   ?Skin: Negative.   ?Psychiatric/Behavioral: Negative.    ? ? ?Physical Exam ?Triage Vital Signs ?ED Triage Vitals  ?Enc Vitals Group  ?   BP 02/07/22 0837 109/63  ?   Pulse Rate 02/07/22 0837 70   ?   Resp 02/07/22 0837 18  ?   Temp 02/07/22 0837 97.8 ?F (36.6 ?C)  ?   Temp Source 02/07/22 0837 Oral  ?   SpO2 02/07/22 0837 97 %  ?   Weight --   ?   Height --   ?   Head Circumference --   ?   Peak Flow --   ?   Pain Score 02/07/22 0842 4  ?  Pain Loc --   ?   Pain Edu? --   ?   Excl. in [place]? --   ? ?No data found. ? ?Updated Vital Signs ?BP 109/63   Pulse 70   Temp 97.8 ?F (36.6 ?C) (Oral)   Resp 18   SpO2 97%  ? ?Visual Acuity ?Right Eye Distance:   ?Left Eye Distance:   ?Bilateral Distance:   ? ?Right Eye Near:   ?Left Eye Near:    ?Bilateral Near:    ? ?Physical Exam ?Vitals reviewed.  ?Constitutional:   ?   Appearance: Normal appearance.  ?HENT:  ?   Head: Normocephalic and atraumatic.  ?   Right Ear: Tympanic membrane, ear canal and external ear normal.  ?   Left Ear: Tympanic membrane, ear canal and external ear normal.  ?   Nose: Nose normal.  ?   Mouth/Throat:  ?   Mouth: Mucous membranes are moist.  ?   Dentition: Abnormal dentition. Dental tenderness, gingival swelling, dental caries and dental abscesses present.  ? ?Eyes:  ?   Extraocular Movements: Extraocular movements intact.  ?   Pupils: Pupils are equal, round, and reactive to light.  ?Cardiovascular:  ?   Rate and Rhythm: Normal rate and regular rhythm.  ?   Pulses: Normal pulses.  ?   Heart sounds: Normal heart sounds.  ?Pulmonary:  ?   Effort: Pulmonary effort is normal.  ?   Breath sounds: Normal breath sounds.  ?Abdominal:  ?   General: Bowel sounds are normal.  ?   Palpations: Abdomen is soft.  ?Skin: ?   General: Skin is warm and dry.  ?Neurological:  ?   General: No focal deficit present.  ?   Mental Status: He is alert and oriented to person, place, and time.  ?Psychiatric:     ?   Mood and Affect: Mood normal.     ?   Behavior: Behavior normal.  ? ? ? ?UC Treatments / Results  ?Labs ?(all labs ordered are listed, but only abnormal results are displayed) ?Labs Reviewed - No data to display ? ?EKG ? ? ?Radiology ?No results  found. ? ?Procedures ?Procedures (including critical care time) ? ?Medications Ordered in UC ?Medications - No data to display ? ?Initial Impression / Assessment and Plan / UC Course  ?I have reviewed the triage

## 2022-02-09 DIAGNOSIS — R69 Illness, unspecified: Secondary | ICD-10-CM | POA: Diagnosis not present

## 2022-03-25 ENCOUNTER — Inpatient Hospital Stay (HOSPITAL_COMMUNITY): Payer: Medicare HMO | Attending: Physician Assistant

## 2022-03-25 DIAGNOSIS — D696 Thrombocytopenia, unspecified: Secondary | ICD-10-CM | POA: Insufficient documentation

## 2022-03-25 DIAGNOSIS — R7402 Elevation of levels of lactic acid dehydrogenase (LDH): Secondary | ICD-10-CM | POA: Insufficient documentation

## 2022-03-25 DIAGNOSIS — R599 Enlarged lymph nodes, unspecified: Secondary | ICD-10-CM | POA: Insufficient documentation

## 2022-03-30 ENCOUNTER — Other Ambulatory Visit: Payer: Self-pay | Admitting: Family Medicine

## 2022-03-31 ENCOUNTER — Inpatient Hospital Stay (HOSPITAL_COMMUNITY): Payer: Medicare HMO

## 2022-03-31 DIAGNOSIS — R7402 Elevation of levels of lactic acid dehydrogenase (LDH): Secondary | ICD-10-CM | POA: Diagnosis not present

## 2022-03-31 DIAGNOSIS — D696 Thrombocytopenia, unspecified: Secondary | ICD-10-CM | POA: Diagnosis not present

## 2022-03-31 DIAGNOSIS — R599 Enlarged lymph nodes, unspecified: Secondary | ICD-10-CM | POA: Diagnosis not present

## 2022-03-31 LAB — CBC WITH DIFFERENTIAL/PLATELET
Abs Immature Granulocytes: 0.03 10*3/uL (ref 0.00–0.07)
Basophils Absolute: 0.1 10*3/uL (ref 0.0–0.1)
Basophils Relative: 1 %
Eosinophils Absolute: 0.1 10*3/uL (ref 0.0–0.5)
Eosinophils Relative: 1 %
HCT: 40 % (ref 39.0–52.0)
Hemoglobin: 13.5 g/dL (ref 13.0–17.0)
Immature Granulocytes: 0 %
Lymphocytes Relative: 21 %
Lymphs Abs: 1.5 10*3/uL (ref 0.7–4.0)
MCH: 29.9 pg (ref 26.0–34.0)
MCHC: 33.8 g/dL (ref 30.0–36.0)
MCV: 88.5 fL (ref 80.0–100.0)
Monocytes Absolute: 0.4 10*3/uL (ref 0.1–1.0)
Monocytes Relative: 6 %
Neutro Abs: 4.9 10*3/uL (ref 1.7–7.7)
Neutrophils Relative %: 71 %
Platelets: 169 10*3/uL (ref 150–400)
RBC: 4.52 MIL/uL (ref 4.22–5.81)
RDW: 12.3 % (ref 11.5–15.5)
WBC: 7 10*3/uL (ref 4.0–10.5)
nRBC: 0 % (ref 0.0–0.2)

## 2022-03-31 LAB — LACTATE DEHYDROGENASE: LDH: 262 U/L — ABNORMAL HIGH (ref 98–192)

## 2022-04-01 ENCOUNTER — Inpatient Hospital Stay (HOSPITAL_COMMUNITY): Payer: Medicare HMO | Admitting: Physician Assistant

## 2022-04-01 NOTE — Progress Notes (Deleted)
[place],  [id number]   CLINIC:  Medical Oncology/Hematology  PCP:  Susy Frizzle, MD [address] [place] [address] [phone]   REASON FOR VISIT:  Follow-up for ***  PRIOR THERAPY: ***  CURRENT THERAPY: ***  INTERVAL HISTORY:  Andrew [name] 81 y.o. male returns for routine follow-up of ***  At today's visit, he reports feeling ***.  No recent hospitalizations, surgeries, or changes in baseline health status.  *** No fatigue, fever, chills, shortness of breath, cough, chest pain, nausea, vomiting, abdominal pain.  Denies any signs or symptoms of blood loss.  No current signs or symptoms of blood clots.  He has ***% energy and ***% appetite. He endorses that he is maintaining a stable weight.    REVIEW OF SYSTEMS:  Review of Systems - Oncology    PAST MEDICAL/SURGICAL HISTORY:  Past Medical History:  Diagnosis Date   Allergy    grass etc.   Barrett's esophagus    BPH (benign prostatic hyperplasia)    Chest pain, atypical    Chronic kidney disease    kidney stones   Coronary artery disease    GERD (gastroesophageal reflux disease)    Barrett's   Hernia    Hyperlipidemia    Hypertension    Myocardial infarction ([place])    Thrombocytopenia ([place])    Ulcer 1960   Past Surgical History:  Procedure Laterality Date   CARDIAC CATHETERIZATION  02/14/2007   MITRAL REGURGITATION. LV NORMAL   CHOLECYSTECTOMY     COLONOSCOPY     CORONARY ARTERY BYPASS GRAFT     twice  2 by passes each time   INGUINAL HERNIA REPAIR     bilateral done twice   LITHOTRIPSY     UPPER GASTROINTESTINAL ENDOSCOPY       SOCIAL HISTORY:  Social History   Socioeconomic History   Marital status: Married    Spouse name: Raymond   Number of children: 2   Years of education: Not on file   Highest education level: Not on file  Occupational History   Occupation: Retired  Tobacco Use   Smoking status: Never   Smokeless tobacco:  Never  Vaping Use   Vaping Use: Never used  Substance and Sexual Activity   Alcohol use: No   Drug use: No   Sexual activity: Not Currently  Other Topics Concern   Not on file  Social History Narrative   Married in 1961.   6 grandchildren   Social Determinants of Health   Financial Resource Strain: Low Risk  (09/26/2021)   Overall Financial Resource Strain (CARDIA)    Difficulty of Paying Living Expenses: Not hard at all  Food Insecurity: No Food Insecurity (09/26/2021)   Hunger Vital Sign    Worried About Running Out of Food in the Last Year: Never true    [place] in the Last Year: Never true  Transportation Needs: No Transportation Needs (09/26/2021)   PRAPARE - Hydrologist (Medical): No    Lack of Transportation (Non-Medical): No  Physical Activity: Insufficiently Active (09/26/2021)   Exercise Vital Sign    Days of Exercise per Week: 5 days    Minutes of Exercise per Session: 20 min  Stress: No Stress Concern Present (09/26/2021)   [place]    Feeling of Stress : Not at all  Social Connections: [place] (09/26/2021)  Social Connection and Isolation Panel [NHANES]    Frequency of Communication with Friends and Family: Three times a week    Frequency of Social Gatherings with Friends and Family: Three times a week    Attends Religious Services: More than 4 times per year    Active Member of Clubs or Organizations: Yes    Attends Archivist Meetings: More than 4 times per year    Marital Status: Married  Human resources officer Violence: Not At Risk (09/26/2021)   Humiliation, Afraid, Rape, and Kick questionnaire    Fear of Current or Ex-Partner: No    Emotionally Abused: No    Physically Abused: No    Sexually Abused: No    FAMILY HISTORY:  Family History  Problem Relation Age of Onset   Stroke Father    Heart attack Mother    Heart attack  Maternal Grandmother    Heart attack Maternal Grandfather    Heart disease Sister    Heart disease Brother    Heart disease Sister    Heart disease Brother    Prostate cancer Brother    Colon cancer Neg Hx    Esophageal cancer Neg Hx    Rectal cancer Neg Hx    Stomach cancer Neg Hx     CURRENT MEDICATIONS:  Outpatient Encounter Medications as of 04/01/2022  Medication Sig   Accu-Chek Softclix Lancets lancets Use as instructed to monitor FSBS 1x daily. Dx: E11.9   Alcohol Swabs (B-D SINGLE USE SWABS REGULAR) PADS Use as instructed to monitor FSBS 1x daily. Dx: E11.9   amLODipine (NORVASC) 5 MG tablet TAKE 1 TABLET(5 MG) BY MOUTH DAILY   amoxicillin (AMOXIL) 500 MG capsule Take 1 capsule (500 mg total) by mouth 3 (three) times daily.   aspirin 81 MG tablet Take 81 mg by mouth daily.   Blood Glucose Monitoring Suppl (ACCU-CHEK GUIDE ME) w/Device KIT Use as instructed to monitor FSBS 1x daily. Dx: E11.9   Cholecalciferol (VITAMIN D) 2000 units CAPS Take 1 capsule by mouth daily.   finasteride (PROSCAR) 5 MG tablet TAKE 1 TABLET(5 MG) BY MOUTH DAILY.   glucose blood (ACCU-CHEK GUIDE) test strip Use as instructed to monitor FSBS 1x daily. Dx: E11.9   lansoprazole (PREVACID) 30 MG capsule Take 1 capsule (30 mg total) by mouth daily.   levocetirizine (XYZAL) 5 MG tablet Take 1 tablet (5 mg total) by mouth every evening.   metFORMIN (GLUCOPHAGE-XR) 500 MG 24 hr tablet TAKE 2 TABLETS(1000 MG) BY MOUTH DAILY WITH BREAKFAST   metoprolol succinate (TOPROL-XL) 25 MG 24 hr tablet Take 0.5 tablets (12.5 mg total) by mouth daily.   Multiple Vitamin (MULTIVITAMIN) tablet Take 1 tablet by mouth daily.   nitroGLYCERIN (NITROSTAT) 0.4 MG SL tablet Place 1 tablet (0.4 mg total) under the tongue every 5 (five) minutes as needed.   olopatadine (PATADAY) 0.1 % ophthalmic solution Place 1 drop into both eyes 2 (two) times daily.   Omega-3 Fatty Acids (FISH OIL) 1000 MG CPDR Take 1,000 mg by mouth every morning.    simvastatin (ZOCOR) 10 MG tablet TAKE 1 TABLET AT BEDTIME   tamsulosin (FLOMAX) 0.4 MG CAPS capsule Take 1 capsule (0.4 mg total) by mouth daily.   No facility-administered encounter medications on file as of 04/01/2022.    ALLERGIES:  Allergies  Allergen Reactions   Imdur [Isosorbide Mononitrate]     Unknown, per pt    Meloxicam Swelling    Facial swelling      PHYSICAL EXAM:  ECOG PERFORMANCE STATUS: {CHL ONC ECOG PS:[id number]}  There were no vitals filed for this visit. There were no vitals filed for this visit. Physical Exam   LABORATORY DATA:  I have reviewed the labs as listed.  CBC    Component Value Date/Time   WBC 7.0 03/31/2022 1016   RBC 4.52 03/31/2022 1016   HGB 13.5 03/31/2022 1016   HGB 14.4 05/31/2020 0848   HCT 40.0 03/31/2022 1016   HCT 41.9 05/31/2020 0848   PLT 169 03/31/2022 1016   PLT CANCELED 05/31/2020 0848   MCV 88.5 03/31/2022 1016   MCV 87 05/31/2020 0848   MCH 29.9 03/31/2022 1016   MCHC 33.8 03/31/2022 1016   RDW 12.3 03/31/2022 1016   RDW 12.4 05/31/2020 0848   LYMPHSABS 1.5 03/31/2022 1016   MONOABS 0.4 03/31/2022 1016   EOSABS 0.1 03/31/2022 1016   BASOSABS 0.1 03/31/2022 1016      Latest Ref Rng & Units 11/04/2021    2:50 PM 10/10/2021   11:14 AM 07/25/2021    8:04 AM  CMP  Glucose 65 - 99 mg/dL 129  153  144   BUN 7 - 25 mg/dL _0 Creatinine 0.70 - 1.22 mg/dL 0.95  0.77  0.75   Sodium 135 - 146 mmol/L 141  142  141   Potassium 3.5 - 5.3 mmol/L 4.1  4.0  4.1   Chloride 98 - 110 mmol/L 104  106  106   CO2 20 - 32 mmol/L _1 Calcium 8.6 - 10.3 mg/dL 9.2  9.0  8.7   Total Protein 6.1 - 8.1 g/dL 6.1  5.9  5.9   Total Bilirubin 0.2 - 1.2 mg/dL 0.5  0.4  0.5   AST 10 - 35 U/L _2 ALT 9 - 46 U/L _3 DIAGNOSTIC IMAGING:  I have independently reviewed the relevant imaging and discussed with the patient.  ASSESSMENT & PLAN: 1.  *** - *** - PLAN: ***  2.  *** - *** - PLAN:  ***  PLAN SUMMARY & DISPOSITION: ***  All questions were answered. The patient knows to call the clinic with any problems, questions or concerns.  Medical decision making: ***  Time spent on visit: I spent {CHL ONC TIME VISIT - AFBXU:[id number]} counseling the patient face to face. The total time spent in the appointment was {CHL ONC TIME VISIT - BTYOM:[id number]} and more than 50% was on counseling.   Harriett Rush, PA-C  ***

## 2022-04-03 ENCOUNTER — Other Ambulatory Visit: Payer: Self-pay | Admitting: Family Medicine

## 2022-04-09 NOTE — Progress Notes (Signed)
[facility] [address], [place] [address]   CLINIC:  Medical Oncology/Hematology  PCP:  Donita Brooks, MD [address] [place] [address] [phone]   REASON FOR VISIT:  Follow-up for thrombocytopenia  PRIOR THERAPY: None  CURRENT THERAPY: Observation  INTERVAL HISTORY:  Andrew [name] 81 y.o. male returns for routine follow-up of thrombocytopenia.  He was last seen by Dr. Ellin Saba on 09/24/2021.  At today's visit, he reports feeling fairly well.  No recent hospitalizations, surgeries, or changes in baseline health status.  He has easy bruising at baseline but denies any severe abnormal bruising or petechial rash.  He has not had any bleeding such as epistaxis, hematemesis, hematochezia, melena, hematuria, or gum bleeding.  He denies any fever, chills, or night sweats.  He reports that he has lost some weight over the past few years (down 10 pounds in 2 years) after cutting sugary foods out of his diet due to his diabetes.  His weight has been stable over the past 6 months.  He has 75% energy and 70% appetite. He endorses that he is maintaining a stable weight.   REVIEW OF SYSTEMS:    Review of Systems  Constitutional:  Negative for appetite change, chills, diaphoresis, fatigue, fever and unexpected weight change.  HENT:   Negative for lump/mass and nosebleeds.   Eyes:  Negative for eye problems.  Respiratory:  Positive for cough (Allergies). Negative for hemoptysis and shortness of breath.   Cardiovascular:  Negative for chest pain, leg swelling and palpitations.  Gastrointestinal:  Negative for abdominal pain, blood in stool, constipation, diarrhea, nausea and vomiting.  Genitourinary:  Negative for hematuria.   Skin: Negative.   Neurological:  Positive for headaches (Allergies). Negative for dizziness and light-headedness.  Hematological:  Bruises/bleeds easily (Easy bruising).  Psychiatric/Behavioral:  Positive for sleep  disturbance.       PAST MEDICAL/SURGICAL HISTORY:  Past Medical History:  Diagnosis Date   Allergy    grass etc.   Barrett's esophagus    BPH (benign prostatic hyperplasia)    Chest pain, atypical    Chronic kidney disease    kidney stones   Coronary artery disease    GERD (gastroesophageal reflux disease)    Barrett's   Hernia    Hyperlipidemia    Hypertension    Myocardial infarction ([facility])    Thrombocytopenia ([facility])    Ulcer 1960   Past Surgical History:  Procedure Laterality Date   CARDIAC CATHETERIZATION  02/14/2007   MITRAL REGURGITATION. LV NORMAL   CHOLECYSTECTOMY     COLONOSCOPY     CORONARY ARTERY BYPASS GRAFT     twice  2 by passes each time   INGUINAL HERNIA REPAIR     bilateral done twice   LITHOTRIPSY     UPPER GASTROINTESTINAL ENDOSCOPY       SOCIAL HISTORY:  Social History   Socioeconomic History   Marital status: Married    Spouse name: Mary   Number of children: 2   Years of education: Not on file   Highest education level: Not on file  Occupational History   Occupation: Retired  Tobacco Use   Smoking status: Never   Smokeless tobacco: Never  Vaping Use   Vaping Use: Never used  Substance and Sexual Activity   Alcohol use: No   Drug use: No   Sexual activity: Not Currently  Other Topics Concern   Not on file  Social History Narrative   Married  in 1961.   6 grandchildren   Social Determinants of Health   Financial Resource Strain: Low Risk  (09/26/2021)   Overall Financial Resource Strain (CARDIA)    Difficulty of Paying Living Expenses: Not hard at all  Food Insecurity: No Food Insecurity (09/26/2021)   Hunger Vital Sign    Worried About Running Out of Food in the Last Year: Never true    Ran Out of Food in the Last Year: Never true  Transportation Needs: No Transportation Needs (09/26/2021)   PRAPARE - Hydrologist (Medical): No    Lack of Transportation (Non-Medical): No  Physical Activity:  Insufficiently Active (09/26/2021)   Exercise Vital Sign    Days of Exercise per Week: 5 days    Minutes of Exercise per Session: 20 min  Stress: No Stress Concern Present (09/26/2021)   [place]    Feeling of Stress : Not at all  Social Connections: [place] (09/26/2021)   Social Connection and Isolation Panel [NHANES]    Frequency of Communication with Friends and Family: Three times a week    Frequency of Social Gatherings with Friends and Family: Three times a week    Attends Religious Services: More than 4 times per year    Active Member of Clubs or Organizations: Yes    Attends Archivist Meetings: More than 4 times per year    Marital Status: Married  Human resources officer Violence: Not At Risk (09/26/2021)   Humiliation, Afraid, Rape, and Kick questionnaire    Fear of Current or Ex-Partner: No    Emotionally Abused: No    Physically Abused: No    Sexually Abused: No    FAMILY HISTORY:  Family History  Problem Relation Age of Onset   Stroke Father    Heart attack Mother    Heart attack Maternal Grandmother    Heart attack Maternal Grandfather    Heart disease Sister    Heart disease Brother    Heart disease Sister    Heart disease Brother    Prostate cancer Brother    Colon cancer Neg Hx    Esophageal cancer Neg Hx    Rectal cancer Neg Hx    Stomach cancer Neg Hx     CURRENT MEDICATIONS:  Outpatient Encounter Medications as of 04/10/2022  Medication Sig   Accu-Chek Softclix Lancets lancets TEST BLOOD SUGAR ONE TIME DAILY   Alcohol Swabs (DROPSAFE ALCOHOL PREP) 70 % PADS USE TOPICALLY ONE TIME DAILY   amLODipine (NORVASC) 5 MG tablet TAKE 1 TABLET(5 MG) BY MOUTH DAILY   amoxicillin (AMOXIL) 500 MG capsule Take 1 capsule (500 mg total) by mouth 3 (three) times daily.   aspirin 81 MG tablet Take 81 mg by mouth daily.   Blood Glucose Monitoring Suppl (ACCU-CHEK GUIDE ME) w/Device KIT  Use as instructed to monitor FSBS 1x daily. Dx: E11.9   Cholecalciferol (VITAMIN D) 2000 units CAPS Take 1 capsule by mouth daily.   finasteride (PROSCAR) 5 MG tablet TAKE 1 TABLET(5 MG) BY MOUTH DAILY.   glucose blood (ACCU-CHEK GUIDE) test strip TEST BLOOD SUGAR ONE TIME DAILY AS DIRECTED   lansoprazole (PREVACID) 30 MG capsule Take 1 capsule (30 mg total) by mouth daily.   levocetirizine (XYZAL) 5 MG tablet Take 1 tablet (5 mg total) by mouth every evening.   metFORMIN (GLUCOPHAGE-XR) 500 MG 24 hr tablet TAKE 2 TABLETS(1000 MG) BY MOUTH DAILY WITH BREAKFAST   metoprolol  succinate (TOPROL-XL) 25 MG 24 hr tablet TAKE 1/2 TABLET EVERY DAY   Multiple Vitamin (MULTIVITAMIN) tablet Take 1 tablet by mouth daily.   nitroGLYCERIN (NITROSTAT) 0.4 MG SL tablet Place 1 tablet (0.4 mg total) under the tongue every 5 (five) minutes as needed.   olopatadine (PATADAY) 0.1 % ophthalmic solution Place 1 drop into both eyes 2 (two) times daily.   Omega-3 Fatty Acids (FISH OIL) 1000 MG CPDR Take 1,000 mg by mouth every morning.   simvastatin (ZOCOR) 10 MG tablet TAKE 1 TABLET AT BEDTIME   tamsulosin (FLOMAX) 0.4 MG CAPS capsule Take 1 capsule (0.4 mg total) by mouth daily.   No facility-administered encounter medications on file as of 04/10/2022.    ALLERGIES:  Allergies  Allergen Reactions   Imdur [Isosorbide Mononitrate]     Unknown, per pt    Meloxicam Swelling    Facial swelling      PHYSICAL EXAM:    ECOG PERFORMANCE STATUS: 1 - Symptomatic but completely ambulatory  There were no vitals filed for this visit. There were no vitals filed for this visit. Physical Exam Constitutional:      Appearance: Normal appearance.  HENT:     Head: Normocephalic and atraumatic.     Mouth/Throat:     Mouth: Mucous membranes are moist.  Eyes:     Extraocular Movements: Extraocular movements intact.     Pupils: Pupils are equal, round, and reactive to light.  Cardiovascular:     Rate and Rhythm: Normal  rate and regular rhythm.     Pulses: Normal pulses.     Heart sounds: Normal heart sounds.  Pulmonary:     Effort: Pulmonary effort is normal.     Breath sounds: Normal breath sounds.  Abdominal:     General: Bowel sounds are normal.     Palpations: Abdomen is soft.     Tenderness: There is no abdominal tenderness.  Musculoskeletal:        General: No swelling.     Right lower leg: No edema.     Left lower leg: No edema.  Lymphadenopathy:     Cervical: No cervical adenopathy.  Skin:    General: Skin is warm and dry.     Findings: Bruising (Dorsum of hands bilaterally) present.  Neurological:     General: No focal deficit present.     Mental Status: He is alert and oriented to person, place, and time.  Psychiatric:        Mood and Affect: Mood normal.        Behavior: Behavior normal.      LABORATORY DATA:  I have reviewed the labs as listed.  CBC    Component Value Date/Time   WBC 7.0 03/31/2022 1016   RBC 4.52 03/31/2022 1016   HGB 13.5 03/31/2022 1016   HGB 14.4 05/31/2020 0848   HCT 40.0 03/31/2022 1016   HCT 41.9 05/31/2020 0848   PLT 169 03/31/2022 1016   PLT CANCELED 05/31/2020 0848   MCV 88.5 03/31/2022 1016   MCV 87 05/31/2020 0848   MCH 29.9 03/31/2022 1016   MCHC 33.8 03/31/2022 1016   RDW 12.3 03/31/2022 1016   RDW 12.4 05/31/2020 0848   LYMPHSABS 1.5 03/31/2022 1016   MONOABS 0.4 03/31/2022 1016   EOSABS 0.1 03/31/2022 1016   BASOSABS 0.1 03/31/2022 1016      Latest Ref Rng & Units 11/04/2021    2:50 PM 10/10/2021   11:14 AM 07/25/2021    8:04 AM  CMP  Glucose 65 - 99 mg/dL 129  153  144   BUN 7 - 25 mg/dL $Remove'22  22  18   'iSXifku$ Creatinine 0.70 - 1.22 mg/dL 0.95  0.77  0.75   Sodium 135 - 146 mmol/L 141  142  141   Potassium 3.5 - 5.3 mmol/L 4.1  4.0  4.1   Chloride 98 - 110 mmol/L 104  106  106   CO2 20 - 32 mmol/L $RemoveB'29  28  27   'FUDbJfQH$ Calcium 8.6 - 10.3 mg/dL 9.2  9.0  8.7   Total Protein 6.1 - 8.1 g/dL 6.1  5.9  5.9   Total Bilirubin 0.2 - 1.2 mg/dL 0.5   0.4  0.5   AST 10 - 35 U/L $Remo'15  14  15   'btCLs$ ALT 9 - 46 U/L $Remo'13  13  11     'QHQIT$ DIAGNOSTIC IMAGING:  I have independently reviewed the relevant imaging and discussed with the patient.  ASSESSMENT & PLAN: 1.  Mild to moderate thrombocytopenia - Followed at our clinic for intermittent mild to moderate thrombocytopenia - He has had intermittently low platelet count since 2011, ranging from 59 to normal -CTAP on 07/12/2019 shows normal spleen with no hepatic masses.  Normal enlarged lymph nodes. - Nutritional deficiency work-up and connective tissue disorder work-up was negative. - He denies any B symptoms or infections in the last 6 months.   - He has easy bruising over the dorsum of the hands due to thinning of the skin.  No bleeding manifestations.   - Most recent labs (03/31/2022) with normal platelets 169 and mildly elevated LDH 262.  He had platelets 91 on 11/04/2021. - Differential diagnosis includes platelet clumping versus immune mediated thrombocytopenia. - PLAN: Recommend follow-up in 6 months with repeat CBC.   All questions were answered. The patient knows to call the clinic with any problems, questions or concerns.  Medical decision making: Low  Time spent on visit: I spent 15 minutes counseling the patient face to face. The total time spent in the appointment was 22 minutes and more than 50% was on counseling.   Harriett Rush, PA-C  04/10/2022 10:33 AM

## 2022-04-10 ENCOUNTER — Inpatient Hospital Stay (HOSPITAL_COMMUNITY): Payer: Medicare HMO | Admitting: Physician Assistant

## 2022-04-10 VITALS — BP 106/60 | HR 68 | Temp 97.9°F | Resp 18 | Ht 69.0 in | Wt 148.8 lb

## 2022-04-10 DIAGNOSIS — R7402 Elevation of levels of lactic acid dehydrogenase (LDH): Secondary | ICD-10-CM | POA: Diagnosis not present

## 2022-04-10 DIAGNOSIS — D696 Thrombocytopenia, unspecified: Secondary | ICD-10-CM

## 2022-04-10 DIAGNOSIS — R599 Enlarged lymph nodes, unspecified: Secondary | ICD-10-CM | POA: Diagnosis not present

## 2022-04-10 NOTE — Patient Instructions (Signed)
[place] at [facility] Discharge Instructions  You were seen today by Tarri Abernethy PA-C for your low platelets.  Your most recent platelet levels were normal.  We will check your labs and see you for follow-up visit again in 6 months.  **Seek IMMEDIATE medical attention if you have any signs of severely low platelets including abnormal bleeding, severe abnormal bruising, severe nosebleeds, bright red blood in the toilet, or black tarry bowel movements.    Thank you for choosing [place] at [facility] to provide your oncology and hematology care.  To afford each patient quality time with our provider, please arrive at least 15 minutes before your scheduled appointment time.   If you have a lab appointment with the [facility] please come in thru the Main Entrance and check in at the main information desk.  You need to re-schedule your appointment should you arrive 10 or more minutes late.  We strive to give you quality time with our providers, and arriving late affects you and other patients whose appointments are after yours.  Also, if you no show three or more times for appointments you may be dismissed from the clinic at the providers discretion.     Again, thank you for choosing [facility].  Our hope is that these requests will decrease the amount of time that you wait before being seen by our physicians.       _____________________________________________________________  Should you have questions after your visit to [facility], please contact our office at [phone] and follow the prompts.  Our office hours are 8:00 a.m. and 4:30 p.m. Monday - Friday.  Please note that voicemails left after 4:00 p.m. may not be returned until the following business day.  We are closed weekends and major holidays.  You do have access to a nurse 24-7, just call the main number to the clinic [phone] and do not press  any options, hold on the line and a nurse will answer the phone.    For prescription refill requests, have your pharmacy contact our office and allow 72 hours.    Due to Covid, you will need to wear a mask upon entering the hospital. If you do not have a mask, a mask will be given to you at the Main Entrance upon arrival. For doctor visits, patients may have 1 support person age 73 or older with them. For treatment visits, patients can not have anyone with them due to social distancing guidelines and our immunocompromised population.

## 2022-04-20 ENCOUNTER — Ambulatory Visit (INDEPENDENT_AMBULATORY_CARE_PROVIDER_SITE_OTHER): Payer: Medicare HMO | Admitting: Family Medicine

## 2022-04-20 VITALS — BP 118/60 | HR 72 | Temp 97.7°F | Ht 69.0 in | Wt 149.0 lb

## 2022-04-20 DIAGNOSIS — M898X9 Other specified disorders of bone, unspecified site: Secondary | ICD-10-CM

## 2022-04-20 DIAGNOSIS — M542 Cervicalgia: Secondary | ICD-10-CM | POA: Diagnosis not present

## 2022-04-20 DIAGNOSIS — M549 Dorsalgia, unspecified: Secondary | ICD-10-CM | POA: Diagnosis not present

## 2022-04-20 MED ORDER — NITROGLYCERIN 0.4 MG SL SUBL
0.4000 mg | SUBLINGUAL_TABLET | SUBLINGUAL | 3 refills | Status: DC | PRN
Start: 2022-04-20 — End: 2022-06-02

## 2022-04-20 NOTE — Progress Notes (Signed)
Subjective:    Patient ID: Andrew [name], male    DOB: May 09, 1941, 81 y.o.   MRN: [id number]  Wt Readings from Last 3 Encounters:  04/20/22 149 lb (67.6 kg)  04/10/22 148 lb 13 oz (67.5 kg)  11/04/21 152 lb (68.9 kg)  Patient presents today with vague mid back pain.  This is been going on for several weeks.  He denies any injury.  The pain is located in between his scapula.  It radiates like a band from side to side in a horizontal line.  There is no exacerbating or alleviating factor.  The pain is constant.  He request an EKG because he is concerned that this could be his heart.  I assured the patient I did not feel that this was ischemic coronary artery disease however the patient would like an EKG which I did perform for him today which shows normal sinus rhythm with no evidence of ischemia or infarction.  The pain does not radiate up and down the back.  It stays centered in the mid thoracic area.  Patient's blood pressure today is stable.  He is in no apparent distress.  I do not feel that this is due to any type of aortic dissection given the chronicity of the pain and how comfortably appears.  He also complains of pain in his neck.  It hurts for him to turn his neck side to side.  He has mild kyphosis of the cervical spine.  He is unable to fully extend his neck.  The pain is located bilaterally in the trapezius muscles and seems to be muscular in nature.  He denies any weakness in his arms or legs.  He denies any fevers or chills.  He denies any abdominal pain nausea vomiting or melena.  There is no tenderness over the pancreas or stomach today on examination.  There is no pain or tenderness in the right upper quadrant.  Past Medical History:  Diagnosis Date   Allergy    grass etc.   Barrett's esophagus    BPH (benign prostatic hyperplasia)    Chest pain, atypical    Chronic kidney disease    kidney stones   Coronary artery disease    GERD (gastroesophageal reflux disease)    Barrett's    Hernia    Hyperlipidemia    Hypertension    Myocardial infarction ([place])    Thrombocytopenia ([place])    Ulcer 1960   Past Surgical History:  Procedure Laterality Date   CARDIAC CATHETERIZATION  02/14/2007   MITRAL REGURGITATION. LV NORMAL   CHOLECYSTECTOMY     COLONOSCOPY     CORONARY ARTERY BYPASS GRAFT     twice  2 by passes each time   INGUINAL HERNIA REPAIR     bilateral done twice   LITHOTRIPSY     UPPER GASTROINTESTINAL ENDOSCOPY     Current Outpatient Medications on File Prior to Visit  Medication Sig Dispense Refill   Accu-Chek Softclix Lancets lancets TEST BLOOD SUGAR ONE TIME DAILY 100 each 1   Alcohol Swabs (DROPSAFE ALCOHOL PREP) 70 % PADS USE TOPICALLY ONE TIME DAILY 100 each 1   amLODipine (NORVASC) 5 MG tablet TAKE 1 TABLET(5 MG) BY MOUTH DAILY 90 tablet 3   aspirin 81 MG tablet Take 81 mg by mouth daily.     Blood Glucose Monitoring Suppl (ACCU-CHEK GUIDE ME) w/Device KIT Use as instructed to monitor FSBS 1x daily. Dx: E11.9 1 kit 1   Cholecalciferol (VITAMIN D) 2000  units CAPS Take 1 capsule by mouth daily.     finasteride (PROSCAR) 5 MG tablet TAKE 1 TABLET(5 MG) BY MOUTH DAILY. 90 tablet 3   glucose blood (ACCU-CHEK GUIDE) test strip TEST BLOOD SUGAR ONE TIME DAILY AS DIRECTED 100 strip 0   lansoprazole (PREVACID) 30 MG capsule Take 1 capsule (30 mg total) by mouth daily. 90 capsule 3   levocetirizine (XYZAL) 5 MG tablet Take 1 tablet (5 mg total) by mouth every evening. 90 tablet 1   metFORMIN (GLUCOPHAGE-XR) 500 MG 24 hr tablet TAKE 2 TABLETS(1000 MG) BY MOUTH DAILY WITH BREAKFAST 180 tablet 3   metoprolol succinate (TOPROL-XL) 25 MG 24 hr tablet TAKE 1/2 TABLET EVERY DAY 45 tablet 2   Multiple Vitamin (MULTIVITAMIN) tablet Take 1 tablet by mouth daily.     nitroGLYCERIN (NITROSTAT) 0.4 MG SL tablet Place 1 tablet (0.4 mg total) under the tongue every 5 (five) minutes as needed. 45 tablet 3   olopatadine (PATADAY) 0.1 % ophthalmic solution Place 1 drop into both  eyes 2 (two) times daily. 5 mL 12   Omega-3 Fatty Acids (FISH OIL) 1000 MG CPDR Take 1,000 mg by mouth every morning.     simvastatin (ZOCOR) 10 MG tablet TAKE 1 TABLET AT BEDTIME 90 tablet 3   tamsulosin (FLOMAX) 0.4 MG CAPS capsule Take 1 capsule (0.4 mg total) by mouth daily. 90 capsule 1   No current facility-administered medications on file prior to visit.   Allergies  Allergen Reactions   Imdur [Isosorbide Mononitrate]     Unknown, per pt    Meloxicam Swelling    Facial swelling    Social History   Socioeconomic History   Marital status: Married    Spouse name: Karnes   Number of children: 2   Years of education: Not on file   Highest education level: Not on file  Occupational History   Occupation: Retired  Tobacco Use   Smoking status: Never   Smokeless tobacco: Never  Vaping Use   Vaping Use: Never used  Substance and Sexual Activity   Alcohol use: No   Drug use: No   Sexual activity: Not Currently  Other Topics Concern   Not on file  Social History Narrative   Married in 1961.   6 grandchildren   Social Determinants of Health   Financial Resource Strain: Low Risk  (09/26/2021)   Overall Financial Resource Strain (CARDIA)    Difficulty of Paying Living Expenses: Not hard at all  Food Insecurity: No Food Insecurity (09/26/2021)   Hunger Vital Sign    Worried About Running Out of Food in the Last Year: Never true    [place] in the Last Year: Never true  Transportation Needs: No Transportation Needs (09/26/2021)   PRAPARE - Hydrologist (Medical): No    Lack of Transportation (Non-Medical): No  Physical Activity: Insufficiently Active (09/26/2021)   Exercise Vital Sign    Days of Exercise per Week: 5 days    Minutes of Exercise per Session: 20 min  Stress: No Stress Concern Present (09/26/2021)   [place]    Feeling of Stress : Not at all  Social  Connections: [place] (09/26/2021)   Social Connection and Isolation Panel [NHANES]    Frequency of Communication with Friends and Family: Three times a week    Frequency of Social Gatherings with Friends and Family: Three times a week  Attends Religious Services: More than 4 times per year    Active Member of Clubs or Organizations: Yes    Attends Archivist Meetings: More than 4 times per year    Marital Status: Married  Human resources officer Violence: Not At Risk (09/26/2021)   Humiliation, Afraid, Rape, and Kick questionnaire    Fear of Current or Ex-Partner: No    Emotionally Abused: No    Physically Abused: No    Sexually Abused: No      Review of Systems  All other systems reviewed and are negative.      Objective:   Physical Exam Vitals reviewed.  Constitutional:      Appearance: Normal appearance.  Cardiovascular:     Rate and Rhythm: Normal rate and regular rhythm.     Heart sounds: Normal heart sounds. No murmur heard.    No friction rub. No gallop.  Pulmonary:     Effort: Pulmonary effort is normal. No respiratory distress.     Breath sounds: Normal breath sounds and air entry. No stridor, decreased air movement or transmitted upper airway sounds. No wheezing, rhonchi or rales.    Musculoskeletal:     Cervical back: Tenderness present. No spasms. Decreased range of motion.     Thoracic back: No spasms, tenderness or bony tenderness. Normal range of motion.     Right lower leg: No edema.     Left lower leg: No edema.  Neurological:     Mental Status: He is alert.           Assessment & Plan:  Mid back pain - Plan: DG Thoracic Spine W/Swimmers, EKG 12-Lead  Neck pain - Plan: DG Cervical Spine Complete  Bone pain - Plan: CBC with Differential/Platelet, COMPLETE METABOLIC PANEL WITH GFR, Sedimentation rate, Protein electrophoresis, serum Performed an EKG today at the patient request to reassure him that I do not feel that this is due  to any ischemia.  I do not feel that this is an aortic dissection based on his presentation and the mildness of the pain.  However I am concerned that this is some type of vague bone pain in the center.  This could certainly be due to a vertebral fracture so I will obtain a thoracic spine x-ray.  Given the pain in his neck I will also obtain a cervical spine x-ray.  I will check a CMP to monitor his alkaline phosphatase to evaluate for any increase bone turnover.  I will check an SPEP to look for any evidence of multiple myeloma.  Check a CBC to evaluate for any evidence of leukocytosis check a sedimentation rate to look for any evidence of inflammation that could suggest underlying malignancy.

## 2022-04-21 ENCOUNTER — Ambulatory Visit
Admission: RE | Admit: 2022-04-21 | Discharge: 2022-04-21 | Disposition: A | Discharge status: Home / Self Care | Payer: Medicare HMO | Source: Ambulatory Visit | Attending: Family Medicine | Admitting: Family Medicine

## 2022-04-21 DIAGNOSIS — M549 Dorsalgia, unspecified: Secondary | ICD-10-CM | POA: Diagnosis not present

## 2022-04-21 DIAGNOSIS — M542 Cervicalgia: Secondary | ICD-10-CM

## 2022-04-21 DIAGNOSIS — M47812 Spondylosis without myelopathy or radiculopathy, cervical region: Secondary | ICD-10-CM | POA: Diagnosis not present

## 2022-04-21 DIAGNOSIS — I6529 Occlusion and stenosis of unspecified carotid artery: Secondary | ICD-10-CM | POA: Diagnosis not present

## 2022-04-21 DIAGNOSIS — Z951 Presence of aortocoronary bypass graft: Secondary | ICD-10-CM | POA: Diagnosis not present

## 2022-04-21 DIAGNOSIS — M2578 Osteophyte, vertebrae: Secondary | ICD-10-CM | POA: Diagnosis not present

## 2022-04-22 LAB — COMPLETE METABOLIC PANEL WITH GFR
AG Ratio: 2.5 (calc) (ref 1.0–2.5)
ALT: 10 U/L (ref 9–46)
AST: 12 U/L (ref 10–35)
Albumin: 4.2 g/dL (ref 3.6–5.1)
Alkaline phosphatase (APISO): 47 U/L (ref 35–144)
BUN: 23 mg/dL (ref 7–25)
CO2: 29 mmol/L (ref 20–32)
Calcium: 9.3 mg/dL (ref 8.6–10.3)
Chloride: 105 mmol/L (ref 98–110)
Creat: 0.89 mg/dL (ref 0.70–1.22)
Globulin: 1.7 g/dL (calc) — ABNORMAL LOW (ref 1.9–3.7)
Glucose, Bld: 141 mg/dL — ABNORMAL HIGH (ref 65–99)
Potassium: 4.3 mmol/L (ref 3.5–5.3)
Sodium: 141 mmol/L (ref 135–146)
Total Bilirubin: 0.4 mg/dL (ref 0.2–1.2)
Total Protein: 5.9 g/dL — ABNORMAL LOW (ref 6.1–8.1)
eGFR: 85 mL/min/{1.73_m2} (ref >=60)

## 2022-04-22 LAB — CBC WITH DIFFERENTIAL/PLATELET
Absolute Monocytes: 524 cells/uL (ref 200–950)
Basophils Absolute: 31 cells/uL (ref 0–200)
Basophils Relative: 0.4 %
Eosinophils Absolute: 77 cells/uL (ref 15–500)
Eosinophils Relative: 1 %
HCT: 39.8 % (ref 38.5–50.0)
Hemoglobin: 13.5 g/dL (ref 13.2–17.1)
Lymphs Abs: 1355 cells/uL (ref 850–3900)
MCH: 30.1 pg (ref 27.0–33.0)
MCHC: 33.9 g/dL (ref 32.0–36.0)
MCV: 88.8 fL (ref 80.0–100.0)
MPV: 11.1 fL (ref 7.5–12.5)
Monocytes Relative: 6.8 %
Neutro Abs: 5713 cells/uL (ref 1500–7800)
Neutrophils Relative %: 74.2 %
Platelets: 51 10*3/uL — ABNORMAL LOW (ref 140–400)
RBC: 4.48 10*6/uL (ref 4.20–5.80)
RDW: 11.8 % (ref 11.0–15.0)
Total Lymphocyte: 17.6 %
WBC: 7.7 10*3/uL (ref 3.8–10.8)

## 2022-04-22 LAB — PROTEIN ELECTROPHORESIS, SERUM
Albumin ELP: 3.9 g/dL (ref 3.8–4.8)
Alpha 1: 0.3 g/dL (ref 0.2–0.3)
Alpha 2: 0.6 g/dL (ref 0.5–0.9)
Beta 2: 0.3 g/dL (ref 0.2–0.5)
Beta Globulin: 0.4 g/dL (ref 0.4–0.6)
Gamma Globulin: 0.6 g/dL — ABNORMAL LOW (ref 0.8–1.7)
Total Protein: 6 g/dL — ABNORMAL LOW (ref 6.1–8.1)

## 2022-04-22 LAB — SEDIMENTATION RATE: Sed Rate: 2 mm/h (ref 0–20)

## 2022-04-24 NOTE — Progress Notes (Deleted)
Chronic Care Management Pharmacy Note  04/24/2022 Name:  Andrew [name] MRN:  [id number] DOB:  [birth year]  Subjective: Andrew [name] is an 81 y.o. year old male who is a primary patient of Pickard, Cammie Mcgee, MD.  The CCM team was consulted for assistance with disease management and care coordination needs.    Engaged with patient by telephone for follow up visit in response to provider referral for pharmacy case management and/or care coordination services.   Consent to Services:  The patient was given the following information about Chronic Care Management services today, agreed to services, and gave verbal consent: 1. CCM service includes personalized support from designated clinical staff supervised by the primary care provider, including individualized plan of care and coordination with other care providers 2. 24/7 contact phone numbers for assistance for urgent and routine care needs. 3. Service will only be billed when office clinical staff spend 20 minutes or more in a month to coordinate care. 4. Only one practitioner may furnish and bill the service in a calendar month. 5.The patient may stop CCM services at any time (effective at the end of the month) by phone call to the office staff. 6. The patient will be responsible for cost sharing (co-pay) of up to 20% of the service fee (after annual deductible is met). Patient agreed to services and consent obtained.  Patient Care Team: Susy Frizzle, MD as PCP - General (Family Medicine) Nahser, Wonda Cheng, MD as PCP - Cardiology (Cardiology) Edythe Clarity, [facility] as Pharmacist (Pharmacist) Edythe Clarity, [facility] (Pharmacist)  Recent office visits: 06/06/21 Dennard Schaumann) - started on Metformin XR 1081m every morning, recheck A1c in October 01/10/21 (Pickard) - L sided lower back pain.  BP was elevated and amlodipine 550mwas added to medication.  12/12/20 (Pickard) - BP was elevated patient is to record readings and report back in one  week.   Recent consult visits: None since last CCM contact  Hospital visits: None in previous 6 months  Objective:  Lab Results  Component Value Date   CREATININE 0.89 04/20/2022   BUN 23 04/20/2022   GFR 91.76 07/08/2015   GFRNONAA 82 04/09/2021   GFRAA 95 04/09/2021   NA 141 04/20/2022   K 4.3 04/20/2022   CALCIUM 9.3 04/20/2022   CO2 29 04/20/2022   GLUCOSE 141 (H) 04/20/2022    Lab Results  Component Value Date/Time   HGBA1C 6.4 (H) 01/15/2022 08:22 AM   HGBA1C 6.2 (H) 10/10/2021 11:14 AM   GFR 91.76 07/08/2015 07:39 AM   GFR 86.83 11/11/2011 08:49 AM   MICROALBUR 1.0 10/10/2021 11:14 AM   MICROALBUR 0.9 08/27/2020 08:59 AM    Last diabetic Eye exam: No results found for: "HMDIABEYEEXA"  Last diabetic Foot exam: No results found for: "HMDIABFOOTEX"   Lab Results  Component Value Date   CHOL 102 07/25/2021   HDL 41 07/25/2021   LDLCALC 48 07/25/2021   TRIG 45 07/25/2021   CHOLHDL 2.5 07/25/2021       Latest Ref Rng & Units 04/20/2022    4:41 PM 11/04/2021    2:50 PM 10/10/2021   11:14 AM  Hepatic Function  Total Protein 6.1 - 8.1 g/dL 6.1 - 8.1 g/dL 5.9    6.0  6.1  5.9   AST 10 - 35 U/L _0 ALT 9 - 46 U/L _1 Total Bilirubin 0.2 - 1.2 mg/dL 0.4  0.5  0.4  Lab Results  Component Value Date/Time   TSH 1.37 10/10/2021 11:14 AM   TSH 2.14 07/15/2018 08:48 AM       Latest Ref Rng & Units 04/20/2022    4:41 PM 03/31/2022   10:16 AM 11/04/2021    2:50 PM  CBC  WBC 3.8 - 10.8 Thousand/uL 7.7  7.0  8.6   Hemoglobin 13.2 - 17.1 g/dL 13.5  13.5  13.4   Hematocrit 38.5 - 50.0 % 39.8  40.0  39.9   Platelets 140 - 400 Thousand/uL 51  169  91     Lab Results  Component Value Date/Time   VD25OH 52.05 02/29/2020 03:54 PM    Clinical ASCVD: No  The ASCVD Risk score (Arnett DK, et al., 2019) failed to calculate for the following reasons:   The 2019 ASCVD risk score is only valid for ages 27 to 41   The patient has a prior MI or  stroke diagnosis       10/10/2021   12:20 PM 09/26/2021   10:38 AM 08/27/2020    8:28 AM  Depression screen PHQ 2/9  Decreased Interest 0 0 0  Down, Depressed, Hopeless 0 0 0  PHQ - 2 Score 0 0 0  Altered sleeping 1    Tired, decreased energy 3    Change in appetite 0    Feeling bad or failure about yourself  0    Trouble concentrating 0    Moving slowly or fidgety/restless 0    Suicidal thoughts 0    PHQ-9 Score 4       Social History   Tobacco Use  Smoking Status Never  Smokeless Tobacco Never   BP Readings from Last 3 Encounters:  04/20/22 118/60  04/10/22 106/60  02/07/22 109/63   Pulse Readings from Last 3 Encounters:  04/20/22 72  04/10/22 68  02/07/22 70   Wt Readings from Last 3 Encounters:  04/20/22 149 lb (67.6 kg)  04/10/22 148 lb 13 oz (67.5 kg)  11/04/21 152 lb (68.9 kg)   BMI Readings from Last 3 Encounters:  04/20/22 22.00 kg/m  04/10/22 21.98 kg/m  11/04/21 22.45 kg/m    Assessment/Interventions: Review of patient past medical history, allergies, medications, health status, including review of consultants reports, laboratory and other test data, was performed as part of comprehensive evaluation and provision of chronic care management services.   SDOH:  (Social Determinants of Health) assessments and interventions performed: Yes   Financial Resource Strain: Low Risk  (09/26/2021)   Overall Financial Resource Strain (CARDIA)    Difficulty of Paying Living Expenses: Not hard at all    SDOH Screenings   Alcohol Screen: Low Risk  (09/26/2021)   Alcohol Screen    Last Alcohol Screening Score (AUDIT): 0  Depression (PHQ2-9): Low Risk  (10/10/2021)   Depression (PHQ2-9)    PHQ-2 Score: 4  Financial Resource Strain: Low Risk  (09/26/2021)   Overall Financial Resource Strain (CARDIA)    Difficulty of Paying Living Expenses: Not hard at all  Food Insecurity: No Food Insecurity (09/26/2021)   Hunger Vital Sign    Worried About Running Out  of Food in the Last Year: Never true    Ran Out of Food in the Last Year: Never true  Housing: Low Risk  (09/26/2021)   Housing    Last Housing Risk Score: 0  Physical Activity: Insufficiently Active (09/26/2021)   Exercise Vital Sign    Days of Exercise per Week: 5 days  Minutes of Exercise per Session: 20 min  Social Connections: Socially Integrated (09/26/2021)   Social Connection and Isolation Panel [NHANES]    Frequency of Communication with Friends and Family: Three times a week    Frequency of Social Gatherings with Friends and Family: Three times a week    Attends Religious Services: More than 4 times per year    Active Member of Clubs or Organizations: Yes    Attends Archivist Meetings: More than 4 times per year    Marital Status: Married  Stress: No Stress Concern Present (09/26/2021)   [place]    Feeling of Stress : Not at all  Tobacco Use: Low Risk  (02/07/2022)   Patient History    Smoking Tobacco Use: Never    Smokeless Tobacco Use: Never    Passive Exposure: Not on file  Transportation Needs: No Transportation Needs (09/26/2021)   PRAPARE - Transportation    Lack of Transportation (Medical): No    Lack of Transportation (Non-Medical): No    CCM Care Plan  Allergies  Allergen Reactions   Imdur [Isosorbide Mononitrate]     Unknown, per pt    Meloxicam Swelling    Facial swelling     Medications Reviewed Today     Reviewed by Colman Cater, CMA (Certified Medical Assistant) on 04/20/22 at 1604  Med List Status: <None>   Medication Order Taking? Sig Documenting Provider Last Dose Status Informant  Accu-Chek Softclix Lancets lancets [id number] Yes TEST BLOOD SUGAR ONE TIME DAILY Susy Frizzle, MD Taking Active   Alcohol Swabs (DROPSAFE ALCOHOL PREP) 70 % PADS [id number] Yes USE TOPICALLY ONE TIME DAILY Susy Frizzle, MD Taking Active   amLODipine (NORVASC) 5 MG tablet  [id number] Yes TAKE 1 TABLET(5 MG) BY MOUTH DAILY Susy Frizzle, MD Taking Active   aspirin 81 MG tablet [id number] Yes Take 81 mg by mouth daily. [provider] Taking Active Spouse/Significant Other  Blood Glucose Monitoring Suppl (ACCU-CHEK GUIDE ME) w/Device KIT [id number] Yes Use as instructed to monitor FSBS 1x daily. Dx: E11.9 Susy Frizzle, MD Taking Active   Cholecalciferol (VITAMIN D) 2000 units CAPS [id number] Yes Take 1 capsule by mouth daily. [provider] Taking Active Spouse/Significant Other  finasteride (PROSCAR) 5 MG tablet [id number] Yes TAKE 1 TABLET(5 MG) BY MOUTH DAILY. Susy Frizzle, MD Taking Active   glucose blood (ACCU-CHEK GUIDE) test strip [id number] Yes TEST BLOOD SUGAR ONE TIME DAILY AS DIRECTED Susy Frizzle, MD Taking Active   lansoprazole (PREVACID) 30 MG capsule [id number] Yes Take 1 capsule (30 mg total) by mouth daily. Susy Frizzle, MD Taking Active   levocetirizine (XYZAL) 5 MG tablet [id number] No Take 1 tablet (5 mg total) by mouth every evening.  Patient not taking: Reported on 04/20/2022   Susy Frizzle, MD Not Taking Active   metFORMIN (GLUCOPHAGE-XR) 500 MG 24 hr tablet [id number] Yes TAKE 2 TABLETS(1000 MG) BY MOUTH DAILY WITH BREAKFAST Susy Frizzle, MD Taking Active   metoprolol succinate (TOPROL-XL) 25 MG 24 hr tablet [id number] Yes TAKE 1/2 TABLET EVERY DAY Susy Frizzle, MD Taking Active   Multiple Vitamin (MULTIVITAMIN) tablet [id number] Yes Take 1 tablet by mouth daily. [provider] Taking Active Spouse/Significant Other  nitroGLYCERIN (NITROSTAT) 0.4 MG SL tablet [id number] Yes Place 1 tablet (0.4 mg total) under the tongue every 5 (five) minutes as needed. Susy Frizzle, MD Taking Active   olopatadine (PATADAY) 0.1 %  ophthalmic solution [id number] No Place 1 drop into both eyes 2 (two) times daily.  Patient not taking: Reported on 04/20/2022   Susy Frizzle, MD Not Taking Active   Omega-3  Fatty Acids (FISH OIL) 1000 MG CPDR [id number] Yes Take 1,000 mg by mouth every morning. [provider] Taking Active Spouse/Significant Other  simvastatin (ZOCOR) 10 MG tablet [id number] Yes TAKE 1 TABLET AT BEDTIME Susy Frizzle, MD Taking Active   tamsulosin [facility]) 0.4 MG CAPS capsule [id number] Yes Take 1 capsule (0.4 mg total) by mouth daily. Susy Frizzle, MD Taking Active             Patient Active Problem List   Diagnosis Date Noted   Thrombocytopenia ([place])    Bilateral carotid artery disease ([place]) 08/04/2016   Poor posture 02/22/2014   Stiffness of joints, not elsewhere classified, multiple sites 02/22/2014   Prediabetes 01/05/2013   Special screening for malignant neoplasms, colon 04/27/2011   Benign neoplasm of colon 04/27/2011   GERD (gastroesophageal reflux disease) 04/27/2011   Diverticulosis of colon (without mention of hemorrhage) 04/27/2011   CAD (coronary artery disease) 12/27/2010   Environmental allergies 12/27/2010   Insomnia 12/27/2010   H. pylori infection 12/27/2010   Barrett's esophagus 12/27/2010   Hyperlipidemia 11/08/2008   HTN (hypertension) 11/08/2008   GERD 11/08/2008   ACUT PEPTC ULCR UNS SITE W/HEM W/O MENTION OBST 11/08/2008   RENAL CALCULUS 11/08/2008   Obstructive sleep apnea 04/12/2003    Immunization History  Administered Date(s) Administered   Fluad Quad(high Dose 65+) 06/30/2019, 06/18/2020, 08/07/2021   Influenza Split 07/07/2012   Influenza Whole 07/18/2010   Influenza, High Dose Seasonal PF 08/16/2017, 09/07/2018   Influenza,inj,Quad PF,6+ Mos 07/24/2013, 09/10/2014, 08/30/2015, 08/21/2016   Moderna Sars-Covid-2 Vaccination 12/20/2019, 01/17/2020, 10/18/2020   Pneumococcal Conjugate-13 09/10/2014   Pneumococcal Polysaccharide-23 08/16/2007   Td 07/30/2003   Zoster, Live 06/11/2006    Conditions to be addressed/monitored:  HTN, CAD, Barrett's Esophagus, HLD, Pre-DM.    There are no care plans that you  recently modified to display for this patient.       Medication Assistance: None required.  Patient affirms current coverage meets needs.  Patient's preferred pharmacy is:  [facility] [phone] - [place], [place] AT [place] [address] [place] [address] Phone: [phone] Fax: [phone]  [facility] - [place], [place] [address] Phone: [phone] Fax: [phone]   Uses pill box? Yes Pt endorses 100% compliance  We discussed: Benefits of medication synchronization, packaging and delivery as well as enhanced pharmacist oversight with [employer]. Patient decided to: Continue current medication management strategy  Care Plan and Follow Up Patient Decision:  Patient agrees to Care Plan and Follow-up.  Plan: The care management team will reach out to the patient again over the next 90 days.  Beverly Milch, PharmD Clinical Pharmacist [facility] [phone]  Current Barriers:  Unable to independently monitor therapeutic efficacy Unable to achieve control of BP.   Pharmacist Clinical Goal(s):  Patient will achieve adherence to monitoring guidelines and medication adherence to achieve therapeutic efficacy achieve control of BP as evidenced by home monitoring adhere to prescribed medication regimen as evidenced by fill date/pill box contact provider office for questions/concerns as evidenced notation of same in electronic health record through collaboration with PharmD and provider.   Interventions: 1:1 collaboration with Susy Frizzle, MD regarding  development and update of comprehensive plan of care as evidenced by provider attestation and co-signature Inter-disciplinary care team collaboration (see longitudinal plan of care) Comprehensive medication review performed; medication list updated in electronic medical  record  Hypertension (BP goal <140/90) -Controlled -Current treatment: Amlodipine 34m daily Metoprolol XL 245mdaily -Medications previously tried: none noted  -Current home readings: none, patient not checking at home -Current dietary habits: he eats a diabetic conscious diet, watches carbs and sweets -Current exercise habits: active outside with yardwork/gardening -Reports hypotensive/hypertensive symptoms - patient reports feeling very tired often -Educated on BP goals and benefits of medications for prevention of heart attack, stroke and kidney damage; Daily salt intake goal < 2300 mg; Importance of home blood pressure monitoring;  -He denies any swelling -Counseled to monitor BP at home 2 to 3 times per week, document, and provide log at future appointments -Recommended to continue current medication Recommended he monitor BP a few times per week, follow up plan initiated to follow up on readings.  Update 06/19/21 Not checking BP at home. Denies dizziness or Has Continue current meds for now  Hyperlipidemia: (LDL goal < 100) -Controlled -Current treatment: Simvastatin 1026mAppropriate, Effective, Safe, Accessible -Medications previously tried: none noted  -Current dietary patterns: see above -Current exercise habits: see above -Educated on Cholesterol goals;  Benefits of statin for ASCVD risk reduction; Importance of limiting foods high in cholesterol; -Recommended to continue current medication  Update 10/23/21 Most recent LDL is excellent Continues statin with no concerns. 100% adherence Continue current meds, continue routine screenings  Diabetes (A1c goal <7%) -Controlled -Current medications: Metformin XR 500m54mo tablets daily with breakfast - Appropriate, Effective, Safe, Accessible  -Medications previously tried: none noted -Current home glucose readings fasting glucose: N/A not checking currently post prandial glucose: N/A not checking  currently -Denies hypoglycemic/hyperglycemic symptoms -Current meal patterns: "careful with what he eats", watched carbohydrates and sweets.  Does report he eats a lot of peanut butter.  Currently uses JIF low fat which has 15g of carbs per serving.  Recommended he switch to JIF natural which has about 8g of carbs per serving.  Patient agreeable to plan. -Current exercise: outside working around house -Educated on A1c and blood sugar goals; Complications of diabetes including kidney damage, retinal damage, and cardiovascular disease; Prevention and management of hypoglycemic episodes; Benefits of routine self-monitoring of blood sugar; Continuous glucose monitoring; Carbohydrate content of his PB -Counseled to check feet daily and get yearly eye exams -Recommended to continue current medication Recommended dietary changes to cut back on carbs, follow up A1c in early June.  Update 06/19/21 Patient recently started on Metformin XR 1000mg24mly due to elvated A1c in June.  He started with one tablet daily and titrated up to the full dose.  He denies any GI side effects.  He is not checking his glucose at home and has labs scheduled for October for f/u A1c.  We discussed ways to cut back on carbohydrates to get A1c down.  Continue meds for now - recheck A1c October. Consider meter if A1c still elevated.  Update 10/23/21 Counseled again on dietary options to keep glucose controlled. He is not checking his sugar at home which at this time I feel is appropriate due to controlled A1c. He does mention some recent weight loss which is possible with metformin. Denies any symptoms of low blood sugar. Interested in decreasing medication - will recheck his A1c 6 months after this most recent one and if continues to decrease,  could consider decreasing back to 571m daily. No changes at this time, continue routine screenings. A1c controlled at 6.2   Patient Goals/Self-Care Activities Patient will:  -  take medications as prescribed focus on medication adherence by pill count check blood pressure a few times per weekl, document, and provide at future appointments engage in dietary modifications by limiting consumption of carbohydrates  Follow Up Plan: The care management team will reach out to the patient again over the next 90 days.

## 2022-04-28 ENCOUNTER — Other Ambulatory Visit: Payer: Self-pay

## 2022-04-28 MED ORDER — TIZANIDINE HCL 2 MG PO TABS: 2.0000 mg | ORAL_TABLET | Freq: Four times a day (QID) | ORAL | 1 refills | Status: DC | PRN

## 2022-05-07 ENCOUNTER — Telehealth: Payer: Self-pay

## 2022-05-08 ENCOUNTER — Ambulatory Visit (INDEPENDENT_AMBULATORY_CARE_PROVIDER_SITE_OTHER): Payer: Medicare HMO | Admitting: Family Medicine

## 2022-05-08 VITALS — BP 100/58 | HR 73 | Temp 98.0°F | Ht 69.0 in | Wt 147.0 lb

## 2022-05-08 DIAGNOSIS — R634 Abnormal weight loss: Secondary | ICD-10-CM | POA: Diagnosis not present

## 2022-05-08 NOTE — Progress Notes (Signed)
Subjective:    Patient ID: Andrew [name], male    DOB: Apr 18, 1941, 81 y.o.   MRN: [id number]  Patient is here today requesting that we check his thyroid.  He is concerned that is causing his weight loss.  We checked his thyroid in December and it was normal.  He also wanted to review the x-rays that we took of the cervical spine and his thoracic spine.  X-rays reveal degenerative disc disease both in the cervical and thoracic spine.  He is yet to try the Zanaflex that I recommended Past Medical History:  Diagnosis Date   Allergy    grass etc.   Barrett's esophagus    BPH (benign prostatic hyperplasia)    Chest pain, atypical    Chronic kidney disease    kidney stones   Coronary artery disease    GERD (gastroesophageal reflux disease)    Barrett's   Hernia    Hyperlipidemia    Hypertension    Myocardial infarction ([place])    Thrombocytopenia ([place])    Ulcer 1960   Past Surgical History:  Procedure Laterality Date   CARDIAC CATHETERIZATION  02/14/2007   MITRAL REGURGITATION. LV NORMAL   CHOLECYSTECTOMY     COLONOSCOPY     CORONARY ARTERY BYPASS GRAFT     twice  2 by passes each time   INGUINAL HERNIA REPAIR     bilateral done twice   LITHOTRIPSY     UPPER GASTROINTESTINAL ENDOSCOPY     Current Outpatient Medications on File Prior to Visit  Medication Sig Dispense Refill   Accu-Chek Softclix Lancets lancets TEST BLOOD SUGAR ONE TIME DAILY 100 each 1   Alcohol Swabs (DROPSAFE ALCOHOL PREP) 70 % PADS USE TOPICALLY ONE TIME DAILY 100 each 1   amLODipine (NORVASC) 5 MG tablet TAKE 1 TABLET(5 MG) BY MOUTH DAILY 90 tablet 3   aspirin 81 MG tablet Take 81 mg by mouth daily.     Blood Glucose Monitoring Suppl (ACCU-CHEK GUIDE ME) w/Device KIT Use as instructed to monitor FSBS 1x daily. Dx: E11.9 1 kit 1   Cholecalciferol (VITAMIN D) 2000 units CAPS Take 1 capsule by mouth daily.     finasteride (PROSCAR) 5 MG tablet TAKE 1 TABLET(5 MG) BY MOUTH DAILY. 90 tablet 3   glucose blood  (ACCU-CHEK GUIDE) test strip TEST BLOOD SUGAR ONE TIME DAILY AS DIRECTED 100 strip 0   lansoprazole (PREVACID) 30 MG capsule Take 1 capsule (30 mg total) by mouth daily. 90 capsule 3   levocetirizine (XYZAL) 5 MG tablet Take 1 tablet (5 mg total) by mouth every evening. 90 tablet 1   metFORMIN (GLUCOPHAGE-XR) 500 MG 24 hr tablet TAKE 2 TABLETS(1000 MG) BY MOUTH DAILY WITH BREAKFAST 180 tablet 3   metoprolol succinate (TOPROL-XL) 25 MG 24 hr tablet TAKE 1/2 TABLET EVERY DAY 45 tablet 2   Multiple Vitamin (MULTIVITAMIN) tablet Take 1 tablet by mouth daily.     nitroGLYCERIN (NITROSTAT) 0.4 MG SL tablet Place 1 tablet (0.4 mg total) under the tongue every 5 (five) minutes as needed. 45 tablet 3   olopatadine (PATADAY) 0.1 % ophthalmic solution Place 1 drop into both eyes 2 (two) times daily. 5 mL 12   Omega-3 Fatty Acids (FISH OIL) 1000 MG CPDR Take 1,000 mg by mouth every morning.     simvastatin (ZOCOR) 10 MG tablet TAKE 1 TABLET AT BEDTIME 90 tablet 3   tamsulosin (FLOMAX) 0.4 MG CAPS capsule Take 1 capsule (0.4 mg total) by mouth daily.  90 capsule 1   tiZANidine (ZANAFLEX) 2 MG tablet Take 1 tablet (2 mg total) by mouth every 6 (six) hours as needed for muscle spasms. 30 tablet 1   No current facility-administered medications on file prior to visit.   Allergies  Allergen Reactions   Imdur [Isosorbide Mononitrate]     Unknown, per pt    Meloxicam Swelling    Facial swelling    Social History   Socioeconomic History   Marital status: Married    Spouse name: Mary   Number of children: 2   Years of education: Not on file   Highest education level: Not on file  Occupational History   Occupation: Retired  Tobacco Use   Smoking status: Never   Smokeless tobacco: Never  Vaping Use   Vaping Use: Never used  Substance and Sexual Activity   Alcohol use: No   Drug use: No   Sexual activity: Not Currently  Other Topics Concern   Not on file  Social History Narrative   Married in  1961.   6 grandchildren   Social Determinants of Health   Financial Resource Strain: Low Risk  (09/26/2021)   Overall Financial Resource Strain (CARDIA)    Difficulty of Paying Living Expenses: Not hard at all  Food Insecurity: No Food Insecurity (09/26/2021)   Hunger Vital Sign    Worried About Running Out of Food in the Last Year: Never true    Ran Out of Food in the Last Year: Never true  Transportation Needs: No Transportation Needs (09/26/2021)   PRAPARE - Administrator, Civil Service (Medical): No    Lack of Transportation (Non-Medical): No  Physical Activity: Insufficiently Active (09/26/2021)   Exercise Vital Sign    Days of Exercise per Week: 5 days    Minutes of Exercise per Session: 20 min  Stress: No Stress Concern Present (09/26/2021)   [employer] of Occupational Health - Occupational Stress Questionnaire    Feeling of Stress : Not at all  Social Connections: Socially Integrated (09/26/2021)   Social Connection and Isolation Panel [NHANES]    Frequency of Communication with Friends and Family: Three times a week    Frequency of Social Gatherings with Friends and Family: Three times a week    Attends Religious Services: More than 4 times per year    Active Member of Clubs or Organizations: Yes    Attends Banker Meetings: More than 4 times per year    Marital Status: Married  Catering manager Violence: Not At Risk (09/26/2021)   Humiliation, Afraid, Rape, and Kick questionnaire    Fear of Current or Ex-Partner: No    Emotionally Abused: No    Physically Abused: No    Sexually Abused: No      Review of Systems  All other systems reviewed and are negative.      Objective:   Physical Exam Vitals reviewed.  Constitutional:      Appearance: Normal appearance.  Cardiovascular:     Rate and Rhythm: Normal rate and regular rhythm.     Heart sounds: Normal heart sounds. No murmur heard.    No friction rub. No gallop.   Pulmonary:     Effort: Pulmonary effort is normal. No respiratory distress.     Breath sounds: Normal breath sounds and air entry. No stridor, decreased air movement or transmitted upper airway sounds. No wheezing, rhonchi or rales.  Musculoskeletal:     Right lower leg: No edema.  Left lower leg: No edema.  Neurological:     Mental Status: He is alert.           Assessment & Plan:  Weight loss - Plan: TSH I will check a TSH level.

## 2022-05-09 LAB — TSH: TSH: 1.88 mIU/L (ref 0.40–4.50)

## 2022-06-01 ENCOUNTER — Encounter: Payer: Self-pay | Admitting: Cardiovascular Disease

## 2022-06-01 NOTE — Progress Notes (Unsigned)
Andrew [name] Date of Birth  1941/03/28 [facility] [address]    [place], [place]  [address] [phone]  Fax  [phone]   problem list 1. Coronary artery disease 2. Hyperlipidemia 3.  Previous notes.   Andrew [name] is a 81 year old gentleman with a history of coronary artery disease. He status post coronary artery bypass grafting. He also has a history of dyslipidemia.  He's recently been having some problems with back pain.  These episodes of back pain would typically occur when he is doing his normal household chores. It would typically occur in the middle of his back and radiate up through to the front of his chest and between the shoulder blades.  This was not similar to his previous episodes of angina prior to his bypass grafting.  He's been exercising on a regular basis and has not had these symptoms with exercise.   Sept. 11. 2014:  Andrew [name] is doing ok.  No angina.  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  No   Sept. 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  Raised a garden this years.  No CP. Works out regularly.    Oct. 4, 2016: Doing great.    i reviewed his labs with him.   Everything is stable .  Still raising cattle.  Beef prices are down quite a bit this year.    Oct. 24, 2017:   Doing well Still raising cows.    No CP .    September 29, 2017:  Andrew [name] is seen today.  He has a history of coronary artery disease and hyperlipidemia. No CP  Has some shortness of breath. Has had some lower back pain and abd.  Still raising cows .   Hurt his right arm while using his  tractor   His last heart catheterization was Feb 14, 2007. The left anterior descending artery is occluded.  The saphenous vein graft to the second diagonal artery is a large graft and fills the left anterior descending artery in a retrograde fashion.  There is a 90% stenosis in the mid/distal LAD. The LIMA is atretic and does not supply any significant flow to the  LAD. Left circumflex artery has minor luminal irregularities.  The left circumflex artery is dominant.   May 11, 2018:  Doing well.   Treadmill for about 15 to 20 minutes each day.  No episodes of chest pain.  Has occasional swelling in rignht ankle but this is related to SVG harvest  Recent lipid panel from May, 2019 all look good.  Total cholesterol is 100.  HDL is 36.  The LDL is 50.  The triglyceride level is 62. Does not take NTG   Aug. 25, 2020 :  Andrew [name] is seen today for follow up visit Hx of CABG in 1998 and then again in 2008 Doing well.  No cp or dyspnea . Still puts in a garden.   Raises cows.  Limited by a right shoulder injury .   June 04, 2020: Andrew [name] is seen today for a follow-up visit.  He has a history of coronary artery disease and coronary artery bypass grafting x2. No cp,  No dsypnea.   Aug. 19, 2022: Andrew [name] is seen today for follow up of his CAD and CABG. No CP ,   walks on treadmill on occasion   Lipids from Aug. 16, show elevated glucose  Lipids look good   Aug. 22, 2023 Andrew [name] is seen today for  follow up of his CAD / CABG   Current Outpatient Medications on File Prior to Visit  Medication Sig Dispense Refill   Accu-Chek Softclix Lancets lancets TEST BLOOD SUGAR ONE TIME DAILY 100 each 1   Alcohol Swabs (DROPSAFE ALCOHOL PREP) 70 % PADS USE TOPICALLY ONE TIME DAILY 100 each 1   amLODipine (NORVASC) 5 MG tablet TAKE 1 TABLET(5 MG) BY MOUTH DAILY 90 tablet 3   aspirin 81 MG tablet Take 81 mg by mouth daily.     Blood Glucose Monitoring Suppl (ACCU-CHEK GUIDE ME) w/Device KIT Use as instructed to monitor FSBS 1x daily. Dx: E11.9 1 kit 1   Cholecalciferol (VITAMIN D) 2000 units CAPS Take 1 capsule by mouth daily.     finasteride (PROSCAR) 5 MG tablet TAKE 1 TABLET(5 MG) BY MOUTH DAILY. 90 tablet 3   glucose blood (ACCU-CHEK GUIDE) test strip TEST BLOOD SUGAR ONE TIME DAILY AS DIRECTED 100 strip 0   lansoprazole (PREVACID) 30 MG capsule Take 1 capsule (30  mg total) by mouth daily. 90 capsule 3   levocetirizine (XYZAL) 5 MG tablet Take 1 tablet (5 mg total) by mouth every evening. 90 tablet 1   metFORMIN (GLUCOPHAGE-XR) 500 MG 24 hr tablet TAKE 2 TABLETS(1000 MG) BY MOUTH DAILY WITH BREAKFAST 180 tablet 3   metoprolol succinate (TOPROL-XL) 25 MG 24 hr tablet TAKE 1/2 TABLET EVERY DAY 45 tablet 2   Multiple Vitamin (MULTIVITAMIN) tablet Take 1 tablet by mouth daily.     nitroGLYCERIN (NITROSTAT) 0.4 MG SL tablet Place 1 tablet (0.4 mg total) under the tongue every 5 (five) minutes as needed. 45 tablet 3   olopatadine (PATADAY) 0.1 % ophthalmic solution Place 1 drop into both eyes 2 (two) times daily. 5 mL 12   Omega-3 Fatty Acids (FISH OIL) 1000 MG CPDR Take 1,000 mg by mouth every morning.     simvastatin (ZOCOR) 10 MG tablet TAKE 1 TABLET AT BEDTIME 90 tablet 3   tamsulosin (FLOMAX) 0.4 MG CAPS capsule Take 1 capsule (0.4 mg total) by mouth daily. 90 capsule 1   tiZANidine (ZANAFLEX) 2 MG tablet Take 1 tablet (2 mg total) by mouth every 6 (six) hours as needed for muscle spasms. 30 tablet 1   No current facility-administered medications on file prior to visit.    Allergies  Allergen Reactions   Imdur [Isosorbide Mononitrate]     Unknown, per pt    Meloxicam Swelling    Facial swelling     Past Medical History:  Diagnosis Date   Allergy    grass etc.   Barrett's esophagus    BPH (benign prostatic hyperplasia)    Chest pain, atypical    Chronic kidney disease    kidney stones   Coronary artery disease    GERD (gastroesophageal reflux disease)    Barrett's   Hernia    Hyperlipidemia    Hypertension    Myocardial infarction ([place])    Thrombocytopenia ([place])    Ulcer 1960    Past Surgical History:  Procedure Laterality Date   CARDIAC CATHETERIZATION  02/14/2007   MITRAL REGURGITATION. LV NORMAL   CHOLECYSTECTOMY     COLONOSCOPY     CORONARY ARTERY BYPASS GRAFT     twice  2 by passes each time   INGUINAL HERNIA REPAIR      bilateral done twice   LITHOTRIPSY     UPPER GASTROINTESTINAL ENDOSCOPY      Social History   Tobacco Use  Smoking Status Never  Smokeless Tobacco Never    Social History   Substance and Sexual Activity  Alcohol Use No    Family History  Problem Relation Age of Onset   Stroke Father    Heart attack Mother    Heart attack Maternal Grandmother    Heart attack Maternal Grandfather    Heart disease Sister    Heart disease Brother    Heart disease Sister    Heart disease Brother    Prostate cancer Brother    Colon cancer Neg Hx    Esophageal cancer Neg Hx    Rectal cancer Neg Hx    Stomach cancer Neg Hx     Reviw of Systems:   Noted in current history.   Physical Exam: There were no vitals taken for this visit.  GEN:  Well nourished, well developed in no acute distress HEENT: Normal NECK: No JVD; No carotid bruits LYMPHATICS: No lymphadenopathy CARDIAC: RRR ***, no murmurs, rubs, gallops RESPIRATORY:  Clear to auscultation without rales, wheezing or rhonchi  ABDOMEN: Soft, non-tender, non-distended MUSCULOSKELETAL:  No edema; No deformity  SKIN: Warm and dry NEUROLOGIC:  Alert and oriented x 3    ECG:        Assessment / Plan:    1. Coronary artery disease     2. Hyperlipidemia-          3. Carotid artery disease:   .     4.  Hyperglycemia:     Mertie Moores, MD  06/01/2022 7:17 PM    [facility] [place],  [place], [place]  [address] Pager [phone] Phone: [phone]; Fax: [phone]

## 2022-06-02 ENCOUNTER — Encounter: Payer: Self-pay | Admitting: Cardiovascular Disease

## 2022-06-02 ENCOUNTER — Ambulatory Visit: Payer: Medicare HMO | Admitting: Cardiovascular Disease

## 2022-06-02 VITALS — BP 114/50 | HR 75 | Ht 69.0 in | Wt 148.4 lb

## 2022-06-02 DIAGNOSIS — I2510 Atherosclerotic heart disease of native coronary artery without angina pectoris: Secondary | ICD-10-CM | POA: Diagnosis not present

## 2022-06-02 DIAGNOSIS — E782 Mixed hyperlipidemia: Secondary | ICD-10-CM

## 2022-06-02 LAB — LIPID PANEL
Chol/HDL Ratio: 2.5 ratio (ref 0.0–5.0)
Cholesterol, Total: 99 mg/dL — ABNORMAL LOW (ref 100–199)
HDL: 40 mg/dL
LDL Chol Calc (NIH): 43 mg/dL (ref 0–99)
Triglycerides: 79 mg/dL (ref 0–149)
VLDL Cholesterol Cal: 16 mg/dL (ref 5–40)

## 2022-06-02 LAB — BASIC METABOLIC PANEL WITH GFR
BUN/Creatinine Ratio: 25 — ABNORMAL HIGH (ref 10–24)
BUN: 20 mg/dL (ref 8–27)
CO2: 25 mmol/L (ref 20–29)
Calcium: 9.3 mg/dL (ref 8.6–10.2)
Chloride: 101 mmol/L (ref 96–106)
Creatinine, Ser: 0.79 mg/dL (ref 0.76–1.27)
Glucose: 147 mg/dL — ABNORMAL HIGH (ref 70–99)
Potassium: 4.1 mmol/L (ref 3.5–5.2)
Sodium: 141 mmol/L (ref 134–144)
eGFR: 88 mL/min/1.73

## 2022-06-02 LAB — ALT: ALT: 13 IU/L (ref 0–44)

## 2022-06-02 MED ORDER — NITROGLYCERIN 0.4 MG SL SUBL: SUBLINGUAL_TABLET | SUBLINGUAL | 3 refills | Status: DC

## 2022-06-02 NOTE — Patient Instructions (Signed)
Medication Instructions:  REFILLED Nitroglycerin *If you need a refill on your cardiac medications before your next appointment, please call your pharmacy*   Lab Work: LIPIDS< ALT< BMET today If you have labs (blood work) drawn today and your tests are completely normal, you will receive your results only by: [place] (if you have MyChart) OR A paper copy in the mail If you have any lab test that is abnormal or we need to change your treatment, we will call you to review the results.   Testing/Procedures: NONE   Follow-Up: At [facility], you and your health needs are our priority.  As part of our continuing mission to provide you with exceptional heart care, we have created designated Provider Care Teams.  These Care Teams include your primary Cardiologist (physician) and Advanced Practice Providers (APPs -  Physician Assistants and Nurse Practitioners) who all work together to provide you with the care you need, when you need it.  We recommend signing up for the patient portal called "MyChart".  Sign up information is provided on this After Visit Summary.  MyChart is used to connect with patients for Virtual Visits (Telemedicine).  Patients are able to view lab/test results, encounter notes, upcoming appointments, etc.  Non-urgent messages can be sent to your provider as well.   To learn more about what you can do with MyChart, go to [email or web address].    Your next appointment:   1 year(s)  The format for your next appointment:   In Person  Provider:   Mertie Moores, MD      Important Information About Sugar

## 2022-06-11 ENCOUNTER — Other Ambulatory Visit: Payer: Self-pay | Admitting: Family Medicine

## 2022-06-12 NOTE — Telephone Encounter (Signed)
Rx 01/15/22 #90 1RF- too soon Requested Prescriptions  Pending Prescriptions Disp Refills  . levocetirizine (XYZAL) 5 MG tablet [Pharmacy Med Name: LEVOCETIRIZINE DIHYDROCHLORIDE 5 MG Tablet] 90 tablet 1    Sig: TAKE 1 TABLET EVERY EVENING     Ear, Nose, and Throat:  Antihistamines - levocetirizine dihydrochloride Passed - 06/11/2022 11:14 AM      Passed - Cr in normal range and within 360 days    Creat  Date Value Ref Range Status  04/20/2022 0.89 0.70 - 1.22 mg/dL Final   Creatinine, Ser  Date Value Ref Range Status  06/02/2022 0.79 0.76 - 1.27 mg/dL Final         Passed - eGFR is 10 or above and within 360 days    GFR, Est African American  Date Value Ref Range Status  04/09/2021 95 > OR = 60 mL/min/1.92m2 Final   GFR, Est Non African American  Date Value Ref Range Status  04/09/2021 82 > OR = 60 mL/min/1.85m2 Final   GFR  Date Value Ref Range Status  07/08/2015 91.76 >60.00 mL/min Final   eGFR  Date Value Ref Range Status  06/02/2022 88 >59 mL/min/1.73 Final         Passed - Valid encounter within last 12 months    Recent Outpatient Visits          7 months ago LLQ abdominal pain   [place] [facility] Schaumann, Cammie Mcgee, MD   8 months ago Encounter for [employer] annual wellness exam   [place] Pickard, Cammie Mcgee, MD   10 months ago Plantar fasciitis   [place] Susy Frizzle, MD   1 year ago Controlled type 2 diabetes mellitus with complication, without long-term current use of insulin ([place])   [place] Susy Frizzle, MD   1 year ago Controlled type 2 diabetes mellitus with complication, without long-term current use of insulin ([place])   [facility], Cammie Mcgee, MD

## 2022-06-17 ENCOUNTER — Other Ambulatory Visit: Payer: Self-pay | Admitting: Family Medicine

## 2022-06-18 NOTE — Telephone Encounter (Signed)
Requested Prescriptions  Pending Prescriptions Disp Refills  . lansoprazole (PREVACID) 30 MG capsule [Pharmacy Med Name: LANSOPRAZOLE 30 MG Capsule Delayed Release] 90 capsule 1    Sig: TAKE 1 CAPSULE EVERY DAY     Gastroenterology: Proton Pump Inhibitors 2 Passed - 06/17/2022  1:42 PM      Passed - ALT in normal range and within 360 days    ALT  Date Value Ref Range Status  06/02/2022 13 0 - 44 IU/L Final         Passed - AST in normal range and within 360 days    AST  Date Value Ref Range Status  04/20/2022 12 10 - 35 U/L Final         Passed - Valid encounter within last 12 months    Recent Outpatient Visits          7 months ago LLQ abdominal pain   [place] Pickard, Cammie Mcgee, MD   8 months ago Encounter for [employer] annual wellness exam   [place] Pickard, Cammie Mcgee, MD   11 months ago Plantar fasciitis   [place] Susy Frizzle, MD   1 year ago Controlled type 2 diabetes mellitus with complication, without long-term current use of insulin ([place])   [place] Susy Frizzle, MD   1 year ago Controlled type 2 diabetes mellitus with complication, without long-term current use of insulin ([place])   [facility], Cammie Mcgee, MD

## 2022-08-04 ENCOUNTER — Other Ambulatory Visit: Payer: Medicare HMO

## 2022-08-04 DIAGNOSIS — I10 Essential (primary) hypertension: Secondary | ICD-10-CM

## 2022-08-04 DIAGNOSIS — D696 Thrombocytopenia, unspecified: Secondary | ICD-10-CM | POA: Diagnosis not present

## 2022-08-04 DIAGNOSIS — E785 Hyperlipidemia, unspecified: Secondary | ICD-10-CM

## 2022-08-04 DIAGNOSIS — R7303 Prediabetes: Secondary | ICD-10-CM

## 2022-08-04 DIAGNOSIS — R634 Abnormal weight loss: Secondary | ICD-10-CM

## 2022-08-05 LAB — COMPLETE METABOLIC PANEL WITH GFR
AG Ratio: 2.3 (calc) (ref 1.0–2.5)
ALT: 14 U/L (ref 9–46)
AST: 15 U/L (ref 10–35)
Albumin: 4.2 g/dL (ref 3.6–5.1)
Alkaline phosphatase (APISO): 47 U/L (ref 35–144)
BUN: 19 mg/dL (ref 7–25)
CO2: 28 mmol/L (ref 20–32)
Calcium: 9.1 mg/dL (ref 8.6–10.3)
Chloride: 104 mmol/L (ref 98–110)
Creat: 0.81 mg/dL (ref 0.70–1.22)
Globulin: 1.8 g/dL (calc) — ABNORMAL LOW (ref 1.9–3.7)
Glucose, Bld: 143 mg/dL — ABNORMAL HIGH (ref 65–99)
Potassium: 4.3 mmol/L (ref 3.5–5.3)
Sodium: 140 mmol/L (ref 135–146)
Total Bilirubin: 0.5 mg/dL (ref 0.2–1.2)
Total Protein: 6 g/dL — ABNORMAL LOW (ref 6.1–8.1)
eGFR: 87 mL/min/{1.73_m2} (ref >=60)

## 2022-08-05 LAB — LIPID PANEL
Cholesterol: 102 mg/dL (ref <200)
HDL: 43 mg/dL (ref >=40)
LDL Cholesterol (Calc): 45 mg/dL (calc)
Non-HDL Cholesterol (Calc): 59 mg/dL (calc) (ref <130)
Total CHOL/HDL Ratio: 2.4 (calc) (ref <5.0)
Triglycerides: 56 mg/dL (ref <150)

## 2022-08-05 LAB — HEMOGLOBIN A1C
Hgb A1c MFr Bld: 6.4 % of total Hgb — ABNORMAL HIGH (ref <5.7)
Mean Plasma Glucose: 137 mg/dL
eAG (mmol/L): 7.6 mmol/L

## 2022-08-07 ENCOUNTER — Ambulatory Visit (INDEPENDENT_AMBULATORY_CARE_PROVIDER_SITE_OTHER): Payer: Medicare HMO | Admitting: Family Medicine

## 2022-08-07 ENCOUNTER — Encounter: Payer: Self-pay | Admitting: Family Medicine

## 2022-08-07 VITALS — HR 78 | Ht 69.0 in | Wt 151.0 lb

## 2022-08-07 DIAGNOSIS — D696 Thrombocytopenia, unspecified: Secondary | ICD-10-CM | POA: Diagnosis not present

## 2022-08-07 DIAGNOSIS — I2510 Atherosclerotic heart disease of native coronary artery without angina pectoris: Secondary | ICD-10-CM | POA: Diagnosis not present

## 2022-08-07 DIAGNOSIS — E785 Hyperlipidemia, unspecified: Secondary | ICD-10-CM | POA: Diagnosis not present

## 2022-08-07 DIAGNOSIS — N401 Enlarged prostate with lower urinary tract symptoms: Secondary | ICD-10-CM

## 2022-08-07 DIAGNOSIS — E118 Type 2 diabetes mellitus with unspecified complications: Secondary | ICD-10-CM

## 2022-08-07 DIAGNOSIS — R350 Frequency of micturition: Secondary | ICD-10-CM

## 2022-08-07 DIAGNOSIS — I10 Essential (primary) hypertension: Secondary | ICD-10-CM | POA: Diagnosis not present

## 2022-08-07 NOTE — Progress Notes (Signed)
Subjective:    Patient ID: Andrew [name], male    DOB: 10/16/1940, 81 y.o.   MRN: [id number] Wt Readings from Last 3 Encounters:  08/07/22 151 lb (68.5 kg)  06/02/22 148 lb 6.4 oz (67.3 kg)  05/08/22 147 lb (66.7 kg)   Patient presents today for his regular checkup.  He reports decreased appetite and weight loss however according to our scales he is actually gaining 4 pounds since July.  He denies any bloody diarrhea.  He denies any abdominal pain.  He does report 3-4 episodes of nocturia.  He denies any dysuria.  He denies any chest pain or shortness of breath or dyspnea on exertion.  He has a history of thrombocytopenia and his last platelet count was just above 50.  We are due to repeat that.  His most recent lab work is listed below.  His A1c is outstanding at 6.4 and his blood pressure today is excellent. Lab on 08/04/2022  Component Date Value Ref Range Status   Glucose, Bld 08/04/2022 143 (H)  65 - 99 mg/dL Final   Comment: .            Fasting reference interval . For someone without known diabetes, a glucose value >125 mg/dL indicates that they may have diabetes and this should be confirmed with a follow-up test. .    BUN 08/04/2022 19  7 - 25 mg/dL Final   Creat 08/04/2022 0.81  0.70 - 1.22 mg/dL Final   eGFR 08/04/2022 87  > OR = 60 mL/min/1.38m Final   BUN/Creatinine Ratio 08/04/2022 SEE NOTE:  6 - 22 (calc) Final   Comment:    Not Reported: BUN and Creatinine are within    reference range. .    Sodium 08/04/2022 140  135 - 146 mmol/L Final   Potassium 08/04/2022 4.3  3.5 - 5.3 mmol/L Final   Chloride 08/04/2022 104  98 - 110 mmol/L Final   CO2 08/04/2022 28  20 - 32 mmol/L Final   Calcium 08/04/2022 9.1  8.6 - 10.3 mg/dL Final   Total Protein 08/04/2022 6.0 (L)  6.1 - 8.1 g/dL Final   Albumin 08/04/2022 4.2  3.6 - 5.1 g/dL Final   Globulin 08/04/2022 1.8 (L)  1.9 - 3.7 g/dL (calc) Final   AG Ratio 08/04/2022 2.3  1.0 - 2.5 (calc) Final   Total Bilirubin  08/04/2022 0.5  0.2 - 1.2 mg/dL Final   Alkaline phosphatase (APISO) 08/04/2022 47  35 - 144 U/L Final   AST 08/04/2022 15  10 - 35 U/L Final   ALT 08/04/2022 14  9 - 46 U/L Final   Cholesterol 08/04/2022 102  <200 mg/dL Final   HDL 08/04/2022 43  > OR = 40 mg/dL Final   Triglycerides 08/04/2022 56  <150 mg/dL Final   LDL Cholesterol (Calc) 08/04/2022 45  mg/dL (calc) Final   Comment: Reference range: <100 . Desirable range <100 mg/dL for primary prevention;   <70 mg/dL for patients with CHD or diabetic patients  with > or = 2 CHD risk factors. .Marland KitchenLDL-C is now calculated using the Martin-Hopkins  calculation, which is a validated novel method providing  better accuracy than the Friedewald equation in the  estimation of LDL-C.  MCresenciano Genreet al. JAnnamaria Helling 26295;284(13: 2061-2068  (http://education.[employer].com/faq/FAQ164)    Total CHOL/HDL Ratio 08/04/2022 2.4  <5.0 (calc) Final   Non-HDL Cholesterol (Calc) 08/04/2022 59  <130 mg/dL (calc) Final   Comment: For patients with diabetes plus 1 major  ASCVD risk  factor, treating to a non-HDL-C goal of <100 mg/dL  (LDL-C of <70 mg/dL) is considered a therapeutic  option.    Hgb A1c MFr Bld 08/04/2022 6.4 (H)  <5.7 % of total Hgb Final   Comment: For someone without known diabetes, a hemoglobin  A1c value between 5.7% and 6.4% is consistent with prediabetes and should be confirmed with a  follow-up test. . For someone with known diabetes, a value <7% indicates that their diabetes is well controlled. A1c targets should be individualized based on duration of diabetes, age, comorbid conditions, and other considerations. . This assay result is consistent with an increased risk of diabetes. . Currently, no consensus exists regarding use of hemoglobin A1c for diagnosis of diabetes for children. .    Mean Plasma Glucose 08/04/2022 137  mg/dL Final   eAG (mmol/L) 08/04/2022 7.6  mmol/L Final    Past Medical History:  Diagnosis  Date   Allergy    grass etc.   Barrett's esophagus    BPH (benign prostatic hyperplasia)    Chest pain, atypical    Chronic kidney disease    kidney stones   Coronary artery disease    GERD (gastroesophageal reflux disease)    Barrett's   Hernia    Hyperlipidemia    Hypertension    Myocardial infarction ([place])    Thrombocytopenia ([place])    Ulcer 1960   Past Surgical History:  Procedure Laterality Date   CARDIAC CATHETERIZATION  02/14/2007   MITRAL REGURGITATION. LV NORMAL   CHOLECYSTECTOMY     COLONOSCOPY     CORONARY ARTERY BYPASS GRAFT     twice  2 by passes each time   INGUINAL HERNIA REPAIR     bilateral done twice   LITHOTRIPSY     UPPER GASTROINTESTINAL ENDOSCOPY     Current Outpatient Medications on File Prior to Visit  Medication Sig Dispense Refill   Accu-Chek Softclix Lancets lancets TEST BLOOD SUGAR ONE TIME DAILY 100 each 1   Alcohol Swabs (DROPSAFE ALCOHOL PREP) 70 % PADS USE TOPICALLY ONE TIME DAILY 100 each 1   amLODipine (NORVASC) 5 MG tablet TAKE 1 TABLET(5 MG) BY MOUTH DAILY 90 tablet 3   aspirin 81 MG tablet Take 81 mg by mouth daily.     Blood Glucose Monitoring Suppl (ACCU-CHEK GUIDE ME) w/Device KIT Use as instructed to monitor FSBS 1x daily. Dx: E11.9 1 kit 1   Cholecalciferol (VITAMIN D) 2000 units CAPS Take 1 capsule by mouth daily.     finasteride (PROSCAR) 5 MG tablet TAKE 1 TABLET(5 MG) BY MOUTH DAILY. 90 tablet 3   glucose blood (ACCU-CHEK GUIDE) test strip TEST BLOOD SUGAR ONE TIME DAILY AS DIRECTED 100 strip 0   lansoprazole (PREVACID) 30 MG capsule TAKE 1 CAPSULE EVERY DAY 90 capsule 1   levocetirizine (XYZAL) 5 MG tablet Take 1 tablet (5 mg total) by mouth every evening. (Patient not taking: Reported on 06/02/2022) 90 tablet 1   metFORMIN (GLUCOPHAGE-XR) 500 MG 24 hr tablet TAKE 2 TABLETS(1000 MG) BY MOUTH DAILY WITH BREAKFAST 180 tablet 3   metoprolol succinate (TOPROL-XL) 25 MG 24 hr tablet TAKE 1/2 TABLET EVERY DAY 45 tablet 2   Multiple  Vitamin (MULTIVITAMIN) tablet Take 1 tablet by mouth daily.     nitroGLYCERIN (NITROSTAT) 0.4 MG SL tablet Dissolve 1 tablet under the tongue every 5 minutes as needed for chest pain. Max of 3 doses, then 911. 75 tablet 3   olopatadine (PATADAY) 0.1 % ophthalmic  solution Place 1 drop into both eyes 2 (two) times daily. 5 mL 12   Omega-3 Fatty Acids (FISH OIL) 1000 MG CPDR Take 1,000 mg by mouth every morning.     simvastatin (ZOCOR) 10 MG tablet TAKE 1 TABLET AT BEDTIME 90 tablet 3   tamsulosin (FLOMAX) 0.4 MG CAPS capsule Take 1 capsule (0.4 mg total) by mouth daily. 90 capsule 1   tiZANidine (ZANAFLEX) 2 MG tablet Take 1 tablet (2 mg total) by mouth every 6 (six) hours as needed for muscle spasms. (Patient not taking: Reported on 06/02/2022) 30 tablet 1   No current facility-administered medications on file prior to visit.   Allergies  Allergen Reactions   Imdur [Isosorbide Mononitrate]     Unknown, per pt    Meloxicam Swelling    Facial swelling    Social History   Socioeconomic History   Marital status: Married    Spouse name: [place]   Number of children: 2   Years of education: Not on file   Highest education level: Not on file  Occupational History   Occupation: Retired  Tobacco Use   Smoking status: Never   Smokeless tobacco: Never  Vaping Use   Vaping Use: Never used  Substance and Sexual Activity   Alcohol use: No   Drug use: No   Sexual activity: Not Currently  Other Topics Concern   Not on file  Social History Narrative   Married in 1961.   6 grandchildren   Social Determinants of Health   Financial Resource Strain: Low Risk  (09/26/2021)   Overall Financial Resource Strain (CARDIA)    Difficulty of Paying Living Expenses: Not hard at all  Food Insecurity: No Food Insecurity (09/26/2021)   Hunger Vital Sign    Worried About Running Out of Food in the Last Year: Never true    Laurium in the Last Year: Never true  Transportation Needs: No  Transportation Needs (09/26/2021)   PRAPARE - Hydrologist (Medical): No    Lack of Transportation (Non-Medical): No  Physical Activity: Insufficiently Active (09/26/2021)   Exercise Vital Sign    Days of Exercise per Week: 5 days    Minutes of Exercise per Session: 20 min  Stress: No Stress Concern Present (09/26/2021)   [place]    Feeling of Stress : Not at all  Social Connections: [place] (09/26/2021)   Social Connection and Isolation Panel [NHANES]    Frequency of Communication with Friends and Family: Three times a week    Frequency of Social Gatherings with Friends and Family: Three times a week    Attends Religious Services: More than 4 times per year    Active Member of Clubs or Organizations: Yes    Attends Archivist Meetings: More than 4 times per year    Marital Status: Married  Human resources officer Violence: Not At Risk (09/26/2021)   Humiliation, Afraid, Rape, and Kick questionnaire    Fear of Current or Ex-Partner: No    Emotionally Abused: No    Physically Abused: No    Sexually Abused: No      Review of Systems  All other systems reviewed and are negative.      Objective:   Physical Exam Vitals reviewed.  Constitutional:      Appearance: Normal appearance.  Cardiovascular:     Rate and Rhythm: Normal rate and regular rhythm.  Heart sounds: Normal heart sounds. No murmur heard.    No friction rub. No gallop.  Pulmonary:     Effort: Pulmonary effort is normal. No respiratory distress.     Breath sounds: Normal breath sounds and air entry. No stridor, decreased air movement or transmitted upper airway sounds. No wheezing, rhonchi or rales.  Musculoskeletal:     Right lower leg: No edema.     Left lower leg: No edema.  Neurological:     Mental Status: He is alert.           Assessment & Plan:  Controlled type 2 diabetes mellitus  with complication, without long-term current use of insulin (HCC)  Primary hypertension  Coronary artery disease involving native coronary artery of native heart without angina pectoris  Hyperlipidemia, unspecified hyperlipidemia type  Benign prostatic hyperplasia with urinary frequency - Plan: PSA  Thrombocytopenia ([place]) - Plan: CBC with Differential I am extremely happy with his blood pressure, his hemoglobin A1c, and his cholesterol.  I believe some of the decreasing in muscle mass and weight and appetite is related to age.  I do not see any significant weight loss that makes me concerned that there is a severe underlying medical issue as his weight is stayed stable over the last several months and he denies any other symptoms.  I did offer the patient Remeron as an appetite stimulant however he declines taking an antidepressant.  I certainly do not feel that his weight loss rises to justify something like Megace or Marinol

## 2022-08-08 LAB — CBC WITH DIFFERENTIAL/PLATELET
Absolute Monocytes: 409 cells/uL (ref 200–950)
Basophils Absolute: 33 cells/uL (ref 0–200)
Basophils Relative: 0.5 %
Eosinophils Absolute: 73 cells/uL (ref 15–500)
Eosinophils Relative: 1.1 %
HCT: 37.9 % — ABNORMAL LOW (ref 38.5–50.0)
Hemoglobin: 12.8 g/dL — ABNORMAL LOW (ref 13.2–17.1)
Lymphs Abs: 1135 cells/uL (ref 850–3900)
MCH: 30.5 pg (ref 27.0–33.0)
MCHC: 33.8 g/dL (ref 32.0–36.0)
MCV: 90.2 fL (ref 80.0–100.0)
MPV: 11.1 fL (ref 7.5–12.5)
Monocytes Relative: 6.2 %
Neutro Abs: 4950 cells/uL (ref 1500–7800)
Neutrophils Relative %: 75 %
Platelets: 260 10*3/uL (ref 140–400)
RBC: 4.2 10*6/uL (ref 4.20–5.80)
RDW: 11.9 % (ref 11.0–15.0)
Total Lymphocyte: 17.2 %
WBC: 6.6 10*3/uL (ref 3.8–10.8)

## 2022-08-08 LAB — PSA: PSA: 0.19 ng/mL (ref <=4.00)

## 2022-08-24 ENCOUNTER — Ambulatory Visit (INDEPENDENT_AMBULATORY_CARE_PROVIDER_SITE_OTHER): Payer: Medicare HMO | Admitting: Family Medicine

## 2022-08-24 VITALS — BP 130/80 | HR 72 | Ht 69.0 in | Wt 152.0 lb

## 2022-08-24 DIAGNOSIS — L989 Disorder of the skin and subcutaneous tissue, unspecified: Secondary | ICD-10-CM

## 2022-08-24 NOTE — Progress Notes (Signed)
Subjective:    Patient ID: Andrew [name], male    DOB: 1941-07-01, 81 y.o.   MRN: [id number]  Patient is concerned due to a lesion on his left shin that is not healing.  The lesion is roughly the size of a nickel.  He states that he ran into a stick.  It has a red border with a central clearing.  There is no erythema.  There is no fluctuance.  There is no purulent drainage.  There is no cellulitis however its been there for 2 or 3 weeks and its not healing.  There is actually no skin breakdown.  There is a scab-like a circle  Past Medical History:  Diagnosis Date   Allergy    grass etc.   Barrett's esophagus    BPH (benign prostatic hyperplasia)    Chest pain, atypical    Chronic kidney disease    kidney stones   Coronary artery disease    GERD (gastroesophageal reflux disease)    Barrett's   Hernia    Hyperlipidemia    Hypertension    Myocardial infarction ([place])    Thrombocytopenia ([place])    Ulcer 1960   Past Surgical History:  Procedure Laterality Date   CARDIAC CATHETERIZATION  02/14/2007   MITRAL REGURGITATION. LV NORMAL   CHOLECYSTECTOMY     COLONOSCOPY     CORONARY ARTERY BYPASS GRAFT     twice  2 by passes each time   INGUINAL HERNIA REPAIR     bilateral done twice   LITHOTRIPSY     UPPER GASTROINTESTINAL ENDOSCOPY     Current Outpatient Medications on File Prior to Visit  Medication Sig Dispense Refill   Accu-Chek Softclix Lancets lancets TEST BLOOD SUGAR ONE TIME DAILY 100 each 1   Alcohol Swabs (DROPSAFE ALCOHOL PREP) 70 % PADS USE TOPICALLY ONE TIME DAILY 100 each 1   amLODipine (NORVASC) 5 MG tablet TAKE 1 TABLET(5 MG) BY MOUTH DAILY 90 tablet 3   aspirin 81 MG tablet Take 81 mg by mouth daily.     Blood Glucose Monitoring Suppl (ACCU-CHEK GUIDE ME) w/Device KIT Use as instructed to monitor FSBS 1x daily. Dx: E11.9 1 kit 1   Cholecalciferol (VITAMIN D) 2000 units CAPS Take 1 capsule by mouth daily.     finasteride (PROSCAR) 5 MG tablet TAKE 1 TABLET(5 MG) BY  MOUTH DAILY. 90 tablet 3   glucose blood (ACCU-CHEK GUIDE) test strip TEST BLOOD SUGAR ONE TIME DAILY AS DIRECTED 100 strip 0   lansoprazole (PREVACID) 30 MG capsule TAKE 1 CAPSULE EVERY DAY 90 capsule 1   metFORMIN (GLUCOPHAGE-XR) 500 MG 24 hr tablet TAKE 2 TABLETS(1000 MG) BY MOUTH DAILY WITH BREAKFAST 180 tablet 3   metoprolol succinate (TOPROL-XL) 25 MG 24 hr tablet TAKE 1/2 TABLET EVERY DAY 45 tablet 2   Multiple Vitamin (MULTIVITAMIN) tablet Take 1 tablet by mouth daily.     nitroGLYCERIN (NITROSTAT) 0.4 MG SL tablet Dissolve 1 tablet under the tongue every 5 minutes as needed for chest pain. Max of 3 doses, then 911. 75 tablet 3   olopatadine (PATADAY) 0.1 % ophthalmic solution Place 1 drop into both eyes 2 (two) times daily. 5 mL 12   Omega-3 Fatty Acids (FISH OIL) 1000 MG CPDR Take 1,000 mg by mouth every morning.     simvastatin (ZOCOR) 10 MG tablet TAKE 1 TABLET AT BEDTIME 90 tablet 3   tamsulosin (FLOMAX) 0.4 MG CAPS capsule Take 1 capsule (0.4 mg total) by mouth daily.  90 capsule 1   tiZANidine (ZANAFLEX) 2 MG tablet Take 1 tablet (2 mg total) by mouth every 6 (six) hours as needed for muscle spasms. (Patient not taking: Reported on 06/02/2022) 30 tablet 1   No current facility-administered medications on file prior to visit.   Allergies  Allergen Reactions   Imdur [Isosorbide Mononitrate]     Unknown, per pt    Meloxicam Swelling    Facial swelling    Social History   Socioeconomic History   Marital status: Married    Spouse name: [place]   Number of children: 2   Years of education: Not on file   Highest education level: Not on file  Occupational History   Occupation: Retired  Tobacco Use   Smoking status: Never   Smokeless tobacco: Never  Vaping Use   Vaping Use: Never used  Substance and Sexual Activity   Alcohol use: No   Drug use: No   Sexual activity: Not Currently  Other Topics Concern   Not on file  Social History Narrative   Married in 1961.   6  grandchildren   Social Determinants of Health   Financial Resource Strain: Low Risk  (09/26/2021)   Overall Financial Resource Strain (CARDIA)    Difficulty of Paying Living Expenses: Not hard at all  Food Insecurity: No Food Insecurity (09/26/2021)   Hunger Vital Sign    Worried About Running Out of Food in the Last Year: Never true    [place] in the Last Year: Never true  Transportation Needs: No Transportation Needs (09/26/2021)   PRAPARE - Hydrologist (Medical): No    Lack of Transportation (Non-Medical): No  Physical Activity: Insufficiently Active (09/26/2021)   Exercise Vital Sign    Days of Exercise per Week: 5 days    Minutes of Exercise per Session: 20 min  Stress: No Stress Concern Present (09/26/2021)   [place]    Feeling of Stress : Not at all  Social Connections: [place] (09/26/2021)   Social Connection and Isolation Panel [NHANES]    Frequency of Communication with Friends and Family: Three times a week    Frequency of Social Gatherings with Friends and Family: Three times a week    Attends Religious Services: More than 4 times per year    Active Member of Clubs or Organizations: Yes    Attends Archivist Meetings: More than 4 times per year    Marital Status: Married  Human resources officer Violence: Not At Risk (09/26/2021)   Humiliation, Afraid, Rape, and Kick questionnaire    Fear of Current or Ex-Partner: No    Emotionally Abused: No    Physically Abused: No    Sexually Abused: No      Review of Systems  All other systems reviewed and are negative.      Objective:   Physical Exam Vitals reviewed.  Constitutional:      Appearance: Normal appearance.  Cardiovascular:     Rate and Rhythm: Normal rate and regular rhythm.     Heart sounds: Normal heart sounds. No murmur heard.    No friction rub. No gallop.  Pulmonary:      Effort: Pulmonary effort is normal. No respiratory distress.     Breath sounds: Normal breath sounds and air entry. No stridor, decreased air movement or transmitted upper airway sounds. No wheezing, rhonchi or rales.  Musculoskeletal:     Right  lower leg: No edema.     Left lower leg: No edema.  Neurological:     Mental Status: He is alert.     1.5 cm erythematous ring with central clearing      Assessment & Plan:  Skin lesion of left leg There is no evidence of cellulitis.  There is no true skin breakdown.  There is a circular ringlike abrasion with central clearing.  I recommended applying Polysporin to a Band-Aid and keeping the area covered and I anticipate that the lesion will gradually "scab up and peel off" over the next 3 weeks

## 2022-09-01 DIAGNOSIS — L308 Other specified dermatitis: Secondary | ICD-10-CM | POA: Diagnosis not present

## 2022-09-10 DIAGNOSIS — L921 Necrobiosis lipoidica, not elsewhere classified: Secondary | ICD-10-CM | POA: Diagnosis not present

## 2022-09-15 ENCOUNTER — Other Ambulatory Visit (INDEPENDENT_AMBULATORY_CARE_PROVIDER_SITE_OTHER): Payer: Medicare HMO

## 2022-09-15 ENCOUNTER — Ambulatory Visit: Payer: Medicare HMO

## 2022-09-15 DIAGNOSIS — Z23 Encounter for immunization: Secondary | ICD-10-CM

## 2022-10-02 ENCOUNTER — Telehealth: Payer: Self-pay

## 2022-10-02 ENCOUNTER — Other Ambulatory Visit: Payer: Self-pay | Admitting: Family Medicine

## 2022-10-02 MED ORDER — NIRMATRELVIR/RITONAVIR (PAXLOVID)TABLET: 3.0000 | ORAL_TABLET | Freq: Two times a day (BID) | ORAL | 0 refills | Status: AC

## 2022-10-02 NOTE — Telephone Encounter (Signed)
Pt's daughter called in and states she tested positive for Covid today. Pt was around daughter on Tuesday. Daughter, Jeannene Patella, is concerned that her dad may develop sx over the weekend and would not have any medication to treat. Pam asks if Paxlovid could be sent in for her dad, to pick up if needed? Thank you.

## 2022-10-14 ENCOUNTER — Telehealth: Payer: Self-pay | Admitting: Family Medicine

## 2022-10-14 NOTE — Telephone Encounter (Signed)
Left message for patient to call back and schedule Medicare Annual Wellness Visit (AWV) in office.   If not able to come in office, please offer to do virtually or by telephone.   Last AWV:09/26/2021  Please schedule at any time with [facility]-Nurse Health Advisor.  30 minute appointment  Any questions, please contact me at [phone]   Thank you,   [facility]  Ambulatory Clinical Support for [place] [facility] ??[phone] or ??[phone]

## 2022-10-15 ENCOUNTER — Other Ambulatory Visit: Payer: Self-pay | Admitting: Family Medicine

## 2022-10-15 DIAGNOSIS — L921 Necrobiosis lipoidica, not elsewhere classified: Secondary | ICD-10-CM | POA: Diagnosis not present

## 2022-10-16 ENCOUNTER — Inpatient Hospital Stay: Payer: Medicare HMO

## 2022-10-16 ENCOUNTER — Other Ambulatory Visit: Payer: Self-pay

## 2022-10-16 ENCOUNTER — Inpatient Hospital Stay: Payer: Medicare HMO | Attending: Physician Assistant | Admitting: Nurse Practitioner

## 2022-10-16 DIAGNOSIS — D696 Thrombocytopenia, unspecified: Secondary | ICD-10-CM

## 2022-10-16 LAB — CBC WITH DIFFERENTIAL/PLATELET
Abs Immature Granulocytes: 0.02 10*3/uL (ref 0.00–0.07)
Basophils Absolute: 0 10*3/uL (ref 0.0–0.1)
Basophils Relative: 1 %
Eosinophils Absolute: 0.1 10*3/uL (ref 0.0–0.5)
Eosinophils Relative: 1 %
HCT: 40.6 % (ref 39.0–52.0)
Hemoglobin: 13.6 g/dL (ref 13.0–17.0)
Immature Granulocytes: 0 %
Lymphocytes Relative: 19 %
Lymphs Abs: 1.4 10*3/uL (ref 0.7–4.0)
MCH: 30.2 pg (ref 26.0–34.0)
MCHC: 33.5 g/dL (ref 30.0–36.0)
MCV: 90.2 fL (ref 80.0–100.0)
Monocytes Absolute: 0.5 10*3/uL (ref 0.1–1.0)
Monocytes Relative: 7 %
Neutro Abs: 5 10*3/uL (ref 1.7–7.7)
Neutrophils Relative %: 72 %
Platelets: 162 10*3/uL (ref 150–400)
RBC: 4.5 MIL/uL (ref 4.22–5.81)
RDW: 12.4 % (ref 11.5–15.5)
WBC: 7 10*3/uL (ref 4.0–10.5)
nRBC: 0 % (ref 0.0–0.2)

## 2022-10-16 LAB — LACTATE DEHYDROGENASE: LDH: 105 U/L (ref 98–192)

## 2022-10-16 NOTE — Progress Notes (Signed)
[place], [place] [address]   CLINIC:  Medical Oncology/Hematology  PCP:  Susy Frizzle, MD [address] [place] [address] [phone]  Virtual Visit Progress Note  I connected with AXL RODINO on 10/16/22 at 10:30 AM EST by video enabled telemedicine visit and verified that I am speaking with the correct person using two identifiers.   I discussed the limitations, risks, security and privacy concerns of performing an evaluation and management service by telemedicine and the availability of in-person appointments. I also discussed with the patient that there may be a patient responsible charge related to this service. The patient expressed understanding and agreed to proceed.   Other persons participating in the visit and their role in the encounter: RN, NP, Patient   Patient's location: [facility]  Provider's location: home  REASON FOR VISIT:  Follow-up for thrombocytopenia  PRIOR THERAPY: None  CURRENT THERAPY: Observation  INTERVAL HISTORY:  Andrew [name] 82 y.o. male returns for routine follow-up of thrombocytopenia.      REVIEW OF SYSTEMS:    Review of Systems  Constitutional:  Negative for appetite change, fatigue and unexpected weight change.  HENT:   Negative for mouth sores, sore throat and trouble swallowing.   Respiratory:  Negative for chest tightness and shortness of breath.   Cardiovascular:  Negative for leg swelling.  Gastrointestinal:  Negative for abdominal pain, constipation, diarrhea, nausea and vomiting.  Genitourinary:  Negative for bladder incontinence and dysuria.   Musculoskeletal:  Negative for flank pain and neck stiffness.  Skin:  Negative for itching, rash and wound.  Neurological:  Negative for dizziness, headaches, light-headedness and numbness.  Hematological:  Negative for adenopathy. Does not bruise/bleed easily.  Psychiatric/Behavioral:  Negative for confusion, depression and sleep  disturbance. The patient is not nervous/anxious.       PAST MEDICAL/SURGICAL HISTORY:  Past Medical History:  Diagnosis Date   Allergy    grass etc.   Barrett's esophagus    BPH (benign prostatic hyperplasia)    Chest pain, atypical    Chronic kidney disease    kidney stones   Coronary artery disease    GERD (gastroesophageal reflux disease)    Barrett's   Hernia    Hyperlipidemia    Hypertension    Myocardial infarction ([place])    Thrombocytopenia ([place])    Ulcer 1960   Past Surgical History:  Procedure Laterality Date   CARDIAC CATHETERIZATION  02/14/2007   MITRAL REGURGITATION. LV NORMAL   CHOLECYSTECTOMY     COLONOSCOPY     CORONARY ARTERY BYPASS GRAFT     twice  2 by passes each time   INGUINAL HERNIA REPAIR     bilateral done twice   LITHOTRIPSY     UPPER GASTROINTESTINAL ENDOSCOPY       SOCIAL HISTORY:  Social History   Socioeconomic History   Marital status: Married    Spouse name: [place]   Number of children: 2   Years of education: Not on file   Highest education level: Not on file  Occupational History   Occupation: Retired  Tobacco Use   Smoking status: Never   Smokeless tobacco: Never  Vaping Use   Vaping Use: Never used  Substance and Sexual Activity   Alcohol use: No   Drug use: No   Sexual activity: Not Currently  Other Topics Concern   Not on file  Social History Narrative   Married in 1961.  6 grandchildren   Social Determinants of Health   Financial Resource Strain: Low Risk  (09/26/2021)   Overall Financial Resource Strain (CARDIA)    Difficulty of Paying Living Expenses: Not hard at all  Food Insecurity: No Food Insecurity (10/16/2022)   Hunger Vital Sign    Worried About Running Out of Food in the Last Year: Never true    Ran Out of Food in the Last Year: Never true  Transportation Needs: No Transportation Needs (10/16/2022)   PRAPARE - Hydrologist (Medical): No    Lack of Transportation  (Non-Medical): No  Physical Activity: Insufficiently Active (09/26/2021)   Exercise Vital Sign    Days of Exercise per Week: 5 days    Minutes of Exercise per Session: 20 min  Stress: No Stress Concern Present (09/26/2021)   [place]    Feeling of Stress : Not at all  Social Connections: [place] (09/26/2021)   Social Connection and Isolation Panel [NHANES]    Frequency of Communication with Friends and Family: Three times a week    Frequency of Social Gatherings with Friends and Family: Three times a week    Attends Religious Services: More than 4 times per year    Active Member of Clubs or Organizations: Yes    Attends Archivist Meetings: More than 4 times per year    Marital Status: Married  Human resources officer Violence: Not At Risk (10/16/2022)   Humiliation, Afraid, Rape, and Kick questionnaire    Fear of Current or Ex-Partner: No    Emotionally Abused: No    Physically Abused: No    Sexually Abused: No    FAMILY HISTORY:  Family History  Problem Relation Age of Onset   Stroke Father    Heart attack Mother    Heart attack Maternal Grandmother    Heart attack Maternal Grandfather    Heart disease Sister    Heart disease Brother    Heart disease Sister    Heart disease Brother    Prostate cancer Brother    Colon cancer Neg Hx    Esophageal cancer Neg Hx    Rectal cancer Neg Hx    Stomach cancer Neg Hx     CURRENT MEDICATIONS:  Outpatient Encounter Medications as of 10/16/2022  Medication Sig   Accu-Chek Softclix Lancets lancets TEST BLOOD SUGAR ONE TIME DAILY   Alcohol Swabs (DROPSAFE ALCOHOL PREP) 70 % PADS USE TOPICALLY ONE TIME DAILY   amLODipine (NORVASC) 5 MG tablet TAKE 1 TABLET(5 MG) BY MOUTH DAILY   aspirin 81 MG tablet Take 81 mg by mouth daily.   augmented betamethasone dipropionate (DIPROLENE-AF) 0.05 % cream Apply topically.   Blood Glucose Monitoring Suppl (ACCU-CHEK  GUIDE ME) w/Device KIT Use as instructed to monitor FSBS 1x daily. Dx: E11.9   Cholecalciferol (VITAMIN D) 2000 units CAPS Take 1 capsule by mouth daily.   finasteride (PROSCAR) 5 MG tablet TAKE 1 TABLET(5 MG) BY MOUTH DAILY.   glucose blood (ACCU-CHEK GUIDE) test strip TEST BLOOD SUGAR ONE TIME DAILY AS DIRECTED   lansoprazole (PREVACID) 30 MG capsule TAKE 1 CAPSULE EVERY DAY   metFORMIN (GLUCOPHAGE-XR) 500 MG 24 hr tablet TAKE 2 TABLETS(1000 MG) BY MOUTH DAILY WITH BREAKFAST   metoprolol succinate (TOPROL-XL) 25 MG 24 hr tablet TAKE 1/2 TABLET EVERY DAY   Multiple Vitamin (MULTIVITAMIN) tablet Take 1 tablet by mouth daily.   nitroGLYCERIN (NITROSTAT) 0.4 MG SL tablet Dissolve  1 tablet under the tongue every 5 minutes as needed for chest pain. Max of 3 doses, then 911.   olopatadine (PATADAY) 0.1 % ophthalmic solution Place 1 drop into both eyes 2 (two) times daily.   Omega-3 Fatty Acids (FISH OIL) 1000 MG CPDR Take 1,000 mg by mouth every morning.   simvastatin (ZOCOR) 10 MG tablet TAKE 1 TABLET AT BEDTIME   tamsulosin (FLOMAX) 0.4 MG CAPS capsule TAKE 1 CAPSULE EVERY DAY   tiZANidine (ZANAFLEX) 2 MG tablet Take 1 tablet (2 mg total) by mouth every 6 (six) hours as needed for muscle spasms.   No facility-administered encounter medications on file as of 10/16/2022.    ALLERGIES:  Allergies  Allergen Reactions   Imdur [Isosorbide Mononitrate]     Unknown, per pt    Meloxicam Swelling    Facial swelling      PHYSICAL EXAM:    ECOG PERFORMANCE STATUS: 1 - Symptomatic but completely ambulatory  There were no vitals filed for this visit. There were no vitals filed for this visit. Physical Exam Vitals reviewed.  Constitutional:      Appearance: He is not ill-appearing.  Pulmonary:     Effort: No respiratory distress.  Skin:    Coloration: Skin is not pale.  Neurological:     Mental Status: He is alert and oriented to person, place, and time.  Psychiatric:        Mood and  Affect: Mood normal.        Behavior: Behavior normal.    LABORATORY DATA:  I have reviewed the labs as listed.  CBC    Component Value Date/Time   WBC 7.0 10/16/2022 0940   RBC 4.50 10/16/2022 0940   HGB 13.6 10/16/2022 0940   HGB 14.4 05/31/2020 0848   HCT 40.6 10/16/2022 0940   HCT 41.9 05/31/2020 0848   PLT 162 10/16/2022 0940   PLT CANCELED 05/31/2020 0848   MCV 90.2 10/16/2022 0940   MCV 87 05/31/2020 0848   MCH 30.2 10/16/2022 0940   MCHC 33.5 10/16/2022 0940   RDW 12.4 10/16/2022 0940   RDW 12.4 05/31/2020 0848   LYMPHSABS 1.4 10/16/2022 0940   MONOABS 0.5 10/16/2022 0940   EOSABS 0.1 10/16/2022 0940   BASOSABS 0.0 10/16/2022 0940      Latest Ref Rng & Units 08/04/2022    8:12 AM 06/02/2022   11:00 AM 04/20/2022    4:41 PM  CMP  Glucose 65 - 99 mg/dL 143  147  141   BUN 7 - 25 mg/dL '19  20  23   '$ Creatinine 0.70 - 1.22 mg/dL 0.81  0.79  0.89   Sodium 135 - 146 mmol/L 140  141  141   Potassium 3.5 - 5.3 mmol/L 4.3  4.1  4.3   Chloride 98 - 110 mmol/L 104  101  105   CO2 20 - 32 mmol/L '28  25  29   '$ Calcium 8.6 - 10.3 mg/dL 9.1  9.3  9.3   Total Protein 6.1 - 8.1 g/dL 6.0   5.9    6.0   Total Bilirubin 0.2 - 1.2 mg/dL 0.5   0.4   AST 10 - 35 U/L 15   12   ALT 9 - 46 U/L '14  13  10     '$ DIAGNOSTIC IMAGING:  I have independently reviewed the relevant imaging and discussed with the patient.  ASSESSMENT & PLAN: 1.  Mild to moderate thrombocytopenia - Followed at our clinic for intermittent  mild to moderate thrombocytopenia - He has had intermittently low platelet count since 2011, ranging from 59 to normal -CTAP on 07/12/2019 shows normal spleen with no hepatic masses.  Normal enlarged lymph nodes. - Nutritional deficiency work-up and connective tissue disorder work-up was negative. - He denies any B symptoms or infections in the last 6 months.   - He has easy bruising over the dorsum of the hands due to thinning of the skin.  No bleeding manifestations.   -  Labs reviewed and are stable. LDH stable as well. Recommend continued surveillance.  - Differential diagnosis includes platelet clumping versus immune mediated thrombocytopenia. - PLAN: Continue surveillance. Recommend follow-up in 6 months with repeat CBC.  All questions were answered. The patient knows to call the clinic with any problems, questions or concerns.  Medical decision making: Low   Return to clinic  -  6 mo- lab (cbc, cmp), Rebekah  I discussed the assessment and treatment plan with the patient. The patient was provided an opportunity to ask questions and all were answered. The patient agreed with the plan and demonstrated an understanding of the instructions.   The patient was advised to call back or seek an in-person evaluation if the symptoms worsen or if the condition fails to improve as anticipated.   I spent 15 minutes face-to-face video visit time dedicated to the care of this patient on the date of this encounter to include pre-visit review of heme/onc notes, labs, imaging, face-to-face time with the patient, and post visit ordering of testing/documentation.   Beckey Rutter, DNP, AGNP-C [place] [phone]

## 2022-10-20 ENCOUNTER — Encounter: Payer: Self-pay | Admitting: Family Medicine

## 2022-10-20 ENCOUNTER — Ambulatory Visit (INDEPENDENT_AMBULATORY_CARE_PROVIDER_SITE_OTHER): Payer: Medicare HMO | Admitting: Family Medicine

## 2022-10-20 VITALS — BP 120/68 | HR 74 | Temp 98.7°F | Ht 68.0 in | Wt 148.0 lb

## 2022-10-20 DIAGNOSIS — I70219 Atherosclerosis of native arteries of extremities with intermittent claudication, unspecified extremity: Secondary | ICD-10-CM | POA: Diagnosis not present

## 2022-10-20 DIAGNOSIS — R634 Abnormal weight loss: Secondary | ICD-10-CM | POA: Diagnosis not present

## 2022-10-20 NOTE — Progress Notes (Signed)
Subjective:    Patient ID: Andrew [name], male    DOB: [birth year], 82 y.o.   MRN: [id number]  Patient is concerned due to a lesion on his left shin that is not healing.  I saw the patient initially last year.  At that time it was the size of a nickel.  Since I last saw him it has enlarged and is now roughly 6 to 7 cm in diameter.  It is a faint erythematous patch with atrophy of the skin.  There is no scaling.  There are small petechial surrounding this.  He has seen dermatology who is try treating him with topical steroids with the presumptive diagnosis of necrobiosis lipoidica.  He is here today requesting a wound clinic referral.  However there is no wound.  This is a rash with no evidence of skin breakdown.  I explained to the patient I do not feel the wound clinic will be able to assist him given the fact there is no wound to heal.  I believe dermatology is the appropriate specialty.  He also reports pain in his left calf with ambulation.  The pain goes away with rest.  He is concerned about claudication and would like an ultrasound to evaluate for any arterial blockages.  Of note his daughter was just diagnosed with a DVT.  Third he is concerned about weight loss.  In 2022 January he weighed 162 pounds.  By January 2023 he weighed 152 pounds.  He has fluctuated around 150 pounds ever since.  In November 2023 he was 151 pounds.  Today he is down to 148 pounds.  I explained to the patient that his weight has been relatively stable over the last year.  I believe that the initial weight loss is likely related to age and muscle loss.  He denies any abdominal pain.  He reports a good appetite. Past Medical History:  Diagnosis Date   Allergy    grass etc.   Barrett's esophagus    BPH (benign prostatic hyperplasia)    Chest pain, atypical    Chronic kidney disease    kidney stones   Coronary artery disease    GERD (gastroesophageal reflux disease)    Barrett's   Hernia    Hyperlipidemia     Hypertension    Myocardial infarction ([place])    Thrombocytopenia ([place])    Ulcer 1960   Past Surgical History:  Procedure Laterality Date   CARDIAC CATHETERIZATION  02/14/2007   MITRAL REGURGITATION. LV NORMAL   CHOLECYSTECTOMY     COLONOSCOPY     CORONARY ARTERY BYPASS GRAFT     twice  2 by passes each time   INGUINAL HERNIA REPAIR     bilateral done twice   LITHOTRIPSY     UPPER GASTROINTESTINAL ENDOSCOPY     Current Outpatient Medications on File Prior to Visit  Medication Sig Dispense Refill   Accu-Chek Softclix Lancets lancets TEST BLOOD SUGAR ONE TIME DAILY 100 each 1   Alcohol Swabs (DROPSAFE ALCOHOL PREP) 70 % PADS USE TOPICALLY ONE TIME DAILY 100 each 1   amLODipine (NORVASC) 5 MG tablet TAKE 1 TABLET(5 MG) BY MOUTH DAILY 90 tablet 3   aspirin 81 MG tablet Take 81 mg by mouth daily.     augmented betamethasone dipropionate (DIPROLENE-AF) 0.05 % cream Apply topically.     Blood Glucose Monitoring Suppl (ACCU-CHEK GUIDE ME) w/Device KIT Use as instructed to monitor FSBS 1x daily. Dx: E11.9 1 kit 1   Cholecalciferol (  VITAMIN D) 2000 units CAPS Take 1 capsule by mouth daily.     clobetasol cream (TEMOVATE) 0.05 % Apply topically.     finasteride (PROSCAR) 5 MG tablet TAKE 1 TABLET(5 MG) BY MOUTH DAILY. 90 tablet 3   glucose blood (ACCU-CHEK GUIDE) test strip TEST BLOOD SUGAR ONE TIME DAILY AS DIRECTED 100 strip 0   lansoprazole (PREVACID) 30 MG capsule TAKE 1 CAPSULE EVERY DAY 90 capsule 1   metFORMIN (GLUCOPHAGE-XR) 500 MG 24 hr tablet TAKE 2 TABLETS(1000 MG) BY MOUTH DAILY WITH BREAKFAST 180 tablet 3   metoprolol succinate (TOPROL-XL) 25 MG 24 hr tablet TAKE 1/2 TABLET EVERY DAY 45 tablet 2   Multiple Vitamin (MULTIVITAMIN) tablet Take 1 tablet by mouth daily.     nitroGLYCERIN (NITROSTAT) 0.4 MG SL tablet Dissolve 1 tablet under the tongue every 5 minutes as needed for chest pain. Max of 3 doses, then 911. 75 tablet 3   olopatadine (PATADAY) 0.1 % ophthalmic solution Place 1  drop into both eyes 2 (two) times daily. 5 mL 12   Omega-3 Fatty Acids (FISH OIL) 1000 MG CPDR Take 1,000 mg by mouth every morning.     simvastatin (ZOCOR) 10 MG tablet TAKE 1 TABLET AT BEDTIME 90 tablet 3   tamsulosin (FLOMAX) 0.4 MG CAPS capsule TAKE 1 CAPSULE EVERY DAY 90 capsule 3   tiZANidine (ZANAFLEX) 2 MG tablet Take 1 tablet (2 mg total) by mouth every 6 (six) hours as needed for muscle spasms. 30 tablet 1   No current facility-administered medications on file prior to visit.   Allergies  Allergen Reactions   Imdur [Isosorbide Mononitrate]     Unknown, per pt    Meloxicam Swelling    Facial swelling    Social History   Socioeconomic History   Marital status: Married    Spouse name: Andrew [name]   Number of children: 2   Years of education: Not on file   Highest education level: Not on file  Occupational History   Occupation: Retired  Tobacco Use   Smoking status: Never   Smokeless tobacco: Never  Vaping Use   Vaping Use: Never used  Substance and Sexual Activity   Alcohol use: No   Drug use: No   Sexual activity: Not Currently  Other Topics Concern   Not on file  Social History Narrative   Married in 1961.   6 grandchildren   Social Determinants of Health   Financial Resource Strain: Low Risk  (09/26/2021)   Overall Financial Resource Strain (CARDIA)    Difficulty of Paying Living Expenses: Not hard at all  Food Insecurity: No Food Insecurity (10/16/2022)   Hunger Vital Sign    Worried About Running Out of Food in the Last Year: Never true    [place] in the Last Year: Never true  Transportation Needs: No Transportation Needs (10/16/2022)   PRAPARE - Hydrologist (Medical): No    Lack of Transportation (Non-Medical): No  Physical Activity: Insufficiently Active (09/26/2021)   Exercise Vital Sign    Days of Exercise per Week: 5 days    Minutes of Exercise per Session: 20 min  Stress: No Stress Concern Present (09/26/2021)    [place]    Feeling of Stress : Not at all  Social Connections: [place] (09/26/2021)   Social Connection and Isolation Panel [NHANES]    Frequency of Communication with Friends and Family: Three times a  week    Frequency of Social Gatherings with Friends and Family: Three times a week    Attends Religious Services: More than 4 times per year    Active Member of Clubs or Organizations: Yes    Attends Archivist Meetings: More than 4 times per year    Marital Status: Married  Human resources officer Violence: Not At Risk (10/16/2022)   Humiliation, Afraid, Rape, and Kick questionnaire    Fear of Current or Ex-Partner: No    Emotionally Abused: No    Physically Abused: No    Sexually Abused: No      Review of Systems  All other systems reviewed and are negative.      Objective:   Physical Exam Vitals reviewed.  Constitutional:      Appearance: Normal appearance.  Cardiovascular:     Rate and Rhythm: Normal rate and regular rhythm.     Heart sounds: Normal heart sounds. No murmur heard.    No friction rub. No gallop.  Pulmonary:     Effort: Pulmonary effort is normal. No respiratory distress.     Breath sounds: Normal breath sounds and air entry. No stridor, decreased air movement or transmitted upper airway sounds. No wheezing, rhonchi or rales.  Musculoskeletal:     Right lower leg: No edema.     Left lower leg: No edema.  Neurological:     Mental Status: He is alert.     7 cm erythematous ring with central clearing      Assessment & Plan:  Extremity atherosclerosis with intermittent claudication (HCC) - Plan: VAS [place] ABI WITH/WO TBI  Weight loss - Plan: Fecal Globin By Immunochemistry There is no evidence of cellulitis.  There is no true skin breakdown.  I recommended the patient continue to follow-up with his dermatologist.  I do not believe wound care will be able to assist him in  this matter.  Regarding his weight loss it has been relatively stable for the last 12 months but I will start by obtaining a stool sample to evaluate for any occult blood that may be a sign of an occult GI malignancy.  I will also obtain a vascular ultrasound to evaluate for any evidence of peripheral vascular disease given his claudication symptoms

## 2022-10-22 DIAGNOSIS — R634 Abnormal weight loss: Secondary | ICD-10-CM | POA: Diagnosis not present

## 2022-10-23 ENCOUNTER — Ambulatory Visit (HOSPITAL_COMMUNITY)
Admission: RE | Admit: 2022-10-23 | Discharge: 2022-10-23 | Disposition: A | Discharge status: Home / Self Care | Payer: Medicare HMO | Source: Ambulatory Visit | Attending: Family Medicine | Admitting: Family Medicine

## 2022-10-23 DIAGNOSIS — I70219 Atherosclerosis of native arteries of extremities with intermittent claudication, unspecified extremity: Secondary | ICD-10-CM | POA: Diagnosis not present

## 2022-10-23 LAB — FECAL GLOBIN BY IMMUNOCHEMISTRY
FECAL GLOBIN RESULT:: NOT DETECTED
MICRO NUMBER:: 14419677
SPECIMEN QUALITY:: ADEQUATE

## 2022-10-24 LAB — VAS US ABI WITH/WO TBI

## 2022-10-28 ENCOUNTER — Other Ambulatory Visit: Payer: Self-pay

## 2022-10-28 DIAGNOSIS — D696 Thrombocytopenia, unspecified: Secondary | ICD-10-CM

## 2022-10-28 DIAGNOSIS — I70219 Atherosclerosis of native arteries of extremities with intermittent claudication, unspecified extremity: Secondary | ICD-10-CM

## 2022-10-28 DIAGNOSIS — I2510 Atherosclerotic heart disease of native coronary artery without angina pectoris: Secondary | ICD-10-CM

## 2022-10-28 NOTE — Progress Notes (Deleted)
Subjective:   Andrew [name] is a 82 y.o. male who presents for Medicare Annual/Subsequent preventive examination.  Review of Systems    ***       Objective:    There were no vitals filed for this visit. There is no height or weight on file to calculate BMI.     10/16/2022   10:14 AM 04/10/2022   10:06 AM 09/26/2021   10:42 AM 09/24/2021    3:04 PM 03/25/2021    2:21 PM 09/17/2020    2:12 PM 08/27/2020    8:28 AM  Advanced Directives  Does Patient Have a Medical Advance Directive? No No No No No No Yes  Type of Transport planner;Living will  Would patient like information on creating a medical advance directive? No - Patient declined No - Patient declined No - Patient declined No - Patient declined No - Patient declined No - Patient declined     Current Medications (verified) Outpatient Encounter Medications as of 10/29/2022  Medication Sig   Accu-Chek Softclix Lancets lancets TEST BLOOD SUGAR ONE TIME DAILY   Alcohol Swabs (DROPSAFE ALCOHOL PREP) 70 % PADS USE TOPICALLY ONE TIME DAILY   amLODipine (NORVASC) 5 MG tablet TAKE 1 TABLET(5 MG) BY MOUTH DAILY   aspirin 81 MG tablet Take 81 mg by mouth daily.   augmented betamethasone dipropionate (DIPROLENE-AF) 0.05 % cream Apply topically.   Blood Glucose Monitoring Suppl (ACCU-CHEK GUIDE ME) w/Device KIT Use as instructed to monitor FSBS 1x daily. Dx: E11.9   Cholecalciferol (VITAMIN D) 2000 units CAPS Take 1 capsule by mouth daily.   clobetasol cream (TEMOVATE) 0.05 % Apply topically.   finasteride (PROSCAR) 5 MG tablet TAKE 1 TABLET(5 MG) BY MOUTH DAILY.   glucose blood (ACCU-CHEK GUIDE) test strip TEST BLOOD SUGAR ONE TIME DAILY AS DIRECTED   lansoprazole (PREVACID) 30 MG capsule TAKE 1 CAPSULE EVERY DAY   metFORMIN (GLUCOPHAGE-XR) 500 MG 24 hr tablet TAKE 2 TABLETS(1000 MG) BY MOUTH DAILY WITH BREAKFAST   metoprolol succinate (TOPROL-XL) 25 MG 24 hr tablet TAKE 1/2 TABLET EVERY DAY    Multiple Vitamin (MULTIVITAMIN) tablet Take 1 tablet by mouth daily.   nitroGLYCERIN (NITROSTAT) 0.4 MG SL tablet Dissolve 1 tablet under the tongue every 5 minutes as needed for chest pain. Max of 3 doses, then 911.   olopatadine (PATADAY) 0.1 % ophthalmic solution Place 1 drop into both eyes 2 (two) times daily.   Omega-3 Fatty Acids (FISH OIL) 1000 MG CPDR Take 1,000 mg by mouth every morning.   simvastatin (ZOCOR) 10 MG tablet TAKE 1 TABLET AT BEDTIME   tamsulosin (FLOMAX) 0.4 MG CAPS capsule TAKE 1 CAPSULE EVERY DAY   tiZANidine (ZANAFLEX) 2 MG tablet Take 1 tablet (2 mg total) by mouth every 6 (six) hours as needed for muscle spasms.   No facility-administered encounter medications on file as of 10/29/2022.    Allergies (verified) Imdur [isosorbide mononitrate] and Meloxicam   History: Past Medical History:  Diagnosis Date   Allergy    grass etc.   Barrett's esophagus    BPH (benign prostatic hyperplasia)    Chest pain, atypical    Chronic kidney disease    kidney stones   Coronary artery disease    GERD (gastroesophageal reflux disease)    Barrett's   Hernia    Hyperlipidemia    Hypertension    Myocardial infarction (HCC)    Thrombocytopenia (HCC)    Ulcer  1960   Past Surgical History:  Procedure Laterality Date   CARDIAC CATHETERIZATION  02/14/2007   MITRAL REGURGITATION. LV NORMAL   CHOLECYSTECTOMY     COLONOSCOPY     CORONARY ARTERY BYPASS GRAFT     twice  2 by passes each time   INGUINAL HERNIA REPAIR     bilateral done twice   LITHOTRIPSY     UPPER GASTROINTESTINAL ENDOSCOPY     Family History  Problem Relation Age of Onset   Stroke Father    Heart attack Mother    Heart attack Maternal Grandmother    Heart attack Maternal Grandfather    Heart disease Sister    Heart disease Brother    Heart disease Sister    Heart disease Brother    Prostate cancer Brother    Colon cancer Neg Hx    Esophageal cancer Neg Hx    Rectal cancer Neg Hx    Stomach  cancer Neg Hx    Social History   Socioeconomic History   Marital status: Married    Spouse name: Andrew [name]   Number of children: 2   Years of education: Not on file   Highest education level: Not on file  Occupational History   Occupation: Retired  Tobacco Use   Smoking status: Never   Smokeless tobacco: Never  Vaping Use   Vaping Use: Never used  Substance and Sexual Activity   Alcohol use: No   Drug use: No   Sexual activity: Not Currently  Other Topics Concern   Not on file  Social History Narrative   Married in 1961.   6 grandchildren   Social Determinants of Health   Financial Resource Strain: Low Risk  (09/26/2021)   Overall Financial Resource Strain (CARDIA)    Difficulty of Paying Living Expenses: Not hard at all  Food Insecurity: No Food Insecurity (10/16/2022)   Hunger Vital Sign    Worried About Running Out of Food in the Last Year: Never true    [place] in the Last Year: Never true  Transportation Needs: No Transportation Needs (10/16/2022)   PRAPARE - Hydrologist (Medical): No    Lack of Transportation (Non-Medical): No  Physical Activity: Insufficiently Active (09/26/2021)   Exercise Vital Sign    Days of Exercise per Week: 5 days    Minutes of Exercise per Session: 20 min  Stress: No Stress Concern Present (09/26/2021)   [place]    Feeling of Stress : Not at all  Social Connections: [place] (09/26/2021)   Social Connection and Isolation Panel [NHANES]    Frequency of Communication with Friends and Family: Three times a week    Frequency of Social Gatherings with Friends and Family: Three times a week    Attends Religious Services: More than 4 times per year    Active Member of Clubs or Organizations: Yes    Attends Archivist Meetings: More than 4 times per year    Marital Status: Married    Tobacco Counseling Counseling  given: Not Answered   Clinical Intake:                 Diabetic?No          Activities of Daily Living     No data to display          Patient Care Team: Susy Frizzle, MD as PCP - General (Family Medicine)  Nahser, Wonda Cheng, MD as PCP - Cardiology (Cardiology) Edythe Clarity, [facility] as Pharmacist (Pharmacist) Edythe Clarity, [facility] (Pharmacist)  Indicate any recent Medical Services you may have received from other than [facility] providers in the past year (date may be approximate).     Assessment:   This is a routine wellness examination for Andrew [name].  Hearing/Vision screen No results found.  Dietary issues and exercise activities discussed:     Goals Addressed   None    Depression Screen    10/16/2022   10:36 AM 08/24/2022    2:19 PM 08/07/2022    9:05 AM 10/10/2021   12:20 PM 09/26/2021   10:38 AM 08/27/2020    8:28 AM 02/15/2020    8:04 AM  PHQ 2/9 Scores  PHQ - 2 Score 0 0 0 0 0 0 0  PHQ- 9 Score    4       Fall Risk    08/07/2022    9:05 AM 09/26/2021   10:42 AM 01/10/2021   12:02 PM 08/27/2020    8:28 AM 02/15/2020    8:04 AM  [place] in the past year? 0 0 0 0 0  Number falls in past yr: 0 0 0 0   Injury with Fall? 0 0 0 0   Risk for fall due to : No Fall Risks No Fall Risks     Follow up Falls prevention discussed Falls prevention discussed   Falls evaluation completed    FALL RISK PREVENTION PERTAINING TO THE HOME:  Any stairs in or around the home? {YES/NO:21197} If so, are there any without handrails? {YES/NO:21197} Home free of loose throw rugs in walkways, pet beds, electrical cords, etc? {YES/NO:21197} Adequate lighting in your home to reduce risk of falls? {YES/NO:21197}  ASSISTIVE DEVICES UTILIZED TO PREVENT FALLS:  Life alert? {YES/NO:21197} Use of a cane, walker or w/c? {YES/NO:21197} Grab bars in the bathroom? {YES/NO:21197} Shower chair or bench in shower? {YES/NO:21197} Elevated toilet seat or a  handicapped toilet? {YES/NO:21197}  TIMED UP AND GO:  Was the test performed? {YES/NO:21197}.  Length of time to ambulate 10 feet: *** sec.   {Appearance of YN:[id number]  Cognitive Function:        08/27/2020    8:29 AM  6CIT Screen  What Year? 0 points  What month? 0 points  What time? 0 points  Count back from 20 0 points  Months in reverse 0 points  Repeat phrase 0 points  Total Score 0 points    Immunizations Immunization History  Administered Date(s) Administered   Fluad Quad(high Dose 65+) 06/30/2019, 06/18/2020, 08/07/2021, 09/15/2022   Influenza Split 07/07/2012   Influenza Whole 07/18/2010   Influenza, High Dose Seasonal PF 08/16/2017, 09/07/2018   Influenza,inj,Quad PF,6+ Mos 07/24/2013, 09/10/2014, 08/30/2015, 08/21/2016   Moderna Sars-Covid-2 Vaccination 12/20/2019, 01/17/2020, 10/18/2020   Pneumococcal Conjugate-13 09/10/2014   Pneumococcal Polysaccharide-23 08/16/2007   Td 07/30/2003   Zoster, Live 06/11/2006    TDAP status: Due, Education has been provided regarding the importance of this vaccine. Advised may receive this vaccine at local pharmacy or Health Dept. Aware to provide a copy of the vaccination record if obtained from local pharmacy or Health Dept. Verbalized acceptance and understanding.  Flu Vaccine status: Up to date  Pneumococcal vaccine status: Up to date  Covid-19 vaccine status: Information provided on how to obtain vaccines.   Qualifies for Shingles Vaccine? Yes   Zostavax completed Yes   Shingrix Completed?: No.  Education has been provided regarding the importance of this vaccine. Patient has been advised to call insurance company to determine out of pocket expense if they have not yet received this vaccine. Advised may also receive vaccine at local pharmacy or Health Dept. Verbalized acceptance and understanding.  Screening Tests Health Maintenance  Topic Date Due   Medicare Annual Wellness (AWV)  11/20/2022 (Originally  10/10/2022)   COVID-19 Vaccine (4 - 2023-24 season) 12/11/2022 (Originally 06/12/2022)   Diabetic kidney evaluation - Urine ACR  01/11/2023 (Originally 12/21/1958)   Zoster Vaccines- Shingrix (1 of 2) 01/19/2023 (Originally 12/21/1959)   Diabetic kidney evaluation - eGFR measurement  08/05/2023   Pneumonia Vaccine 50+ Years old  Completed   INFLUENZA VACCINE  Completed   HPV VACCINES  Aged Out   DTaP/Tdap/Td  Discontinued   COLONOSCOPY (Pts 45-85yr Insurance coverage will need to be confirmed)  Discontinued    Health Maintenance  There are no preventive care reminders to display for this patient.  Colorectal cancer screening: No longer required.   Lung Cancer Screening: (Low Dose CT Chest recommended if Age 84-80years, 30 pack-year currently smoking OR have quit w/in 15years.) does not qualify.   Lung Cancer Screening Referral: n/a   Additional Screening:  Hepatitis C Screening: does not qualify  Vision Screening: Recommended annual ophthalmology exams for early detection of glaucoma and other disorders of the eye. Is the patient up to date with their annual eye exam?  {YES/NO:21197} Who is the provider or what is the name of the office in which the patient attends annual eye exams? *** If pt is not established with a provider, would they like to be referred to a provider to establish care? {YES/NO:21197}.   Dental Screening: Recommended annual dental exams for proper oral hygiene  Community Resource Referral / Chronic Care Management: CRR required this visit?  {YES/NO:21197}  CCM required this visit?  {YES/NO:21197}     Plan:     I have personally reviewed and noted the following in the patient's chart:   Medical and social history Use of alcohol, tobacco or illicit drugs  Current medications and supplements including opioid prescriptions. {Opioid Prescriptions:[phone]} Functional ability and status Nutritional status Physical activity Advanced directives List of  other physicians Hospitalizations, surgeries, and ER visits in previous 12 months Vitals Screenings to include cognitive, depression, and falls Referrals and appointments  In addition, I have reviewed and discussed with patient certain preventive protocols, quality metrics, and best practice recommendations. A written personalized care plan for preventive services as well as general preventive health recommendations were provided to patient.     SDenman GeorgeBCentennial LWy[place]  [id number]  Nurse Notes: ***

## 2022-10-28 NOTE — Patient Instructions (Incomplete)
[name] , Thank you for taking time to come for your Medicare Wellness Visit. I appreciate your ongoing commitment to your health goals. Please review the following plan we discussed and let me know if I can assist you in the future.   These are the goals we discussed:  Goals      Have 3 meals a day     Pharmacy Care Plan:     CARE PLAN ENTRY (see longitudinal plan of care for additional care plan information)  Current Barriers:  Chronic Disease Management support, education, and care coordination needs related to Hypertension, Hyperlipidemia, and Pre-DM   Hypertension BP Readings from Last 3 Encounters:  06/18/20 130/70  06/04/20 128/64  03/15/20 126/69  Pharmacist Clinical Goal(s): Over the next 180 days, patient will work with PharmD and providers to maintain BP goal <130/80 Current regimen:  Metoprolol XL '25mg'$  one-half tablet daily Interventions: Reviewed most recent BP Patient self care activities - Over the next 180 days, patient will: Check BP periodically, document, and provide at future appointments Ensure daily salt intake < 2300 mg/day  Hyperlipidemia Lab Results  Component Value Date/Time   LDLCALC 58 05/31/2020 08:48 AM   LDLCALC 55 02/08/2020 08:02 AM  Pharmacist Clinical Goal(s): Over the next 180 days, patient will work with PharmD and providers to maintain LDL goal < 70 Current regimen:  Simvastatin '10mg'$  Interventions: Discussed goals for total cholesterol < 200 and LDL < 70 Counseled on benefits of statin medications Patient self care activities - Over the next 180 days, patient will: Continue to focus on medication adherence  Notify providers of any new or worsening muscle pains  Diabetes Lab Results  Component Value Date/Time   HGBA1C 7.2 (H) 05/31/2020 08:48 AM   HGBA1C 6.6 (H) 02/08/2020 08:02 AM  Pharmacist Clinical Goal(s): Over the next 180 days, patient will work with PharmD and providers to achieve A1c goal <7% Current regimen:  No  medications Interventions: Reviewed most recent A1c Discussed diabetic diet and limiting carbs to one source per meal Patient self care activities - Over the next 180 days, patient will: Check blood sugar at MD visit, document, and provide at future appointments Continue to work on dietary modifications limiting carbohydrates and sweets  Initial goal documentation      Track and Manage My Blood Pressure-Hypertension     Timeframe:  Long-Range Goal Priority:  High Start Date:      02/06/21                       Expected End Date:   08/08/21                    Follow Up Date 05/08/21   - check blood pressure 3 times per week - choose a place to take my blood pressure (home, clinic or office, retail store) - write blood pressure results in a log or diary    Why is this important?   You won't feel high blood pressure, but it can still hurt your blood vessels.  High blood pressure can cause heart or kidney problems. It can also cause a stroke.  Making lifestyle changes like losing a little weight or eating less salt will help.  Checking your blood pressure at home and at different times of the day can help to control blood pressure.  If the doctor prescribes medicine remember to take it the way the doctor ordered.  Call the office if you cannot afford  the medicine or if there are questions about it.     Notes:         This is a list of the screening recommended for you and due dates:  Health Maintenance  Topic Date Due   Medicare Annual Wellness Visit  11/20/2022*   COVID-19 Vaccine (4 - 2023-24 season) 12/11/2022*   Yearly kidney health urinalysis for diabetes  01/11/2023*   Zoster (Shingles) Vaccine (1 of 2) 01/19/2023*   Yearly kidney function blood test for diabetes  08/05/2023   Pneumonia Vaccine  Completed   Flu Shot  Completed   HPV Vaccine  Aged Out   DTaP/Tdap/Td vaccine  Discontinued   Colon Cancer Screening  Discontinued  *Topic was postponed. The date shown is  not the original due date.    Advanced directives: ***  Conditions/risks identified: Aim for 30 minutes of exercise or brisk walking, 6-8 glasses of water, and 5 servings of fruits and vegetables each day.   Next appointment: Follow up in one year for your annual wellness visit.   Preventive Care 35 Years and Older, Male  Preventive care refers to lifestyle choices and visits with your health care provider that can promote health and wellness. What does preventive care include? A yearly physical exam. This is also called an annual well check. Dental exams once or twice a year. Routine eye exams. Ask your health care provider how often you should have your eyes checked. Personal lifestyle choices, including: Daily care of your teeth and gums. Regular physical activity. Eating a healthy diet. Avoiding tobacco and drug use. Limiting alcohol use. Practicing safe sex. Taking low doses of aspirin every day. Taking vitamin and mineral supplements as recommended by your health care provider. What happens during an annual well check? The services and screenings done by your health care provider during your annual well check will depend on your age, overall health, lifestyle risk factors, and family history of disease. Counseling  Your health care provider may ask you questions about your: Alcohol use. Tobacco use. Drug use. Emotional well-being. Home and relationship well-being. Sexual activity. Eating habits. History of falls. Memory and ability to understand (cognition). Work and work Statistician. Screening  You may have the following tests or measurements: Height, weight, and BMI. Blood pressure. Lipid and cholesterol levels. These may be checked every 5 years, or more frequently if you are over 65 years old. Skin check. Lung cancer screening. You may have this screening every year starting at age 15 if you have a 30-pack-year history of smoking and currently smoke or have quit  within the past 15 years. Fecal occult blood test (FOBT) of the stool. You may have this test every year starting at age 12. Flexible sigmoidoscopy or colonoscopy. You may have a sigmoidoscopy every 5 years or a colonoscopy every 10 years starting at age 61. Prostate cancer screening. Recommendations will vary depending on your family history and other risks. Hepatitis C blood test. Hepatitis B blood test. Sexually transmitted disease (STD) testing. Diabetes screening. This is done by checking your blood sugar (glucose) after you have not eaten for a while (fasting). You may have this done every 1-3 years. Abdominal aortic aneurysm (AAA) screening. You may need this if you are a current or former smoker. Osteoporosis. You may be screened starting at age 71 if you are at high risk. Talk with your health care provider about your test results, treatment options, and if necessary, the need for more tests. Vaccines  Your health  care provider may recommend certain vaccines, such as: Influenza vaccine. This is recommended every year. Tetanus, diphtheria, and acellular pertussis (Tdap, Td) vaccine. You may need a Td booster every 10 years. Zoster vaccine. You may need this after age 81. Pneumococcal 13-valent conjugate (PCV13) vaccine. One dose is recommended after age 19. Pneumococcal polysaccharide (PPSV23) vaccine. One dose is recommended after age 58. Talk to your health care provider about which screenings and vaccines you need and how often you need them. This information is not intended to replace advice given to you by your health care provider. Make sure you discuss any questions you have with your health care provider. Document Released: 10/25/2015 Document Revised: 06/17/2016 Document Reviewed: 07/30/2015 Elsevier Interactive Patient Education  2017 [place] Prevention in the Home Falls can cause injuries. They can happen to people of all ages. There are many things you can do  to make your home safe and to help prevent falls. What can I do on the outside of my home? Regularly fix the edges of walkways and driveways and fix any cracks. Remove anything that might make you trip as you walk through a door, such as a raised step or threshold. Trim any bushes or trees on the path to your home. Use bright outdoor lighting. Clear any walking paths of anything that might make someone trip, such as rocks or tools. Regularly check to see if handrails are loose or broken. Make sure that both sides of any steps have handrails. Any raised decks and porches should have guardrails on the edges. Have any leaves, snow, or ice cleared regularly. Use sand or salt on walking paths during winter. Clean up any spills in your garage right away. This includes oil or grease spills. What can I do in the bathroom? Use night lights. Install grab bars by the toilet and in the tub and shower. Do not use towel bars as grab bars. Use non-skid mats or decals in the tub or shower. If you need to sit down in the shower, use a plastic, non-slip stool. Keep the floor dry. Clean up any water that spills on the floor as soon as it happens. Remove soap buildup in the tub or shower regularly. Attach bath mats securely with double-sided non-slip rug tape. Do not have throw rugs and other things on the floor that can make you trip. What can I do in the bedroom? Use night lights. Make sure that you have a light by your bed that is easy to reach. Do not use any sheets or blankets that are too big for your bed. They should not hang down onto the floor. Have a firm chair that has side arms. You can use this for support while you get dressed. Do not have throw rugs and other things on the floor that can make you trip. What can I do in the kitchen? Clean up any spills right away. Avoid walking on wet floors. Keep items that you use a lot in easy-to-reach places. If you need to reach something above you, use  a strong step stool that has a grab bar. Keep electrical cords out of the way. Do not use floor polish or wax that makes floors slippery. If you must use wax, use non-skid floor wax. Do not have throw rugs and other things on the floor that can make you trip. What can I do with my stairs? Do not leave any items on the stairs. Make sure that there are handrails on  both sides of the stairs and use them. Fix handrails that are broken or loose. Make sure that handrails are as long as the stairways. Check any carpeting to make sure that it is firmly attached to the stairs. Fix any carpet that is loose or worn. Avoid having throw rugs at the top or bottom of the stairs. If you do have throw rugs, attach them to the floor with carpet tape. Make sure that you have a light switch at the top of the stairs and the bottom of the stairs. If you do not have them, ask someone to add them for you. What else can I do to help prevent falls? Wear shoes that: Do not have high heels. Have rubber bottoms. Are comfortable and fit you well. Are closed at the toe. Do not wear sandals. If you use a stepladder: Make sure that it is fully opened. Do not climb a closed stepladder. Make sure that both sides of the stepladder are locked into place. Ask someone to hold it for you, if possible. Clearly mark and make sure that you can see: Any grab bars or handrails. First and last steps. Where the edge of each step is. Use tools that help you move around (mobility aids) if they are needed. These include: Canes. Walkers. Scooters. Crutches. Turn on the lights when you go into a dark area. Replace any light bulbs as soon as they burn out. Set up your furniture so you have a clear path. Avoid moving your furniture around. If any of your floors are uneven, fix them. If there are any pets around you, be aware of where they are. Review your medicines with your doctor. Some medicines can make you feel dizzy. This can  increase your chance of falling. Ask your doctor what other things that you can do to help prevent falls. This information is not intended to replace advice given to you by your health care provider. Make sure you discuss any questions you have with your health care provider. Document Released: 07/25/2009 Document Revised: 03/05/2016 Document Reviewed: 11/02/2014 Elsevier Interactive Patient Education  2017 [employer].

## 2022-11-04 ENCOUNTER — Ambulatory Visit: Payer: Medicare HMO | Admitting: Vascular Surgery

## 2022-11-04 ENCOUNTER — Encounter: Payer: Self-pay | Admitting: Vascular Surgery

## 2022-11-04 ENCOUNTER — Other Ambulatory Visit: Payer: Self-pay | Admitting: Family Medicine

## 2022-11-04 VITALS — BP 114/70 | HR 69 | Temp 98.2°F | Ht 68.0 in | Wt 147.0 lb

## 2022-11-04 DIAGNOSIS — M25561 Pain in right knee: Secondary | ICD-10-CM | POA: Diagnosis not present

## 2022-11-04 DIAGNOSIS — M25562 Pain in left knee: Secondary | ICD-10-CM

## 2022-11-04 NOTE — Progress Notes (Signed)
[facility] [place]  Patient name: Andrew [name] MRN: [id number] DOB: [birth year] Sex: male  REASON FOR CONSULT: Evaluation lower extremity discomfort  HPI: Andrew [name] is a 82 y.o. male, who is in today for evaluation of lower extremity discomfort.  He reports that he has pain at night walks and this is relieved .  He denies any true nocturnal leg cramps.  He reports that he may have had his once over the last 12 months.  He reports that he walks around a bit and pain is relieved his legs.  He does have an area on the left pretibial calf where he has had a persistent area of rash.  He has had several diagnosis associated with this and several treatments and this has persisted.  He does report some discomfort around this area as well.  He has no history of lower extremity tissue loss.  He does report weight loss which she attributes to having a COVID-vaccine.  He specifically denies any post prandial abdominal pain.  Past Medical History:  Diagnosis Date   Allergy    grass etc.   Barrett's esophagus    BPH (benign prostatic hyperplasia)    Chest pain, atypical    Chronic kidney disease    kidney stones   Coronary artery disease    GERD (gastroesophageal reflux disease)    Barrett's   Hernia    Hyperlipidemia    Hypertension    Myocardial infarction ([place])    Thrombocytopenia ([place])    Ulcer 1960    Family History  Problem Relation Age of Onset   Stroke Father    Heart attack Mother    Heart attack Maternal Grandmother    Heart attack Maternal Grandfather    Heart disease Sister    Heart disease Brother    Heart disease Sister    Heart disease Brother    Prostate cancer Brother    Colon cancer Neg Hx    Esophageal cancer Neg Hx    Rectal cancer Neg Hx    Stomach cancer Neg Hx     SOCIAL HISTORY: Social History   Socioeconomic History   Marital status: Married    Spouse name: [place]   Number of children: 2    Years of education: Not on file   Highest education level: Not on file  Occupational History   Occupation: Retired  Tobacco Use   Smoking status: Never   Smokeless tobacco: Never  Vaping Use   Vaping Use: Never used  Substance and Sexual Activity   Alcohol use: No   Drug use: No   Sexual activity: Not Currently  Other Topics Concern   Not on file  Social History Narrative   Married in 1961.   6 grandchildren   Social Determinants of Health   Financial Resource Strain: Low Risk  (09/26/2021)   Overall Financial Resource Strain (CARDIA)    Difficulty of Paying Living Expenses: Not hard at all  Food Insecurity: No Food Insecurity (10/16/2022)   Hunger Vital Sign    Worried About Running Out of Food in the Last Year: Never true    [place] in the Last Year: Never true  Transportation Needs: No Transportation Needs (10/16/2022)   PRAPARE - Hydrologist (Medical): No    Lack of Transportation (Non-Medical): No  Physical Activity: Insufficiently Active (09/26/2021)   Exercise Vital Sign    Days of Exercise per Week: 5 days  Minutes of Exercise per Session: 20 min  Stress: No Stress Concern Present (09/26/2021)   [place]    Feeling of Stress : Not at all  Social Connections: [place] (09/26/2021)   Social Connection and Isolation Panel [NHANES]    Frequency of Communication with Friends and Family: Three times a week    Frequency of Social Gatherings with Friends and Family: Three times a week    Attends Religious Services: More than 4 times per year    Active Member of Clubs or Organizations: Yes    Attends Archivist Meetings: More than 4 times per year    Marital Status: Married  Human resources officer Violence: Not At Risk (10/16/2022)   Humiliation, Afraid, Rape, and Kick questionnaire    Fear of Current or Ex-Partner: No    Emotionally Abused: No     Physically Abused: No    Sexually Abused: No    Allergies  Allergen Reactions   Imdur [Isosorbide Mononitrate]     Unknown, per pt    Meloxicam Swelling    Facial swelling     Current Outpatient Medications  Medication Sig Dispense Refill   Accu-Chek Softclix Lancets lancets TEST BLOOD SUGAR ONE TIME DAILY 100 each 1   Alcohol Swabs (DROPSAFE ALCOHOL PREP) 70 % PADS USE TOPICALLY ONE TIME DAILY 100 each 1   amLODipine (NORVASC) 5 MG tablet TAKE 1 TABLET(5 MG) BY MOUTH DAILY 90 tablet 3   aspirin 81 MG tablet Take 81 mg by mouth daily.     augmented betamethasone dipropionate (DIPROLENE-AF) 0.05 % cream Apply topically.     Blood Glucose Monitoring Suppl (ACCU-CHEK GUIDE ME) w/Device KIT Use as instructed to monitor FSBS 1x daily. Dx: E11.9 1 kit 1   Cholecalciferol (VITAMIN D) 2000 units CAPS Take 1 capsule by mouth daily.     clobetasol cream (TEMOVATE) 0.05 % Apply topically.     finasteride (PROSCAR) 5 MG tablet TAKE 1 TABLET(5 MG) BY MOUTH DAILY. 90 tablet 3   glucose blood (ACCU-CHEK GUIDE) test strip TEST BLOOD SUGAR ONE TIME DAILY AS DIRECTED 100 strip 0   lansoprazole (PREVACID) 30 MG capsule TAKE 1 CAPSULE EVERY DAY 90 capsule 1   metFORMIN (GLUCOPHAGE-XR) 500 MG 24 hr tablet TAKE 2 TABLETS EVERY DAY WITH BREAKFAST 180 tablet 3   metoprolol succinate (TOPROL-XL) 25 MG 24 hr tablet TAKE 1/2 TABLET EVERY DAY 45 tablet 2   Multiple Vitamin (MULTIVITAMIN) tablet Take 1 tablet by mouth daily.     nitroGLYCERIN (NITROSTAT) 0.4 MG SL tablet Dissolve 1 tablet under the tongue every 5 minutes as needed for chest pain. Max of 3 doses, then 911. 75 tablet 3   olopatadine (PATADAY) 0.1 % ophthalmic solution Place 1 drop into both eyes 2 (two) times daily. 5 mL 12   Omega-3 Fatty Acids (FISH OIL) 1000 MG CPDR Take 1,000 mg by mouth every morning.     simvastatin (ZOCOR) 10 MG tablet TAKE 1 TABLET AT BEDTIME 90 tablet 3   tamsulosin (FLOMAX) 0.4 MG CAPS capsule TAKE 1 CAPSULE EVERY DAY  90 capsule 3   tiZANidine (ZANAFLEX) 2 MG tablet Take 1 tablet (2 mg total) by mouth every 6 (six) hours as needed for muscle spasms. 30 tablet 1   No current facility-administered medications for this visit.    REVIEW OF SYSTEMS:  '[X]'$  denotes positive finding, '[ ]'$  denotes negative finding Cardiac  Comments:  Chest pain or chest pressure:  Shortness of breath upon exertion:    Short of breath when lying flat:    Irregular heart rhythm:        Vascular    Pain in calf, thigh, or hip brought on by ambulation: x   Pain in feet at night that wakes you up from your sleep:     Blood clot in your veins:    Leg swelling:         Pulmonary    Oxygen at home:    Productive cough:     Wheezing:         Neurologic    Sudden weakness in arms or legs:     Sudden numbness in arms or legs:     Sudden onset of difficulty speaking or slurred speech:    Temporary loss of vision in one eye:     Problems with dizziness:         Gastrointestinal    Blood in stool:     Vomited blood:         Genitourinary    Burning when urinating:     Blood in urine:        Psychiatric    Major depression:         Hematologic    Bleeding problems:    Problems with blood clotting too easily:        Skin    Rashes or ulcers: x       Constitutional    Fever or chills:      PHYSICAL EXAM: Vitals:   11/04/22 0940  BP: 114/70  Pulse: 69  Temp: 98.2 F (36.8 C)  SpO2: 98%  Weight: 147 lb (66.7 kg)  Height: '5\' 8"'$  (1.727 m)    GENERAL: The patient is a well-nourished male, in no acute distress. The vital signs are documented above. CARDIOVASCULAR: 2+ radial and 2+ dorsalis pedis pulses bilaterally PULMONARY: There is good air exchange  MUSCULOSKELETAL: There are no major deformities or cyanosis. NEUROLOGIC: No focal weakness or paresthesias are detected. SKIN: There are no ulcers or rashes noted. PSYCHIATRIC: The patient has a normal affect.  DATA:  Noninvasive studies from 10/23/2022  were reviewed with the patient.  This reveals normal ankle arm index and normal triphasic waveforms in his feet bilaterally  I did review an old CT scan from 07/12/2019.  This showed no evidence of aortic iliac occlusive disease.  He did have moderate calcification at the origin of his celiac, SMA and inferior mesenteric artery.  MEDICAL ISSUES: I reassured him that he has no evidence of arterial or venous pathology to account for his lower extremity discomfort.  He does have weight loss.  His symptoms are not consistent with mesenteric ischemia.  He specifically does not have any postprandial pain.  I would not recommend any further vascular workup regarding this.  He was reassured with our discussion and will see [place] again on an as-needed basis   Rosetta Posner, MD [facility] [phone] Pager [phone]  Note: Portions of this report may have been transcribed using voice recognition software.  Every effort has been made to ensure accuracy; however, inadvertent computerized transcription errors may still be present.

## 2022-11-12 DIAGNOSIS — L921 Necrobiosis lipoidica, not elsewhere classified: Secondary | ICD-10-CM | POA: Diagnosis not present

## 2022-11-16 ENCOUNTER — Telehealth: Payer: Self-pay | Admitting: Family Medicine

## 2022-11-16 NOTE — Telephone Encounter (Signed)
Left message for patient to call back and schedule Medicare Annual Wellness Visit (AWV) in office.   If not able to come in office, please offer to do virtually or by telephone.   Last AWV:: 09/26/2021   Please schedule at any time with [facility]-Nurse Health Advisor.  30 minute appointment  Any questions, please contact me at [phone]   Thank you,   [facility]  Ambulatory Clinical Support for [place] Are. We Are. [facility] ??[phone] or ??[phone]

## 2022-11-19 ENCOUNTER — Ambulatory Visit (INDEPENDENT_AMBULATORY_CARE_PROVIDER_SITE_OTHER): Payer: Medicare HMO | Admitting: Family Medicine

## 2022-11-19 ENCOUNTER — Encounter: Payer: Self-pay | Admitting: Family Medicine

## 2022-11-19 VITALS — BP 136/72 | HR 72 | Temp 98.9°F | Ht 68.0 in | Wt 146.0 lb

## 2022-11-19 DIAGNOSIS — M5432 Sciatica, left side: Secondary | ICD-10-CM | POA: Diagnosis not present

## 2022-11-19 DIAGNOSIS — M5136 Other intervertebral disc degeneration, lumbar region: Secondary | ICD-10-CM

## 2022-11-19 NOTE — Progress Notes (Signed)
Subjective:    Patient ID: Andrew [name], male    DOB: 03/31/1941, 82 y.o.   MRN: [id number]  Patient has been dealing with left-sided leg pain now for several months.  Initially I was concerned about claudication.  We did a vascular study we were unable to measure ABIs due to noncompressibility of the arteries in his legs.  Therefore I consulted vascular surgery and they did not feel that his leg pain was due to any ischemia.  He continues to report an aching throbbing pain in his left calf.  It is worse at night when he lays down.  He also reports numbness occasionally in his left leg.  He had an x-ray of the lumbar spine that showed 6 mm of anterior listhesis of L2 on L3 with possible neuroforaminal impingement.  This was performed in 2022.  He also has degenerative disc disease in the lumbar spine.  Therefore I am starting to be concerned that this may be atypical lumbar radiculopathy Past Medical History:  Diagnosis Date   Allergy    grass etc.   Barrett's esophagus    BPH (benign prostatic hyperplasia)    Chest pain, atypical    Chronic kidney disease    kidney stones   Coronary artery disease    GERD (gastroesophageal reflux disease)    Barrett's   Hernia    Hyperlipidemia    Hypertension    Myocardial infarction ([place])    Thrombocytopenia ([place])    Ulcer 1960   Past Surgical History:  Procedure Laterality Date   CARDIAC CATHETERIZATION  02/14/2007   MITRAL REGURGITATION. LV NORMAL   CHOLECYSTECTOMY     COLONOSCOPY     CORONARY ARTERY BYPASS GRAFT     twice  2 by passes each time   INGUINAL HERNIA REPAIR     bilateral done twice   LITHOTRIPSY     UPPER GASTROINTESTINAL ENDOSCOPY     Current Outpatient Medications on File Prior to Visit  Medication Sig Dispense Refill   Accu-Chek Softclix Lancets lancets TEST BLOOD SUGAR ONE TIME DAILY 100 each 1   Alcohol Swabs (DROPSAFE ALCOHOL PREP) 70 % PADS USE TOPICALLY ONE TIME DAILY 100 each 1   amLODipine (NORVASC) 5 MG tablet  TAKE 1 TABLET(5 MG) BY MOUTH DAILY 90 tablet 3   aspirin 81 MG tablet Take 81 mg by mouth daily.     augmented betamethasone dipropionate (DIPROLENE-AF) 0.05 % cream Apply topically.     Blood Glucose Monitoring Suppl (ACCU-CHEK GUIDE ME) w/Device KIT Use as instructed to monitor FSBS 1x daily. Dx: E11.9 1 kit 1   Cholecalciferol (VITAMIN D) 2000 units CAPS Take 1 capsule by mouth daily.     clobetasol cream (TEMOVATE) 0.05 % Apply topically.     finasteride (PROSCAR) 5 MG tablet TAKE 1 TABLET(5 MG) BY MOUTH DAILY. 90 tablet 3   glucose blood (ACCU-CHEK GUIDE) test strip TEST BLOOD SUGAR ONE TIME DAILY AS DIRECTED 100 strip 0   lansoprazole (PREVACID) 30 MG capsule TAKE 1 CAPSULE EVERY DAY 90 capsule 1   metFORMIN (GLUCOPHAGE-XR) 500 MG 24 hr tablet TAKE 2 TABLETS EVERY DAY WITH BREAKFAST 180 tablet 3   metoprolol succinate (TOPROL-XL) 25 MG 24 hr tablet TAKE 1/2 TABLET EVERY DAY 45 tablet 2   Multiple Vitamin (MULTIVITAMIN) tablet Take 1 tablet by mouth daily.     nitroGLYCERIN (NITROSTAT) 0.4 MG SL tablet Dissolve 1 tablet under the tongue every 5 minutes as needed for chest pain. Max of 3  doses, then 911. 75 tablet 3   olopatadine (PATADAY) 0.1 % ophthalmic solution Place 1 drop into both eyes 2 (two) times daily. 5 mL 12   Omega-3 Fatty Acids (FISH OIL) 1000 MG CPDR Take 1,000 mg by mouth every morning.     simvastatin (ZOCOR) 10 MG tablet TAKE 1 TABLET AT BEDTIME 90 tablet 3   tamsulosin (FLOMAX) 0.4 MG CAPS capsule TAKE 1 CAPSULE EVERY DAY 90 capsule 3   tiZANidine (ZANAFLEX) 2 MG tablet Take 1 tablet (2 mg total) by mouth every 6 (six) hours as needed for muscle spasms. 30 tablet 1   No current facility-administered medications on file prior to visit.   Allergies  Allergen Reactions   Imdur [Isosorbide Mononitrate]     Unknown, per pt    Meloxicam Swelling    Facial swelling    Social History   Socioeconomic History   Marital status: Married    Spouse name: [place]   Number of  children: 2   Years of education: Not on file   Highest education level: Not on file  Occupational History   Occupation: Retired  Tobacco Use   Smoking status: Never   Smokeless tobacco: Never  Vaping Use   Vaping Use: Never used  Substance and Sexual Activity   Alcohol use: No   Drug use: No   Sexual activity: Not Currently  Other Topics Concern   Not on file  Social History Narrative   Married in 1961.   6 grandchildren   Social Determinants of Health   Financial Resource Strain: Low Risk  (09/26/2021)   Overall Financial Resource Strain (CARDIA)    Difficulty of Paying Living Expenses: Not hard at all  Food Insecurity: No Food Insecurity (10/16/2022)   Hunger Vital Sign    Worried About Running Out of Food in the Last Year: Never true    [name] in the Last Year: Never true  Transportation Needs: No Transportation Needs (10/16/2022)   PRAPARE - Hydrologist (Medical): No    Lack of Transportation (Non-Medical): No  Physical Activity: Insufficiently Active (09/26/2021)   Exercise Vital Sign    Days of Exercise per Week: 5 days    Minutes of Exercise per Session: 20 min  Stress: No Stress Concern Present (09/26/2021)   [place]    Feeling of Stress : Not at all  Social Connections: [place] (09/26/2021)   Social Connection and Isolation Panel [NHANES]    Frequency of Communication with Friends and Family: Three times a week    Frequency of Social Gatherings with Friends and Family: Three times a week    Attends Religious Services: More than 4 times per year    Active Member of Clubs or Organizations: Yes    Attends Archivist Meetings: More than 4 times per year    Marital Status: Married  Human resources officer Violence: Not At Risk (10/16/2022)   Humiliation, Afraid, Rape, and Kick questionnaire    Fear of Current or Ex-Partner: No    Emotionally  Abused: No    Physically Abused: No    Sexually Abused: No      Review of Systems  All other systems reviewed and are negative.      Objective:   Physical Exam Vitals reviewed.  Constitutional:      Appearance: Normal appearance.  Cardiovascular:     Rate and Rhythm: Normal rate and regular rhythm.  Heart sounds: Normal heart sounds. No murmur heard.    No friction rub. No gallop.  Pulmonary:     Effort: Pulmonary effort is normal. No respiratory distress.     Breath sounds: Normal breath sounds and air entry. No stridor, decreased air movement or transmitted upper airway sounds. No wheezing, rhonchi or rales.  Musculoskeletal:     Lumbar back: No spasms or tenderness. Decreased range of motion.     Right lower leg: No edema.     Left lower leg: No edema.       Legs:  Neurological:     Mental Status: He is alert.     7 cm erythematous ring with central clearing      Assessment & Plan:  DDD (degenerative disc disease), lumbar - Plan: MR Lumbar Spine Wo Contrast  Sciatica of left side - Plan: MR Lumbar Spine Wo Contrast Pain is an atypical pain however it is neuropathic in nature.  Vascular etiology has been ruled out by vascular surgery.  Therefore I feel that this might be lumbar radiculopathy.  He certainly has abnormality seen on his lumbar spine x-ray from 2022 specifically 6 mm anterolisthesis of L2 on L3.  Therefore, I will obtain an MRI of the lumbar spine to see if the patient is a candidate for epidural steroid injections to help pain

## 2022-11-24 ENCOUNTER — Telehealth: Payer: Self-pay | Admitting: Family Medicine

## 2022-11-24 ENCOUNTER — Other Ambulatory Visit: Payer: Self-pay | Admitting: Family Medicine

## 2022-11-24 DIAGNOSIS — L921 Necrobiosis lipoidica, not elsewhere classified: Secondary | ICD-10-CM

## 2022-11-24 NOTE — Telephone Encounter (Signed)
Patient called to request a referral to see a dermatologist for an ongoing skin issue he's been having. Requesting to be referred to Dr. Allyson Sabal on [facility] in [place].  Please advise patient when referral sent at [phone].

## 2022-12-02 DIAGNOSIS — R351 Nocturia: Secondary | ICD-10-CM | POA: Diagnosis not present

## 2022-12-02 DIAGNOSIS — E291 Testicular hypofunction: Secondary | ICD-10-CM | POA: Diagnosis not present

## 2022-12-02 DIAGNOSIS — N403 Nodular prostate with lower urinary tract symptoms: Secondary | ICD-10-CM | POA: Diagnosis not present

## 2022-12-08 ENCOUNTER — Ambulatory Visit (INDEPENDENT_AMBULATORY_CARE_PROVIDER_SITE_OTHER): Payer: Medicare HMO

## 2022-12-08 ENCOUNTER — Telehealth: Payer: Self-pay

## 2022-12-08 VITALS — BP 106/68 | Ht 68.0 in | Wt 148.0 lb

## 2022-12-08 DIAGNOSIS — Z0000 Encounter for general adult medical examination without abnormal findings: Secondary | ICD-10-CM

## 2022-12-08 NOTE — Telephone Encounter (Addendum)
Called and left message for patient.  Will try again.

## 2022-12-08 NOTE — Progress Notes (Signed)
Subjective:   Andrew [name] is a 82 y.o. male who presents for Medicare Annual/Subsequent preventive examination.  Review of Systems     Cardiac Risk Factors include: advanced age (>67mn, >>52women);dyslipidemia;hypertension;male gender     Objective:    Today's Vitals   12/08/22 1125  BP: 106/68  Weight: 148 lb (67.1 kg)  Height: '5\' 8"'$  (1.727 m)   Body mass index is 22.5 kg/m.     12/08/2022   11:50 AM 10/16/2022   10:14 AM 04/10/2022   10:06 AM 09/26/2021   10:42 AM 09/24/2021    3:04 PM 03/25/2021    2:21 PM 09/17/2020    2:12 PM  Advanced Directives  Does Patient Have a Medical Advance Directive? Yes No No No No No No  Does patient want to make changes to medical advance directive? No - Patient declined        Would patient like information on creating a medical advance directive?  No - Patient declined No - Patient declined No - Patient declined No - Patient declined No - Patient declined No - Patient declined    Current Medications (verified) Outpatient Encounter Medications as of 12/08/2022  Medication Sig   Accu-Chek Softclix Lancets lancets TEST BLOOD SUGAR ONE TIME DAILY   Alcohol Swabs (DROPSAFE ALCOHOL PREP) 70 % PADS USE TOPICALLY ONE TIME DAILY   amLODipine (NORVASC) 5 MG tablet TAKE 1 TABLET(5 MG) BY MOUTH DAILY   aspirin 81 MG tablet Take 81 mg by mouth daily.   augmented betamethasone dipropionate (DIPROLENE-AF) 0.05 % cream Apply topically.   Blood Glucose Monitoring Suppl (ACCU-CHEK GUIDE ME) w/Device KIT Use as instructed to monitor FSBS 1x daily. Dx: E11.9   Cholecalciferol (VITAMIN D) 2000 units CAPS Take 1 capsule by mouth daily.   clobetasol cream (TEMOVATE) 0.05 % Apply topically.   finasteride (PROSCAR) 5 MG tablet TAKE 1 TABLET(5 MG) BY MOUTH DAILY.   glucose blood (ACCU-CHEK GUIDE) test strip TEST BLOOD SUGAR ONE TIME DAILY AS DIRECTED   lansoprazole (PREVACID) 30 MG capsule TAKE 1 CAPSULE EVERY DAY   metFORMIN (GLUCOPHAGE-XR) 500 MG 24 hr  tablet TAKE 2 TABLETS EVERY DAY WITH BREAKFAST   metoprolol succinate (TOPROL-XL) 25 MG 24 hr tablet TAKE 1/2 TABLET EVERY DAY   Multiple Vitamin (MULTIVITAMIN) tablet Take 1 tablet by mouth daily.   nitroGLYCERIN (NITROSTAT) 0.4 MG SL tablet Dissolve 1 tablet under the tongue every 5 minutes as needed for chest pain. Max of 3 doses, then 911.   olopatadine (PATADAY) 0.1 % ophthalmic solution Place 1 drop into both eyes 2 (two) times daily.   Omega-3 Fatty Acids (FISH OIL) 1000 MG CPDR Take 1,000 mg by mouth every morning.   simvastatin (ZOCOR) 10 MG tablet TAKE 1 TABLET AT BEDTIME   tamsulosin (FLOMAX) 0.4 MG CAPS capsule TAKE 1 CAPSULE EVERY DAY   tiZANidine (ZANAFLEX) 2 MG tablet Take 1 tablet (2 mg total) by mouth every 6 (six) hours as needed for muscle spasms.   No facility-administered encounter medications on file as of 12/08/2022.    Allergies (verified) Imdur [isosorbide mononitrate] and Meloxicam   History: Past Medical History:  Diagnosis Date   Allergy    grass etc.   Barrett's esophagus    BPH (benign prostatic hyperplasia)    Chest pain, atypical    Chronic kidney disease    kidney stones   Coronary artery disease    GERD (gastroesophageal reflux disease)    Barrett's   Hernia  Hyperlipidemia    Hypertension    Myocardial infarction ([place])    Thrombocytopenia ([place])    Ulcer 1960   Past Surgical History:  Procedure Laterality Date   CARDIAC CATHETERIZATION  02/14/2007   MITRAL REGURGITATION. LV NORMAL   CHOLECYSTECTOMY     COLONOSCOPY     CORONARY ARTERY BYPASS GRAFT     twice  2 by passes each time   INGUINAL HERNIA REPAIR     bilateral done twice   LITHOTRIPSY     UPPER GASTROINTESTINAL ENDOSCOPY     Family History  Problem Relation Age of Onset   Stroke Father    Heart attack Mother    Heart attack Maternal Grandmother    Heart attack Maternal Grandfather    Heart disease Sister    Heart disease Brother    Heart disease Sister    Heart disease  Brother    Prostate cancer Brother    Colon cancer Neg Hx    Esophageal cancer Neg Hx    Rectal cancer Neg Hx    Stomach cancer Neg Hx    Social History   Socioeconomic History   Marital status: Married    Spouse name: [place]   Number of children: 2   Years of education: Not on file   Highest education level: Not on file  Occupational History   Occupation: Retired  Tobacco Use   Smoking status: Never   Smokeless tobacco: Never  Vaping Use   Vaping Use: Never used  Substance and Sexual Activity   Alcohol use: No   Drug use: No   Sexual activity: Not Currently  Other Topics Concern   Not on file  Social History Narrative   Married in 1961.   6 grandchildren   Social Determinants of Health   Financial Resource Strain: Low Risk  (12/08/2022)   Overall Financial Resource Strain (CARDIA)    Difficulty of Paying Living Expenses: Not hard at all  Food Insecurity: No Food Insecurity (12/08/2022)   Hunger Vital Sign    Worried About Running Out of Food in the Last Year: Never true    [place] in the Last Year: Never true  Transportation Needs: No Transportation Needs (12/08/2022)   PRAPARE - Hydrologist (Medical): No    Lack of Transportation (Non-Medical): No  Physical Activity: Sufficiently Active (12/08/2022)   Exercise Vital Sign    Days of Exercise per Week: 5 days    Minutes of Exercise per Session: 30 min  Stress: No Stress Concern Present (12/08/2022)   [place]    Feeling of Stress : Not at all  Social Connections: [place] (12/08/2022)   Social Connection and Isolation Panel [NHANES]    Frequency of Communication with Friends and Family: More than three times a week    Frequency of Social Gatherings with Friends and Family: Three times a week    Attends Religious Services: More than 4 times per year    Active Member of Clubs or Organizations: Yes     Attends Music therapist: More than 4 times per year    Marital Status: Married    Tobacco Counseling Counseling given: Not Answered   Clinical Intake:  Pre-visit preparation completed: Yes  Pain : No/denies pain  Diabetes: No  How often do you need to have someone help you when you read instructions, pamphlets, or other written materials from your doctor or pharmacy?:  1 - Never  Diabetic?No   Interpreter Needed?: No  Information entered by :: Andrew George LPN   Activities of Daily Living    12/08/2022    1:54 PM  In your present state of health, do you have any difficulty performing the following activities:  Hearing? 0  Vision? 0  Difficulty concentrating or making decisions? 0  Walking or climbing stairs? 0  Dressing or bathing? 0  Doing errands, shopping? 0  Preparing Food and eating ? N  Using the Toilet? N  In the past six months, have you accidently leaked urine? N  Do you have problems with loss of bowel control? N  Managing your Medications? N  Managing your Finances? N  Housekeeping or managing your Housekeeping? N    Patient Care Team: Susy Frizzle, MD as PCP - General (Family Medicine) Nahser, Wonda Cheng, MD as PCP - Cardiology (Cardiology) Edythe Clarity, [facility] as Pharmacist (Pharmacist) Edythe Clarity, [facility] (Pharmacist) Allyn Kenner, MD (Dermatology) Irine Seal, MD as Attending Physician (Urology)  Indicate any recent Medical Services you may have received from other than [facility] providers in the past year (date may be approximate).     Assessment:   This is a routine wellness examination for Andrew [name].  Hearing/Vision screen Hearing Screening - Comments:: Denies hearing difficulties  Vision Screening - Comments:: up to date with routine eye exams with Dr. Jorja Loa   Dietary issues and exercise activities discussed: Current Exercise Habits: Home exercise routine, Type of exercise: walking, Time (Minutes): 30, Frequency  (Times/Week): 5, Weekly Exercise (Minutes/Week): 150, Intensity: Mild   Goals Addressed             This Visit's Progress    COMPLETED: Pharmacy Care Plan:       CARE PLAN ENTRY (see longitudinal plan of care for additional care plan information)  Current Barriers:  Chronic Disease Management support, education, and care coordination needs related to Hypertension, Hyperlipidemia, and Pre-DM   Hypertension BP Readings from Last 3 Encounters:  06/18/20 130/70  06/04/20 128/64  03/15/20 126/69  Pharmacist Clinical Goal(s): Over the next 180 days, patient will work with PharmD and providers to maintain BP goal <130/80 Current regimen:  Metoprolol XL '25mg'$  one-half tablet daily Interventions: Reviewed most recent BP Patient self care activities - Over the next 180 days, patient will: Check BP periodically, document, and provide at future appointments Ensure daily salt intake < 2300 mg/day  Hyperlipidemia Lab Results  Component Value Date/Time   LDLCALC 58 05/31/2020 08:48 AM   LDLCALC 55 02/08/2020 08:02 AM  Pharmacist Clinical Goal(s): Over the next 180 days, patient will work with PharmD and providers to maintain LDL goal < 70 Current regimen:  Simvastatin '10mg'$  Interventions: Discussed goals for total cholesterol < 200 and LDL < 70 Counseled on benefits of statin medications Patient self care activities - Over the next 180 days, patient will: Continue to focus on medication adherence  Notify providers of any new or worsening muscle pains  Diabetes Lab Results  Component Value Date/Time   HGBA1C 7.2 (H) 05/31/2020 08:48 AM   HGBA1C 6.6 (H) 02/08/2020 08:02 AM  Pharmacist Clinical Goal(s): Over the next 180 days, patient will work with PharmD and providers to achieve A1c goal <7% Current regimen:  No medications Interventions: Reviewed most recent A1c Discussed diabetic diet and limiting carbs to one source per meal Patient self care activities - Over the next 180  days, patient will: Check blood sugar at MD visit, document, and provide  at future appointments Continue to work on dietary modifications limiting carbohydrates and sweets  Initial goal documentation      COMPLETED: Track and Manage My Blood Pressure-Hypertension       Timeframe:  Long-Range Goal Priority:  High Start Date:      02/06/21                       Expected End Date:   08/08/21                    Follow Up Date 05/08/21   - check blood pressure 3 times per week - choose a place to take my blood pressure (home, clinic or office, retail store) - write blood pressure results in a log or diary    Why is this important?   You won't feel high blood pressure, but it can still hurt your blood vessels.  High blood pressure can cause heart or kidney problems. It can also cause a stroke.  Making lifestyle changes like losing a little weight or eating less salt will help.  Checking your blood pressure at home and at different times of the day can help to control blood pressure.  If the doctor prescribes medicine remember to take it the way the doctor ordered.  Call the office if you cannot afford the medicine or if there are questions about it.     Notes:        Depression Screen    12/08/2022    1:52 PM 11/19/2022   12:01 PM 10/16/2022   10:36 AM 08/24/2022    2:19 PM 08/07/2022    9:05 AM 10/10/2021   12:20 PM 09/26/2021   10:38 AM  PHQ 2/9 Scores  PHQ - 2 Score 0 0 0 0 0 0 0  PHQ- 9 Score      4     Fall Risk    12/08/2022    1:51 PM 11/19/2022   12:01 PM 08/07/2022    9:05 AM 09/26/2021   10:42 AM 01/10/2021   12:02 PM  [place] in the past year? 0 0 0 0 0  Number falls in past yr: 0 0 0 0 0  Injury with Fall? 0 0 0 0 0  Risk for fall due to :  No Fall Risks No Fall Risks No Fall Risks   Follow up Falls prevention discussed;Education provided;Falls evaluation completed Falls prevention discussed Falls prevention discussed Falls prevention discussed      FALL RISK PREVENTION PERTAINING TO THE HOME:  Any stairs in or around the home? No  If so, are there any without handrails? No  Home free of loose throw rugs in walkways, pet beds, electrical cords, etc? Yes  Adequate lighting in your home to reduce risk of falls? Yes   ASSISTIVE DEVICES UTILIZED TO PREVENT FALLS:  Life alert? No  Use of a cane, walker or w/c? No  Grab bars in the bathroom? Yes  Shower chair or bench in shower? No  Elevated toilet seat or a handicapped toilet? Yes   TIMED UP AND GO:  Was the test performed? Yes .  Length of time to ambulate 10 feet: 6 sec.   Gait steady and fast without use of assistive device  Cognitive Function:        12/08/2022    1:54 PM 08/27/2020    8:29 AM  6CIT Screen  What Year? 0 points 0 points  What  month? 0 points 0 points  What time? 0 points 0 points  Count back from 20 0 points 0 points  Months in reverse 0 points 0 points  Repeat phrase 0 points 0 points  Total Score 0 points 0 points    Immunizations Immunization History  Administered Date(s) Administered   Fluad Quad(high Dose 65+) 06/30/2019, 06/18/2020, 08/07/2021, 09/15/2022   Influenza Split 07/07/2012   Influenza Whole 07/18/2010   Influenza, High Dose Seasonal PF 08/16/2017, 09/07/2018   Influenza,inj,Quad PF,6+ Mos 07/24/2013, 09/10/2014, 08/30/2015, 08/21/2016   Moderna Covid-19 Vaccine Bivalent Booster 26yr & up 12/20/2019, 01/17/2020   Moderna Sars-Covid-2 Vaccination 12/20/2019, 01/17/2020, 10/18/2020   Pneumococcal Conjugate-13 09/10/2014   Pneumococcal Polysaccharide-23 08/16/2007   Td 07/30/2003   Zoster, Live 06/11/2006    TDAP status: Due, Education has been provided regarding the importance of this vaccine. Advised may receive this vaccine at local pharmacy or Health Dept. Aware to provide a copy of the vaccination record if obtained from local pharmacy or Health Dept. Verbalized acceptance and understanding.  Flu Vaccine status: Up  to date  Pneumococcal vaccine status: Up to date  Covid-19 vaccine status: Information provided on how to obtain vaccines.   Qualifies for Shingles Vaccine? Yes   Zostavax completed Yes   Shingrix Completed?: No.    Education has been provided regarding the importance of this vaccine. Patient has been advised to call insurance company to determine out of pocket expense if they have not yet received this vaccine. Advised may also receive vaccine at local pharmacy or Health Dept. Verbalized acceptance and understanding.  Screening Tests Health Maintenance  Topic Date Due   COVID-19 Vaccine (6 - 2023-24 season) 12/11/2022 (Originally 06/12/2022)   Diabetic kidney evaluation - Urine ACR  01/11/2023 (Originally 12/21/1958)   Zoster Vaccines- Shingrix (1 of 2) 01/19/2023 (Originally 12/21/1959)   Diabetic kidney evaluation - eGFR measurement  08/05/2023   Medicare Annual Wellness (AWV)  12/09/2023   Pneumonia Vaccine 82 Years old  Completed   INFLUENZA VACCINE  Completed   HPV VACCINES  Aged Out   DTaP/Tdap/Td  Discontinued   COLONOSCOPY (Pts [phone] coverage will need to be confirmed)  Discontinued    Health Maintenance  There are no preventive care reminders to display for this patient.   Colorectal cancer screening: No longer required.   Lung Cancer Screening: (Low Dose CT Chest recommended if Age 84-80ears, 30 pack-year currently smoking OR have quit w/in 15years.) does not qualify.   Lung Cancer Screening Referral: n/a  Additional Screening:  Hepatitis C Screening: does not qualify  Vision Screening: Recommended annual ophthalmology exams for early detection of glaucoma and other disorders of the eye. Is the patient up to date with their annual eye exam?  Yes  Who is the provider or what is the name of the office in which the patient attends annual eye exams? Dr. CoJorja Loaf pt is not established with a provider, would they like to be referred to a provider to  establish care? No .   Dental Screening: Recommended annual dental exams for proper oral hygiene  Community Resource Referral / Chronic Care Management: CRR required this visit?  No   CCM required this visit?  No      Plan:     I have personally reviewed and noted the following in the patient's chart:   Medical and social history Use of alcohol, tobacco or illicit drugs  Current medications and supplements including opioid prescriptions. Patient is not currently taking  opioid prescriptions. Functional ability and status Nutritional status Physical activity Advanced directives List of other physicians Hospitalizations, surgeries, and ER visits in previous 12 months Vitals Screenings to include cognitive, depression, and falls Referrals and appointments  In addition, I have reviewed and discussed with patient certain preventive protocols, quality metrics, and best practice recommendations. A written personalized care plan for preventive services as well as general preventive health recommendations were provided to patient.     Andrew [name], [place]   [id number]   Nurse Notes: See telephone note with patient concerns

## 2022-12-08 NOTE — Patient Instructions (Addendum)
[name] , Thank you for taking time to come for your Medicare Wellness Visit. I appreciate your ongoing commitment to your health goals. Please review the following plan we discussed and let me know if I can assist you in the future.   These are the goals we discussed:  Goals      Have 3 meals a day        This is a list of the screening recommended for you and due dates:  Health Maintenance  Topic Date Due   COVID-19 Vaccine (6 - 2023-24 season) 12/11/2022*   Yearly kidney health urinalysis for diabetes  01/11/2023*   Zoster (Shingles) Vaccine (1 of 2) 01/19/2023*   Yearly kidney function blood test for diabetes  08/05/2023   Medicare Annual Wellness Visit  12/09/2023   Pneumonia Vaccine  Completed   Flu Shot  Completed   HPV Vaccine  Aged Out   DTaP/Tdap/Td vaccine  Discontinued   Colon Cancer Screening  Discontinued  *Topic was postponed. The date shown is not the original due date.    Advanced directives: Advance directive discussed with you today. I have provided a copy for you to complete at home and have notarized. Once this is complete please bring a copy in to our office so we can scan it into your chart.   Conditions/risks identified: Aim for 30 minutes of exercise or brisk walking, 6-8 glasses of water, and 5 servings of fruits and vegetables each day.   Next appointment: Follow up in one year for your annual wellness visit.   Preventive Care 84 Years and Older, Male  Preventive care refers to lifestyle choices and visits with your health care provider that can promote health and wellness. What does preventive care include? A yearly physical exam. This is also called an annual well check. Dental exams once or twice a year. Routine eye exams. Ask your health care provider how often you should have your eyes checked. Personal lifestyle choices, including: Daily care of your teeth and gums. Regular physical activity. Eating a healthy diet. Avoiding tobacco and  drug use. Limiting alcohol use. Practicing safe sex. Taking low doses of aspirin every day. Taking vitamin and mineral supplements as recommended by your health care provider. What happens during an annual well check? The services and screenings done by your health care provider during your annual well check will depend on your age, overall health, lifestyle risk factors, and family history of disease. Counseling  Your health care provider may ask you questions about your: Alcohol use. Tobacco use. Drug use. Emotional well-being. Home and relationship well-being. Sexual activity. Eating habits. History of falls. Memory and ability to understand (cognition). Work and work Statistician. Screening  You may have the following tests or measurements: Height, weight, and BMI. Blood pressure. Lipid and cholesterol levels. These may be checked every 5 years, or more frequently if you are over 75 years old. Skin check. Lung cancer screening. You may have this screening every year starting at age 67 if you have a 30-pack-year history of smoking and currently smoke or have quit within the past 15 years. Fecal occult blood test (FOBT) of the stool. You may have this test every year starting at age 20. Flexible sigmoidoscopy or colonoscopy. You may have a sigmoidoscopy every 5 years or a colonoscopy every 10 years starting at age 86. Prostate cancer screening. Recommendations will vary depending on your family history and other risks. Hepatitis C blood test. Hepatitis B blood test. Sexually transmitted  disease (STD) testing. Diabetes screening. This is done by checking your blood sugar (glucose) after you have not eaten for a while (fasting). You may have this done every 1-3 years. Abdominal aortic aneurysm (AAA) screening. You may need this if you are a current or former smoker. Osteoporosis. You may be screened starting at age 41 if you are at high risk. Talk with your health care provider  about your test results, treatment options, and if necessary, the need for more tests. Vaccines  Your health care provider may recommend certain vaccines, such as: Influenza vaccine. This is recommended every year. Tetanus, diphtheria, and acellular pertussis (Tdap, Td) vaccine. You may need a Td booster every 10 years. Zoster vaccine. You may need this after age 61. Pneumococcal 13-valent conjugate (PCV13) vaccine. One dose is recommended after age 71. Pneumococcal polysaccharide (PPSV23) vaccine. One dose is recommended after age 14. Talk to your health care provider about which screenings and vaccines you need and how often you need them. This information is not intended to replace advice given to you by your health care provider. Make sure you discuss any questions you have with your health care provider. Document Released: 10/25/2015 Document Revised: 06/17/2016 Document Reviewed: 07/30/2015 Elsevier Interactive Patient Education  2017 [place] Prevention in the Home Falls can cause injuries. They can happen to people of all ages. There are many things you can do to make your home safe and to help prevent falls. What can I do on the outside of my home? Regularly fix the edges of walkways and driveways and fix any cracks. Remove anything that might make you trip as you walk through a door, such as a raised step or threshold. Trim any bushes or trees on the path to your home. Use bright outdoor lighting. Clear any walking paths of anything that might make someone trip, such as rocks or tools. Regularly check to see if handrails are loose or broken. Make sure that both sides of any steps have handrails. Any raised decks and porches should have guardrails on the edges. Have any leaves, snow, or ice cleared regularly. Use sand or salt on walking paths during winter. Clean up any spills in your garage right away. This includes oil or grease spills. What can I do in the  bathroom? Use night lights. Install grab bars by the toilet and in the tub and shower. Do not use towel bars as grab bars. Use non-skid mats or decals in the tub or shower. If you need to sit down in the shower, use a plastic, non-slip stool. Keep the floor dry. Clean up any water that spills on the floor as soon as it happens. Remove soap buildup in the tub or shower regularly. Attach bath mats securely with double-sided non-slip rug tape. Do not have throw rugs and other things on the floor that can make you trip. What can I do in the bedroom? Use night lights. Make sure that you have a light by your bed that is easy to reach. Do not use any sheets or blankets that are too big for your bed. They should not hang down onto the floor. Have a firm chair that has side arms. You can use this for support while you get dressed. Do not have throw rugs and other things on the floor that can make you trip. What can I do in the kitchen? Clean up any spills right away. Avoid walking on wet floors. Keep items that you use a  lot in easy-to-reach places. If you need to reach something above you, use a strong step stool that has a grab bar. Keep electrical cords out of the way. Do not use floor polish or wax that makes floors slippery. If you must use wax, use non-skid floor wax. Do not have throw rugs and other things on the floor that can make you trip. What can I do with my stairs? Do not leave any items on the stairs. Make sure that there are handrails on both sides of the stairs and use them. Fix handrails that are broken or loose. Make sure that handrails are as long as the stairways. Check any carpeting to make sure that it is firmly attached to the stairs. Fix any carpet that is loose or worn. Avoid having throw rugs at the top or bottom of the stairs. If you do have throw rugs, attach them to the floor with carpet tape. Make sure that you have a light switch at the top of the stairs and the  bottom of the stairs. If you do not have them, ask someone to add them for you. What else can I do to help prevent falls? Wear shoes that: Do not have high heels. Have rubber bottoms. Are comfortable and fit you well. Are closed at the toe. Do not wear sandals. If you use a stepladder: Make sure that it is fully opened. Do not climb a closed stepladder. Make sure that both sides of the stepladder are locked into place. Ask someone to hold it for you, if possible. Clearly mark and make sure that you can see: Any grab bars or handrails. First and last steps. Where the edge of each step is. Use tools that help you move around (mobility aids) if they are needed. These include: Canes. Walkers. Scooters. Crutches. Turn on the lights when you go into a dark area. Replace any light bulbs as soon as they burn out. Set up your furniture so you have a clear path. Avoid moving your furniture around. If any of your floors are uneven, fix them. If there are any pets around you, be aware of where they are. Review your medicines with your doctor. Some medicines can make you feel dizzy. This can increase your chance of falling. Ask your doctor what other things that you can do to help prevent falls. This information is not intended to replace advice given to you by your health care provider. Make sure you discuss any questions you have with your health care provider. Document Released: 07/25/2009 Document Revised: 03/05/2016 Document Reviewed: 11/02/2014 Elsevier Interactive Patient Education  2017 [employer].

## 2022-12-08 NOTE — Telephone Encounter (Signed)
Patient seen for AWV and states that he has had a decrease in appetite and is having 4-5 bowel movements per day.  Is wondering if he can stop Metformin for a trial until his next appointment.  He is scheduled for follow visit on 02/04/23.  He is also asking for a referral to [facility] dermatology for a 2nd opinion on rash that he has on his left lower leg. Please advise.

## 2022-12-10 ENCOUNTER — Telehealth: Payer: Self-pay | Admitting: Family Medicine

## 2022-12-10 NOTE — Telephone Encounter (Signed)
Contacted Andrew [name] to schedule their annual wellness visit. Appointment made for 12/16/2023.  .step

## 2022-12-15 ENCOUNTER — Ambulatory Visit (HOSPITAL_COMMUNITY)
Admission: RE | Admit: 2022-12-15 | Discharge: 2022-12-15 | Disposition: A | Discharge status: Home / Self Care | Payer: Medicare HMO | Source: Ambulatory Visit | Attending: Family Medicine | Admitting: Family Medicine

## 2022-12-15 DIAGNOSIS — M545 Low back pain, unspecified: Secondary | ICD-10-CM | POA: Diagnosis not present

## 2022-12-15 DIAGNOSIS — M51369 Other intervertebral disc degeneration, lumbar region without mention of lumbar back pain or lower extremity pain: Secondary | ICD-10-CM

## 2022-12-15 DIAGNOSIS — M5136 Other intervertebral disc degeneration, lumbar region: Secondary | ICD-10-CM | POA: Diagnosis not present

## 2022-12-15 DIAGNOSIS — M5432 Sciatica, left side: Secondary | ICD-10-CM | POA: Diagnosis not present

## 2022-12-15 DIAGNOSIS — M4316 Spondylolisthesis, lumbar region: Secondary | ICD-10-CM | POA: Diagnosis not present

## 2022-12-17 ENCOUNTER — Ambulatory Visit (INDEPENDENT_AMBULATORY_CARE_PROVIDER_SITE_OTHER): Payer: Medicare HMO | Admitting: Family Medicine

## 2022-12-17 ENCOUNTER — Encounter: Payer: Self-pay | Admitting: Family Medicine

## 2022-12-17 VITALS — BP 122/58 | HR 73 | Temp 98.5°F | Ht 68.0 in | Wt 148.0 lb

## 2022-12-17 DIAGNOSIS — E118 Type 2 diabetes mellitus with unspecified complications: Secondary | ICD-10-CM | POA: Diagnosis not present

## 2022-12-17 DIAGNOSIS — Z125 Encounter for screening for malignant neoplasm of prostate: Secondary | ICD-10-CM | POA: Diagnosis not present

## 2022-12-17 MED ORDER — EMPAGLIFLOZIN 25 MG PO TABS: 25.0000 mg | ORAL_TABLET | Freq: Every day | ORAL | 11 refills | Status: DC

## 2022-12-17 NOTE — Progress Notes (Signed)
Subjective:    Patient ID: Andrew [name], male    DOB: 1941-03-08, 82 y.o.   MRN: [id number] 11/19/22 Patient has been dealing with left-sided leg pain now for several months.  Initially I was concerned about claudication.  We did a vascular study we were unable to measure ABIs due to noncompressibility of the arteries in his legs.  Therefore I consulted vascular surgery and they did not feel that his leg pain was due to any ischemia.  He continues to report an aching throbbing pain in his left calf.  It is worse at night when he lays down.  He also reports numbness occasionally in his left leg.  He had an x-ray of the lumbar spine that showed 6 mm of anterior listhesis of L2 on L3 with possible neuroforaminal impingement.  This was performed in 2022.  He also has degenerative disc disease in the lumbar spine.  Therefore I am starting to be concerned that this may be atypical lumbar radiculopathy.  At that time, my plan was: Pain is an atypical pain however it is neuropathic in nature.  Vascular etiology has been ruled out by vascular surgery.  Therefore I feel that this might be lumbar radiculopathy.  He certainly has abnormality seen on his lumbar spine x-ray from 2022 specifically 6 mm anterolisthesis of L2 on L3.  Therefore, I will obtain an MRI of the lumbar spine to see if the patient is a candidate for epidural steroid injections to help pain  12/17/22 MRI IMPRESSION: 1. L4-L5 mild spinal canal stenosis with moderate left and mild right neural foraminal narrowing. 2. L3-L4 mild right neural foraminal narrowing. 3. Narrowing of the lateral recesses at L4-L5 could affect the descending L5 nerve roots. Patient recently quit metformin due to diarrhea.  He believes the diarrhea may have improved slightly off the metformin.  However sugars is not substantially.  However postmeal sugars are now consistently above 180 with several being over 230.  The pain radiating down his left leg has improved.  He  does have aching pain on his anterior shin.  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  Patient has asked for second opinion with a different dermatologist however he has not heard from them yet. Past Medical History:  Diagnosis Date   Allergy    grass etc.   Barrett's esophagus    BPH (benign prostatic hyperplasia)    Chest pain, atypical    Chronic kidney disease    kidney stones   Coronary artery disease    GERD (gastroesophageal reflux disease)    Barrett's   Hernia    Hyperlipidemia    Hypertension    Myocardial infarction ([place])    Thrombocytopenia ([place])    Ulcer 1960   Past Surgical History:  Procedure Laterality Date   CARDIAC CATHETERIZATION  02/14/2007   MITRAL REGURGITATION. LV NORMAL   CHOLECYSTECTOMY     COLONOSCOPY     CORONARY ARTERY BYPASS GRAFT     twice  2 by passes each time   INGUINAL HERNIA REPAIR     bilateral done twice   LITHOTRIPSY     UPPER GASTROINTESTINAL ENDOSCOPY     Current Outpatient Medications on File Prior to Visit  Medication Sig Dispense Refill   Accu-Chek Softclix Lancets lancets TEST BLOOD SUGAR ONE TIME DAILY 100 each 1   Alcohol Swabs (DROPSAFE ALCOHOL PREP) 70 % PADS USE TOPICALLY  ONE TIME DAILY 100 each 1   amLODipine (NORVASC) 5 MG tablet TAKE 1 TABLET(5 MG) BY MOUTH DAILY 90 tablet 3   aspirin 81 MG tablet Take 81 mg by mouth daily.     augmented betamethasone dipropionate (DIPROLENE-AF) 0.05 % cream Apply topically.     Blood Glucose Monitoring Suppl (ACCU-CHEK GUIDE ME) w/Device KIT Use as instructed to monitor FSBS 1x daily. Dx: E11.9 1 kit 1   Cholecalciferol (VITAMIN D) 2000 units CAPS Take 1 capsule by mouth daily.     clobetasol cream (TEMOVATE) 0.05 % Apply topically.     finasteride (PROSCAR) 5 MG tablet TAKE 1 TABLET(5 MG) BY MOUTH DAILY. 90 tablet 3   glucose blood (ACCU-CHEK GUIDE) test strip TEST BLOOD SUGAR ONE  TIME DAILY AS DIRECTED 100 strip 0   lansoprazole (PREVACID) 30 MG capsule TAKE 1 CAPSULE EVERY DAY 90 capsule 1   metFORMIN (GLUCOPHAGE-XR) 500 MG 24 hr tablet TAKE 2 TABLETS EVERY DAY WITH BREAKFAST 180 tablet 3   metoprolol succinate (TOPROL-XL) 25 MG 24 hr tablet TAKE 1/2 TABLET EVERY DAY 45 tablet 2   Multiple Vitamin (MULTIVITAMIN) tablet Take 1 tablet by mouth daily.     nitroGLYCERIN (NITROSTAT) 0.4 MG SL tablet Dissolve 1 tablet under the tongue every 5 minutes as needed for chest pain. Max of 3 doses, then 911. 75 tablet 3   olopatadine (PATADAY) 0.1 % ophthalmic solution Place 1 drop into both eyes 2 (two) times daily. 5 mL 12   Omega-3 Fatty Acids (FISH OIL) 1000 MG CPDR Take 1,000 mg by mouth every morning.     simvastatin (ZOCOR) 10 MG tablet TAKE 1 TABLET AT BEDTIME 90 tablet 3   tamsulosin (FLOMAX) 0.4 MG CAPS capsule TAKE 1 CAPSULE EVERY DAY 90 capsule 3   tiZANidine (ZANAFLEX) 2 MG tablet Take 1 tablet (2 mg total) by mouth every 6 (six) hours as needed for muscle spasms. 30 tablet 1   No current facility-administered medications on file prior to visit.   Allergies  Allergen Reactions   Imdur [Isosorbide Mononitrate]     Unknown, per pt    Meloxicam Swelling    Facial swelling    Social History   Socioeconomic History   Marital status: Married    Spouse name: [place]   Number of children: 2   Years of education: Not on file   Highest education level: Not on file  Occupational History   Occupation: Retired  Tobacco Use   Smoking status: Never   Smokeless tobacco: Never  Vaping Use   Vaping Use: Never used  Substance and Sexual Activity   Alcohol use: No   Drug use: No   Sexual activity: Not Currently  Other Topics Concern   Not on file  Social History Narrative   Married in 1961.   6 grandchildren   Social Determinants of Health   Financial Resource Strain: Low Risk  (12/08/2022)   Overall Financial Resource Strain (CARDIA)    Difficulty of Paying  Living Expenses: Not hard at all  Food Insecurity: No Food Insecurity (12/08/2022)   Hunger Vital Sign    Worried About Running Out of Food in the Last Year: Never true    [place] in the Last Year: Never true  Transportation Needs: No Transportation Needs (12/08/2022)   PRAPARE - Hydrologist (Medical): No    Lack of Transportation (Non-Medical): No  Physical Activity: Sufficiently Active (12/08/2022)   Exercise Vital Sign  Days of Exercise per Week: 5 days    Minutes of Exercise per Session: 30 min  Stress: No Stress Concern Present (12/08/2022)   [place]    Feeling of Stress : Not at all  Social Connections: [employer] (12/08/2022)   Social Connection and Isolation Panel [NHANES]    Frequency of Communication with Friends and Family: More than three times a week    Frequency of Social Gatherings with Friends and Family: Three times a week    Attends Religious Services: More than 4 times per year    Active Member of Clubs or Organizations: Yes    Attends Archivist Meetings: More than 4 times per year    Marital Status: Married  Human resources officer Violence: Not At Risk (12/08/2022)   Humiliation, Afraid, Rape, and Kick questionnaire    Fear of Current or Ex-Partner: No    Emotionally Abused: No    Physically Abused: No    Sexually Abused: No      Review of Systems  All other systems reviewed and are negative.      Objective:   Physical Exam Vitals reviewed.  Constitutional:      Appearance: Normal appearance.  Cardiovascular:     Rate and Rhythm: Normal rate and regular rhythm.     Heart sounds: Normal heart sounds. No murmur heard.    No friction rub. No gallop.  Pulmonary:     Effort: Pulmonary effort is normal. No respiratory distress.     Breath sounds: Normal breath sounds and air entry. No stridor, decreased air movement or transmitted upper  airway sounds. No wheezing, rhonchi or rales.  Musculoskeletal:     Lumbar back: No spasms or tenderness. Decreased range of motion.     Right lower leg: No edema.     Left lower leg: No edema.       Legs:  Neurological:     Mental Status: He is alert.     7 cm erythematous ring with central clearing      Assessment & Plan:  Controlled type 2 diabetes mellitus with complication, without long-term current use of insulin (HCC) - Plan: Hemoglobin A1c, CBC with Differential/Platelet, Lipid panel, COMPLETE METABOLIC PANEL WITH GFR  Screening for prostate cancer - Plan: PSA We have placed a referral for patient to see dermatology.  We gave him the contact information to reach out to them to check on status of his referral.  His metastatic has proved so I do not see an indication to refer him for an epidural steroid injection.  I will start the patient on Jardiance 25 mg daily for his elevated blood sugars after he discontinued the metformin

## 2022-12-18 LAB — CBC WITH DIFFERENTIAL/PLATELET
Absolute Monocytes: 493 cells/uL (ref 200–950)
Basophils Absolute: 38 cells/uL (ref 0–200)
Basophils Relative: 0.6 %
Eosinophils Absolute: 102 cells/uL (ref 15–500)
Eosinophils Relative: 1.6 %
HCT: 40.4 % (ref 38.5–50.0)
Hemoglobin: 13.6 g/dL (ref 13.2–17.1)
Lymphs Abs: 1261 cells/uL (ref 850–3900)
MCH: 30.4 pg (ref 27.0–33.0)
MCHC: 33.7 g/dL (ref 32.0–36.0)
MCV: 90.4 fL (ref 80.0–100.0)
MPV: 10.9 fL (ref 7.5–12.5)
Monocytes Relative: 7.7 %
Neutro Abs: 4506 cells/uL (ref 1500–7800)
Neutrophils Relative %: 70.4 %
Platelets: 70 10*3/uL — ABNORMAL LOW (ref 140–400)
RBC: 4.47 10*6/uL (ref 4.20–5.80)
RDW: 12 % (ref 11.0–15.0)
Total Lymphocyte: 19.7 %
WBC: 6.4 10*3/uL (ref 3.8–10.8)

## 2022-12-18 LAB — COMPLETE METABOLIC PANEL WITH GFR
AG Ratio: 2.1 (calc) (ref 1.0–2.5)
ALT: 17 U/L (ref 9–46)
AST: 15 U/L (ref 10–35)
Albumin: 4 g/dL (ref 3.6–5.1)
Alkaline phosphatase (APISO): 45 U/L (ref 35–144)
BUN: 18 mg/dL (ref 7–25)
CO2: 27 mmol/L (ref 20–32)
Calcium: 9.1 mg/dL (ref 8.6–10.3)
Chloride: 106 mmol/L (ref 98–110)
Creat: 0.8 mg/dL (ref 0.70–1.22)
Globulin: 1.9 g/dL (calc) (ref 1.9–3.7)
Glucose, Bld: 176 mg/dL — ABNORMAL HIGH (ref 65–99)
Potassium: 4.2 mmol/L (ref 3.5–5.3)
Sodium: 142 mmol/L (ref 135–146)
Total Bilirubin: 0.6 mg/dL (ref 0.2–1.2)
Total Protein: 5.9 g/dL — ABNORMAL LOW (ref 6.1–8.1)
eGFR: 88 mL/min/{1.73_m2} (ref >=60)

## 2022-12-18 LAB — PSA: PSA: 0.23 ng/mL (ref <=4.00)

## 2022-12-18 LAB — LIPID PANEL
Cholesterol: 110 mg/dL (ref <200)
HDL: 45 mg/dL (ref >=40)
LDL Cholesterol (Calc): 52 mg/dL (calc)
Non-HDL Cholesterol (Calc): 65 mg/dL (calc) (ref <130)
Total CHOL/HDL Ratio: 2.4 (calc) (ref <5.0)
Triglycerides: 51 mg/dL (ref <150)

## 2022-12-18 LAB — HEMOGLOBIN A1C
Hgb A1c MFr Bld: 6.5 % of total Hgb — ABNORMAL HIGH (ref <5.7)
Mean Plasma Glucose: 140 mg/dL
eAG (mmol/L): 7.7 mmol/L

## 2022-12-25 ENCOUNTER — Telehealth: Payer: Self-pay | Admitting: Cardiovascular Disease

## 2022-12-25 NOTE — Telephone Encounter (Signed)
Spoke with Sonia Side about upcoming dental procedure, no follow up from our office needed prior but instructed patient to confirm with his dentist as this may be a requirement for them. No further questions at this time.

## 2022-12-25 NOTE — Telephone Encounter (Signed)
New Message:     Patient said he is going to need dental surgery. His question is, does he need to see Dr Acie Fredrickson before his surgery?

## 2022-12-30 DIAGNOSIS — L921 Necrobiosis lipoidica, not elsewhere classified: Secondary | ICD-10-CM | POA: Diagnosis not present

## 2023-01-01 ENCOUNTER — Other Ambulatory Visit: Payer: Self-pay | Admitting: Family Medicine

## 2023-01-01 ENCOUNTER — Encounter: Payer: Self-pay | Admitting: Family Medicine

## 2023-01-01 ENCOUNTER — Ambulatory Visit (INDEPENDENT_AMBULATORY_CARE_PROVIDER_SITE_OTHER): Payer: Medicare HMO | Admitting: Family Medicine

## 2023-01-01 VITALS — BP 114/56 | HR 76 | Temp 98.0°F | Ht 68.0 in | Wt 146.8 lb

## 2023-01-01 DIAGNOSIS — K529 Noninfective gastroenteritis and colitis, unspecified: Secondary | ICD-10-CM

## 2023-01-01 MED ORDER — CHOLESTYRAMINE 4 G PO PACK: 4.0000 g | PACK | Freq: Two times a day (BID) | ORAL | 12 refills | Status: DC

## 2023-01-01 MED ORDER — COLESEVELAM HCL 625 MG PO TABS: 1250.0000 mg | ORAL_TABLET | Freq: Two times a day (BID) | ORAL | 5 refills | Status: DC

## 2023-01-01 NOTE — Progress Notes (Signed)
Subjective:    Patient ID: Andrew [name], male    DOB: [birth year], 82 y.o.   MRN: [id number]  Patient been dealing with chronic diarrhea for more than a year.  He has loose watery bowel movements on the basis.  At first we thought it was related to his juice that he was drinking so he stopped that.  He was also eating a lot of fruit such as grapes and strawberries and apples so we stopped that.  The diarrhea has fluctuated off and on over the last year but has become persistent recently.  He denies any blood in stool but he is losing weight.  We took him off metformin.  He saw no significant change stopping the metformin.  I recommended that he stay off of that because of the chronic diarrhea and replace it with Jardiance however he went back on the metformin and the diarrhea has worsened.  He presents today concerned about the diarrhea.  He is also concerned about the price of Jardiance which I was using to replace the metformin.  He denies any blood in stool or melena.  He denies any fevers or chills or abdominal pain  Past Medical History:  Diagnosis Date   Allergy    grass etc.   Barrett's esophagus    BPH (benign prostatic hyperplasia)    Chest pain, atypical    Chronic kidney disease    kidney stones   Coronary artery disease    GERD (gastroesophageal reflux disease)    Barrett's   Hernia    Hyperlipidemia    Hypertension    Myocardial infarction ([place])    Thrombocytopenia ([place])    Ulcer 1960   Past Surgical History:  Procedure Laterality Date   CARDIAC CATHETERIZATION  02/14/2007   MITRAL REGURGITATION. LV NORMAL   CHOLECYSTECTOMY     COLONOSCOPY     CORONARY ARTERY BYPASS GRAFT     twice  2 by passes each time   INGUINAL HERNIA REPAIR     bilateral done twice   LITHOTRIPSY     UPPER GASTROINTESTINAL ENDOSCOPY     Current Outpatient Medications on File Prior to Visit  Medication Sig Dispense Refill   Accu-Chek Softclix Lancets lancets TEST BLOOD SUGAR ONE TIME DAILY 100  each 1   Alcohol Swabs (DROPSAFE ALCOHOL PREP) 70 % PADS USE TOPICALLY ONE TIME DAILY 100 each 1   amLODipine (NORVASC) 5 MG tablet TAKE 1 TABLET(5 MG) BY MOUTH DAILY 90 tablet 3   aspirin 81 MG tablet Take 81 mg by mouth daily.     augmented betamethasone dipropionate (DIPROLENE-AF) 0.05 % cream Apply topically.     Blood Glucose Monitoring Suppl (ACCU-CHEK GUIDE ME) w/Device KIT Use as instructed to monitor FSBS 1x daily. Dx: E11.9 1 kit 1   Cholecalciferol (VITAMIN D) 2000 units CAPS Take 1 capsule by mouth daily.     clobetasol cream (TEMOVATE) 0.05 % Apply topically.     empagliflozin (JARDIANCE) 25 MG TABS tablet Take 1 tablet (25 mg total) by mouth daily before breakfast. 30 tablet 11   finasteride (PROSCAR) 5 MG tablet TAKE 1 TABLET(5 MG) BY MOUTH DAILY. 90 tablet 3   glucose blood (ACCU-CHEK GUIDE) test strip TEST BLOOD SUGAR ONE TIME DAILY AS DIRECTED 100 strip 0   lansoprazole (PREVACID) 30 MG capsule TAKE 1 CAPSULE EVERY DAY 90 capsule 1   metFORMIN (GLUCOPHAGE-XR) 500 MG 24 hr tablet TAKE 2 TABLETS EVERY DAY WITH BREAKFAST 180 tablet 3   metoprolol  succinate (TOPROL-XL) 25 MG 24 hr tablet TAKE 1/2 TABLET EVERY DAY 45 tablet 2   Multiple Vitamin (MULTIVITAMIN) tablet Take 1 tablet by mouth daily.     nitroGLYCERIN (NITROSTAT) 0.4 MG SL tablet Dissolve 1 tablet under the tongue every 5 minutes as needed for chest pain. Max of 3 doses, then 911. 75 tablet 3   olopatadine (PATADAY) 0.1 % ophthalmic solution Place 1 drop into both eyes 2 (two) times daily. 5 mL 12   Omega-3 Fatty Acids (FISH OIL) 1000 MG CPDR Take 1,000 mg by mouth every morning.     simvastatin (ZOCOR) 10 MG tablet TAKE 1 TABLET AT BEDTIME 90 tablet 3   tamsulosin (FLOMAX) 0.4 MG CAPS capsule TAKE 1 CAPSULE EVERY DAY 90 capsule 3   tiZANidine (ZANAFLEX) 2 MG tablet Take 1 tablet (2 mg total) by mouth every 6 (six) hours as needed for muscle spasms. 30 tablet 1   No current facility-administered medications on file  prior to visit.   Allergies  Allergen Reactions   Imdur [Isosorbide Mononitrate]     Unknown, per pt    Meloxicam Swelling    Facial swelling    Social History   Socioeconomic History   Marital status: Married    Spouse name: [place]   Number of children: 2   Years of education: Not on file   Highest education level: Not on file  Occupational History   Occupation: Retired  Tobacco Use   Smoking status: Never   Smokeless tobacco: Never  Vaping Use   Vaping Use: Never used  Substance and Sexual Activity   Alcohol use: No   Drug use: No   Sexual activity: Not Currently  Other Topics Concern   Not on file  Social History Narrative   Married in 1961.   6 grandchildren   Social Determinants of Health   Financial Resource Strain: Low Risk  (12/08/2022)   Overall Financial Resource Strain (CARDIA)    Difficulty of Paying Living Expenses: Not hard at all  Food Insecurity: No Food Insecurity (12/08/2022)   Hunger Vital Sign    Worried About Running Out of Food in the Last Year: Never true    [place] in the Last Year: Never true  Transportation Needs: No Transportation Needs (12/08/2022)   PRAPARE - Hydrologist (Medical): No    Lack of Transportation (Non-Medical): No  Physical Activity: Sufficiently Active (12/08/2022)   Exercise Vital Sign    Days of Exercise per Week: 5 days    Minutes of Exercise per Session: 30 min  Stress: No Stress Concern Present (12/08/2022)   [place]    Feeling of Stress : Not at all  Social Connections: [place] (12/08/2022)   Social Connection and Isolation Panel [NHANES]    Frequency of Communication with Friends and Family: More than three times a week    Frequency of Social Gatherings with Friends and Family: Three times a week    Attends Religious Services: More than 4 times per year    Active Member of Clubs or Organizations:  Yes    Attends Archivist Meetings: More than 4 times per year    Marital Status: Married  Human resources officer Violence: Not At Risk (12/08/2022)   Humiliation, Afraid, Rape, and Kick questionnaire    Fear of Current or Ex-Partner: No    Emotionally Abused: No    Physically Abused: No  Sexually Abused: No      Review of Systems  All other systems reviewed and are negative.      Objective:   Physical Exam Vitals reviewed.  Constitutional:      Appearance: Normal appearance.  Cardiovascular:     Rate and Rhythm: Normal rate and regular rhythm.     Heart sounds: Normal heart sounds. No murmur heard.    No friction rub. No gallop.  Pulmonary:     Effort: Pulmonary effort is normal. No respiratory distress.     Breath sounds: Normal breath sounds and air entry. No stridor, decreased air movement or transmitted upper airway sounds. No wheezing, rhonchi or rales.  Musculoskeletal:     Right lower leg: No edema.     Left lower leg: No edema.  Neurological:     Mental Status: He is alert.     7 cm erythematous ring with central clearing      Assessment & Plan:  Chronic diarrhea - Plan: Ambulatory referral to Gastroenterology Patient has chronic diarrhea.  At this point I feel that he needs to see GI to have a colonoscopy to rule out inflammatory bowel disease or other pathologic processes.  He does not have symptoms that suggest pancreatic insufficiency.  However he may have a malabsorptive process given the weight loss.  I would appreciate GIs opinion.  Also the differential diagnosis is IBS.  Given the duration of symptoms I feel it is prudent to have a colonoscopy to see.  I recommended staying off the metformin and I would recommend trying WelChol 2 tablets twice daily both to help manage his blood sugar and potentially reduce his diarrhea.  He can also use Imodium up to 4 times a day.

## 2023-01-03 ENCOUNTER — Other Ambulatory Visit: Payer: Self-pay | Admitting: Family Medicine

## 2023-01-04 ENCOUNTER — Telehealth: Payer: Self-pay

## 2023-01-04 NOTE — Telephone Encounter (Signed)
Primary Cardiologist:Philip Nahser, MD   Preoperative team, please contact this patient and set up a phone call appointment for further preoperative risk assessment. Please obtain consent and complete medication review. Thank you for your help.   Pending no symptoms of ACS and per office protocol, he may hold aspirin for 5-7 days prior to procedure and should resume as soon as hemodynamically stable postoperatively.   Emmaline Life, NP-C  01/04/2023, 1:03 PM [address] Office [phone] Fax [phone]

## 2023-01-04 NOTE — Telephone Encounter (Signed)
Caller is following-up on status of clearance.

## 2023-01-04 NOTE — Telephone Encounter (Signed)
   Pre-operative Risk Assessment    Patient Name: Andrew [name]  DOB: [birth year] MRN: [id number]     Request for Surgical Clearance    Procedure:  Dental Extraction - Amount of Teeth to be Pulled:  4  Date of Surgery:  Clearance TBD                                 Surgeon:   DR. [facility] Surgeon's Group or Practice Name:  [facility], [facility] Phone number:  [phone] Fax number:  [phone]   Type of Clearance Requested:   - Pharmacy:  Hold Aspirin INSTRUCTION ON HOW LONG TO HOLD    Type of Anesthesia:   IV SEDATION    Additional requests/questions:    SignedJacinta Shoe   01/04/2023, 12:48 PM

## 2023-01-04 NOTE — Telephone Encounter (Signed)
I left a message for the patient to call our office to schedule a tele visit for pre-op 

## 2023-01-04 NOTE — Telephone Encounter (Signed)
Left message for patient letting him know that we just received the clearance form today for the dental office and the provider will be looking over it.

## 2023-01-04 NOTE — Telephone Encounter (Signed)
Requested Prescriptions  Pending Prescriptions Disp Refills   amLODipine (NORVASC) 5 MG tablet [Pharmacy Med Name: AMLODIPINE BESYLATE 5 MG Tablet] 90 tablet 0    Sig: TAKE 1 TABLET EVERY DAY     Cardiovascular: Calcium Channel Blockers 2 Failed - 01/03/2023  3:18 AM      Failed - Valid encounter within last 6 months    Recent Outpatient Visits           1 year ago LLQ abdominal pain   [place] Pickard, Cammie Mcgee, MD   1 year ago Encounter for Medicare annual wellness exam   [facility] Pickard, Cammie Mcgee, MD   1 year ago Plantar fasciitis   [place] Susy Frizzle, MD   1 year ago Controlled type 2 diabetes mellitus with complication, without long-term current use of insulin ([place])   [place] Susy Frizzle, MD   1 year ago Controlled type 2 diabetes mellitus with complication, without long-term current use of insulin ([place])   [place] Pickard, Cammie Mcgee, MD       Future Appointments             In 1 month Pickard, Cammie Mcgee, MD [facility]            Passed - Last BP in normal range    BP Readings from Last 1 Encounters:  01/01/23 (!) 114/56         Passed - Last Heart Rate in normal range    Pulse Readings from Last 1 Encounters:  01/01/23 76

## 2023-01-05 ENCOUNTER — Ambulatory Visit (INDEPENDENT_AMBULATORY_CARE_PROVIDER_SITE_OTHER): Payer: Medicare HMO | Admitting: Family Medicine

## 2023-01-05 ENCOUNTER — Encounter: Payer: Self-pay | Admitting: Family Medicine

## 2023-01-05 VITALS — BP 120/62 | HR 74 | Temp 97.6°F | Ht 68.0 in | Wt 145.0 lb

## 2023-01-05 DIAGNOSIS — E118 Type 2 diabetes mellitus with unspecified complications: Secondary | ICD-10-CM

## 2023-01-05 DIAGNOSIS — K529 Noninfective gastroenteritis and colitis, unspecified: Secondary | ICD-10-CM

## 2023-01-05 NOTE — Progress Notes (Signed)
Subjective:    Patient ID: Andrew [name], male    DOB: 11/05/1940, 82 y.o.   MRN: [id number] 01/01/23 Patient been dealing with chronic diarrhea for more than a year.  He has loose watery bowel movements on the basis.  At first we thought it was related to his juice that he was drinking so he stopped that.  He was also eating a lot of fruit such as grapes and strawberries and apples so we stopped that.  The diarrhea has fluctuated off and on over the last year but has become persistent recently.  He denies any blood in stool but he is losing weight.  We took him off metformin.  He saw no significant change stopping the metformin.  I recommended that he stay off of that because of the chronic diarrhea and replace it with Jardiance however he went back on the metformin and the diarrhea has worsened.  He presents today concerned about the diarrhea.  He is also concerned about the price of Jardiance which I was using to replace the metformin.  He denies any blood in stool or melena.  He denies any fevers or chills or abdominal pain.  At that time, my plan was: Patient has chronic diarrhea.  At this point I feel that he needs to see GI to have a colonoscopy to rule out inflammatory bowel disease or other pathologic processes.  He does not have symptoms that suggest pancreatic insufficiency.  However he may have a malabsorptive process given the weight loss.  I would appreciate GIs opinion.  Also the differential diagnosis is IBS.  Given the duration of symptoms I feel it is prudent to have a colonoscopy to see.  I recommended staying off the metformin and I would recommend trying WelChol 2 tablets twice daily both to help manage his blood sugar and potentially reduce his diarrhea.  He can also use Imodium up to 4 times a day.  01/05/23  Patient went to the pharmacy.  We received a note saying that his insurance would not cover WelChol.  As a result I switched the patient to cholestyramine 4 g twice daily.  I am  doing this to treat his chronic diarrhea as well as help his sugars and directly given that I recently stopped his metformin.  Patient wind up getting a prescription both for WelChol and the cholestyramine as they called both $45.  He is here today confused because he read the product insert and mention treating cholesterol and he was confused because he thought it was for his diarrhea Past Medical History:  Diagnosis Date   Allergy    grass etc.   Barrett's esophagus    BPH (benign prostatic hyperplasia)    Chest pain, atypical    Chronic kidney disease    kidney stones   Coronary artery disease    GERD (gastroesophageal reflux disease)    Barrett's   Hernia    Hyperlipidemia    Hypertension    Myocardial infarction ([place])    Thrombocytopenia ([place])    Ulcer 1960   Past Surgical History:  Procedure Laterality Date   CARDIAC CATHETERIZATION  02/14/2007   MITRAL REGURGITATION. LV NORMAL   CHOLECYSTECTOMY     COLONOSCOPY     CORONARY ARTERY BYPASS GRAFT     twice  2 by passes each time   INGUINAL HERNIA REPAIR     bilateral done twice   LITHOTRIPSY     UPPER GASTROINTESTINAL ENDOSCOPY     Current  Outpatient Medications on File Prior to Visit  Medication Sig Dispense Refill   Accu-Chek Softclix Lancets lancets TEST BLOOD SUGAR ONE TIME DAILY 100 each 1   Alcohol Swabs (DROPSAFE ALCOHOL PREP) 70 % PADS USE TOPICALLY ONE TIME DAILY 100 each 1   amLODipine (NORVASC) 5 MG tablet TAKE 1 TABLET EVERY DAY 90 tablet 0   aspirin 81 MG tablet Take 81 mg by mouth daily.     augmented betamethasone dipropionate (DIPROLENE-AF) 0.05 % cream Apply topically.     Blood Glucose Monitoring Suppl (ACCU-CHEK GUIDE ME) w/Device KIT Use as instructed to monitor FSBS 1x daily. Dx: E11.9 1 kit 1   Cholecalciferol (VITAMIN D) 2000 units CAPS Take 1 capsule by mouth daily.     cholestyramine (QUESTRAN) 4 g packet Take 1 packet (4 g total) by mouth 2 (two) times daily. 60 each 12   clobetasol cream  (TEMOVATE) 0.05 % Apply topically.     colesevelam (WELCHOL) 625 MG tablet Take by mouth.     empagliflozin (JARDIANCE) 25 MG TABS tablet Take 1 tablet (25 mg total) by mouth daily before breakfast. 30 tablet 11   finasteride (PROSCAR) 5 MG tablet TAKE 1 TABLET(5 MG) BY MOUTH DAILY. 90 tablet 3   glucose blood (ACCU-CHEK GUIDE) test strip TEST BLOOD SUGAR ONE TIME DAILY AS DIRECTED 100 strip 0   lansoprazole (PREVACID) 30 MG capsule TAKE 1 CAPSULE EVERY DAY 90 capsule 1   metFORMIN (GLUCOPHAGE-XR) 500 MG 24 hr tablet TAKE 2 TABLETS EVERY DAY WITH BREAKFAST 180 tablet 3   metoprolol succinate (TOPROL-XL) 25 MG 24 hr tablet TAKE 1/2 TABLET EVERY DAY 45 tablet 2   Multiple Vitamin (MULTIVITAMIN) tablet Take 1 tablet by mouth daily.     nitroGLYCERIN (NITROSTAT) 0.4 MG SL tablet Dissolve 1 tablet under the tongue every 5 minutes as needed for chest pain. Max of 3 doses, then 911. 75 tablet 3   olopatadine (PATADAY) 0.1 % ophthalmic solution Place 1 drop into both eyes 2 (two) times daily. 5 mL 12   Omega-3 Fatty Acids (FISH OIL) 1000 MG CPDR Take 1,000 mg by mouth every morning.     simvastatin (ZOCOR) 10 MG tablet TAKE 1 TABLET AT BEDTIME 90 tablet 3   tamsulosin (FLOMAX) 0.4 MG CAPS capsule TAKE 1 CAPSULE EVERY DAY 90 capsule 3   tiZANidine (ZANAFLEX) 2 MG tablet Take 1 tablet (2 mg total) by mouth every 6 (six) hours as needed for muscle spasms. 30 tablet 1   No current facility-administered medications on file prior to visit.   Allergies  Allergen Reactions   Imdur [Isosorbide Mononitrate]     Unknown, per pt    Meloxicam Swelling    Facial swelling    Social History   Socioeconomic History   Marital status: Married    Spouse name: [place]   Number of children: 2   Years of education: Not on file   Highest education level: Not on file  Occupational History   Occupation: Retired  Tobacco Use   Smoking status: Never   Smokeless tobacco: Never  Vaping Use   Vaping Use: Never used   Substance and Sexual Activity   Alcohol use: No   Drug use: No   Sexual activity: Not Currently  Other Topics Concern   Not on file  Social History Narrative   Married in 1961.   6 grandchildren   Social Determinants of Health   Financial Resource Strain: Low Risk  (12/08/2022)   Overall Financial Resource  Strain (CARDIA)    Difficulty of Paying Living Expenses: Not hard at all  Food Insecurity: No Food Insecurity (12/08/2022)   Hunger Vital Sign    Worried About Running Out of Food in the Last Year: Never true    Ran Out of Food in the Last Year: Never true  Transportation Needs: No Transportation Needs (12/08/2022)   PRAPARE - Hydrologist (Medical): No    Lack of Transportation (Non-Medical): No  Physical Activity: Sufficiently Active (12/08/2022)   Exercise Vital Sign    Days of Exercise per Week: 5 days    Minutes of Exercise per Session: 30 min  Stress: No Stress Concern Present (12/08/2022)   [place]    Feeling of Stress : Not at all  Social Connections: [place] (12/08/2022)   Social Connection and Isolation Panel [NHANES]    Frequency of Communication with Friends and Family: More than three times a week    Frequency of Social Gatherings with Friends and Family: Three times a week    Attends Religious Services: More than 4 times per year    Active Member of Clubs or Organizations: Yes    Attends Archivist Meetings: More than 4 times per year    Marital Status: Married  Human resources officer Violence: Not At Risk (12/08/2022)   Humiliation, Afraid, Rape, and Kick questionnaire    Fear of Current or Ex-Partner: No    Emotionally Abused: No    Physically Abused: No    Sexually Abused: No      Review of Systems  All other systems reviewed and are negative.      Objective:   Physical Exam Vitals reviewed.  Constitutional:      Appearance: Normal  appearance.  Cardiovascular:     Rate and Rhythm: Normal rate and regular rhythm.     Heart sounds: Normal heart sounds. No murmur heard.    No friction rub. No gallop.  Pulmonary:     Effort: Pulmonary effort is normal. No respiratory distress.     Breath sounds: Normal breath sounds and air entry. No stridor, decreased air movement or transmitted upper airway sounds. No wheezing, rhonchi or rales.  Musculoskeletal:     Right lower leg: No edema.     Left lower leg: No edema.  Neurological:     Mental Status: He is alert.     7 cm erythematous ring with central clearing      Assessment & Plan:   Chronic diarrhea  Controlled type 2 diabetes mellitus with complication, without long-term current use of insulin ([place]) I explained to the patient that I am using the cholestyramine off label to help with chronic diarrhea and hoping it will help his blood sugar and cholesterol as well.  I am concerned that his blood sugar will worsen now that we have quit the metformin.  I want him to stop metformin.  However since he was able to afford the [facility] and it is the same price as the cholestyramine I recommended that he try the WelChol 2 pills twice daily.  If this helps control his sugar and his cholesterol we will continue this and discontinue cholestyramine.  If the WelChol does not help, I would then try cholestyramine instead to see if he has better success controlling diarrhea.  Continue to follow-up with GI as planned for colonoscopy to rule out inflammatory bowel disease.  Stay off of metformin as we  discussed

## 2023-01-05 NOTE — Telephone Encounter (Signed)
Pt returned call. Pt states that he prefers to be seen in the office. Pt scheduled to see Christen Bame, NP for preop clearance.

## 2023-01-05 NOTE — Telephone Encounter (Signed)
2nd attempt to reach pt to schedule tele health visit. Lvm

## 2023-01-07 ENCOUNTER — Ambulatory Visit
Admission: EM | Admit: 2023-01-07 | Discharge: 2023-01-07 | Disposition: A | Discharge status: Home / Self Care | Payer: Medicare HMO | Attending: Nurse Practitioner | Admitting: Nurse Practitioner

## 2023-01-07 DIAGNOSIS — J069 Acute upper respiratory infection, unspecified: Secondary | ICD-10-CM | POA: Diagnosis not present

## 2023-01-07 DIAGNOSIS — Z8709 Personal history of other diseases of the respiratory system: Secondary | ICD-10-CM | POA: Diagnosis not present

## 2023-01-07 DIAGNOSIS — Z1152 Encounter for screening for COVID-19: Secondary | ICD-10-CM | POA: Diagnosis not present

## 2023-01-07 LAB — POCT INFLUENZA A/B
Influenza A, POC: NEGATIVE
Influenza B, POC: NEGATIVE

## 2023-01-07 MED ORDER — CETIRIZINE HCL 10 MG PO TABS: 10.0000 mg | ORAL_TABLET | Freq: Every day | ORAL | 0 refills | Status: DC

## 2023-01-07 NOTE — ED Triage Notes (Signed)
Pt reports headache and nasal congestion x 3 days; fever 100.3 F since last night. Taking Tylenol.   Repost he has a dental infection x 3 weeks and needs antibiotic

## 2023-01-07 NOTE — ED Provider Notes (Signed)
[facility]    CSN: [id number] Arrival date & time: 01/07/23  1315      History   Chief Complaint Chief Complaint  Patient presents with   Fever    HPI Andrew [name] is a 82 y.o. male.   The history is provided by the patient.   The patient presents for complaints of headache, nasal congestion, runny nose, and fever.  Patient states symptoms started over the past, with fever starting last evening.  He states Tmax 100.3.  He states he has been taking Tylenol for his symptoms, with his last dose of Tylenol around 2:30 AM this morning.  Patient denies body aches, chills, sore throat, ear pain, cough, or GI symptoms.  Patient reports that he was working out [place] his [place] prior to his symptoms starting.  States that he also used Flonase yesterday.  Past Medical History:  Diagnosis Date   Allergy    grass etc.   Barrett's esophagus    BPH (benign prostatic hyperplasia)    Chest pain, atypical    Chronic kidney disease    kidney stones   Coronary artery disease    GERD (gastroesophageal reflux disease)    Barrett's   Hernia    Hyperlipidemia    Hypertension    Myocardial infarction ([place])    Thrombocytopenia ([place])    Ulcer 1960    Patient Active Problem List   Diagnosis Date Noted   Thrombocytopenia ([place])    Bilateral carotid artery disease ([place]) 08/04/2016   Poor posture 02/22/2014   Stiffness of joints, not elsewhere classified, multiple sites 02/22/2014   Prediabetes 01/05/2013   Special screening for malignant neoplasms, colon 04/27/2011   Benign neoplasm of colon 04/27/2011   GERD (gastroesophageal reflux disease) 04/27/2011   Diverticulosis of colon (without mention of hemorrhage) 04/27/2011   CAD (coronary artery disease) 12/27/2010   Environmental allergies 12/27/2010   Insomnia 12/27/2010   H. pylori infection 12/27/2010   Barrett's esophagus 12/27/2010   Hyperlipidemia 11/08/2008   HTN (hypertension) 11/08/2008   GERD 11/08/2008   ACUT PEPTC  ULCR UNS SITE W/HEM W/O MENTION OBST 11/08/2008   RENAL CALCULUS 11/08/2008   Obstructive sleep apnea 04/12/2003    Past Surgical History:  Procedure Laterality Date   CARDIAC CATHETERIZATION  02/14/2007   MITRAL REGURGITATION. LV NORMAL   CHOLECYSTECTOMY     COLONOSCOPY     CORONARY ARTERY BYPASS GRAFT     twice  2 by passes each time   INGUINAL HERNIA REPAIR     bilateral done twice   LITHOTRIPSY     UPPER GASTROINTESTINAL ENDOSCOPY         Home Medications    Prior to Admission medications   Medication Sig Start Date End Date Taking? Authorizing Provider  cetirizine (ZYRTEC) 10 MG tablet Take 1 tablet (10 mg total) by mouth daily. 01/07/23  Yes Jaymarion Trombly-Warren, Alda Lea, NP  clobetasol cream (TEMOVATE) [id number] % Apply 1 Application topically 2 (two) times daily.   Yes [provider]  Accu-Chek Softclix Lancets lancets TEST BLOOD SUGAR ONE TIME DAILY 04/03/22   Susy Frizzle, MD  Alcohol Swabs (DROPSAFE ALCOHOL PREP) 70 % PADS USE TOPICALLY ONE TIME DAILY 04/03/22   Susy Frizzle, MD  amLODipine (NORVASC) 5 MG tablet TAKE 1 TABLET EVERY DAY 01/04/23   Susy Frizzle, MD  aspirin 81 MG tablet Take 81 mg by mouth daily.    [provider]  augmented betamethasone dipropionate (DIPROLENE-AF) 0.05 % cream Apply  topically. 09/01/22   [provider]  Blood Glucose Monitoring Suppl (ACCU-CHEK GUIDE ME) w/Device KIT Use as instructed to monitor FSBS 1x daily. Dx: E11.9 07/15/21   Susy Frizzle, MD  Cholecalciferol (VITAMIN D) 2000 units CAPS Take 1 capsule by mouth daily.    [provider]  cholestyramine (QUESTRAN) 4 g packet Take 1 packet (4 g total) by mouth 2 (two) times daily. 01/01/23   Susy Frizzle, MD  clobetasol cream (TEMOVATE) 0.05 % Apply topically. 10/19/22   [provider]  colesevelam [facility]) 625 MG tablet Take by mouth. 01/01/23   [provider]  empagliflozin (JARDIANCE) 25 MG TABS tablet Take 1 tablet  (25 mg total) by mouth daily before breakfast. 12/17/22   Susy Frizzle, MD  finasteride (PROSCAR) 5 MG tablet TAKE 1 TABLET(5 MG) BY MOUTH DAILY. 10/22/20   Susy Frizzle, MD  glucose blood (ACCU-CHEK GUIDE) test strip TEST BLOOD SUGAR ONE TIME DAILY AS DIRECTED 04/03/22   Susy Frizzle, MD  lansoprazole (PREVACID) 30 MG capsule TAKE 1 CAPSULE EVERY DAY 06/18/22   Susy Frizzle, MD  metFORMIN (GLUCOPHAGE-XR) 500 MG 24 hr tablet TAKE 2 TABLETS EVERY DAY WITH BREAKFAST 11/04/22   Susy Frizzle, MD  metoprolol succinate (TOPROL-XL) 25 MG 24 hr tablet TAKE 1/2 TABLET EVERY DAY 04/03/22   Susy Frizzle, MD  Multiple Vitamin (MULTIVITAMIN) tablet Take 1 tablet by mouth daily.    [provider]  nitroGLYCERIN (NITROSTAT) 0.4 MG SL tablet Dissolve 1 tablet under the tongue every 5 minutes as needed for chest pain. Max of 3 doses, then 911. 06/02/22   Nahser, Wonda Cheng, MD  olopatadine (PATADAY) 0.1 % ophthalmic solution Place 1 drop into both eyes 2 (two) times daily. 01/15/22   Susy Frizzle, MD  Omega-3 Fatty Acids (FISH OIL) 1000 MG CPDR Take 1,000 mg by mouth every morning.    [provider]  simvastatin (ZOCOR) 10 MG tablet TAKE 1 TABLET AT BEDTIME 03/30/22   Susy Frizzle, MD  tamsulosin (FLOMAX) 0.4 MG CAPS capsule TAKE 1 CAPSULE EVERY DAY 10/15/22   Susy Frizzle, MD  tiZANidine (ZANAFLEX) 2 MG tablet Take 1 tablet (2 mg total) by mouth every 6 (six) hours as needed for muscle spasms. 04/28/22   Susy Frizzle, MD    Family History Family History  Problem Relation Age of Onset   Stroke Father    Heart attack Mother    Heart attack Maternal Grandmother    Heart attack Maternal Grandfather    Heart disease Sister    Heart disease Brother    Heart disease Sister    Heart disease Brother    Prostate cancer Brother    Colon cancer Neg Hx    Esophageal cancer Neg Hx    Rectal cancer Neg Hx    Stomach cancer Neg Hx     Social History Social  History   Tobacco Use   Smoking status: Never   Smokeless tobacco: Never  Vaping Use   Vaping Use: Never used  Substance Use Topics   Alcohol use: No   Drug use: No     Allergies   Imdur [isosorbide mononitrate] and Meloxicam   Review of Systems Review of Systems Per HPI  Physical Exam Triage Vital Signs ED Triage Vitals  Enc Vitals Group     BP 01/07/23 1335 134/68     Pulse Rate 01/07/23 1335 86     Resp 01/07/23 1335  18     Temp 01/07/23 1335 99.7 F (37.6 C)     Temp Source 01/07/23 1335 Oral     SpO2 01/07/23 1335 96 %     Weight --      Height --      Head Circumference --      Peak Flow --      Pain Score 01/07/23 1340 0     Pain Loc --      Pain Edu? --      Excl. in [place]? --    No data found.  Updated Vital Signs BP 134/68 (BP Location: Right Arm)   Pulse 86   Temp 99.7 F (37.6 C) (Oral)   Resp 18   SpO2 96%   Visual Acuity Right Eye Distance:   Left Eye Distance:   Bilateral Distance:    Right Eye Near:   Left Eye Near:    Bilateral Near:     Physical Exam Vitals and nursing note reviewed.  Constitutional:      General: He is not in acute distress.    Appearance: Normal appearance.  HENT:     Head: Normocephalic.     Right Ear: Tympanic membrane, ear canal and external ear normal.     Left Ear: Tympanic membrane, ear canal and external ear normal.     Nose: Rhinorrhea present.     Mouth/Throat:     Mouth: Mucous membranes are moist.     Pharynx: Posterior oropharyngeal erythema present.     Comments: Cobblestoning present on posterior oropharynx Eyes:     Extraocular Movements: Extraocular movements intact.     Conjunctiva/sclera: Conjunctivae normal.     Pupils: Pupils are equal, round, and reactive to light.  Cardiovascular:     Rate and Rhythm: Regular rhythm.     Pulses: Normal pulses.     Heart sounds: Normal heart sounds.  Pulmonary:     Effort: Pulmonary effort is normal. No respiratory distress.     Breath sounds:  Normal breath sounds. No stridor. No wheezing, rhonchi or rales.  Abdominal:     General: Bowel sounds are normal.     Palpations: Abdomen is soft.     Tenderness: There is no abdominal tenderness.  Musculoskeletal:     Cervical back: Normal range of motion.  Lymphadenopathy:     Cervical: No cervical adenopathy.  Skin:    General: Skin is warm and dry.  Neurological:     General: No focal deficit present.     Mental Status: He is alert and oriented to person, place, and time.  Psychiatric:        Mood and Affect: Mood normal.        Behavior: Behavior normal.      UC Treatments / Results  Labs (all labs ordered are listed, but only abnormal results are displayed) Labs Reviewed  SARS CORONAVIRUS 2 (TAT 6-24 HRS)  POCT INFLUENZA A/B    EKG   Radiology No results found.  Procedures Procedures (including critical care time)  Medications Ordered in UC Medications - No data to display  Initial Impression / Assessment and Plan / UC Course  I have reviewed the triage vital signs and the nursing notes.  Pertinent labs & imaging results that were available during my care of the patient were reviewed by me and considered in my medical decision making (see chart for details).  The patient is well-appearing, he is in no acute distress, vital signs are stable.  Influenza test is negative.  COVID test is pending.  Patient is a candidate to receive molnupiravir if his COVID test is positive.  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  Other differential diagnoses includes viral upper respiratory infection.  Will treat patient with cetirizine 10 mg daily, and have him continue Flonase that he is currently taking.  Supportive care recommendations were provided to the patient along with indications of when follow-up may be necessary.  Patient is in agreement with this plan of care and verbalizes understanding.  All questions were answered.   Patient stable for discharge.  Final Clinical Impressions(s) / UC Diagnoses   Final diagnoses:  Encounter for screening for COVID-19  Viral upper respiratory infection  History of allergic rhinitis     Discharge Instructions      The influenza test was negative. A COVID test is pending. You will be contacted if the test is positive. You will be able to begin molnupiravir for treatment.  Take medication as prescribed.  Continue using the Flonase daily while symptoms persist. May also use normal saline nasal spray throughout the day to help with nasal congestion and runny nose. If you develop a cough, recommend using a humidifier in your bedroom at nighttime during sleep and sleeping elevated on pillows. If your symptoms worsen over the next several days, please follow-up in this clinic or with your primary care physician for further evaluation. Follow-up as needed.     ED Prescriptions     Medication Sig Dispense Auth. Provider   cetirizine (ZYRTEC) 10 MG tablet Take 1 tablet (10 mg total) by mouth daily. 30 tablet Ha Placeres-Warren, Alda Lea, NP      PDMP not reviewed this encounter.   Tish Men, NP 01/07/23 1407

## 2023-01-07 NOTE — Discharge Instructions (Addendum)
The influenza test was negative. A COVID test is pending. You will be contacted if the test is positive. You will be able to begin molnupiravir for treatment.  Take medication as prescribed.  Continue using the Flonase daily while symptoms persist. May also use normal saline nasal spray throughout the day to help with nasal congestion and runny nose. If you develop a cough, recommend using a humidifier in your bedroom at nighttime during sleep and sleeping elevated on pillows. If your symptoms worsen over the next several days, please follow-up in this clinic or with your primary care physician for further evaluation. Follow-up as needed.

## 2023-01-08 ENCOUNTER — Telehealth: Payer: Self-pay | Admitting: Emergency Medicine

## 2023-01-08 LAB — SARS CORONAVIRUS 2 (TAT 6-24 HRS): SARS Coronavirus 2: POSITIVE — AB

## 2023-01-08 NOTE — Telephone Encounter (Signed)
Pt called and inquired if rest needed to take place in bed and if could still continue to use treadmill. Discussed with pt the need to take it easy for the next few days but could continue activity as tolerated. Pt verbalized understanding and denied any further questions.

## 2023-01-11 NOTE — Progress Notes (Signed)
Cardiology Office Note:    Date:  01/15/2023   ID:  Andrew [name], DOB [birth year], MRN [id number]  PCP:  Andrew Brooks, MD   [facility] Providers Cardiologist:  Andrew Miss, MD     Referring MD: Andrew Brooks, MD   Chief Complaint: preoperative cardiac evaluation  History of Present Illness:    Andrew [name] is a very pleasant 82 y.o. male with a hx of CAD s/p CABG with redo CABG  dyslipidemia, HTN, carotid artery disease, type 2 DM,   CABG 1997 following angioplasty with urgent bypass. Recurrent angina with cardiac catheterization Feb 14, 2007 with progressive CAD with disease of LAD distal to mammary artery graft disease of OM1. Vein graft previously placed to anterolateral branch of LAD was widely patent. Redo CABG 03/08/2007 with left raidal artery to distal LAD and SVG to obtuse marginal #1. LIMA atretic and does not supply any significant flow to LAD, LCx has minor luminal irregularities, LCx is dominant.   He raises black angus cows. Has maintained consistent follow-up with Dr. Elease [name]. Last cardiology clinic visit was 06/02/22 with Dr. Elease [name] at which time he was overall doing well. Walking on treadmill occasionally with no symptoms of angina. He was deemed low risk for upcoming colonoscopy and was cleared to hold aspirin.  He was referred to vascular surgery by PCP for evaluation of leg claudication. Noninvasive studies 10/23/22 revealed normal ankle arm index and normal triphasic waveforms in feet bilaterally. No evidence of aortic iliac occlusive disease. History of moderate calcification at origin of celiac, SMA and inferior mesenteric artery. Advised that no further vascular studies warranted.   Today, he is here for preoperative cardiac evaluation for dental extraction of 4 teeth. Also has colonoscopy pending. Exercises 18-20 minutes each morning walking on treadmill. Also active around his home and has cows that he tends in the summer months. He denies chest pain,  shortness of breath, lower extremity edema, fatigue, palpitations, melena, hematuria, hemoptysis, diaphoresis, weakness, presyncope, syncope, orthopnea, and PND.  Is frustrated with 25 lb weight loss trying to keep his blood sugar well controlled. Fasting blood sugar 150s recently. Did not tolerate metformin 2/2 diarrhea.    Past Medical History:  Diagnosis Date   Allergy    grass etc.   Barrett's esophagus    BPH (benign prostatic hyperplasia)    Chest pain, atypical    Chronic kidney disease    kidney stones   Coronary artery disease    GERD (gastroesophageal reflux disease)    Barrett's   Hernia    Hyperlipidemia    Hypertension    Myocardial infarction    Thrombocytopenia    Ulcer 1960    Past Surgical History:  Procedure Laterality Date   CARDIAC CATHETERIZATION  02/14/2007   MITRAL REGURGITATION. LV NORMAL   CHOLECYSTECTOMY     COLONOSCOPY     CORONARY ARTERY BYPASS GRAFT     twice  2 by passes each time   INGUINAL HERNIA REPAIR     bilateral done twice   LITHOTRIPSY     UPPER GASTROINTESTINAL ENDOSCOPY      Current Medications: Current Meds  Medication Sig   Accu-Chek Softclix Lancets lancets TEST BLOOD SUGAR ONE TIME DAILY   Alcohol Swabs (DROPSAFE ALCOHOL PREP) 70 % PADS USE TOPICALLY ONE TIME DAILY   amLODipine (NORVASC) 5 MG tablet TAKE 1 TABLET EVERY DAY   aspirin 81 MG tablet Take 81 mg by mouth daily.   augmented betamethasone dipropionate (DIPROLENE-AF)  0.05 % cream Apply topically.   Blood Glucose Monitoring Suppl (ACCU-CHEK GUIDE ME) w/Device KIT Use as instructed to monitor FSBS 1x daily. Dx: E11.9   cetirizine (ZYRTEC) 10 MG tablet Take 1 tablet (10 mg total) by mouth daily.   Cholecalciferol (VITAMIN D) 2000 units CAPS Take 1 capsule by mouth daily.   cholestyramine (QUESTRAN) 4 g packet Take 1 packet (4 g total) by mouth 2 (two) times daily.   clobetasol cream (TEMOVATE) 0.05 % Apply 1 Application topically 2 (two) times daily.   colesevelam  (WELCHOL) 625 MG tablet Take by mouth.   empagliflozin (JARDIANCE) 25 MG TABS tablet Take 1 tablet (25 mg total) by mouth daily before breakfast.   finasteride (PROSCAR) 5 MG tablet TAKE 1 TABLET(5 MG) BY MOUTH DAILY.   glucose blood (ACCU-CHEK GUIDE) test strip TEST BLOOD SUGAR ONE TIME DAILY AS DIRECTED   lansoprazole (PREVACID) 30 MG capsule TAKE 1 CAPSULE EVERY DAY   metoprolol succinate (TOPROL-XL) 25 MG 24 hr tablet TAKE 1/2 TABLET EVERY DAY   Multiple Vitamin (MULTIVITAMIN) tablet Take 1 tablet by mouth daily.   nitroGLYCERIN (NITROSTAT) 0.4 MG SL tablet Dissolve 1 tablet under the tongue every 5 minutes as needed for chest pain. Max of 3 doses, then 911.   olopatadine (PATADAY) 0.1 % ophthalmic solution Place 1 drop into both eyes 2 (two) times daily.   Omega-3 Fatty Acids (FISH OIL) 1000 MG CPDR Take 1,000 mg by mouth every morning.   simvastatin (ZOCOR) 10 MG tablet TAKE 1 TABLET AT BEDTIME   tamsulosin (FLOMAX) 0.4 MG CAPS capsule TAKE 1 CAPSULE EVERY DAY   tiZANidine (ZANAFLEX) 2 MG tablet Take 1 tablet (2 mg total) by mouth every 6 (six) hours as needed for muscle spasms.     Allergies:   Imdur [isosorbide mononitrate] and Meloxicam   Social History   Socioeconomic History   Marital status: Married    Spouse name: Mary   Number of children: 2   Years of education: Not on file   Highest education level: Not on file  Occupational History   Occupation: Retired  Tobacco Use   Smoking status: Never   Smokeless tobacco: Never  Vaping Use   Vaping Use: Never used  Substance and Sexual Activity   Alcohol use: No   Drug use: No   Sexual activity: Not Currently  Other Topics Concern   Not on file  Social History Narrative   Married in 1961.   6 grandchildren   Social Determinants of Health   Financial Resource Strain: Low Risk  (12/08/2022)   Overall Financial Resource Strain (CARDIA)    Difficulty of Paying Living Expenses: Not hard at all  Food Insecurity: No Food  Insecurity (12/08/2022)   Hunger Vital Sign    Worried About Running Out of Food in the Last Year: Never true    Ran Out of Food in the Last Year: Never true  Transportation Needs: No Transportation Needs (12/08/2022)   PRAPARE - Administrator, Civil Service (Medical): No    Lack of Transportation (Non-Medical): No  Physical Activity: Sufficiently Active (12/08/2022)   Exercise Vital Sign    Days of Exercise per Week: 5 days    Minutes of Exercise per Session: 30 min  Stress: No Stress Concern Present (12/08/2022)   [employer] of Occupational Health - Occupational Stress Questionnaire    Feeling of Stress : Not at all  Social Connections: Socially Integrated (12/08/2022)   Social Connection and Isolation  Panel [NHANES]    Frequency of Communication with Friends and Family: More than three times a week    Frequency of Social Gatherings with Friends and Family: Three times a week    Attends Religious Services: More than 4 times per year    Active Member of Clubs or Organizations: Yes    Attends Engineer, structural: More than 4 times per year    Marital Status: Married     Family History: The patient's family history includes Heart attack in his maternal grandfather, maternal grandmother, and mother; Heart disease in his brother, brother, sister, and sister; Prostate cancer in his brother; Stroke in his father. There is no history of Colon cancer, Esophageal cancer, Rectal cancer, or Stomach cancer.  ROS:   Please see the history of present illness.   All other systems reviewed and are negative.  Labs/Other Studies Reviewed:    The following studies were reviewed today:  ABI 10/24/22 Normal ABIs  Carotid Duplex 05/2019 Right carotid near-normal with only minimal plaque Left carotid 1-39% stenosis  Echo 02/2010 Normal LV systolic function, normal diastolic function Mild aortic calcification, no stenosis Mild left and right atrial enlargement Trace MR,  TR  Recent Labs: 05/08/2022: TSH 1.88 12/17/2022: ALT 17; BUN 18; Creat 0.80; Hemoglobin 13.6; Platelets 70; Potassium 4.2; Sodium 142  Recent Lipid Panel    Component Value Date/Time   CHOL 110 12/17/2022 0835   CHOL 99 (L) 06/02/2022 1100   TRIG 51 12/17/2022 0835   HDL 45 12/17/2022 0835   HDL 40 06/02/2022 1100   CHOLHDL 2.4 12/17/2022 0835   VLDL 16 02/10/2017 0811   LDLCALC 52 12/17/2022 0835     Risk Assessment/Calculations:       Physical Exam:    VS:  BP 122/66   Pulse 73   Ht 5\' 8"  (1.727 m)   Wt 144 lb (65.3 kg)   SpO2 99%   BMI 21.90 kg/m     Wt Readings from Last 3 Encounters:  01/15/23 144 lb (65.3 kg)  01/12/23 145 lb 12.8 oz (66.1 kg)  01/05/23 145 lb (65.8 kg)     GEN:  Well nourished, well developed in no acute distress HEENT: Normal NECK: No JVD; No carotid bruits CARDIAC: RRR, no murmurs, rubs, gallops RESPIRATORY:  Clear to auscultation without rales, wheezing or rhonchi  ABDOMEN: Soft, non-tender, non-distended MUSCULOSKELETAL:  No edema; No deformity. 2+ pedal pulses, equal bilaterally SKIN: Warm and dry NEUROLOGIC:  Alert and oriented x 3 PSYCHIATRIC:  Normal affect   EKG:  EKG is ordered today.  The ekg ordered today demonstrates normal sinus rhythm at 73 bpm, incomplete RBBB, nonspecific ST abnormality, no acute change from previous tracing   Diagnoses:    1. Preoperative cardiovascular examination   2. Coronary artery disease involving native coronary artery of native heart without angina pectoris   3. Essential hypertension   4. Hyperlipidemia LDL goal <70   5. Hx of CABG   6. Type 2 diabetes mellitus with complication, without long-term current use of insulin    Assessment and Plan:     Preoperative cardiac evaluation: According to the Revised Cardiac Risk Index (RCRI), his Perioperative Risk of Major Cardiac Event is (%): 0.9. His Functional Capacity in METs is: 6.61 according to the Duke Activity Status Index (DASI). The  patient is doing well from a cardiac perspective. Therefore, based on ACC/AHA guidelines, the patient would be at acceptable risk for the planned procedure without further cardiovascular testing. Per  office protocol, he may hold aspirin for 5-7 days prior to procedure and should resume as soon as hemodynamically stable postoperatively. He is concerned about holding aspirin. He has tolerated this well in the past. Reviewed how we calculate risk with him and encouraged him to notify us if he has any concerning symptoms prior to next office visit.  I will forward this clearance to Dr. Egbert Garibaldi, requesting provider.   CAD without angina: s/p CABG 2008. 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 No change in activity tolerance. He denies chest pain, dyspnea, or other symptoms concerning for angina. No indication for further ischemic evaluation at this time. No bleeding concerns.  Continue GDMT including aspirin, simvastatin, empagliflozin, metoprolol, amlodipine.   Hyperlipidemia LDL goal < 55: LDL 52 on 12/17/22. At goal. Continue simvastatin.  Hypertension: BP is well controlled on current therapy. Renal function and electrolytes stable on lab work 12/17/2022.  Diabetes: Lengthy conversation about diet as he is frustrated by 25 lb weight loss trying to keep his blood sugar well controlled. A1C 6.5 on 12/17/22. Sounds like he is eating a lot of sugar free snacks. Encouraged whole food diet with fresh fruit and vegetables in the place of processed foods. Provided information for him to review. Management per PCP.      Disposition: 6 months with Dr. Elease [name]  Medication Adjustments/Labs and Tests Ordered: Current medicines are reviewed at length with the patient today.  Concerns regarding medicines are outlined above.  Orders Placed This Encounter  Procedures   EKG 12-Lead   No orders of the defined types were placed in this encounter.   Patient Instructions   Medication Instructions:   Your physician recommends that you continue on your current medications as directed. Please refer to the Current Medication list given to you today.   *If you need a refill on your cardiac medications before your next appointment, please call your pharmacy*   Lab Work:  None ordered.  If you have labs (blood work) drawn today and your tests are completely normal, you will receive your results only by: MyChart Message (if you have MyChart) OR A paper copy in the mail If you have any lab test that is abnormal or we need to change your treatment, we will call you to review the results.   Testing/Procedures:  None ordered.   Follow-Up: At [facility], you and your health needs are our priority.  As part of our continuing mission to provide you with exceptional heart care, we have created designated Provider Care Teams.  These Care Teams include your primary Cardiologist (physician) and Advanced Practice Providers (APPs -  Physician Assistants and Nurse Practitioners) who all work together to provide you with the care you need, when you need it.  We recommend signing up for the patient portal called "MyChart".  Sign up information is provided on this After Visit Summary.  MyChart is used to connect with patients for Virtual Visits (Telemedicine).  Patients are able to view lab/test results, encounter notes, upcoming appointments, etc.  Non-urgent messages can be sent to your provider as well.   To learn more about what you can do with MyChart, go to [email or web address].    Your next appointment:   5 month(s)  Provider:   Kristeen Miss, MD     Other Instructions  Mediterranean Diet A Mediterranean diet refers to food and lifestyle choices that are based on the traditions of countries located on the  [place]. It focuses on eating more fruits, vegetables, whole grains, beans, nuts, seeds, and heart-healthy fats, and eating less dairy,  meat, eggs, and processed foods with added sugar, salt, and fat. This way of eating has been shown to help prevent certain conditions and improve outcomes for people who have chronic diseases, like kidney disease and heart disease. What are tips for following this plan? Reading food labels Check the serving size of packaged foods. For foods such as rice and pasta, the serving size refers to the amount of cooked product, not dry. Check the total fat in packaged foods. Avoid foods that have saturated fat or trans fats. Check the ingredient list for added sugars, such as corn syrup. Shopping  Buy a variety of foods that offer a balanced diet, including: Fresh fruits and vegetables (produce). Grains, beans, nuts, and seeds. Some of these may be available in unpackaged forms or large amounts (in bulk). Fresh seafood. Poultry and eggs. Low-fat dairy products. Buy whole ingredients instead of prepackaged foods. Buy fresh fruits and vegetables in-season from local farmers markets. Buy plain frozen fruits and vegetables. If you do not have access to quality fresh seafood, buy precooked frozen shrimp or canned fish, such as tuna, salmon, or sardines. Stock your pantry so you always have certain foods on hand, such as olive oil, canned tuna, canned tomatoes, rice, pasta, and beans. Cooking Cook foods with extra-virgin olive oil instead of using butter or other vegetable oils. Have meat as a side dish, and have vegetables or grains as your main dish. This means having meat in small portions or adding small amounts of meat to foods like pasta or stew. Use beans or vegetables instead of meat in common dishes like chili or lasagna. Experiment with different cooking methods. Try roasting, broiling, steaming, and sauting vegetables. Add frozen vegetables to soups, stews, pasta, or rice. Add nuts or seeds for added healthy fats and plant protein at each meal. You can add these to yogurt, salads, or vegetable  dishes. Marinate fish or vegetables using olive oil, lemon juice, garlic, and fresh herbs. Meal planning Plan to eat one vegetarian meal one day each week. Try to work up to two vegetarian meals, if possible. Eat seafood two or more times a week. Have healthy snacks readily available, such as: Vegetable sticks with hummus. Greek yogurt. Fruit and nut trail mix. Eat balanced meals throughout the week. This includes: Fruit: 2-3 servings a day. Vegetables: 4-5 servings a day. Low-fat dairy: 2 servings a day. Fish, poultry, or lean meat: 1 serving a day. Beans and legumes: 2 or more servings a week. Nuts and seeds: 1-2 servings a day. Whole grains: 6-8 servings a day. Extra-virgin olive oil: 3-4 servings a day. Limit red meat and sweets to only a few servings a month. Lifestyle  Cook and eat meals together with your family, when possible. Drink enough fluid to keep your urine pale yellow. Be physically active every day. This includes: Aerobic exercise like running or swimming. Leisure activities like gardening, walking, or housework. Get 7-8 hours of sleep each night. If recommended by your health care provider, drink red wine in moderation. This means 1 glass a day for nonpregnant women and 2 glasses a day for men. A glass of wine equals 5 oz (150 mL). What foods should I eat? Fruits Apples. Apricots. Avocado. Berries. Bananas. Cherries. Dates. Figs. Grapes. Lemons. Melon. Oranges. Peaches. Plums. Pomegranate. Vegetables Artichokes. Beets. Broccoli. Cabbage. Carrots. Eggplant. Green beans. Chard. Kale. Spinach. Onions.  Leeks. Peas. Squash. Tomatoes. Peppers. Radishes. Grains Whole-grain pasta. Brown rice. Bulgur wheat. Polenta. Couscous. Whole-wheat bread. Orpah Cobb. Meats and other proteins Beans. Almonds. Sunflower seeds. Pine nuts. Peanuts. Cod. Salmon. Scallops. Shrimp. Tuna. Tilapia. Clams. Oysters. Eggs. Poultry without skin. Dairy Low-fat milk. Cheese. Greek  yogurt. Fats and oils Extra-virgin olive oil. Avocado oil. Grapeseed oil. Beverages Water. Red wine. Herbal tea. Sweets and desserts Greek yogurt with honey. Baked apples. Poached pears. Trail mix. Seasonings and condiments Basil. Cilantro. Coriander. Cumin. Mint. Parsley. Sage. Rosemary. Tarragon. Garlic. Oregano. Thyme. Pepper. Balsamic vinegar. Tahini. Hummus. Tomato sauce. Olives. Mushrooms. The items listed above may not be a complete list of foods and beverages you can eat. Contact a dietitian for more information. What foods should I limit? This is a list of foods that should be eaten rarely or only on special occasions. Fruits Fruit canned in syrup. Vegetables Deep-fried potatoes (french fries). Grains Prepackaged pasta or rice dishes. Prepackaged cereal with added sugar. Prepackaged snacks with added sugar. Meats and other proteins Beef. Pork. Lamb. Poultry with skin. Hot dogs. Tomasa Blase. Dairy Ice cream. Sour cream. Whole milk. Fats and oils Butter. Canola oil. Vegetable oil. Beef fat (tallow). Lard. Beverages Juice. Sugar-sweetened soft drinks. Beer. Liquor and spirits. Sweets and desserts Cookies. Cakes. Pies. Candy. Seasonings and condiments Mayonnaise. Pre-made sauces and marinades. The items listed above may not be a complete list of foods and beverages you should limit. Contact a dietitian for more information. Summary The Mediterranean diet includes both food and lifestyle choices. Eat a variety of fresh fruits and vegetables, beans, nuts, seeds, and whole grains. Limit the amount of red meat and sweets that you eat. If recommended by your health care provider, drink red wine in moderation. This means 1 glass a day for nonpregnant women and 2 glasses a day for men. A glass of wine equals 5 oz (150 mL). This information is not intended to replace advice given to you by your health care provider. Make sure you discuss any questions you have with your health care  provider. Document Revised: 11/03/2019 Document Reviewed: 08/31/2019 Elsevier Patient Education  2023 Elsevier Inc. Adopting a Healthy Lifestyle.   Weight: Know what a healthy weight is for you (roughly BMI <25) and aim to maintain this. You can calculate your body mass index on your smart phone  Diet: Aim for 7+ servings of fruits and vegetables daily Limit animal fats in diet for cholesterol and heart health - choose grass fed whenever available Avoid highly processed foods (fast food burgers, tacos, fried chicken, pizza, hot dogs, french fries)  Saturated fat comes in the form of butter, lard, coconut oil, margarine, partially hydrogenated oils, and fat in meat. These increase your risk of cardiovascular disease.  Use healthy plant oils, such as olive, canola, soy, corn, sunflower and peanut.  Whole foods such as fruits, vegetables and whole grains have fiber  Men need > 38 grams of fiber per day Women need > 25 grams of fiber per day  Load up on vegetables and fruits - one-half of your plate: Aim for color and variety, and remember that potatoes dont count. Go for whole grains - one-quarter of your plate: Whole wheat, barley, wheat berries, quinoa, oats, brown rice, and foods made with them. If you want pasta, go with whole wheat pasta. Protein power - one-quarter of your plate: Fish, chicken, beans, and nuts are all healthy, versatile protein sources. Limit red meat. You need carbohydrates for energy! The type of carbohydrate is  more important than the amount. Choose carbohydrates such as vegetables, fruits, whole grains, beans, and nuts in the place of white rice, white pasta, potatoes (baked or fried), macaroni and cheese, cakes, cookies, and donuts.  If youre thirsty, drink water. Coffee and tea are good in moderation, but skip sugary drinks and limit milk and dairy products to one or two daily servings. Keep sugar intake at 6 teaspoons or 24 grams or LESS       Exercise: Aim for  150 min of moderate intensity exercise weekly for heart health, and weights twice weekly for bone health Stay active - any steps are better than no steps! Aim for 7-9 hours of sleep daily          Signed, Levi Aland, NP  01/15/2023 12:51 PM    [facility]

## 2023-01-12 ENCOUNTER — Encounter: Payer: Self-pay | Admitting: Family Medicine

## 2023-01-12 ENCOUNTER — Ambulatory Visit (INDEPENDENT_AMBULATORY_CARE_PROVIDER_SITE_OTHER): Payer: Medicare HMO | Admitting: Family Medicine

## 2023-01-12 VITALS — BP 112/52 | HR 72 | Temp 97.9°F | Ht 68.0 in | Wt 145.8 lb

## 2023-01-12 DIAGNOSIS — U071 COVID-19: Secondary | ICD-10-CM | POA: Diagnosis not present

## 2023-01-12 NOTE — Progress Notes (Signed)
Subjective:    Patient ID: Andrew [name], male    DOB: December 21, 1940, 82 y.o.   MRN: [id number] Patient was diagnosed with COVID 1 week ago.  He denies any fever.  He denies any chills.  He wants to have his lungs checked for pneumonia.  He denies any cough he denies any shortness of breath.  He denies any pleurisy or hemoptysis or purulent sputum.  He continues to have diarrhea.  We have discussed this numerous times in the past and he has an appointment Friday to meet with a gastroenterologist.  I recommended deferring further workup to his gastroenterologist as I feel he needs a workup for inflammatory bowel disease and a colonoscopy.  However, otherwise he is doing well. Past Medical History:  Diagnosis Date   Allergy    grass etc.   Barrett's esophagus    BPH (benign prostatic hyperplasia)    Chest pain, atypical    Chronic kidney disease    kidney stones   Coronary artery disease    GERD (gastroesophageal reflux disease)    Barrett's   Hernia    Hyperlipidemia    Hypertension    Myocardial infarction    Thrombocytopenia    Ulcer 1960   Past Surgical History:  Procedure Laterality Date   CARDIAC CATHETERIZATION  02/14/2007   MITRAL REGURGITATION. LV NORMAL   CHOLECYSTECTOMY     COLONOSCOPY     CORONARY ARTERY BYPASS GRAFT     twice  2 by passes each time   INGUINAL HERNIA REPAIR     bilateral done twice   LITHOTRIPSY     UPPER GASTROINTESTINAL ENDOSCOPY     Current Outpatient Medications on File Prior to Visit  Medication Sig Dispense Refill   Accu-Chek Softclix Lancets lancets TEST BLOOD SUGAR ONE TIME DAILY 100 each 1   Alcohol Swabs (DROPSAFE ALCOHOL PREP) 70 % PADS USE TOPICALLY ONE TIME DAILY 100 each 1   amLODipine (NORVASC) 5 MG tablet TAKE 1 TABLET EVERY DAY 90 tablet 0   aspirin 81 MG tablet Take 81 mg by mouth daily.     augmented betamethasone dipropionate (DIPROLENE-AF) 0.05 % cream Apply topically.     Blood Glucose Monitoring Suppl (ACCU-CHEK GUIDE ME)  w/Device KIT Use as instructed to monitor FSBS 1x daily. Dx: E11.9 1 kit 1   cetirizine (ZYRTEC) 10 MG tablet Take 1 tablet (10 mg total) by mouth daily. 30 tablet 0   Cholecalciferol (VITAMIN D) 2000 units CAPS Take 1 capsule by mouth daily.     cholestyramine (QUESTRAN) 4 g packet Take 1 packet (4 g total) by mouth 2 (two) times daily. 60 each 12   clobetasol cream (TEMOVATE) 0.05 % Apply topically.     clobetasol cream (TEMOVATE) [id number] % Apply 1 Application topically 2 (two) times daily.     colesevelam (WELCHOL) 625 MG tablet Take by mouth.     empagliflozin (JARDIANCE) 25 MG TABS tablet Take 1 tablet (25 mg total) by mouth daily before breakfast. 30 tablet 11   finasteride (PROSCAR) 5 MG tablet TAKE 1 TABLET(5 MG) BY MOUTH DAILY. 90 tablet 3   glucose blood (ACCU-CHEK GUIDE) test strip TEST BLOOD SUGAR ONE TIME DAILY AS DIRECTED 100 strip 0   lansoprazole (PREVACID) 30 MG capsule TAKE 1 CAPSULE EVERY DAY 90 capsule 1   metFORMIN (GLUCOPHAGE-XR) 500 MG 24 hr tablet TAKE 2 TABLETS EVERY DAY WITH BREAKFAST 180 tablet 3   metoprolol succinate (TOPROL-XL) 25 MG 24 hr tablet TAKE 1/2 TABLET  EVERY DAY 45 tablet 2   Multiple Vitamin (MULTIVITAMIN) tablet Take 1 tablet by mouth daily.     nitroGLYCERIN (NITROSTAT) 0.4 MG SL tablet Dissolve 1 tablet under the tongue every 5 minutes as needed for chest pain. Max of 3 doses, then 911. 75 tablet 3   olopatadine (PATADAY) 0.1 % ophthalmic solution Place 1 drop into both eyes 2 (two) times daily. 5 mL 12   Omega-3 Fatty Acids (FISH OIL) 1000 MG CPDR Take 1,000 mg by mouth every morning.     simvastatin (ZOCOR) 10 MG tablet TAKE 1 TABLET AT BEDTIME 90 tablet 3   tamsulosin (FLOMAX) 0.4 MG CAPS capsule TAKE 1 CAPSULE EVERY DAY 90 capsule 3   tiZANidine (ZANAFLEX) 2 MG tablet Take 1 tablet (2 mg total) by mouth every 6 (six) hours as needed for muscle spasms. 30 tablet 1   No current facility-administered medications on file prior to visit.   Allergies   Allergen Reactions   Imdur [Isosorbide Mononitrate]     Unknown, per pt    Meloxicam Swelling    Facial swelling    Social History   Socioeconomic History   Marital status: Married    Spouse name: Wadsworth   Number of children: 2   Years of education: Not on file   Highest education level: Not on file  Occupational History   Occupation: Retired  Tobacco Use   Smoking status: Never   Smokeless tobacco: Never  Vaping Use   Vaping Use: Never used  Substance and Sexual Activity   Alcohol use: No   Drug use: No   Sexual activity: Not Currently  Other Topics Concern   Not on file  Social History Narrative   Married in 1961.   6 grandchildren   Social Determinants of Health   Financial Resource Strain: Low Risk  (12/08/2022)   Overall Financial Resource Strain (CARDIA)    Difficulty of Paying Living Expenses: Not hard at all  Food Insecurity: No Food Insecurity (12/08/2022)   Hunger Vital Sign    Worried About Running Out of Food in the Last Year: Never true    [place] in the Last Year: Never true  Transportation Needs: No Transportation Needs (12/08/2022)   PRAPARE - Hydrologist (Medical): No    Lack of Transportation (Non-Medical): No  Physical Activity: Sufficiently Active (12/08/2022)   Exercise Vital Sign    Days of Exercise per Week: 5 days    Minutes of Exercise per Session: 30 min  Stress: No Stress Concern Present (12/08/2022)   [place]    Feeling of Stress : Not at all  Social Connections: [place] (12/08/2022)   Social Connection and Isolation Panel [NHANES]    Frequency of Communication with Friends and Family: More than three times a week    Frequency of Social Gatherings with Friends and Family: Three times a week    Attends Religious Services: More than 4 times per year    Active Member of Clubs or Organizations: Yes    Attends Theatre manager Meetings: More than 4 times per year    Marital Status: Married  Human resources officer Violence: Not At Risk (12/08/2022)   Humiliation, Afraid, Rape, and Kick questionnaire    Fear of Current or Ex-Partner: No    Emotionally Abused: No    Physically Abused: No    Sexually Abused: No      Review  of Systems  All other systems reviewed and are negative.      Objective:   Physical Exam Vitals reviewed.  Constitutional:      Appearance: Normal appearance.  Cardiovascular:     Rate and Rhythm: Normal rate and regular rhythm.     Heart sounds: Normal heart sounds. No murmur heard.    No friction rub. No gallop.  Pulmonary:     Effort: Pulmonary effort is normal. No respiratory distress.     Breath sounds: Normal breath sounds and air entry. No stridor, decreased air movement or transmitted upper airway sounds. No wheezing, rhonchi or rales.  Musculoskeletal:     Right lower leg: No edema.     Left lower leg: No edema.  Neurological:     Mental Status: He is alert.     7 cm erythematous ring with central clearing      Assessment & Plan:   COVID Patient has no evidence of pneumonia or any respiratory distress.  He is now 7 days in.  At this point I recommended tincture of time.  He seems to be recovering well from the infection and has not had a fever in over 4 days.  He is outside the therapeutic window for Paxlovid

## 2023-01-13 DIAGNOSIS — E119 Type 2 diabetes mellitus without complications: Secondary | ICD-10-CM | POA: Diagnosis not present

## 2023-01-13 LAB — HM DIABETES EYE EXAM

## 2023-01-15 ENCOUNTER — Encounter: Payer: Self-pay | Admitting: Nurse Practitioner

## 2023-01-15 ENCOUNTER — Ambulatory Visit: Payer: Medicare HMO | Attending: Nurse Practitioner | Admitting: Nurse Practitioner

## 2023-01-15 VITALS — BP 122/66 | HR 73 | Ht 68.0 in | Wt 144.0 lb

## 2023-01-15 DIAGNOSIS — Z951 Presence of aortocoronary bypass graft: Secondary | ICD-10-CM | POA: Diagnosis not present

## 2023-01-15 DIAGNOSIS — E785 Hyperlipidemia, unspecified: Secondary | ICD-10-CM | POA: Diagnosis not present

## 2023-01-15 DIAGNOSIS — E118 Type 2 diabetes mellitus with unspecified complications: Secondary | ICD-10-CM

## 2023-01-15 DIAGNOSIS — I10 Essential (primary) hypertension: Secondary | ICD-10-CM

## 2023-01-15 DIAGNOSIS — Z01810 Encounter for preprocedural cardiovascular examination: Secondary | ICD-10-CM

## 2023-01-15 DIAGNOSIS — I2510 Atherosclerotic heart disease of native coronary artery without angina pectoris: Secondary | ICD-10-CM

## 2023-01-15 NOTE — Patient Instructions (Signed)
Medication Instructions:   Your physician recommends that you continue on your current medications as directed. Please refer to the Current Medication list given to you today.   *If you need a refill on your cardiac medications before your next appointment, please call your pharmacy*   Lab Work:  None ordered.  If you have labs (blood work) drawn today and your tests are completely normal, you will receive your results only by: MyChart Message (if you have MyChart) OR A paper copy in the mail If you have any lab test that is abnormal or we need to change your treatment, we will call you to review the results.   Testing/Procedures:  None ordered.   Follow-Up: At [facility], you and your health needs are our priority.  As part of our continuing mission to provide you with exceptional heart care, we have created designated Provider Care Teams.  These Care Teams include your primary Cardiologist (physician) and Advanced Practice Providers (APPs -  Physician Assistants and Nurse Practitioners) who all work together to provide you with the care you need, when you need it.  We recommend signing up for the patient portal called "MyChart".  Sign up information is provided on this After Visit Summary.  MyChart is used to connect with patients for Virtual Visits (Telemedicine).  Patients are able to view lab/test results, encounter notes, upcoming appointments, etc.  Non-urgent messages can be sent to your provider as well.   To learn more about what you can do with MyChart, go to [email or web address].    Your next appointment:   5 month(s)  Provider:   Kristeen Miss, MD     Other Instructions  Mediterranean Diet A Mediterranean diet refers to food and lifestyle choices that are based on the traditions of countries located on the [place] Sea. It focuses on eating more fruits, vegetables, whole grains, beans, nuts, seeds, and heart-healthy fats, and eating less dairy,  meat, eggs, and processed foods with added sugar, salt, and fat. This way of eating has been shown to help prevent certain conditions and improve outcomes for people who have chronic diseases, like kidney disease and heart disease. What are tips for following this plan? Reading food labels Check the serving size of packaged foods. For foods such as rice and pasta, the serving size refers to the amount of cooked product, not dry. Check the total fat in packaged foods. Avoid foods that have saturated fat or trans fats. Check the ingredient list for added sugars, such as corn syrup. Shopping  Buy a variety of foods that offer a balanced diet, including: Fresh fruits and vegetables (produce). Grains, beans, nuts, and seeds. Some of these may be available in unpackaged forms or large amounts (in bulk). Fresh seafood. Poultry and eggs. Low-fat dairy products. Buy whole ingredients instead of prepackaged foods. Buy fresh fruits and vegetables in-season from local farmers markets. Buy plain frozen fruits and vegetables. If you do not have access to quality fresh seafood, buy precooked frozen shrimp or canned fish, such as tuna, salmon, or sardines. Stock your pantry so you always have certain foods on hand, such as olive oil, canned tuna, canned tomatoes, rice, pasta, and beans. Cooking Cook foods with extra-virgin olive oil instead of using butter or other vegetable oils. Have meat as a side dish, and have vegetables or grains as your main dish. This means having meat in small portions or adding small amounts of meat to foods like pasta or stew. Use beans or vegetables  instead of meat in common dishes like chili or lasagna. Experiment with different cooking methods. Try roasting, broiling, steaming, and sauting vegetables. Add frozen vegetables to soups, stews, pasta, or rice. Add nuts or seeds for added healthy fats and plant protein at each meal. You can add these to yogurt, salads, or vegetable  dishes. Marinate fish or vegetables using olive oil, lemon juice, garlic, and fresh herbs. Meal planning Plan to eat one vegetarian meal one day each week. Try to work up to two vegetarian meals, if possible. Eat seafood two or more times a week. Have healthy snacks readily available, such as: Vegetable sticks with hummus. Greek yogurt. Fruit and nut trail mix. Eat balanced meals throughout the week. This includes: Fruit: 2-3 servings a day. Vegetables: 4-5 servings a day. Low-fat dairy: 2 servings a day. Fish, poultry, or lean meat: 1 serving a day. Beans and legumes: 2 or more servings a week. Nuts and seeds: 1-2 servings a day. Whole grains: 6-8 servings a day. Extra-virgin olive oil: 3-4 servings a day. Limit red meat and sweets to only a few servings a month. Lifestyle  Cook and eat meals together with your family, when possible. Drink enough fluid to keep your urine pale yellow. Be physically active every day. This includes: Aerobic exercise like running or swimming. Leisure activities like gardening, walking, or housework. Get 7-8 hours of sleep each night. If recommended by your health care provider, drink red wine in moderation. This means 1 glass a day for nonpregnant women and 2 glasses a day for men. A glass of wine equals 5 oz (150 mL). What foods should I eat? Fruits Apples. Apricots. Avocado. Berries. Bananas. Cherries. Dates. Figs. Grapes. Lemons. Melon. Oranges. Peaches. Plums. Pomegranate. Vegetables Artichokes. Beets. Broccoli. Cabbage. Carrots. Eggplant. Green beans. Chard. Kale. Spinach. Onions. Leeks. Peas. Squash. Tomatoes. Peppers. Radishes. Grains Whole-grain pasta. Brown rice. Bulgur wheat. Polenta. Couscous. Whole-wheat bread. [place]. Meats and other proteins Beans. Almonds. Sunflower seeds. Pine nuts. Peanuts. [place]. Salmon. Scallops. Shrimp. [place]. Tilapia. Clams. Oysters. Eggs. Poultry without skin. Dairy Low-fat milk. Cheese. Greek  yogurt. Fats and oils Extra-virgin olive oil. Avocado oil. Grapeseed oil. Beverages Water. Red wine. Herbal tea. Sweets and desserts Greek yogurt with honey. Baked apples. Poached pears. Trail mix. Seasonings and condiments Basil. Cilantro. Coriander. Cumin. Mint. Parsley. Sage. Rosemary. Tarragon. Garlic. Oregano. Thyme. Pepper. Balsamic vinegar. Tahini. Hummus. Tomato sauce. Olives. Mushrooms. The items listed above may not be a complete list of foods and beverages you can eat. Contact a dietitian for more information. What foods should I limit? This is a list of foods that should be eaten rarely or only on special occasions. Fruits Fruit canned in syrup. Vegetables Deep-fried potatoes (french fries). Grains Prepackaged pasta or rice dishes. Prepackaged cereal with added sugar. Prepackaged snacks with added sugar. Meats and other proteins Beef. Pork. Lamb. Poultry with skin. Hot dogs. Berniece Salines. Dairy Ice cream. Sour cream. Whole milk. Fats and oils Butter. Canola oil. Vegetable oil. Beef fat (tallow). Lard. Beverages Juice. Sugar-sweetened soft drinks. Beer. Liquor and spirits. Sweets and desserts Cookies. Cakes. Pies. Candy. Seasonings and condiments Mayonnaise. Pre-made sauces and marinades. The items listed above may not be a complete list of foods and beverages you should limit. Contact a dietitian for more information. Summary The Mediterranean diet includes both food and lifestyle choices. Eat a variety of fresh fruits and vegetables, beans, nuts, seeds, and whole grains. Limit the amount of red meat and sweets that you eat. If recommended by your  health care provider, drink red wine in moderation. This means 1 glass a day for nonpregnant women and 2 glasses a day for men. A glass of wine equals 5 oz (150 mL). This information is not intended to replace advice given to you by your health care provider. Make sure you discuss any questions you have with your health care  provider. Document Revised: 11/03/2019 Document Reviewed: 08/31/2019 Elsevier Patient Education  [place]. Adopting a Healthy Lifestyle.   Weight: Know what a healthy weight is for you (roughly BMI <25) and aim to maintain this. You can calculate your body mass index on your smart phone  Diet: Aim for 7+ servings of fruits and vegetables daily Limit animal fats in diet for cholesterol and heart health - choose grass fed whenever available Avoid highly processed foods (fast food burgers, tacos, fried chicken, pizza, hot dogs, french fries)  Saturated fat comes in the form of butter, lard, coconut oil, margarine, partially hydrogenated oils, and fat in meat. These increase your risk of cardiovascular disease.  Use healthy plant oils, such as olive, canola, soy, corn, sunflower and peanut.  Whole foods such as fruits, vegetables and whole grains have fiber  Men need > 38 grams of fiber per day Women need > 25 grams of fiber per day  Load up on vegetables and fruits - one-half of your plate: Aim for color and variety, and remember that potatoes dont count. Go for whole grains - one-quarter of your plate: Whole wheat, barley, wheat berries, quinoa, oats, brown rice, and foods made with them. If you want pasta, go with whole wheat pasta. Protein power - one-quarter of your plate: Fish, chicken, beans, and nuts are all healthy, versatile protein sources. Limit red meat. You need carbohydrates for energy! The type of carbohydrate is more important than the amount. Choose carbohydrates such as vegetables, fruits, whole grains, beans, and nuts in the place of white rice, white pasta, potatoes (baked or fried), macaroni and cheese, cakes, cookies, and donuts.  If youre thirsty, drink water. Coffee and tea are good in moderation, but skip sugary drinks and limit milk and dairy products to one or two daily servings. Keep sugar intake at 6 teaspoons or 24 grams or LESS       Exercise: Aim for  150 min of moderate intensity exercise weekly for heart health, and weights twice weekly for bone health Stay active - any steps are better than no steps! Aim for 7-9 hours of sleep daily

## 2023-01-16 ENCOUNTER — Other Ambulatory Visit: Payer: Self-pay | Admitting: Family Medicine

## 2023-01-18 NOTE — Telephone Encounter (Signed)
Requested Prescriptions  Pending Prescriptions Disp Refills   metoprolol succinate (TOPROL-XL) 25 MG 24 hr tablet [Pharmacy Med Name: METOPROLOL SUCCINATE ER 25 MG Tablet Extended Release 24 Hour] 45 tablet 0    Sig: TAKE 1/2 TABLET EVERY DAY     Cardiovascular:  Beta Blockers Failed - 01/16/2023  2:56 AM      Failed - Valid encounter within last 6 months    Recent Outpatient Visits           1 year ago LLQ abdominal pain   [facility] Pickard, Priscille Heidelberg, MD   1 year ago Encounter for Medicare annual wellness exam   [facility] Pickard, Priscille Heidelberg, MD   1 year ago Plantar fasciitis   [facility] Donita Brooks, MD   1 year ago Controlled type 2 diabetes mellitus with complication, without long-term current use of insulin (HCC)   [facility] Donita Brooks, MD   1 year ago Controlled type 2 diabetes mellitus with complication, without long-term current use of insulin (HCC)   [facility] Pickard, Priscille Heidelberg, MD       Future Appointments             In 2 weeks Pickard, Priscille Heidelberg, MD [facility], [facility]   In 5 months Nahser, Deloris Ping, MD [facility]            Passed - Last BP in normal range    BP Readings from Last 1 Encounters:  01/15/23 122/66         Passed - Last Heart Rate in normal range    Pulse Readings from Last 1 Encounters:  01/15/23 73

## 2023-01-23 ENCOUNTER — Other Ambulatory Visit: Payer: Self-pay | Admitting: Family Medicine

## 2023-01-25 NOTE — Telephone Encounter (Signed)
Requested Prescriptions  Pending Prescriptions Disp Refills   lansoprazole (PREVACID) 30 MG capsule [Pharmacy Med Name: LANSOPRAZOLE 30 MG Capsule Delayed Release] 90 capsule 1    Sig: TAKE 1 CAPSULE EVERY DAY     Gastroenterology: Proton Pump Inhibitors 2 Failed - 01/23/2023  2:24 AM      Failed - Valid encounter within last 12 months    Recent Outpatient Visits           1 year ago LLQ abdominal pain   [facility] Pickard, Priscille Heidelberg, MD   1 year ago Encounter for Medicare annual wellness exam   [facility] Pickard, Priscille Heidelberg, MD   1 year ago Plantar fasciitis   [facility] Donita Brooks, MD   1 year ago Controlled type 2 diabetes mellitus with complication, without long-term current use of insulin (HCC)   [facility] Donita Brooks, MD   1 year ago Controlled type 2 diabetes mellitus with complication, without long-term current use of insulin (HCC)   [facility] Pickard, Priscille Heidelberg, MD       Future Appointments             In 1 week Pickard, Priscille Heidelberg, MD [facility], [facility]   In 5 months Nahser, Deloris Ping, MD [facility]            Passed - ALT in normal range and within 360 days    ALT  Date Value Ref Range Status  12/17/2022 17 9 - 46 U/L Final         Passed - AST in normal range and within 360 days    AST  Date Value Ref Range Status  12/17/2022 15 10 - 35 U/L Final

## 2023-02-02 DIAGNOSIS — T490X5A Adverse effect of local antifungal, anti-infective and anti-inflammatory drugs, initial encounter: Secondary | ICD-10-CM | POA: Diagnosis not present

## 2023-02-02 DIAGNOSIS — D492 Neoplasm of unspecified behavior of bone, soft tissue, and skin: Secondary | ICD-10-CM | POA: Diagnosis not present

## 2023-02-02 DIAGNOSIS — S80862A Insect bite (nonvenomous), left lower leg, initial encounter: Secondary | ICD-10-CM | POA: Diagnosis not present

## 2023-02-02 DIAGNOSIS — I872 Venous insufficiency (chronic) (peripheral): Secondary | ICD-10-CM | POA: Diagnosis not present

## 2023-02-02 DIAGNOSIS — C44329 Squamous cell carcinoma of skin of other parts of face: Secondary | ICD-10-CM | POA: Diagnosis not present

## 2023-02-03 ENCOUNTER — Encounter: Payer: Self-pay | Admitting: Family Medicine

## 2023-02-04 ENCOUNTER — Encounter: Payer: Self-pay | Admitting: Family Medicine

## 2023-02-04 ENCOUNTER — Ambulatory Visit (INDEPENDENT_AMBULATORY_CARE_PROVIDER_SITE_OTHER): Payer: Medicare HMO | Admitting: Family Medicine

## 2023-02-04 ENCOUNTER — Telehealth: Payer: Self-pay | Admitting: Physician Assistant

## 2023-02-04 VITALS — BP 110/62 | HR 69 | Temp 97.6°F | Ht 68.0 in | Wt 140.2 lb

## 2023-02-04 DIAGNOSIS — R634 Abnormal weight loss: Secondary | ICD-10-CM

## 2023-02-04 DIAGNOSIS — E118 Type 2 diabetes mellitus with unspecified complications: Secondary | ICD-10-CM

## 2023-02-04 DIAGNOSIS — K529 Noninfective gastroenteritis and colitis, unspecified: Secondary | ICD-10-CM

## 2023-02-04 NOTE — Progress Notes (Signed)
Wt Readings from Last 3 Encounters:  02/04/23 140 lb 3.2 oz (63.6 kg)  01/15/23 144 lb (65.3 kg)  01/12/23 145 lb 12.8 oz (66.1 kg)     Subjective:    Patient ID: Andrew [name], male    DOB: 1941-03-31, 82 y.o.   MRN: [id number] Patient continues to suffer from chronic diarrhea.  He has an appointment to see GI in May.  He stopped taking WelChol.  He is currently on Jardiance for his blood sugars due to his history of coronary artery disease.  Since starting Jardiance however he is lost 4 pounds.  His blood sugars are all less than 180 at night 2 hours after supper so they seem well-controlled.  He denies any chest pain shortness of breath or dyspnea on exertion Past Medical History:  Diagnosis Date   Allergy    grass etc.   Barrett's esophagus    BPH (benign prostatic hyperplasia)    Chest pain, atypical    Chronic kidney disease    kidney stones   Coronary artery disease    GERD (gastroesophageal reflux disease)    Barrett's   Hernia    Hyperlipidemia    Hypertension    Myocardial infarction    Thrombocytopenia    Ulcer 1960   Past Surgical History:  Procedure Laterality Date   CARDIAC CATHETERIZATION  02/14/2007   MITRAL REGURGITATION. LV NORMAL   CHOLECYSTECTOMY     COLONOSCOPY     CORONARY ARTERY BYPASS GRAFT     twice  2 by passes each time   INGUINAL HERNIA REPAIR     bilateral done twice   LITHOTRIPSY     UPPER GASTROINTESTINAL ENDOSCOPY     Current Outpatient Medications on File Prior to Visit  Medication Sig Dispense Refill   Accu-Chek Softclix Lancets lancets TEST BLOOD SUGAR ONE TIME DAILY 100 each 1   Alcohol Swabs (DROPSAFE ALCOHOL PREP) 70 % PADS USE TOPICALLY ONE TIME DAILY 100 each 1   amLODipine (NORVASC) 5 MG tablet TAKE 1 TABLET EVERY DAY 90 tablet 0   aspirin 81 MG tablet Take 81 mg by mouth daily.     augmented betamethasone dipropionate (DIPROLENE-AF) 0.05 % cream Apply topically.     Blood Glucose Monitoring Suppl (ACCU-CHEK GUIDE ME) w/Device KIT  Use as instructed to monitor FSBS 1x daily. Dx: E11.9 1 kit 1   cetirizine (ZYRTEC) 10 MG tablet Take 1 tablet (10 mg total) by mouth daily. 30 tablet 0   Cholecalciferol (VITAMIN D) 2000 units CAPS Take 1 capsule by mouth daily.     clobetasol cream (TEMOVATE) 0.05 % Apply 1 Application topically 2 (two) times daily.     empagliflozin (JARDIANCE) 25 MG TABS tablet Take 1 tablet (25 mg total) by mouth daily before breakfast. 30 tablet 11   finasteride (PROSCAR) 5 MG tablet TAKE 1 TABLET(5 MG) BY MOUTH DAILY. 90 tablet 3   glucose blood (ACCU-CHEK GUIDE) test strip TEST BLOOD SUGAR ONE TIME DAILY AS DIRECTED 100 strip 0   lansoprazole (PREVACID) 30 MG capsule TAKE 1 CAPSULE EVERY DAY 90 capsule 1   metFORMIN (GLUCOPHAGE-XR) 500 MG 24 hr tablet TAKE 2 TABLETS EVERY DAY WITH BREAKFAST 180 tablet 3   metoprolol succinate (TOPROL-XL) 25 MG 24 hr tablet TAKE 1/2 TABLET EVERY DAY 45 tablet 0   Multiple Vitamin (MULTIVITAMIN) tablet Take 1 tablet by mouth daily.     nitroGLYCERIN (NITROSTAT) 0.4 MG SL tablet Dissolve 1 tablet under the tongue every 5 minutes as needed  for chest pain. Max of 3 doses, then 911. 75 tablet 3   olopatadine (PATADAY) 0.1 % ophthalmic solution Place 1 drop into both eyes 2 (two) times daily. 5 mL 12   Omega-3 Fatty Acids (FISH OIL) 1000 MG CPDR Take 1,000 mg by mouth every morning.     simvastatin (ZOCOR) 10 MG tablet TAKE 1 TABLET AT BEDTIME 90 tablet 3   tamsulosin (FLOMAX) 0.4 MG CAPS capsule TAKE 1 CAPSULE EVERY DAY 90 capsule 3   tiZANidine (ZANAFLEX) 2 MG tablet Take 1 tablet (2 mg total) by mouth every 6 (six) hours as needed for muscle spasms. 30 tablet 1   No current facility-administered medications on file prior to visit.   Allergies  Allergen Reactions   Imdur [Isosorbide Mononitrate]     Unknown, per pt    Meloxicam Swelling    Facial swelling    Social History   Socioeconomic History   Marital status: Married    Spouse name: Mary   Number of  children: 2   Years of education: Not on file   Highest education level: Not on file  Occupational History   Occupation: Retired  Tobacco Use   Smoking status: Never   Smokeless tobacco: Never  Vaping Use   Vaping Use: Never used  Substance and Sexual Activity   Alcohol use: No   Drug use: No   Sexual activity: Not Currently  Other Topics Concern   Not on file  Social History Narrative   Married in 1961.   6 grandchildren   Social Determinants of Health   Financial Resource Strain: Low Risk  (12/08/2022)   Overall Financial Resource Strain (CARDIA)    Difficulty of Paying Living Expenses: Not hard at all  Food Insecurity: No Food Insecurity (12/08/2022)   Hunger Vital Sign    Worried About Running Out of Food in the Last Year: Never true    Ran Out of Food in the Last Year: Never true  Transportation Needs: No Transportation Needs (12/08/2022)   PRAPARE - Administrator, Civil Service (Medical): No    Lack of Transportation (Non-Medical): No  Physical Activity: Sufficiently Active (12/08/2022)   Exercise Vital Sign    Days of Exercise per Week: 5 days    Minutes of Exercise per Session: 30 min  Stress: No Stress Concern Present (12/08/2022)   [employer] of Occupational Health - Occupational Stress Questionnaire    Feeling of Stress : Not at all  Social Connections: Socially Integrated (12/08/2022)   Social Connection and Isolation Panel [NHANES]    Frequency of Communication with Friends and Family: More than three times a week    Frequency of Social Gatherings with Friends and Family: Three times a week    Attends Religious Services: More than 4 times per year    Active Member of Clubs or Organizations: Yes    Attends Banker Meetings: More than 4 times per year    Marital Status: Married  Catering manager Violence: Not At Risk (12/08/2022)   Humiliation, Afraid, Rape, and Kick questionnaire    Fear of Current or Ex-Partner: No     Emotionally Abused: No    Physically Abused: No    Sexually Abused: No      Review of Systems  All other systems reviewed and are negative.      Objective:   Physical Exam Vitals reviewed.  Constitutional:      Appearance: Normal appearance.  Cardiovascular:  Rate and Rhythm: Normal rate and regular rhythm.     Heart sounds: Normal heart sounds. No murmur heard.    No friction rub. No gallop.  Pulmonary:     Effort: Pulmonary effort is normal. No respiratory distress.     Breath sounds: Normal breath sounds and air entry. No stridor, decreased air movement or transmitted upper airway sounds. No wheezing, rhonchi or rales.  Musculoskeletal:     Right lower leg: No edema.     Left lower leg: No edema.  Neurological:     Mental Status: He is alert.     7 cm erythematous ring with central clearing      Assessment & Plan:   Chronic diarrhea  Controlled type 2 diabetes mellitus with complication, without long-term current use of insulin  Weight loss My biggest concern is the chronic diarrhea and weight loss.  I recommended a colonoscopy to rule out inflammatory bowel disease.  I believe the most recent weight loss could be related to the Jardiance.  I encouraged him to eat more protein such as fish chicken [place] egg whites etc.  I am also going to schedule a CT scan of the abdomen and pelvis given the weight loss.  His last A1c was 6.5 in March 7 which was excellent.  He is due to repeat his blood work in June

## 2023-02-04 NOTE — Telephone Encounter (Signed)
Patient called stating that he is due for his wisdom teeth procedure but was advised by PCP he would have to have his colonoscopy completed first. He has an appointment scheduled for May 7th, he is wondering how long after the appointment it would take for him to get scheduled for colonoscopy. Requesting a call back to discuss this. Please advise. Thank you.

## 2023-02-04 NOTE — Telephone Encounter (Signed)
Lm on vm for patient to return call.  Patient is over the typical age for continued colonoscopies. Office visit is most appropriate as he may not need a colonoscopy.

## 2023-02-10 NOTE — Telephone Encounter (Signed)
No return call received. Patient has an office visit on 02/16/23. Timing of colonoscopy will be discussed at that time.

## 2023-02-16 ENCOUNTER — Ambulatory Visit: Payer: Medicare HMO | Admitting: Physician Assistant

## 2023-02-16 ENCOUNTER — Encounter: Payer: Self-pay | Admitting: Physician Assistant

## 2023-02-16 ENCOUNTER — Telehealth: Payer: Self-pay

## 2023-02-16 ENCOUNTER — Other Ambulatory Visit: Payer: Medicare HMO

## 2023-02-16 VITALS — BP 100/50 | HR 76 | Ht 66.0 in | Wt 140.5 lb

## 2023-02-16 DIAGNOSIS — R194 Change in bowel habit: Secondary | ICD-10-CM | POA: Diagnosis not present

## 2023-02-16 DIAGNOSIS — R197 Diarrhea, unspecified: Secondary | ICD-10-CM | POA: Diagnosis not present

## 2023-02-16 DIAGNOSIS — R634 Abnormal weight loss: Secondary | ICD-10-CM

## 2023-02-16 MED ORDER — CHOLESTYRAMINE 4 G PO PACK: 4.0000 g | PACK | Freq: Two times a day (BID) | ORAL | 2 refills | Status: DC

## 2023-02-16 MED ORDER — NA SULFATE-K SULFATE-MG SULF 17.5-3.13-1.6 GM/177ML PO SOLN: 1.0000 | ORAL | 0 refills | Status: DC

## 2023-02-16 NOTE — Patient Instructions (Signed)
We have sent the following medications to your pharmacy for you to pick up at your convenience:  Suprep , Cholestyramine  Start Cholestyramine - Take 1 packet by mouth twice daily.  You have been scheduled for an endoscopy and colonoscopy. Please follow the written instructions given to you at your visit today. Please pick up your prep supplies at the pharmacy within the next 1-3 days. If you use inhalers (even only as needed), please bring them with you on the day of your procedure.  Your provider has requested that you go to the basement level for lab work before leaving today. Press "B" on the elevator. The lab is located at the first door on the left as you exit the elevator.  Due to recent changes in healthcare laws, you may see the results of your imaging and laboratory studies on MyChart before your provider has had a chance to review them.  We understand that in some cases there may be results that are confusing or concerning to you. Not all laboratory results come back in the same time frame and the provider may be waiting for multiple results in order to interpret others.  Please give [place] 48 hours in order for your provider to thoroughly review all the results before contacting the office for clarification of your results.   _______________________________________________________  If your blood pressure at your visit was 140/90 or greater, please contact your primary care physician to follow up on this.  _______________________________________________________  If you are age 84 or older, your body mass index should be between 23-30. Your Body mass index is 22.68 kg/m. If this is out of the aforementioned range listed, please consider follow up with your Primary Care Provider.  If you are age 19 or younger, your body mass index should be between 19-25. Your Body mass index is 22.68 kg/m. If this is out of the aformentioned range listed, please consider follow up with your Primary Care  Provider.   ________________________________________________________  The [facility] providers would like to encourage you to use [facility] to communicate with providers for non-urgent requests or questions.  Due to long hold times on the telephone, sending your provider a message by [facility] may be a faster and more efficient way to get a response.  Please allow 48 business hours for a response.  Please remember that this is for non-urgent requests.  _______________________________________________________  Thank you for choosing me and [facility].  Hyacinth Meeker PA-C

## 2023-02-16 NOTE — Progress Notes (Signed)
Chief Complaint: Diarrhea, weight loss  HPI:    Andrew [name] is an 82 year old male with a past medical history as listed below, known to Dr. Adela Lank, who was referred to me by Donita Brooks, MD for a complaint of diarrhea and weight loss.      04/27/2011 colonoscopy with Dr. Jarold Motto with severe diverticulosis in the sigmoid/descending colon, sessile polyp in the sigmoid colon and otherwise normal.  Repeat recommended in 10 years.  Recall patient recently had GI pathogen panel and ova and parasite exam ordered which was negative/normal.  Previously 07/15/2018 CBC, CMP and TSH were.      08/03/2018 patient seen in clinic and described that for 3 to 4 weeks he started with some loose stool out of the blue had at least 2-3 loose/watery stools per day and sometimes up to 6/day.  Stopping Losartan did not help.  Usually would use Imodium which would stop him up for 2 to 3 days but then it would come right back.  At that time recommend increasing fiber as well as starting Align.  Ordered C. difficile testing.  All stool testing was negative.    09/07/2018 patient followed in clinic and at that time the gushing of his stools I decreased with Align and fiber daily.  He was scheduled for colon in the [facility].    09/14/2018 colonoscopy with a large amount of liquid stool in the entire colon, 2 sessile polyps in the ascending colon, 2 sessile polyps in the ascending colon diminutive and the others were 3 to 4 mm in size, 3 mm polyp in the transverse colon diminutive polyp in the sigmoid and a single angiodysplastic lesion in the ascending colon multiple medium mouth diverticula.  Also internal hemorrhoids.  Pathology showed tubular adenomas.  Discussed possible repeat in 3 years given his age recommended follow-up first.  No cause for bowel changes.  Recommend low-dose Imodium as needed or trying Colestid 1 g twice daily.    Today, patient presents to clinic and tells me that for the past many years he has been okay  as far as his diarrhea is concerned, though he does know he has always had somewhat of an issue ever since he started him on Metformin which he takes 500 mg twice daily.  Tells me over the past 4 to 5 months he has had an increase in stools to 3-5 times a day which are liquid urgent and uncontrollable.  He was told to try over-the-counter Imodium which he used 3 times a day for about a week but did not feel like it really helped.  Along with this has noticed a weight loss of around 30 pounds over the past 2 years without really trying.  Recently his daughter started him on over-the-counter Peppermint oil and has been taking this for the past 3 days and feels like it is "maybe helped a little".  No abdominal pain with this.  Does not recall ever being on cholestyramine in the past.    Tells me that he supposed to have some teeth taken out but they are not bothering him at all.  He just went to see his cardiologist for clearance for anesthesia for this.  They cleared him.  He wonders if he needs to wait on his dental extraction.    Denies fever, chills, nausea, vomiting, abdominal pain or blood in his stool.  Past Medical History:  Diagnosis Date   Allergy    grass etc.   Barrett's esophagus  BPH (benign prostatic hyperplasia)    Chest pain, atypical    Chronic kidney disease    kidney stones   Coronary artery disease    GERD (gastroesophageal reflux disease)    Barrett's   Hernia    Hyperlipidemia    Hypertension    Myocardial infarction (HCC)    Thrombocytopenia (HCC)    Ulcer 1960    Past Surgical History:  Procedure Laterality Date   CARDIAC CATHETERIZATION  02/14/2007   MITRAL REGURGITATION. LV NORMAL   CHOLECYSTECTOMY     COLONOSCOPY     CORONARY ARTERY BYPASS GRAFT     twice  2 by passes each time   INGUINAL HERNIA REPAIR     bilateral done twice   LITHOTRIPSY     SKIN CANCER EXCISION     UPPER GASTROINTESTINAL ENDOSCOPY      Current Outpatient Medications  Medication  Sig Dispense Refill   Accu-Chek Softclix Lancets lancets TEST BLOOD SUGAR ONE TIME DAILY 100 each 1   Alcohol Swabs (DROPSAFE ALCOHOL PREP) 70 % PADS USE TOPICALLY ONE TIME DAILY 100 each 1   AMBULATORY NON FORMULARY MEDICATION Take 1 capsule by mouth in the morning, at noon, and at bedtime. Heather's Tummy Tamers IBS, Peppermint oil with ginger and fennel     amLODipine (NORVASC) 5 MG tablet TAKE 1 TABLET EVERY DAY 90 tablet 0   aspirin 81 MG tablet Take 81 mg by mouth daily.     augmented betamethasone dipropionate (DIPROLENE-AF) 0.05 % cream Apply topically.     Blood Glucose Monitoring Suppl (ACCU-CHEK GUIDE ME) w/Device KIT Use as instructed to monitor FSBS 1x daily. Dx: E11.9 1 kit 1   cetirizine (ZYRTEC) 10 MG tablet Take 1 tablet (10 mg total) by mouth daily. 30 tablet 0   Cholecalciferol (VITAMIN D) 2000 units CAPS Take 1 capsule by mouth daily.     clobetasol cream (TEMOVATE) 0.05 % Apply 1 Application topically 2 (two) times daily.     empagliflozin (JARDIANCE) 25 MG TABS tablet Take 1 tablet (25 mg total) by mouth daily before breakfast. 30 tablet 11   finasteride (PROSCAR) 5 MG tablet TAKE 1 TABLET(5 MG) BY MOUTH DAILY. 90 tablet 3   glucose blood (ACCU-CHEK GUIDE) test strip TEST BLOOD SUGAR ONE TIME DAILY AS DIRECTED 100 strip 0   lansoprazole (PREVACID) 30 MG capsule TAKE 1 CAPSULE EVERY DAY 90 capsule 1   metFORMIN (GLUCOPHAGE-XR) 500 MG 24 hr tablet TAKE 2 TABLETS EVERY DAY WITH BREAKFAST 180 tablet 3   metoprolol succinate (TOPROL-XL) 25 MG 24 hr tablet TAKE 1/2 TABLET EVERY DAY 45 tablet 0   Multiple Vitamin (MULTIVITAMIN) tablet Take 1 tablet by mouth daily.     nitroGLYCERIN (NITROSTAT) 0.4 MG SL tablet Dissolve 1 tablet under the tongue every 5 minutes as needed for chest pain. Max of 3 doses, then 911. 75 tablet 3   olopatadine (PATADAY) 0.1 % ophthalmic solution Place 1 drop into both eyes 2 (two) times daily. 5 mL 12   Omega-3 Fatty Acids (FISH OIL) 1000 MG CPDR Take  1,000 mg by mouth every morning.     simvastatin (ZOCOR) 10 MG tablet TAKE 1 TABLET AT BEDTIME 90 tablet 3   tamsulosin (FLOMAX) 0.4 MG CAPS capsule TAKE 1 CAPSULE EVERY DAY 90 capsule 3   tiZANidine (ZANAFLEX) 2 MG tablet Take 1 tablet (2 mg total) by mouth every 6 (six) hours as needed for muscle spasms. 30 tablet 1   No current facility-administered medications for this  visit.    Allergies as of 02/16/2023 - Review Complete 02/16/2023  Allergen Reaction Noted   Imdur [isosorbide mononitrate]  04/01/2011   Meloxicam Swelling 12/27/2010    Family History  Problem Relation Age of Onset   Stroke Father    Heart attack Mother    Heart attack Maternal Grandmother    Heart attack Maternal Grandfather    Heart disease Sister    Heart disease Brother    Heart disease Sister    Heart disease Brother    Prostate cancer Brother    Colon cancer Neg Hx    Esophageal cancer Neg Hx    Rectal cancer Neg Hx    Stomach cancer Neg Hx     Social History   Socioeconomic History   Marital status: Married    Spouse name: Mary   Number of children: 2   Years of education: Not on file   Highest education level: Not on file  Occupational History   Occupation: Retired  Tobacco Use   Smoking status: Never   Smokeless tobacco: Never  Vaping Use   Vaping Use: Never used  Substance and Sexual Activity   Alcohol use: No   Drug use: No   Sexual activity: Not Currently  Other Topics Concern   Not on file  Social History Narrative   Married in 1961.   6 grandchildren   Social Determinants of Health   Financial Resource Strain: Low Risk  (12/08/2022)   Overall Financial Resource Strain (CARDIA)    Difficulty of Paying Living Expenses: Not hard at all  Food Insecurity: No Food Insecurity (12/08/2022)   Hunger Vital Sign    Worried About Running Out of Food in the Last Year: Never true    Ran Out of Food in the Last Year: Never true  Transportation Needs: No Transportation Needs  (12/08/2022)   PRAPARE - Administrator, Civil Service (Medical): No    Lack of Transportation (Non-Medical): No  Physical Activity: Sufficiently Active (12/08/2022)   Exercise Vital Sign    Days of Exercise per Week: 5 days    Minutes of Exercise per Session: 30 min  Stress: No Stress Concern Present (12/08/2022)   [employer] of Occupational Health - Occupational Stress Questionnaire    Feeling of Stress : Not at all  Social Connections: Socially Integrated (12/08/2022)   Social Connection and Isolation Panel [NHANES]    Frequency of Communication with Friends and Family: More than three times a week    Frequency of Social Gatherings with Friends and Family: Three times a week    Attends Religious Services: More than 4 times per year    Active Member of Clubs or Organizations: Yes    Attends Banker Meetings: More than 4 times per year    Marital Status: Married  Catering manager Violence: Not At Risk (12/08/2022)   Humiliation, Afraid, Rape, and Kick questionnaire    Fear of Current or Ex-Partner: No    Emotionally Abused: No    Physically Abused: No    Sexually Abused: No    Review of Systems:    Constitutional: No fever or chills Skin: No rash  Cardiovascular: No chest pain Respiratory: No SOB Gastrointestinal: See HPI and otherwise negative Genitourinary: No dysuria  Neurological: No headache, dizziness or syncope Musculoskeletal: No new muscle or joint pain Hematologic: No bleeding  Psychiatric: No history of depression or anxiety   Physical Exam:  Vital signs: BP (!) 100/50 (BP Location: Left  Arm, Patient Position: Sitting, Cuff Size: Normal)   Pulse 76   Ht 5\' 6"  (1.676 m)   Wt 140 lb 8 oz (63.7 kg)   BMI 22.68 kg/m    Constitutional:   Pleasant elderly Caucasian male appears to be in NAD, Well developed, Well nourished, alert and cooperative Head:  Normocephalic and atraumatic. Eyes:   PEERL, EOMI. No icterus. Conjunctiva  pink. Ears:  Normal auditory acuity. Neck:  Supple Throat: Oral cavity and pharynx without inflammation, swelling or lesion.  Respiratory: Respirations even and unlabored. Lungs clear to auscultation bilaterally.   No wheezes, crackles, or rhonchi.  Cardiovascular: Normal S1, S2. No MRG. Regular rate and rhythm. No peripheral edema, cyanosis or pallor.  Gastrointestinal:  Soft, nondistended, nontender. No rebound or guarding.  Increased bowel sounds all 4 quadrants, no appreciable masses or hepatomegaly. Rectal:  Not performed.  Msk:  Symmetrical without gross deformities. Without edema, no deformity or joint abnormality.  Neurologic:  Alert and  oriented x4;  grossly normal neurologically.  Skin:   Dry and intact without significant lesions or rashes. Psychiatric: Demonstrates good judgement and reason without abnormal affect or behaviors.  RELEVANT LABS AND IMAGING: CBC    Component Value Date/Time   WBC 6.4 12/17/2022 0835   RBC 4.47 12/17/2022 0835   HGB 13.6 12/17/2022 0835   HGB 14.4 05/31/2020 0848   HCT 40.4 12/17/2022 0835   HCT 41.9 05/31/2020 0848   PLT 70 (L) 12/17/2022 0835   PLT CANCELED 05/31/2020 0848   MCV 90.4 12/17/2022 0835   MCV 87 05/31/2020 0848   MCH 30.4 12/17/2022 0835   MCHC 33.7 12/17/2022 0835   RDW 12.0 12/17/2022 0835   RDW 12.4 05/31/2020 0848   LYMPHSABS 1,261 12/17/2022 0835   MONOABS 0.5 10/16/2022 0940   EOSABS 102 12/17/2022 0835   BASOSABS 38 12/17/2022 0835    CMP     Component Value Date/Time   NA 142 12/17/2022 0835   NA 141 06/02/2022 1100   K 4.2 12/17/2022 0835   CL 106 12/17/2022 0835   CO2 27 12/17/2022 0835   GLUCOSE 176 (H) 12/17/2022 0835   BUN 18 12/17/2022 0835   BUN 20 06/02/2022 1100   CREATININE 0.80 12/17/2022 0835   CALCIUM 9.1 12/17/2022 0835   PROT 5.9 (L) 12/17/2022 0835   PROT 6.1 05/27/2021 0808   ALBUMIN 4.2 05/27/2021 0808   AST 15 12/17/2022 0835   ALT 17 12/17/2022 0835   ALKPHOS 69 05/27/2021 0808    BILITOT 0.6 12/17/2022 0835   BILITOT 0.4 05/27/2021 0808   GFRNONAA 82 04/09/2021 0805   GFRAA 95 04/09/2021 0805    Assessment: 1.  Diarrhea: Patient was seen back in 2019 for this with negative workup and started on Imodium/cholestyramine, we have not seen him over the past 5 years, he tells me he did well for some years but then over the past 4 to 5 months his diarrhea came back; consider infectious versus inflammatory versus side effect from metformin 2.  Weight loss: 30 pounds in the past years without trying, CT has been ordered by PCP  Plan: 1.  Recommend patient restart Cholestyramine twice daily for now 2.  Went ahead and scheduled patient for a diagnostic EGD and colonoscopy given the weight loss and change in bowel habits with Dr. Adela Lank in the [facility].  Patient was pushing for these procedures today.  He wants to know what is going on.  This is scheduled for the end of June to  allow [place] time to see if the Cholestyramine works and also to allow for CT to be done to make sure there is no other cause seen for this. 3.  Repeat stool studies including GI path panel and fecal pancreatic elastase 4.  We will ask for cardiac clearance given patient's age 31.  Discussed dental extraction, if the patient is not having any pain and there is no risk of waiting on this then would recommend that he hold for now 6.  Patient to follow in clinic with [place] per recommendations after stool studies and CT/procedures.  Hyacinth Meeker, PA-C [facility] 02/16/2023, 10:54 AM  Cc: Donita Brooks, MD

## 2023-02-16 NOTE — Telephone Encounter (Signed)
Request for surgical clearance:     Endoscopy Procedure  What type of surgery is being performed?     Endo/Colon   When is this surgery scheduled?     04/06/2023  What type of clearance is required ?   Cardiac Clearance   Are there any medications that need to be held prior to surgery and how long? No meds to hold.   Practice name and name of physician performing surgery?      [facility]  What is your office phone and fax number?      Phone- [phone]  Fax- [phone]  Anesthesia type (None, local, MAC, general) ?       MAC

## 2023-02-16 NOTE — Progress Notes (Signed)
Agree with assessment and plan with the following thoughts.  If his teeth are loose or at risk for falling out would have him see the dentist to have this dealt with prior to the endoscopy where there is risk for injury to his teeth or them falling out if loose. Otherwise await results of CT scan in the interim to rule out malignancy

## 2023-02-16 NOTE — Telephone Encounter (Signed)
   Name: Andrew [name]  DOB: 1941-01-06  MRN: [id number]   Primary Cardiologist: Kristeen Miss, MD  Chart reviewed as part of pre-operative protocol coverage. Andrew [name] was last seen on 01/15/2023 by Eligha Bridegroom, NP.  At that time patient was cleared for dental extractions of 4 teeth.  RCRI 0.9%.  Functional capacity in METS 6.61 according to the [facility] activity status index (DASI).  Therefore, based on ACC/AHA guidelines, the patient would be at acceptable risk for the planned procedure without further cardiovascular testing.    I will route this recommendation to the requesting party via Epic fax function and remove from pre-op pool. Please call with questions.  Carlos Levering, NP 02/16/2023, 4:36 PM    .surgery

## 2023-02-18 ENCOUNTER — Ambulatory Visit: Payer: Medicare HMO

## 2023-02-18 DIAGNOSIS — R194 Change in bowel habit: Secondary | ICD-10-CM

## 2023-02-18 DIAGNOSIS — Z0389 Encounter for observation for other suspected diseases and conditions ruled out: Secondary | ICD-10-CM | POA: Diagnosis not present

## 2023-02-18 DIAGNOSIS — A048 Other specified bacterial intestinal infections: Secondary | ICD-10-CM | POA: Diagnosis not present

## 2023-02-18 DIAGNOSIS — R634 Abnormal weight loss: Secondary | ICD-10-CM

## 2023-02-18 DIAGNOSIS — R197 Diarrhea, unspecified: Secondary | ICD-10-CM | POA: Diagnosis not present

## 2023-02-18 NOTE — Telephone Encounter (Signed)
FYI- Cardiac Clearance approved.

## 2023-02-21 LAB — GI PROFILE, STOOL, PCR

## 2023-02-25 ENCOUNTER — Telehealth: Payer: Self-pay | Admitting: Physician Assistant

## 2023-02-25 LAB — PANCREATIC ELASTASE, FECAL: Pancreatic Elastase-1, Stool: 389 mcg/g

## 2023-02-25 NOTE — Telephone Encounter (Signed)
Patient called requesting to speak further about prescriptions that were prescribed on 05/07. Please advise, thank you.

## 2023-02-26 ENCOUNTER — Ambulatory Visit (INDEPENDENT_AMBULATORY_CARE_PROVIDER_SITE_OTHER): Payer: Medicare HMO | Admitting: Family Medicine

## 2023-02-26 ENCOUNTER — Encounter: Payer: Self-pay | Admitting: Family Medicine

## 2023-02-26 VITALS — BP 126/72 | HR 67 | Temp 98.3°F | Ht 66.0 in | Wt 138.6 lb

## 2023-02-26 DIAGNOSIS — R634 Abnormal weight loss: Secondary | ICD-10-CM | POA: Diagnosis not present

## 2023-02-26 NOTE — Telephone Encounter (Signed)
Attempted to reach pt on mobile phone. Voicemail is full and not accepting new messages at this time.

## 2023-02-26 NOTE — Progress Notes (Signed)
Wt Readings from Last 3 Encounters:  02/26/23 138 lb 9.6 oz (62.9 kg)  02/16/23 140 lb 8 oz (63.7 kg)  02/04/23 140 lb 3.2 oz (63.6 kg)     Subjective:    Patient ID: Andrew [name], male    DOB: 1940-12-26, 82 y.o.   MRN: [id number]  Patient is concerned because he continues to lose weight.  He is seeing GI.  They have scheduled the patient for the colonoscopy but this is pending.  He continues to have frequent diarrhea.  They started the patient on cholestyramine.  We tried this in the past with no benefit but I recommended he give it another shot.  He also has a CT scan scheduled for June 4 given his weight loss.  He denies any fevers or chills.  He denies any chest pain. Past Medical History:  Diagnosis Date   Allergy    grass etc.   Barrett's esophagus    BPH (benign prostatic hyperplasia)    Chest pain, atypical    Chronic kidney disease    kidney stones   Coronary artery disease    GERD (gastroesophageal reflux disease)    Barrett's   Hernia    Hyperlipidemia    Hypertension    Myocardial infarction (HCC)    Thrombocytopenia (HCC)    Ulcer 1960   Past Surgical History:  Procedure Laterality Date   CARDIAC CATHETERIZATION  02/14/2007   MITRAL REGURGITATION. LV NORMAL   CHOLECYSTECTOMY     COLONOSCOPY     CORONARY ARTERY BYPASS GRAFT     twice  2 by passes each time   INGUINAL HERNIA REPAIR     bilateral done twice   LITHOTRIPSY     SKIN CANCER EXCISION     UPPER GASTROINTESTINAL ENDOSCOPY     Current Outpatient Medications on File Prior to Visit  Medication Sig Dispense Refill   Accu-Chek Softclix Lancets lancets TEST BLOOD SUGAR ONE TIME DAILY 100 each 1   Alcohol Swabs (DROPSAFE ALCOHOL PREP) 70 % PADS USE TOPICALLY ONE TIME DAILY 100 each 1   AMBULATORY NON FORMULARY MEDICATION Take 1 capsule by mouth in the morning, at noon, and at bedtime. Heather's Tummy Tamers IBS, Peppermint oil with ginger and fennel     amLODipine (NORVASC) 5 MG tablet TAKE 1 TABLET  EVERY DAY 90 tablet 0   aspirin 81 MG tablet Take 81 mg by mouth daily.     augmented betamethasone dipropionate (DIPROLENE-AF) 0.05 % cream Apply topically.     Blood Glucose Monitoring Suppl (ACCU-CHEK GUIDE ME) w/Device KIT Use as instructed to monitor FSBS 1x daily. Dx: E11.9 1 kit 1   cetirizine (ZYRTEC) 10 MG tablet Take 1 tablet (10 mg total) by mouth daily. 30 tablet 0   Cholecalciferol (VITAMIN D) 2000 units CAPS Take 1 capsule by mouth daily.     cholestyramine (QUESTRAN) 4 g packet Take 1 packet (4 g total) by mouth 2 (two) times daily. 60 each 2   clobetasol cream (TEMOVATE) 0.05 % Apply 1 Application topically 2 (two) times daily.     empagliflozin (JARDIANCE) 25 MG TABS tablet Take 1 tablet (25 mg total) by mouth daily before breakfast. 30 tablet 11   finasteride (PROSCAR) 5 MG tablet TAKE 1 TABLET(5 MG) BY MOUTH DAILY. 90 tablet 3   glucose blood (ACCU-CHEK GUIDE) test strip TEST BLOOD SUGAR ONE TIME DAILY AS DIRECTED 100 strip 0   lansoprazole (PREVACID) 30 MG capsule TAKE 1 CAPSULE EVERY DAY 90 capsule 1  metFORMIN (GLUCOPHAGE-XR) 500 MG 24 hr tablet TAKE 2 TABLETS EVERY DAY WITH BREAKFAST 180 tablet 3   metoprolol succinate (TOPROL-XL) 25 MG 24 hr tablet TAKE 1/2 TABLET EVERY DAY 45 tablet 0   Multiple Vitamin (MULTIVITAMIN) tablet Take 1 tablet by mouth daily.     Na Sulfate-K Sulfate-Mg Sulf (SUPREP BOWEL PREP KIT) 17.5-3.13-1.6 GM/177ML SOLN Take 1 kit by mouth as directed. For colonoscopy prep 354 mL 0   nitroGLYCERIN (NITROSTAT) 0.4 MG SL tablet Dissolve 1 tablet under the tongue every 5 minutes as needed for chest pain. Max of 3 doses, then 911. 75 tablet 3   olopatadine (PATADAY) 0.1 % ophthalmic solution Place 1 drop into both eyes 2 (two) times daily. 5 mL 12   Omega-3 Fatty Acids (FISH OIL) 1000 MG CPDR Take 1,000 mg by mouth every morning.     simvastatin (ZOCOR) 10 MG tablet TAKE 1 TABLET AT BEDTIME 90 tablet 3   tamsulosin (FLOMAX) 0.4 MG CAPS capsule TAKE 1  CAPSULE EVERY DAY 90 capsule 3   tiZANidine (ZANAFLEX) 2 MG tablet Take 1 tablet (2 mg total) by mouth every 6 (six) hours as needed for muscle spasms. 30 tablet 1   No current facility-administered medications on file prior to visit.   Allergies  Allergen Reactions   Imdur [Isosorbide Mononitrate]     Unknown, per pt    Meloxicam Swelling    Facial swelling    Social History   Socioeconomic History   Marital status: Married    Spouse name: Mary   Number of children: 2   Years of education: Not on file   Highest education level: Not on file  Occupational History   Occupation: Retired  Tobacco Use   Smoking status: Never   Smokeless tobacco: Never  Vaping Use   Vaping Use: Never used  Substance and Sexual Activity   Alcohol use: No   Drug use: No   Sexual activity: Not Currently  Other Topics Concern   Not on file  Social History Narrative   Married in 1961.   6 grandchildren   Social Determinants of Health   Financial Resource Strain: Low Risk  (12/08/2022)   Overall Financial Resource Strain (CARDIA)    Difficulty of Paying Living Expenses: Not hard at all  Food Insecurity: No Food Insecurity (12/08/2022)   Hunger Vital Sign    Worried About Running Out of Food in the Last Year: Never true    Ran Out of Food in the Last Year: Never true  Transportation Needs: No Transportation Needs (12/08/2022)   PRAPARE - Administrator, Civil Service (Medical): No    Lack of Transportation (Non-Medical): No  Physical Activity: Sufficiently Active (12/08/2022)   Exercise Vital Sign    Days of Exercise per Week: 5 days    Minutes of Exercise per Session: 30 min  Stress: No Stress Concern Present (12/08/2022)   [employer] of Occupational Health - Occupational Stress Questionnaire    Feeling of Stress : Not at all  Social Connections: Socially Integrated (12/08/2022)   Social Connection and Isolation Panel [NHANES]    Frequency of Communication with Friends and  Family: More than three times a week    Frequency of Social Gatherings with Friends and Family: Three times a week    Attends Religious Services: More than 4 times per year    Active Member of Clubs or Organizations: Yes    Attends Banker Meetings: More than 4 times per  year    Marital Status: Married  Catering manager Violence: Not At Risk (12/08/2022)   Humiliation, Afraid, Rape, and Kick questionnaire    Fear of Current or Ex-Partner: No    Emotionally Abused: No    Physically Abused: No    Sexually Abused: No      Review of Systems  All other systems reviewed and are negative.      Objective:   Physical Exam Vitals reviewed.  Constitutional:      Appearance: Normal appearance.  Cardiovascular:     Rate and Rhythm: Normal rate and regular rhythm.     Heart sounds: Normal heart sounds. No murmur heard.    No friction rub. No gallop.  Pulmonary:     Effort: Pulmonary effort is normal. No respiratory distress.     Breath sounds: Normal breath sounds and air entry. No stridor, decreased air movement or transmitted upper airway sounds. No wheezing, rhonchi or rales.  Musculoskeletal:     Right lower leg: No edema.     Left lower leg: No edema.  Neurological:     Mental Status: He is alert.          Assessment & Plan:   Weight loss I explained to the patient that we need to get the results of the CT scan to rule out malignancy in the abdomen and pelvis and also get the results of the colonoscopy to rule out inflammatory bowel disease.  Could try the patient on Remeron as an appetite stimulant if CT and colonoscopy are normal

## 2023-02-26 NOTE — Telephone Encounter (Signed)
Pt returned call. Pt had questions regarding Cholestyramine, pt was under the impression that this was for cholesterol and he was afraid that he would be taking too much cholesterol medication. I informed pt that Cholestyramine was prescribed as an off label use for his diarrhea. Patient has been advised that he is fine to take both medications. We reviewed pt's stool study results as well, pt verbalized understanding and had no concerns at the end of the call.   See 02/18/23 stool study result note for details.

## 2023-03-01 DIAGNOSIS — C44329 Squamous cell carcinoma of skin of other parts of face: Secondary | ICD-10-CM | POA: Diagnosis not present

## 2023-03-15 ENCOUNTER — Other Ambulatory Visit: Payer: Medicare HMO

## 2023-03-16 ENCOUNTER — Ambulatory Visit
Admission: RE | Admit: 2023-03-16 | Discharge: 2023-03-16 | Disposition: A | Discharge status: Home / Self Care | Payer: Medicare HMO | Source: Ambulatory Visit | Attending: Family Medicine | Admitting: Family Medicine

## 2023-03-16 DIAGNOSIS — K529 Noninfective gastroenteritis and colitis, unspecified: Secondary | ICD-10-CM

## 2023-03-16 DIAGNOSIS — K5730 Diverticulosis of large intestine without perforation or abscess without bleeding: Secondary | ICD-10-CM | POA: Diagnosis not present

## 2023-03-16 DIAGNOSIS — N200 Calculus of kidney: Secondary | ICD-10-CM | POA: Diagnosis not present

## 2023-03-16 DIAGNOSIS — R634 Abnormal weight loss: Secondary | ICD-10-CM | POA: Diagnosis not present

## 2023-03-16 DIAGNOSIS — N401 Enlarged prostate with lower urinary tract symptoms: Secondary | ICD-10-CM | POA: Diagnosis not present

## 2023-03-16 MED ORDER — IOPAMIDOL (ISOVUE-300) INJECTION 61%
100.0000 mL | Freq: Once | INTRAVENOUS | Status: AC | PRN
  Administered 2023-03-16: 100 mL via INTRAVENOUS

## 2023-03-18 ENCOUNTER — Other Ambulatory Visit: Payer: Self-pay | Admitting: Family Medicine

## 2023-03-18 NOTE — Telephone Encounter (Signed)
Requested Prescriptions  Pending Prescriptions Disp Refills   amLODipine (NORVASC) 5 MG tablet [Pharmacy Med Name: AMLODIPINE BESYLATE 5 MG Tablet] 90 tablet 0    Sig: TAKE 1 TABLET EVERY DAY     Cardiovascular: Calcium Channel Blockers 2 Failed - 03/18/2023  3:13 AM      Failed - Valid encounter within last 6 months    Recent Outpatient Visits           1 year ago LLQ abdominal pain   [facility] Pickard, Priscille Heidelberg, MD   1 year ago Encounter for Medicare annual wellness exam   [facility] Pickard, Priscille Heidelberg, MD   1 year ago Plantar fasciitis   [facility] Donita Brooks, MD   1 year ago Controlled type 2 diabetes mellitus with complication, without long-term current use of insulin (HCC)   [facility] Donita Brooks, MD   1 year ago Controlled type 2 diabetes mellitus with complication, without long-term current use of insulin (HCC)   [facility] Pickard, Priscille Heidelberg, MD       Future Appointments             In 3 months Nahser, Deloris Ping, MD [facility], [facility]            Passed - Last BP in normal range    BP Readings from Last 1 Encounters:  02/26/23 126/72         Passed - Last Heart Rate in normal range    Pulse Readings from Last 1 Encounters:  02/26/23 67

## 2023-03-22 ENCOUNTER — Other Ambulatory Visit: Payer: Medicare HMO

## 2023-03-25 ENCOUNTER — Telehealth: Payer: Self-pay

## 2023-03-25 NOTE — Telephone Encounter (Signed)
-----   Message from Unk Lightning, [place] sent at 03/25/2023  8:34 AM EDT ----- Regarding: CT Please let patient know I reviewed CT-no explanation for weight loss- ok to proceed with Colon/EGD as scheduled.  Thanks-JLL

## 2023-03-25 NOTE — Telephone Encounter (Signed)
Left message for pt to call back  °

## 2023-03-30 NOTE — Telephone Encounter (Signed)
Called and left patient a detailed vm on his home phone with results and recommendations. I provided patient with his EGD and colonoscopy appt information. I advised pt to call back if he has any questions or concerns.

## 2023-04-01 ENCOUNTER — Other Ambulatory Visit: Payer: Self-pay | Admitting: Family Medicine

## 2023-04-01 ENCOUNTER — Telehealth: Payer: Self-pay | Admitting: Physician Assistant

## 2023-04-01 NOTE — Telephone Encounter (Signed)
Requested Prescriptions  Pending Prescriptions Disp Refills   metoprolol succinate (TOPROL-XL) 25 MG 24 hr tablet [Pharmacy Med Name: METOPROLOL SUCCINATE ER 25 MG Tablet Extended Release 24 Hour] 45 tablet 0    Sig: TAKE 1/2 TABLET EVERY DAY     Cardiovascular:  Beta Blockers Failed - 04/01/2023  3:27 AM      Failed - Valid encounter within last 6 months    Recent Outpatient Visits           1 year ago LLQ abdominal pain   [facility] Pickard, Priscille Heidelberg, MD   1 year ago Encounter for Medicare annual wellness exam   [facility] Pickard, Priscille Heidelberg, MD   1 year ago Plantar fasciitis   [facility] Donita Brooks, MD   1 year ago Controlled type 2 diabetes mellitus with complication, without long-term current use of insulin (HCC)   [facility] Donita Brooks, MD   1 year ago Controlled type 2 diabetes mellitus with complication, without long-term current use of insulin (HCC)   [facility] Pickard, Priscille Heidelberg, MD       Future Appointments             Tomorrow Pickard, Priscille Heidelberg, MD [facility], [facility]   In 3 months Nahser, Deloris Ping, MD [facility] at [facility], [facility]            Passed - Last BP in normal range    BP Readings from Last 1 Encounters:  02/26/23 126/72         Passed - Last Heart Rate in normal range    Pulse Readings from Last 1 Encounters:  02/26/23 67

## 2023-04-01 NOTE — Telephone Encounter (Signed)
Inbound call from patient wishing to speak regarding questions he has about which medication he needs to hold off from taking for upcoming colonoscopy. Please advise, thank you.

## 2023-04-01 NOTE — Telephone Encounter (Signed)
Attempted to reach patient twice. His vm is full at this time and unable to accept any new messages at this time. Patient is taking Metformin and Jardiance.   Diabetic patients - ORAL DIABETIC MEDICATION INSTRUCTIONS    (Metformin, Glipizide, Glimepiride, Farxiga, [place], [place], [place], [place], [place])    The day before your procedure:      Take your diabetic pill as you do normally   The day of your procedure:      Do not take your diabetic pill       We will check your blood sugar levels during the admission process and again in Recovery before discharging you home

## 2023-04-01 NOTE — Telephone Encounter (Signed)
Returned call to patient. I spoke with patient at length regarding his prep instructions. Patient informed me that he is only taking Jardiance, pt has been advised to hold Jardiance the day prior. Pt is taking Questran, I told him to hold questran the day prior and the day of the procedure because we want to make sure his colon is cleaned out. I reviewed the diet in detail with patient. I answered all of patient's questions, he was very thankful and appreciative.

## 2023-04-01 NOTE — Telephone Encounter (Signed)
Patient is returning your call asked that you please call his home number on file.

## 2023-04-02 ENCOUNTER — Encounter: Payer: Self-pay | Admitting: Family Medicine

## 2023-04-02 ENCOUNTER — Ambulatory Visit (INDEPENDENT_AMBULATORY_CARE_PROVIDER_SITE_OTHER): Payer: Medicare HMO | Admitting: Family Medicine

## 2023-04-02 VITALS — BP 120/64 | HR 80 | Temp 97.7°F | Ht 66.0 in | Wt 136.6 lb

## 2023-04-02 DIAGNOSIS — Z7984 Long term (current) use of oral hypoglycemic drugs: Secondary | ICD-10-CM | POA: Diagnosis not present

## 2023-04-02 DIAGNOSIS — R634 Abnormal weight loss: Secondary | ICD-10-CM

## 2023-04-02 DIAGNOSIS — Z125 Encounter for screening for malignant neoplasm of prostate: Secondary | ICD-10-CM | POA: Diagnosis not present

## 2023-04-02 DIAGNOSIS — E118 Type 2 diabetes mellitus with unspecified complications: Secondary | ICD-10-CM | POA: Diagnosis not present

## 2023-04-02 NOTE — Progress Notes (Signed)
Wt Readings from Last 3 Encounters:  04/02/23 136 lb 9.6 oz (62 kg)  02/26/23 138 lb 9.6 oz (62.9 kg)  02/16/23 140 lb 8 oz (63.7 kg)     Subjective:    Patient ID: Andrew [name], male    DOB: December 05, 1940, 82 y.o.   MRN: [id number]  Patient is concerned because he continues to lose weight.  He continues to have loose stool.  However the frequency is much better.  Is now 1 or 2 a day.  They are loose and runny per his report.  He also has urgency at times.  His blood sugars are all less than 180 at night.  His fasting blood sugars are well-controlled.  He is on Jardiance for diabetes which also contribute to [facility] he had to stop form due to diarrhea.  We recently performed a CT scan of the abdomen and pelvis with results below.  I performed a prostate exam today which showed diffuse prostatic enlargement and hypertrophy but no nodularity or tenderness.  He is having some low back pain roughly around the level of L3-L4 however CT scan did show significant degenerative disc disease with anterior listhesis in the lower lumbar spine per my report.  FINDINGS: Lower chest: Moderate elevation of left hemidiaphragm. Dense mitral valve annular calcifications are present. The right atrium is dilated.   Hepatobiliary: No focal liver abnormality is seen. Status post cholecystectomy. No biliary dilatation.   Pancreas: Unremarkable. No pancreatic ductal dilatation or surrounding inflammatory changes.   Spleen: Visualized portions of the spleen are unremarkable.   Adrenals/Urinary Tract: Adrenal glands are normal.   Nonobstructing right renal calculus measuring 3 mm. Kidneys and ureters otherwise unremarkable. Focal calcifications noted in the anterior bladder wall at the site of the urachus measuring 3 and 2 mm. Bladder is otherwise normal.   Stomach/Bowel: No bowel dilatation to indicate ileus or obstruction. Appendix is normal. Mild sigmoid diverticulosis without evidence of acute  diverticulitis.   Vascular/Lymphatic: No enlarged abdominal or pelvic lymph nodes. Calcified atheromatous plaque seen throughout the abdominal aorta and visualized branches.   Reproductive: Markedly enlarged prostate again seen with focal low-density structure within the left side measuring 10 mm. This low-density lesion is unchanged since 07/11/2021 which favors a benign etiology.   Other: No abdominal wall hernia or abnormality. No abdominopelvic ascites.   Musculoskeletal: No acute or significant osseous findings.   IMPRESSION: 1. No acute abnormality of the abdomen or pelvis. 2. Mild sigmoid diverticulosis without evidence of acute diverticulitis. 3. Nonobstructing right renal calculus measuring 3 mm. 4. Markedly enlarged prostate.   Past Medical History:  Diagnosis Date   Allergy    grass etc.   Barrett's esophagus    BPH (benign prostatic hyperplasia)    Chest pain, atypical    Chronic kidney disease    kidney stones   Coronary artery disease    GERD (gastroesophageal reflux disease)    Barrett's   Hernia    Hyperlipidemia    Hypertension    Myocardial infarction (HCC)    Thrombocytopenia (HCC)    Ulcer 1960   Past Surgical History:  Procedure Laterality Date   CARDIAC CATHETERIZATION  02/14/2007   MITRAL REGURGITATION. LV NORMAL   CHOLECYSTECTOMY     COLONOSCOPY     CORONARY ARTERY BYPASS GRAFT     twice  2 by passes each time   INGUINAL HERNIA REPAIR     bilateral done twice   LITHOTRIPSY     SKIN CANCER EXCISION  UPPER GASTROINTESTINAL ENDOSCOPY     Current Outpatient Medications on File Prior to Visit  Medication Sig Dispense Refill   Accu-Chek Softclix Lancets lancets TEST BLOOD SUGAR ONE TIME DAILY 100 each 1   Alcohol Swabs (DROPSAFE ALCOHOL PREP) 70 % PADS USE TOPICALLY ONE TIME DAILY 100 each 1   AMBULATORY NON FORMULARY MEDICATION Take 1 capsule by mouth in the morning, at noon, and at bedtime. Heather's Tummy Tamers IBS, Peppermint oil  with ginger and fennel     amLODipine (NORVASC) 5 MG tablet TAKE 1 TABLET EVERY DAY 90 tablet 0   aspirin 81 MG tablet Take 81 mg by mouth daily.     augmented betamethasone dipropionate (DIPROLENE-AF) 0.05 % cream Apply topically.     Blood Glucose Monitoring Suppl (ACCU-CHEK GUIDE ME) w/Device KIT Use as instructed to monitor FSBS 1x daily. Dx: E11.9 1 kit 1   cetirizine (ZYRTEC) 10 MG tablet Take 1 tablet (10 mg total) by mouth daily. 30 tablet 0   Cholecalciferol (VITAMIN D) 2000 units CAPS Take 1 capsule by mouth daily.     cholestyramine (QUESTRAN) 4 g packet Take 1 packet (4 g total) by mouth 2 (two) times daily. 60 each 2   clobetasol cream (TEMOVATE) 0.05 % Apply 1 Application topically 2 (two) times daily.     empagliflozin (JARDIANCE) 25 MG TABS tablet Take 1 tablet (25 mg total) by mouth daily before breakfast. 30 tablet 11   finasteride (PROSCAR) 5 MG tablet TAKE 1 TABLET(5 MG) BY MOUTH DAILY. 90 tablet 3   glucose blood (ACCU-CHEK GUIDE) test strip TEST BLOOD SUGAR ONE TIME DAILY AS DIRECTED 100 strip 0   lansoprazole (PREVACID) 30 MG capsule TAKE 1 CAPSULE EVERY DAY 90 capsule 1   metoprolol succinate (TOPROL-XL) 25 MG 24 hr tablet TAKE 1/2 TABLET EVERY DAY 45 tablet 0   Multiple Vitamin (MULTIVITAMIN) tablet Take 1 tablet by mouth daily.     Na Sulfate-K Sulfate-Mg Sulf (SUPREP BOWEL PREP KIT) 17.5-3.13-1.6 GM/177ML SOLN Take 1 kit by mouth as directed. For colonoscopy prep 354 mL 0   nitroGLYCERIN (NITROSTAT) 0.4 MG SL tablet Dissolve 1 tablet under the tongue every 5 minutes as needed for chest pain. Max of 3 doses, then 911. 75 tablet 3   olopatadine (PATADAY) 0.1 % ophthalmic solution Place 1 drop into both eyes 2 (two) times daily. 5 mL 12   Omega-3 Fatty Acids (FISH OIL) 1000 MG CPDR Take 1,000 mg by mouth every morning.     simvastatin (ZOCOR) 10 MG tablet TAKE 1 TABLET AT BEDTIME 90 tablet 3   tamsulosin (FLOMAX) 0.4 MG CAPS capsule TAKE 1 CAPSULE EVERY DAY 90 capsule 3    tiZANidine (ZANAFLEX) 2 MG tablet Take 1 tablet (2 mg total) by mouth every 6 (six) hours as needed for muscle spasms. 30 tablet 1   No current facility-administered medications on file prior to visit.   Allergies  Allergen Reactions   Imdur [Isosorbide Mononitrate]     Unknown, per pt    Meloxicam Swelling    Facial swelling    Social History   Socioeconomic History   Marital status: Married    Spouse name: Mary   Number of children: 2   Years of education: Not on file   Highest education level: Not on file  Occupational History   Occupation: Retired  Tobacco Use   Smoking status: Never   Smokeless tobacco: Never  Vaping Use   Vaping Use: Never used  Substance and Sexual  Activity   Alcohol use: No   Drug use: No   Sexual activity: Not Currently  Other Topics Concern   Not on file  Social History Narrative   Married in 1961.   6 grandchildren   Social Determinants of Health   Financial Resource Strain: Low Risk  (12/08/2022)   Overall Financial Resource Strain (CARDIA)    Difficulty of Paying Living Expenses: Not hard at all  Food Insecurity: No Food Insecurity (12/08/2022)   Hunger Vital Sign    Worried About Running Out of Food in the Last Year: Never true    Ran Out of Food in the Last Year: Never true  Transportation Needs: No Transportation Needs (12/08/2022)   PRAPARE - Administrator, Civil Service (Medical): No    Lack of Transportation (Non-Medical): No  Physical Activity: Sufficiently Active (12/08/2022)   Exercise Vital Sign    Days of Exercise per Week: 5 days    Minutes of Exercise per Session: 30 min  Stress: No Stress Concern Present (12/08/2022)   [employer] of Occupational Health - Occupational Stress Questionnaire    Feeling of Stress : Not at all  Social Connections: Socially Integrated (12/08/2022)   Social Connection and Isolation Panel [NHANES]    Frequency of Communication with Friends and Family: More than three times a  week    Frequency of Social Gatherings with Friends and Family: Three times a week    Attends Religious Services: More than 4 times per year    Active Member of Clubs or Organizations: Yes    Attends Banker Meetings: More than 4 times per year    Marital Status: Married  Catering manager Violence: Not At Risk (12/08/2022)   Humiliation, Afraid, Rape, and Kick questionnaire    Fear of Current or Ex-Partner: No    Emotionally Abused: No    Physically Abused: No    Sexually Abused: No      Review of Systems  All other systems reviewed and are negative.      Objective:   Physical Exam Vitals reviewed.  Constitutional:      General: He is not in acute distress.    Appearance: Normal appearance. He is not ill-appearing or toxic-appearing.  Cardiovascular:     Rate and Rhythm: Normal rate and regular rhythm.     Heart sounds: Normal heart sounds. No murmur heard.    No friction rub. No gallop.  Pulmonary:     Effort: Pulmonary effort is normal. No respiratory distress.     Breath sounds: Normal breath sounds and air entry. No stridor, decreased air movement or transmitted upper airway sounds. No wheezing, rhonchi or rales.  Abdominal:     General: Bowel sounds are normal.     Palpations: Abdomen is soft.  Genitourinary:    Prostate: Enlarged. Not tender and no nodules present.  Musculoskeletal:     Right lower leg: No edema.     Left lower leg: No edema.  Neurological:     Mental Status: He is alert.          Assessment & Plan:   Controlled type 2 diabetes mellitus with complication, without long-term current use of insulin (HCC) - Plan: Hemoglobin A1c, CBC with Differential/Platelet, COMPLETE METABOLIC PANEL WITH GFR, Lipid panel  Weight loss - Plan: TSH, Urinalysis, Routine w reflex microscopic, PSA Because of the loose stool I recommended that he add fiber.  The frequency is much better.  However hopefully the fiber will  help with the consistency.   Because of the weight loss I want him to stop Jardiance.  As long as his blood sugars are under 200 I can except at this point in his life.  We will get his fasting lab work today.  I will check a PSA however his rectal exam shows BPH.  I suspect the weight loss is time primarily to the diarrhea and so I want to wait for results of the colonoscopy.

## 2023-04-04 LAB — CBC WITH DIFFERENTIAL/PLATELET
Absolute Monocytes: 540 cells/uL (ref 200–950)
Basophils Absolute: 43 cells/uL (ref 0–200)
Basophils Relative: 0.6 %
Eosinophils Absolute: 92 cells/uL (ref 15–500)
Eosinophils Relative: 1.3 %
HCT: 45.1 % (ref 38.5–50.0)
Hemoglobin: 15 g/dL (ref 13.2–17.1)
Lymphs Abs: 1463 cells/uL (ref 850–3900)
MCH: 30 pg (ref 27.0–33.0)
MCHC: 33.3 g/dL (ref 32.0–36.0)
MCV: 90.2 fL (ref 80.0–100.0)
MPV: 10.4 fL (ref 7.5–12.5)
Monocytes Relative: 7.6 %
Neutro Abs: 4963 cells/uL (ref 1500–7800)
Neutrophils Relative %: 69.9 %
Platelets: 57 10*3/uL — ABNORMAL LOW (ref 140–400)
RBC: 5 10*6/uL (ref 4.20–5.80)
RDW: 12.2 % (ref 11.0–15.0)
Total Lymphocyte: 20.6 %
WBC: 7.1 10*3/uL (ref 3.8–10.8)

## 2023-04-04 LAB — URINALYSIS, ROUTINE W REFLEX MICROSCOPIC
Bilirubin Urine: NEGATIVE
Hgb urine dipstick: NEGATIVE
Ketones, ur: NEGATIVE
Leukocytes,Ua: NEGATIVE
Nitrite: NEGATIVE
Protein, ur: NEGATIVE
Specific Gravity, Urine: 1.023 (ref 1.001–1.035)
pH: <=5 (ref 5.0–8.0)

## 2023-04-04 LAB — COMPLETE METABOLIC PANEL WITH GFR
AG Ratio: 2.2 (calc) (ref 1.0–2.5)
ALT: 12 U/L (ref 9–46)
AST: 13 U/L (ref 10–35)
Albumin: 4.3 g/dL (ref 3.6–5.1)
Alkaline phosphatase (APISO): 59 U/L (ref 35–144)
BUN: 20 mg/dL (ref 7–25)
CO2: 27 mmol/L (ref 20–32)
Calcium: 9.4 mg/dL (ref 8.6–10.3)
Chloride: 103 mmol/L (ref 98–110)
Creat: 0.86 mg/dL (ref 0.70–1.22)
Globulin: 2 g/dL (calc) (ref 1.9–3.7)
Glucose, Bld: 134 mg/dL — ABNORMAL HIGH (ref 65–99)
Potassium: 4 mmol/L (ref 3.5–5.3)
Sodium: 141 mmol/L (ref 135–146)
Total Bilirubin: 0.7 mg/dL (ref 0.2–1.2)
Total Protein: 6.3 g/dL (ref 6.1–8.1)
eGFR: 85 mL/min/{1.73_m2} (ref >=60)

## 2023-04-04 LAB — HEMOGLOBIN A1C
Hgb A1c MFr Bld: 6.5 % of total Hgb — ABNORMAL HIGH (ref <5.7)
Mean Plasma Glucose: 140 mg/dL
eAG (mmol/L): 7.7 mmol/L

## 2023-04-04 LAB — LIPID PANEL
Cholesterol: 104 mg/dL (ref <200)
HDL: 50 mg/dL (ref >=40)
LDL Cholesterol (Calc): 37 mg/dL (calc)
Non-HDL Cholesterol (Calc): 54 mg/dL (calc) (ref <130)
Total CHOL/HDL Ratio: 2.1 (calc) (ref <5.0)
Triglycerides: 88 mg/dL (ref <150)

## 2023-04-04 LAB — EXTRA URINE SPECIMEN

## 2023-04-04 LAB — TSH: TSH: 2.71 mIU/L (ref 0.40–4.50)

## 2023-04-04 LAB — PSA: PSA: 0.23 ng/mL (ref <=4.00)

## 2023-04-06 ENCOUNTER — Ambulatory Visit (AMBULATORY_SURGERY_CENTER): Payer: Medicare HMO | Admitting: Gastroenterology

## 2023-04-06 ENCOUNTER — Encounter: Payer: Self-pay | Admitting: Gastroenterology

## 2023-04-06 VITALS — BP 140/66 | HR 64 | Temp 98.0°F | Resp 15 | Ht 66.0 in | Wt 140.0 lb

## 2023-04-06 DIAGNOSIS — D123 Benign neoplasm of transverse colon: Secondary | ICD-10-CM

## 2023-04-06 DIAGNOSIS — K529 Noninfective gastroenteritis and colitis, unspecified: Secondary | ICD-10-CM | POA: Diagnosis not present

## 2023-04-06 DIAGNOSIS — K449 Diaphragmatic hernia without obstruction or gangrene: Secondary | ICD-10-CM

## 2023-04-06 DIAGNOSIS — E119 Type 2 diabetes mellitus without complications: Secondary | ICD-10-CM | POA: Diagnosis not present

## 2023-04-06 DIAGNOSIS — R194 Change in bowel habit: Secondary | ICD-10-CM | POA: Diagnosis not present

## 2023-04-06 DIAGNOSIS — R634 Abnormal weight loss: Secondary | ICD-10-CM | POA: Diagnosis not present

## 2023-04-06 DIAGNOSIS — D124 Benign neoplasm of descending colon: Secondary | ICD-10-CM | POA: Diagnosis not present

## 2023-04-06 DIAGNOSIS — R197 Diarrhea, unspecified: Secondary | ICD-10-CM

## 2023-04-06 DIAGNOSIS — E785 Hyperlipidemia, unspecified: Secondary | ICD-10-CM | POA: Diagnosis not present

## 2023-04-06 DIAGNOSIS — K319 Disease of stomach and duodenum, unspecified: Secondary | ICD-10-CM | POA: Diagnosis not present

## 2023-04-06 DIAGNOSIS — I10 Essential (primary) hypertension: Secondary | ICD-10-CM | POA: Diagnosis not present

## 2023-04-06 MED ORDER — SODIUM CHLORIDE 0.9 % IV SOLN
500.0000 mL | Freq: Once | INTRAVENOUS | Status: DC
Start: 2023-04-06 — End: 2023-04-06

## 2023-04-06 NOTE — Progress Notes (Signed)
Called to room to assist during endoscopic procedure.  Patient ID and intended procedure confirmed with present staff. Received instructions for my participation in the procedure from the performing physician.  

## 2023-04-06 NOTE — Op Note (Signed)
[facility] Patient Name: Andrew [name] Procedure Date: 04/06/2023 10:16 AM MRN: [id number] Endoscopist: Viviann Spare P. Adela Lank , MD, [id number] Age: 82 Referring MD:  Date of Birth: 31-Jul-1941 Gender: Male Account #: [id number] Procedure:                Upper GI endoscopy Indications:              Diarrhea, Weight loss Medicines:                Monitored Anesthesia Care Procedure:                Pre-Anesthesia Assessment:                           - Prior to the procedure, a History and Physical                            was performed, and patient medications and                            allergies were reviewed. The patient's tolerance of                            previous anesthesia was also reviewed. The risks                            and benefits of the procedure and the sedation                            options and risks were discussed with the patient.                            All questions were answered, and informed consent                            was obtained. Prior Anticoagulants: The patient has                            taken no anticoagulant or antiplatelet agents. ASA                            Grade Assessment: III - A patient with severe                            systemic disease. After reviewing the risks and                            benefits, the patient was deemed in satisfactory                            condition to undergo the procedure.                           After obtaining informed consent, the endoscope was  passed under direct vision. Throughout the                            procedure, the patient's blood pressure, pulse, and                            oxygen saturations were monitored continuously. The                            Olympus scope [id number] was introduced through the                            mouth, and advanced to the second part of duodenum.                            The upper GI endoscopy  was accomplished without                            difficulty. The patient tolerated the procedure                            well. Scope In: Scope Out: Findings:                 Esophagogastric landmarks were identified: the                            Z-line was found at 40 cm, the gastroesophageal                            junction was found at 40 cm and the upper extent of                            the gastric folds was found at 41 cm from the                            incisors.                           A 1 cm hiatal hernia was present.                           A single area of ectopic gastric mucosa was found                            in the upper third of the esophagus.                           The exam of the esophagus was otherwise normal.                           Patchy mildly erythematous mucosa was found in the  gastric body.                           The exam of the stomach was otherwise normal.                           Biopsies were taken with a cold forceps for                            Helicobacter pylori testing.                           The examined duodenum was normal. Biopsies for                            histology were taken with a cold forceps for                            evaluation of celiac disease. Complications:            No immediate complications. Estimated blood loss:                            Minimal. Estimated Blood Loss:     Estimated blood loss was minimal. Impression:               - Esophagogastric landmarks identified.                           - 1 cm hiatal hernia.                           - Ectopic gastric mucosa in the upper third of the                            esophagus.                           - Erythematous mucosa in the gastric body.                           - Normal examined duodenum. Biopsied.                           - Biopsies were taken with a cold forceps for                             Helicobacter pylori testing. Recommendation:           - Patient has a contact number available for                            emergencies. The signs and symptoms of potential                            delayed complications were discussed with the  patient. Return to normal activities tomorrow.                            Written discharge instructions were provided to the                            patient.                           - Resume previous diet.                           - Continue present medications.                           - Await pathology results. Viviann Spare P. Adela Lank, MD 04/06/2023 5:43:22 PM This report has been signed electronically.

## 2023-04-06 NOTE — Patient Instructions (Addendum)
You may resume all of your present medications today.  Read all of the handouts given to you  by your recovery room nurse.The doctor did biopsies of your colon to see if you have an infection.  He also biopsied your stomach and your small intestine to see if you have any other issues.  You had two colon polyps which we will send to lab.  They were small and nothing to worry about.  You have diverticulosis.  They are pouches in your colon.  They are also nothing to worry about and very common.  YOU HAD AN ENDOSCOPIC PROCEDURE TODAY AT THE [facility]:   Refer to the procedure report that was given to you for any specific questions about what was found during the examination.  If the procedure report does not answer your questions, please call your gastroenterologist to clarify.  If you requested that your care partner not be given the details of your procedure findings, then the procedure report has been included in a sealed envelope for you to review at your convenience later.  YOU SHOULD EXPECT: Some feelings of bloating in the abdomen. Passage of more gas than usual.  Walking can help get rid of the air that was put into your GI tract during the procedure and reduce the bloating. If you had a lower endoscopy (such as a colonoscopy or flexible sigmoidoscopy) you may notice spotting of blood in your stool or on the toilet paper. If you underwent a bowel prep for your procedure, you may not have a normal bowel movement for a few days.  Please Note:  You might notice some irritation and congestion in your nose or some drainage.  This is from the oxygen used during your procedure.  There is no need for concern and it should clear up in a day or so.  SYMPTOMS TO REPORT IMMEDIATELY:  Following lower endoscopy (colonoscopy or flexible sigmoidoscopy):  Excessive amounts of blood in the stool  Significant tenderness or worsening of abdominal pains  Swelling of the abdomen that is new,  acute  Fever of 100F or higher  Following upper endoscopy (EGD)  Vomiting of blood or coffee ground material  New chest pain or pain under the shoulder blades  Painful or persistently difficult swallowing  New shortness of breath  Fever of 100F or higher  Black, tarry-looking stools  For urgent or emergent issues, a gastroenterologist can be reached at any hour by calling [phone]. Do not use MyChart messaging for urgent concerns.    DIET:  We do recommend a small meal at first, but then you may proceed to your regular diet.  Drink plenty of fluids but you should avoid alcoholic beverages for 24 hours. I would suggest a soft diet for your first meal since you had a tube down your throat.   Nothing spicy. Nothing gassy.  You may have sugar free ice cream a little later today.    ACTIVITY:  You should plan to take it easy for the rest of today and you should NOT DRIVE or use heavy machinery until tomorrow (because of the sedation medicines used during the test).    FOLLOW UP: Our staff will call the number listed on your records the next business day following your procedure.  We will call around 7:15- 8:00 am to check on you and address any questions or concerns that you may have regarding the information given to you following your procedure. If we do not reach you,  we will leave a message.     If any biopsies were taken you will be contacted by phone or by letter within the next 1-3 weeks.  Please call us at [phone] if you have not heard about the biopsies in 3 weeks.    SIGNATURES/CONFIDENTIALITY: You and/or your care partner have signed paperwork which will be entered into your electronic medical record.  These signatures attest to the fact that that the information above on your After Visit Summary has been reviewed and is understood.  Full responsibility of the confidentiality of this discharge information lies with you and/or your care-partner.

## 2023-04-06 NOTE — Progress Notes (Signed)
A and O x3. Report to RN. Tolerated MAC anesthesia well.Teeth unchanged after procedure. 

## 2023-04-06 NOTE — Progress Notes (Signed)
[facility] History and Physical   Primary Care Physician:  Donita Brooks, MD   Reason for Procedure:   Chronic diarrhea, weight loss  Plan:    EGD and colonoscopy     HPI: Andrew [name] is a 82 y.o. male  here for EGD and colonoscopy to evaluate symptoms of chronic diarrhea / weight loss. CT scan negative. Historically had an evaluation for this in 09/2018 - biopsies showed no microscopic colitis. Stool testing negative. Had got better on cholestyramine but stopped it. Has since been resumed during clinic visit 02/16/23 - diarrhea somewhat better but continues to lose weight - 9 more lbs since April. Chronic thrombocytopenia noted, baseline plt > 50. On PRevacid for history of GERD.    Otherwise feels well without any cardiopulmonary symptoms.   I have discussed risks / benefits of anesthesia and endoscopic procedure with [name] and they wish to proceed with the exams as outlined today.    Past Medical History:  Diagnosis Date   Allergy    grass etc.   Barrett's esophagus    BPH (benign prostatic hyperplasia)    Chest pain, atypical    Chronic kidney disease    kidney stones   Coronary artery disease    Diabetes mellitus without complication (HCC)    GERD (gastroesophageal reflux disease)    Barrett's   Hernia    Hyperlipidemia    Hypertension    Myocardial infarction (HCC)    Thrombocytopenia (HCC)    Ulcer 1960    Past Surgical History:  Procedure Laterality Date   CARDIAC CATHETERIZATION  02/14/2007   MITRAL REGURGITATION. LV NORMAL   CHOLECYSTECTOMY     COLONOSCOPY     CORONARY ARTERY BYPASS GRAFT     twice  2 by passes each time   INGUINAL HERNIA REPAIR     bilateral done twice   LITHOTRIPSY     SKIN CANCER EXCISION     UPPER GASTROINTESTINAL ENDOSCOPY      Prior to Admission medications   Medication Sig Start Date End Date Taking? Authorizing Provider  Accu-Chek Softclix Lancets lancets TEST BLOOD SUGAR ONE TIME DAILY 04/03/22  Yes  Donita Brooks, MD  Alcohol Swabs (DROPSAFE ALCOHOL PREP) 70 % PADS USE TOPICALLY ONE TIME DAILY 04/03/22  Yes Donita Brooks, MD  aspirin 81 MG tablet Take 81 mg by mouth daily.   Yes [provider]  Blood Glucose Monitoring Suppl (ACCU-CHEK GUIDE ME) w/Device KIT Use as instructed to monitor FSBS 1x daily. Dx: E11.9 07/15/21  Yes Donita Brooks, MD  Cholecalciferol (VITAMIN D) 2000 units CAPS Take 1 capsule by mouth daily.   Yes [provider]  cholestyramine (QUESTRAN) 4 g packet Take 1 packet (4 g total) by mouth 2 (two) times daily. 02/16/23  Yes Unk Lightning, PA  empagliflozin (JARDIANCE) 25 MG TABS tablet Take 1 tablet (25 mg total) by mouth daily before breakfast. 12/17/22  Yes Donita Brooks, MD  finasteride (PROSCAR) 5 MG tablet TAKE 1 TABLET(5 MG) BY MOUTH DAILY. 10/22/20  Yes Donita Brooks, MD  glucose blood (ACCU-CHEK GUIDE) test strip TEST BLOOD SUGAR ONE TIME DAILY AS DIRECTED 04/03/22  Yes Donita Brooks, MD  lansoprazole (PREVACID) 30 MG capsule TAKE 1 CAPSULE EVERY DAY 01/25/23  Yes Donita Brooks, MD  metoprolol succinate (TOPROL-XL) 25 MG 24 hr tablet TAKE 1/2 TABLET EVERY DAY 04/01/23  Yes Donita Brooks, MD  Multiple Vitamin (MULTIVITAMIN) tablet Take 1 tablet  by mouth daily.   Yes [provider]  Omega-3 Fatty Acids (FISH OIL) 1000 MG CPDR Take 1,000 mg by mouth every morning.   Yes [provider]  simvastatin (ZOCOR) 10 MG tablet TAKE 1 TABLET AT BEDTIME 03/30/22  Yes Donita Brooks, MD  tamsulosin (FLOMAX) 0.4 MG CAPS capsule TAKE 1 CAPSULE EVERY DAY 10/15/22  Yes Donita Brooks, MD  AMBULATORY NON FORMULARY MEDICATION Take 1 capsule by mouth in the morning, at noon, and at bedtime. Heather's Tummy Tamers IBS, Peppermint oil with ginger and fennel    [provider]  amLODipine (NORVASC) 5 MG tablet TAKE 1 TABLET EVERY DAY 03/18/23   Donita Brooks, MD  augmented betamethasone dipropionate  (DIPROLENE-AF) 0.05 % cream Apply topically. 09/01/22   [provider]  cetirizine (ZYRTEC) 10 MG tablet Take 1 tablet (10 mg total) by mouth daily. 01/07/23   Leath-Warren, Sadie Haber, NP  clobetasol cream (TEMOVATE) 0.05 % Apply 1 Application topically 2 (two) times daily.    [provider]  nitroGLYCERIN (NITROSTAT) 0.4 MG SL tablet Dissolve 1 tablet under the tongue every 5 minutes as needed for chest pain. Max of 3 doses, then 911. 06/02/22   Nahser, Deloris Ping, MD  olopatadine (PATADAY) 0.1 % ophthalmic solution Place 1 drop into both eyes 2 (two) times daily. 01/15/22   Donita Brooks, MD  tiZANidine (ZANAFLEX) 2 MG tablet Take 1 tablet (2 mg total) by mouth every 6 (six) hours as needed for muscle spasms. 04/28/22   Donita Brooks, MD    Current Outpatient Medications  Medication Sig Dispense Refill   Accu-Chek Softclix Lancets lancets TEST BLOOD SUGAR ONE TIME DAILY 100 each 1   Alcohol Swabs (DROPSAFE ALCOHOL PREP) 70 % PADS USE TOPICALLY ONE TIME DAILY 100 each 1   aspirin 81 MG tablet Take 81 mg by mouth daily.     Blood Glucose Monitoring Suppl (ACCU-CHEK GUIDE ME) w/Device KIT Use as instructed to monitor FSBS 1x daily. Dx: E11.9 1 kit 1   Cholecalciferol (VITAMIN D) 2000 units CAPS Take 1 capsule by mouth daily.     cholestyramine (QUESTRAN) 4 g packet Take 1 packet (4 g total) by mouth 2 (two) times daily. 60 each 2   empagliflozin (JARDIANCE) 25 MG TABS tablet Take 1 tablet (25 mg total) by mouth daily before breakfast. 30 tablet 11   finasteride (PROSCAR) 5 MG tablet TAKE 1 TABLET(5 MG) BY MOUTH DAILY. 90 tablet 3   glucose blood (ACCU-CHEK GUIDE) test strip TEST BLOOD SUGAR ONE TIME DAILY AS DIRECTED 100 strip 0   lansoprazole (PREVACID) 30 MG capsule TAKE 1 CAPSULE EVERY DAY 90 capsule 1   metoprolol succinate (TOPROL-XL) 25 MG 24 hr tablet TAKE 1/2 TABLET EVERY DAY 45 tablet 0   Multiple Vitamin (MULTIVITAMIN) tablet Take 1 tablet by mouth daily.      Omega-3 Fatty Acids (FISH OIL) 1000 MG CPDR Take 1,000 mg by mouth every morning.     simvastatin (ZOCOR) 10 MG tablet TAKE 1 TABLET AT BEDTIME 90 tablet 3   tamsulosin (FLOMAX) 0.4 MG CAPS capsule TAKE 1 CAPSULE EVERY DAY 90 capsule 3   AMBULATORY NON FORMULARY MEDICATION Take 1 capsule by mouth in the morning, at noon, and at bedtime. Heather's Tummy Tamers IBS, Peppermint oil with ginger and fennel     amLODipine (NORVASC) 5 MG tablet TAKE 1 TABLET EVERY DAY 90 tablet 0   augmented betamethasone dipropionate (DIPROLENE-AF) 0.05 % cream Apply topically.  cetirizine (ZYRTEC) 10 MG tablet Take 1 tablet (10 mg total) by mouth daily. 30 tablet 0   clobetasol cream (TEMOVATE) 0.05 % Apply 1 Application topically 2 (two) times daily.     nitroGLYCERIN (NITROSTAT) 0.4 MG SL tablet Dissolve 1 tablet under the tongue every 5 minutes as needed for chest pain. Max of 3 doses, then 911. 75 tablet 3   olopatadine (PATADAY) 0.1 % ophthalmic solution Place 1 drop into both eyes 2 (two) times daily. 5 mL 12   tiZANidine (ZANAFLEX) 2 MG tablet Take 1 tablet (2 mg total) by mouth every 6 (six) hours as needed for muscle spasms. 30 tablet 1   Current Facility-Administered Medications  Medication Dose Route Frequency Provider Last Rate Last Admin   0.9 %  sodium chloride infusion  500 mL Intravenous Once Thera Basden, Willaim Rayas, MD        Allergies as of 04/06/2023 - Review Complete 04/06/2023  Allergen Reaction Noted   Imdur [isosorbide mononitrate]  04/01/2011   Meloxicam Swelling 12/27/2010    Family History  Problem Relation Age of Onset   Stroke Father    Heart attack Mother    Heart attack Maternal Grandmother    Heart attack Maternal Grandfather    Heart disease Sister    Heart disease Brother    Heart disease Sister    Heart disease Brother    Prostate cancer Brother    Colon cancer Neg Hx    Esophageal cancer Neg Hx    Rectal cancer Neg Hx    Stomach cancer Neg Hx     Social History    Socioeconomic History   Marital status: Married    Spouse name: Mary   Number of children: 2   Years of education: Not on file   Highest education level: Not on file  Occupational History   Occupation: Retired  Tobacco Use   Smoking status: Never   Smokeless tobacco: Never  Vaping Use   Vaping Use: Never used  Substance and Sexual Activity   Alcohol use: No   Drug use: No   Sexual activity: Not Currently  Other Topics Concern   Not on file  Social History Narrative   Married in 1961.   6 grandchildren   Social Determinants of Health   Financial Resource Strain: Low Risk  (12/08/2022)   Overall Financial Resource Strain (CARDIA)    Difficulty of Paying Living Expenses: Not hard at all  Food Insecurity: No Food Insecurity (12/08/2022)   Hunger Vital Sign    Worried About Running Out of Food in the Last Year: Never true    Ran Out of Food in the Last Year: Never true  Transportation Needs: No Transportation Needs (12/08/2022)   PRAPARE - Administrator, Civil Service (Medical): No    Lack of Transportation (Non-Medical): No  Physical Activity: Sufficiently Active (12/08/2022)   Exercise Vital Sign    Days of Exercise per Week: 5 days    Minutes of Exercise per Session: 30 min  Stress: No Stress Concern Present (12/08/2022)   [employer] of Occupational Health - Occupational Stress Questionnaire    Feeling of Stress : Not at all  Social Connections: Socially Integrated (12/08/2022)   Social Connection and Isolation Panel [NHANES]    Frequency of Communication with Friends and Family: More than three times a week    Frequency of Social Gatherings with Friends and Family: Three times a week    Attends Religious Services: More than 4  times per year    Active Member of Clubs or Organizations: Yes    Attends Banker Meetings: More than 4 times per year    Marital Status: Married  Catering manager Violence: Not At Risk (12/08/2022)   Humiliation,  Afraid, Rape, and Kick questionnaire    Fear of Current or Ex-Partner: No    Emotionally Abused: No    Physically Abused: No    Sexually Abused: No    Review of Systems: All other review of systems negative except as mentioned in the HPI.  Physical Exam: Vital signs BP 130/71   Pulse 73   Temp 98 F (36.7 C)   Ht 5\' 6"  (1.676 m)   Wt 140 lb (63.5 kg)   SpO2 98%   BMI 22.60 kg/m   General:   Alert,  Well-developed, pleasant and cooperative in NAD Lungs:  Clear throughout to auscultation.   Heart:  Regular rate and rhythm Abdomen:  Soft, nontender and nondistended.   Neuro/Psych:  Alert and cooperative. Normal mood and affect. A and O x 3  Harlin Rain, MD [facility]

## 2023-04-06 NOTE — Op Note (Signed)
[facility] Patient Name: Andrew [name] Procedure Date: 04/06/2023 10:15 AM MRN: [id number] Endoscopist: Viviann Spare P. Adela Lank , MD, [id number] Age: 82 Referring MD:  Date of Birth: December 01, 1940 Gender: Male Account #: [id number] Procedure:                Colonoscopy Indications:              Chronic diarrhea, Weight loss Medicines:                Monitored Anesthesia Care Procedure:                Pre-Anesthesia Assessment:                           - Prior to the procedure, a History and Physical                            was performed, and patient medications and                            allergies were reviewed. The patient's tolerance of                            previous anesthesia was also reviewed. The risks                            and benefits of the procedure and the sedation                            options and risks were discussed with the patient.                            All questions were answered, and informed consent                            was obtained. Prior Anticoagulants: The patient has                            taken no anticoagulant or antiplatelet agents. ASA                            Grade Assessment: III - A patient with severe                            systemic disease. After reviewing the risks and                            benefits, the patient was deemed in satisfactory                            condition to undergo the procedure.                           After obtaining informed consent, the colonoscope  was passed under direct vision. Throughout the                            procedure, the patient's blood pressure, pulse, and                            oxygen saturations were monitored continuously. The                            Olympus [id number]-H190DL [id number]) Colonoscope was                            introduced through the anus and advanced to the the                            terminal ileum, with  identification of the                            appendiceal orifice and IC valve. The colonoscopy                            was performed without difficulty. The patient                            tolerated the procedure well. The quality of the                            bowel preparation was good. The terminal ileum,                            ileocecal valve, appendiceal orifice, and rectum                            were photographed. Scope In: 10:31:10 AM Scope Out: 10:51:46 AM Scope Withdrawal Time: 0 hours 17 minutes 3 seconds  Total Procedure Duration: 0 hours 20 minutes 36 seconds  Findings:                 The perianal and digital rectal examinations were                            normal.                           The terminal ileum appeared normal.                           A 4 mm polyp was found in the hepatic flexure. The                            polyp was sessile. The polyp was removed with a                            cold snare. Resection and retrieval were complete.  A 4 mm polyp was found in the descending colon. The                            polyp was sessile. The polyp was removed with a                            cold snare. Resection and retrieval were complete.                           A few medium-mouthed diverticula were found in the                            sigmoid colon.                           Hemorrhoids were found during retroflexion.                           The exam was otherwise without abnormality.                           Biopsies for histology were taken with a cold                            forceps from the right colon, left colon and                            transverse colon for evaluation of microscopic                            colitis. Complications:            No immediate complications. Estimated blood loss:                            Minimal. Estimated Blood Loss:     Estimated blood loss was  minimal. Impression:               - The examined portion of the ileum was normal.                           - One 4 mm polyp at the hepatic flexure, removed                            with a cold snare. Resected and retrieved.                           - One 4 mm polyp in the descending colon, removed                            with a cold snare. Resected and retrieved.                           - Diverticulosis in the sigmoid colon.                           -  Hemorrhoids.                           - The examination was otherwise normal.                           - Biopsies were taken with a cold forceps from the                            right colon, left colon and transverse colon for                            evaluation of microscopic colitis. Recommendation:           - Patient has a contact number available for                            emergencies. The signs and symptoms of potential                            delayed complications were discussed with the                            patient. Return to normal activities tomorrow.                            Written discharge instructions were provided to the                            patient.                           - Resume previous diet.                           - Continue present medications.                           - Continue cholestyramine if that has helped loose                            stools                           - Await pathology results with further                            recommendations Viviann Spare P. Adela Lank, MD 04/06/2023 5:39:22 PM This report has been signed electronically.

## 2023-04-06 NOTE — Progress Notes (Signed)
Procedure reports:  [employer] / server is down and not able to write report at this time, remarkable findings as below.  EGD - 1cm HH, 40-41cm, normal esophagus. Stomach showed some mild erythema. Biopsies taken to rule out H pylori. Duodenum normal - biopsies taken given chronic diarrhea.  Colonoscopy - normal ileum, small polyp in hepatic flexure and descending colon removed via cold snare. Random biopsies taken to rule out microscopic colitis. Left sided diverticulosis. Hemorrhoids.  Findings will be uploaded to provation once system is working

## 2023-04-07 ENCOUNTER — Telehealth: Payer: Self-pay | Admitting: *Deleted

## 2023-04-07 NOTE — Telephone Encounter (Signed)
Post procedure follow up call placed, no answer and left VM.  

## 2023-04-12 ENCOUNTER — Encounter: Payer: Self-pay | Admitting: Gastroenterology

## 2023-04-14 NOTE — Progress Notes (Signed)
[facility] [address], [place] [address]   CLINIC:  Medical Oncology/Hematology  PCP:  Donita Brooks, MD [address] [place] [address] [phone]   REASON FOR VISIT:  Follow-up for thrombocytopenia  PRIOR THERAPY: None  CURRENT THERAPY: Surveillance  INTERVAL HISTORY:   Andrew [name] 82 y.o. male returns for routine follow-up of thrombocytopenia.  He was last evaluated by virtual visit with NP Consuello Masse on 10/16/2022.  At today's visit, he reports feeling fairly well.  No recent hospitalizations, surgeries, or changes in baseline health status.  He has easy bruising at baseline but denies any severe abnormal bruising or petechial rash. He has not had any bleeding such as epistaxis, hematemesis, hematochezia, melena, hematuria, or gum bleeding. He denies any fever, chills, or night sweats.  He continues to report some unintentional weight loss, with records in EMR showing 10 pound weight loss in the past 6 months.  He denies any changes in oral intake or loss of appetite that would have preceded this.  He does continue to have ongoing diarrhea that he attributes to his medication.  He has 75% energy and 100% appetite.   ASSESSMENT & PLAN:  1.  Mild to moderate thrombocytopenia - Followed at our clinic for intermittent mild to moderate thrombocytopenia - He has had intermittently low platelet count since 2011, ranging from 59 to normal -CTAP on 07/12/2019 shows normal spleen with no hepatic masses.  Normal enlarged lymph nodes. - Nutritional deficiency work-up and connective tissue disorder work-up was negative. - He denies any B symptoms or infections in the last 6 months. - He has easy bruising over the dorsum of the hands due to thinning of the skin.  No bleeding manifestations. - CBC from 04/02/2023 showed platelets 57, but notes that platelet clumps were seen on smear, but that platelet count overall appeared adequate - Labs today  (04/16/2023): Normal platelets 161 - Differential diagnosis favors pseudothrombocytopenia / platelet clumping versus immune mediated thrombocytopenia. - PLAN: Recommend follow-up in 1 year with repeat CBC.  2.  Weight loss, unintentional - Records in EMR show 10 pound weight loss in the past 6 months.  He denies any changes in oral intake or loss of appetite that would have preceded this.  He does continue to have ongoing diarrhea that he attributes to his medication. - PCP workup of weight loss included CT A/P (03/16/2023), which did not show any evidence of malignancy. - PLAN: Continue PCP follow-up for ongoing monitoring and treatment of weight loss.  PLAN SUMMARY: 04/16/2023 >> Labs (CBC/D+ LDH) and OFFICE visit in 1 year     REVIEW OF SYSTEMS:   Review of Systems  Constitutional:  Negative for appetite change, chills, diaphoresis, fatigue, fever and unexpected weight change.  HENT:   Positive for trouble swallowing. Negative for lump/mass and nosebleeds.   Eyes:  Negative for eye problems.  Respiratory:  Negative for cough, hemoptysis and shortness of breath.   Cardiovascular:  Negative for chest pain, leg swelling and palpitations.  Gastrointestinal:  Positive for diarrhea. Negative for abdominal pain, blood in stool, constipation, nausea and vomiting.  Genitourinary:  Negative for hematuria.   Skin: Negative.   Neurological:  Positive for headaches. Negative for dizziness and light-headedness.  Hematological:  Does not bruise/bleed easily.  Psychiatric/Behavioral:  Positive for sleep disturbance.      PHYSICAL EXAM:  ECOG PERFORMANCE STATUS: 1 - Symptomatic but completely ambulatory  Vitals:   04/16/23 0939  BP:  112/64  Pulse: 69  Resp: 16  Temp: 98.8 F (37.1 C)  SpO2: 99%   Filed Weights   04/16/23 0933  Weight: 138 lb 0.1 oz (62.6 kg)   Physical Exam Constitutional:      Appearance: Normal appearance. He is normal weight.  Cardiovascular:     Heart sounds: Normal  heart sounds.  Pulmonary:     Breath sounds: Normal breath sounds.  Neurological:     General: No focal deficit present.     Mental Status: Mental status is at baseline.  Psychiatric:        Behavior: Behavior normal. Behavior is cooperative.     PAST MEDICAL/SURGICAL HISTORY:  Past Medical History:  Diagnosis Date   Allergy    grass etc.   Barrett's esophagus    BPH (benign prostatic hyperplasia)    Chest pain, atypical    Chronic kidney disease    kidney stones   Coronary artery disease    Diabetes mellitus without complication (HCC)    GERD (gastroesophageal reflux disease)    Barrett's   Hernia    Hyperlipidemia    Hypertension    Myocardial infarction (HCC)    Thrombocytopenia (HCC)    Ulcer 1960   Past Surgical History:  Procedure Laterality Date   CARDIAC CATHETERIZATION  02/14/2007   MITRAL REGURGITATION. LV NORMAL   CHOLECYSTECTOMY     COLONOSCOPY     CORONARY ARTERY BYPASS GRAFT     twice  2 by passes each time   INGUINAL HERNIA REPAIR     bilateral done twice   LITHOTRIPSY     SKIN CANCER EXCISION     UPPER GASTROINTESTINAL ENDOSCOPY      SOCIAL HISTORY:  Social History   Socioeconomic History   Marital status: Married    Spouse name: Mary   Number of children: 2   Years of education: Not on file   Highest education level: Not on file  Occupational History   Occupation: Retired  Tobacco Use   Smoking status: Never   Smokeless tobacco: Never  Vaping Use   Vaping Use: Never used  Substance and Sexual Activity   Alcohol use: No   Drug use: No   Sexual activity: Not Currently  Other Topics Concern   Not on file  Social History Narrative   Married in 1961.   6 grandchildren   Social Determinants of Health   Financial Resource Strain: Low Risk  (12/08/2022)   Overall Financial Resource Strain (CARDIA)    Difficulty of Paying Living Expenses: Not hard at all  Food Insecurity: No Food Insecurity (12/08/2022)   Hunger Vital Sign     Worried About Running Out of Food in the Last Year: Never true    Ran Out of Food in the Last Year: Never true  Transportation Needs: No Transportation Needs (12/08/2022)   PRAPARE - Administrator, Civil Service (Medical): No    Lack of Transportation (Non-Medical): No  Physical Activity: Sufficiently Active (12/08/2022)   Exercise Vital Sign    Days of Exercise per Week: 5 days    Minutes of Exercise per Session: 30 min  Stress: No Stress Concern Present (12/08/2022)   [employer] of Occupational Health - Occupational Stress Questionnaire    Feeling of Stress : Not at all  Social Connections: Socially Integrated (12/08/2022)   Social Connection and Isolation Panel [NHANES]    Frequency of Communication with Friends and Family: More than three times a week  Frequency of Social Gatherings with Friends and Family: Three times a week    Attends Religious Services: More than 4 times per year    Active Member of Clubs or Organizations: Yes    Attends Banker Meetings: More than 4 times per year    Marital Status: Married  Catering manager Violence: Not At Risk (12/08/2022)   Humiliation, Afraid, Rape, and Kick questionnaire    Fear of Current or Ex-Partner: No    Emotionally Abused: No    Physically Abused: No    Sexually Abused: No    FAMILY HISTORY:  Family History  Problem Relation Age of Onset   Stroke Father    Heart attack Mother    Heart attack Maternal Grandmother    Heart attack Maternal Grandfather    Heart disease Sister    Heart disease Brother    Heart disease Sister    Heart disease Brother    Prostate cancer Brother    Colon cancer Neg Hx    Esophageal cancer Neg Hx    Rectal cancer Neg Hx    Stomach cancer Neg Hx     CURRENT MEDICATIONS:  Outpatient Encounter Medications as of 04/16/2023  Medication Sig   Accu-Chek Softclix Lancets lancets TEST BLOOD SUGAR ONE TIME DAILY   Alcohol Swabs (DROPSAFE ALCOHOL PREP) 70 % PADS USE  TOPICALLY ONE TIME DAILY   AMBULATORY NON FORMULARY MEDICATION Take 1 capsule by mouth in the morning, at noon, and at bedtime. Heather's Tummy Tamers IBS, Peppermint oil with ginger and fennel   amLODipine (NORVASC) 5 MG tablet TAKE 1 TABLET EVERY DAY   aspirin 81 MG tablet Take 81 mg by mouth daily.   augmented betamethasone dipropionate (DIPROLENE-AF) 0.05 % cream Apply topically.   Blood Glucose Monitoring Suppl (ACCU-CHEK GUIDE ME) w/Device KIT Use as instructed to monitor FSBS 1x daily. Dx: E11.9   cetirizine (ZYRTEC) 10 MG tablet Take 1 tablet (10 mg total) by mouth daily.   Cholecalciferol (VITAMIN D) 2000 units CAPS Take 1 capsule by mouth daily.   cholestyramine (QUESTRAN) 4 g packet Take 1 packet (4 g total) by mouth 2 (two) times daily.   clobetasol cream (TEMOVATE) 0.05 % Apply 1 Application topically 2 (two) times daily.   colesevelam (WELCHOL) 625 MG tablet Take 625 mg by mouth 2 (two) times daily with a meal.   empagliflozin (JARDIANCE) 25 MG TABS tablet Take 1 tablet (25 mg total) by mouth daily before breakfast.   finasteride (PROSCAR) 5 MG tablet TAKE 1 TABLET(5 MG) BY MOUTH DAILY.   glucose blood (ACCU-CHEK GUIDE) test strip TEST BLOOD SUGAR ONE TIME DAILY AS DIRECTED   lansoprazole (PREVACID) 30 MG capsule TAKE 1 CAPSULE EVERY DAY   metoprolol succinate (TOPROL-XL) 25 MG 24 hr tablet TAKE 1/2 TABLET EVERY DAY   Multiple Vitamin (MULTIVITAMIN) tablet Take 1 tablet by mouth daily.   nitroGLYCERIN (NITROSTAT) 0.4 MG SL tablet Dissolve 1 tablet under the tongue every 5 minutes as needed for chest pain. Max of 3 doses, then 911.   olopatadine (PATADAY) 0.1 % ophthalmic solution Place 1 drop into both eyes 2 (two) times daily.   Omega-3 Fatty Acids (FISH OIL) 1000 MG CPDR Take 1,000 mg by mouth every morning.   simvastatin (ZOCOR) 10 MG tablet TAKE 1 TABLET AT BEDTIME   tamsulosin (FLOMAX) 0.4 MG CAPS capsule TAKE 1 CAPSULE EVERY DAY   tiZANidine (ZANAFLEX) 2 MG tablet Take 1  tablet (2 mg total) by mouth every 6 (six)  hours as needed for muscle spasms.   No facility-administered encounter medications on file as of 04/16/2023.    ALLERGIES:  Allergies  Allergen Reactions   Imdur [Isosorbide Mononitrate]     Unknown, per pt    Meloxicam Swelling    Facial swelling     LABORATORY DATA:  I have reviewed the labs as listed.  CBC    Component Value Date/Time   WBC 7.4 04/16/2023 0855   RBC 4.81 04/16/2023 0855   HGB 14.6 04/16/2023 0855   HGB 14.4 05/31/2020 0848   HCT 43.8 04/16/2023 0855   HCT 41.9 05/31/2020 0848   PLT 161 04/16/2023 0855   PLT CANCELED 05/31/2020 0848   MCV 91.1 04/16/2023 0855   MCV 87 05/31/2020 0848   MCH 30.4 04/16/2023 0855   MCHC 33.3 04/16/2023 0855   RDW 12.6 04/16/2023 0855   RDW 12.4 05/31/2020 0848   LYMPHSABS 1.7 04/16/2023 0855   MONOABS 0.6 04/16/2023 0855   EOSABS 0.1 04/16/2023 0855   BASOSABS 0.1 04/16/2023 0855      Latest Ref Rng & Units 04/16/2023    8:55 AM 04/02/2023    8:36 AM 12/17/2022    8:35 AM  CMP  Glucose 70 - 99 mg/dL 161  096  045   BUN 8 - 23 mg/dL 25  20  18    Creatinine 0.61 - 1.24 mg/dL 4.09  8.11  9.14   Sodium 135 - 145 mmol/L 139  141  142   Potassium 3.5 - 5.1 mmol/L 3.6  4.0  4.2   Chloride 98 - 111 mmol/L 108  103  106   CO2 22 - 32 mmol/L 25  27  27    Calcium 8.9 - 10.3 mg/dL 8.6  9.4  9.1   Total Protein 6.5 - 8.1 g/dL 6.5  6.3  5.9   Total Bilirubin 0.3 - 1.2 mg/dL 0.5  0.7  0.6   Alkaline Phos 38 - 126 U/L 54     AST 15 - 41 U/L 16  13  15    ALT 0 - 44 U/L 15  12  17      DIAGNOSTIC IMAGING:  I have independently reviewed the relevant imaging and discussed with the patient.   WRAP UP:  All questions were answered. The patient knows to call the clinic with any problems, questions or concerns.  Medical decision making: Low  Time spent on visit: I spent 15 minutes counseling the patient face to face. The total time spent in the appointment was 22 minutes and more than  50% was on counseling.  Carnella Guadalajara, PA-C  04/16/23 6:48 PM

## 2023-04-16 ENCOUNTER — Other Ambulatory Visit: Payer: Medicare HMO

## 2023-04-16 ENCOUNTER — Inpatient Hospital Stay: Payer: Medicare HMO | Attending: Physician Assistant

## 2023-04-16 ENCOUNTER — Inpatient Hospital Stay (HOSPITAL_BASED_OUTPATIENT_CLINIC_OR_DEPARTMENT_OTHER): Payer: Medicare HMO | Admitting: Physician Assistant

## 2023-04-16 ENCOUNTER — Ambulatory Visit: Payer: Medicare HMO | Admitting: Physician Assistant

## 2023-04-16 VITALS — BP 112/64 | HR 69 | Temp 98.8°F | Resp 16 | Wt 138.0 lb

## 2023-04-16 DIAGNOSIS — D696 Thrombocytopenia, unspecified: Secondary | ICD-10-CM | POA: Insufficient documentation

## 2023-04-16 LAB — CBC WITH DIFFERENTIAL/PLATELET
Abs Immature Granulocytes: 0.01 10*3/uL (ref 0.00–0.07)
Basophils Absolute: 0.1 10*3/uL (ref 0.0–0.1)
Basophils Relative: 1 %
Eosinophils Absolute: 0.1 10*3/uL (ref 0.0–0.5)
Eosinophils Relative: 1 %
HCT: 43.8 % (ref 39.0–52.0)
Hemoglobin: 14.6 g/dL (ref 13.0–17.0)
Immature Granulocytes: 0 %
Lymphocytes Relative: 24 %
Lymphs Abs: 1.7 10*3/uL (ref 0.7–4.0)
MCH: 30.4 pg (ref 26.0–34.0)
MCHC: 33.3 g/dL (ref 30.0–36.0)
MCV: 91.1 fL (ref 80.0–100.0)
Monocytes Absolute: 0.6 10*3/uL (ref 0.1–1.0)
Monocytes Relative: 8 %
Neutro Abs: 4.9 10*3/uL (ref 1.7–7.7)
Neutrophils Relative %: 66 %
Platelets: 161 10*3/uL (ref 150–400)
RBC: 4.81 MIL/uL (ref 4.22–5.81)
RDW: 12.6 % (ref 11.5–15.5)
WBC: 7.4 10*3/uL (ref 4.0–10.5)
nRBC: 0 % (ref 0.0–0.2)

## 2023-04-16 LAB — COMPREHENSIVE METABOLIC PANEL
ALT: 15 U/L (ref 0–44)
AST: 16 U/L (ref 15–41)
Albumin: 3.8 g/dL (ref 3.5–5.0)
Alkaline Phosphatase: 54 U/L (ref 38–126)
Anion gap: 6 (ref 5–15)
BUN: 25 mg/dL — ABNORMAL HIGH (ref 8–23)
CO2: 25 mmol/L (ref 22–32)
Calcium: 8.6 mg/dL — ABNORMAL LOW (ref 8.9–10.3)
Chloride: 108 mmol/L (ref 98–111)
Creatinine, Ser: 0.78 mg/dL (ref 0.61–1.24)
GFR, Estimated: >60 mL/min (ref >60)
Glucose, Bld: 175 mg/dL — ABNORMAL HIGH (ref 70–99)
Potassium: 3.6 mmol/L (ref 3.5–5.1)
Sodium: 139 mmol/L (ref 135–145)
Total Bilirubin: 0.5 mg/dL (ref 0.3–1.2)
Total Protein: 6.5 g/dL (ref 6.5–8.1)

## 2023-04-16 NOTE — Patient Instructions (Signed)
[facility] Discharge Instructions  You were seen today by Rojelio Brenner PA-C for your low platelets.  Your most recent platelet levels were normal.  We will check your labs and see you for follow-up visit again in 1 year.  **Seek IMMEDIATE medical attention if you have any signs of severely low platelets including abnormal bleeding, severe abnormal bruising, severe nosebleeds, bright red blood in the toilet, or black tarry bowel movements.    Thank you for choosing [facility] to provide your oncology and hematology care.  To afford each patient quality time with our provider, please arrive at least 15 minutes before your scheduled appointment time.   If you have a lab appointment with the Cancer Center please come in thru the Main Entrance and check in at the main information desk.  You need to re-schedule your appointment should you arrive 10 or more minutes late.  We strive to give you quality time with our providers, and arriving late affects you and other patients whose appointments are after yours.  Also, if you no show three or more times for appointments you may be dismissed from the clinic at the providers discretion.     Again, thank you for choosing [facility].  Our hope is that these requests will decrease the amount of time that you wait before being seen by our physicians.       _____________________________________________________________  Should you have questions after your visit to [facility], please contact our office at [phone] and follow the prompts.  Our office hours are 8:00 a.m. and 4:30 p.m. Monday - Friday.  Please note that voicemails left after 4:00 p.m. may not be returned until the following business day.  We are closed weekends and major holidays.  You do have access to a nurse 24-7, just call the main number to the clinic [phone] and do not press  any options, hold on the line and a nurse will answer the phone.    For prescription refill requests, have your pharmacy contact our office and allow 72 hours.    Due to Covid, you will need to wear a mask upon entering the hospital. If you do not have a mask, a mask will be given to you at the Main Entrance upon arrival. For doctor visits, patients may have 1 support person age 51 or older with them. For treatment visits, patients can not have anyone with them due to social distancing guidelines and our immunocompromised population.

## 2023-04-21 ENCOUNTER — Other Ambulatory Visit: Payer: Self-pay | Admitting: Family Medicine

## 2023-04-22 DIAGNOSIS — D492 Neoplasm of unspecified behavior of bone, soft tissue, and skin: Secondary | ICD-10-CM | POA: Diagnosis not present

## 2023-04-22 DIAGNOSIS — D225 Melanocytic nevi of trunk: Secondary | ICD-10-CM | POA: Diagnosis not present

## 2023-04-22 DIAGNOSIS — L708 Other acne: Secondary | ICD-10-CM | POA: Diagnosis not present

## 2023-04-22 DIAGNOSIS — L821 Other seborrheic keratosis: Secondary | ICD-10-CM | POA: Diagnosis not present

## 2023-04-22 DIAGNOSIS — L814 Other melanin hyperpigmentation: Secondary | ICD-10-CM | POA: Diagnosis not present

## 2023-04-22 DIAGNOSIS — L570 Actinic keratosis: Secondary | ICD-10-CM | POA: Diagnosis not present

## 2023-04-22 NOTE — Telephone Encounter (Signed)
Requested Prescriptions  Pending Prescriptions Disp Refills   Accu-Chek Softclix Lancets lancets [Pharmacy Med Name: ACCU-CHEK SOFTCLIX LANCETS] 100 each 0    Sig: TEST BLOOD SUGAR ONE TIME DAILY     Endocrinology: Diabetes - Testing Supplies Failed - 04/21/2023 12:56 PM      Failed - Valid encounter within last 12 months    Recent Outpatient Visits           1 year ago LLQ abdominal pain   [facility] Pickard, Priscille Heidelberg, MD   1 year ago Encounter for Medicare annual wellness exam   [facility] Tanya Nones, Priscille Heidelberg, MD   1 year ago Plantar fasciitis   [facility] Donita Brooks, MD   1 year ago Controlled type 2 diabetes mellitus with complication, without long-term current use of insulin (HCC)   [facility] Pickard, Priscille Heidelberg, MD   1 year ago Controlled type 2 diabetes mellitus with complication, without long-term current use of insulin (HCC)   [facility] Pickard, Priscille Heidelberg, MD       Future Appointments             Tomorrow Pickard, Priscille Heidelberg, MD [facility], [facility]   In 2 months Nahser, Deloris Ping, MD [facility]             glucose blood (ACCU-CHEK GUIDE) test strip [place] Med Name: ACCU-CHEK GUIDE   Strip] 100 strip 0    Sig: TEST BLOOD SUGAR ONE TIME DAILY AS DIRECTED     Endocrinology: Diabetes - Testing Supplies Failed - 04/21/2023 12:56 PM      Failed - Valid encounter within last 12 months    Recent Outpatient Visits           1 year ago LLQ abdominal pain   [facility] Tanya Nones, Priscille Heidelberg, MD   1 year ago Encounter for Medicare annual wellness exam   [facility] Pickard, Priscille Heidelberg, MD   1 year ago Plantar fasciitis   [facility] Donita Brooks, MD   1 year ago Controlled type 2 diabetes mellitus with complication, without long-term current use of insulin (HCC)    [facility] Pickard, Priscille Heidelberg, MD   1 year ago Controlled type 2 diabetes mellitus with complication, without long-term current use of insulin (HCC)   [facility] Pickard, Priscille Heidelberg, MD       Future Appointments             Tomorrow Pickard, Priscille Heidelberg, MD [facility], [facility]   In 2 months Nahser, Deloris Ping, MD [facility] at [facility], [facility]             Alcohol Swabs (DROPSAFE ALCOHOL PREP) 70 % PADS [Pharmacy Med Name: DROPSAFE ALCOHOL PREP PADS 70 % Pad] 100 each 0    Sig: USE TOPICALLY ONE TIME DAILY     Off-Protocol Failed - 04/21/2023 12:56 PM      Failed - Medication not assigned to a protocol, review manually.      Failed - Valid encounter within last 12 months    Recent Outpatient Visits           1 year ago LLQ abdominal pain   [facility] Pickard, Priscille Heidelberg, MD   1 year ago Encounter for [facility]  annual wellness exam   [facility] Tanya Nones, Priscille Heidelberg, MD   1 year ago Plantar fasciitis   [facility] Donita Brooks, MD   1 year ago Controlled type 2 diabetes mellitus with complication, without long-term current use of insulin (HCC)   [facility] Donita Brooks, MD   1 year ago Controlled type 2 diabetes mellitus with complication, without long-term current use of insulin (HCC)   [facility] Pickard, Priscille Heidelberg, MD       Future Appointments             Tomorrow Pickard, Priscille Heidelberg, MD  [facility], [facility]   In 2 months Nahser, Deloris Ping, MD [facility] at [facility], [facility]

## 2023-04-23 ENCOUNTER — Encounter: Payer: Self-pay | Admitting: Family Medicine

## 2023-04-23 ENCOUNTER — Ambulatory Visit (INDEPENDENT_AMBULATORY_CARE_PROVIDER_SITE_OTHER): Payer: Medicare HMO | Admitting: Family Medicine

## 2023-04-23 VITALS — BP 116/58 | HR 73 | Temp 97.4°F | Ht 66.0 in | Wt 137.8 lb

## 2023-04-23 DIAGNOSIS — R634 Abnormal weight loss: Secondary | ICD-10-CM | POA: Diagnosis not present

## 2023-04-23 DIAGNOSIS — K529 Noninfective gastroenteritis and colitis, unspecified: Secondary | ICD-10-CM

## 2023-04-23 MED ORDER — PANCRELIPASE (LIP-PROT-AMYL) 36000-114000 UNITS PO CPEP: ORAL_CAPSULE | ORAL | 11 refills | Status: DC

## 2023-04-23 NOTE — Progress Notes (Signed)
Subjective:    Patient ID: Andrew [name], male    DOB: [birth year], 82 y.o.   MRN: [id number] 04/02/23 Patient is concerned because he continues to lose weight.  He continues to have loose stool.  However the frequency is much better.  Is now 1 or 2 a day.  They are loose and runny per his report.  He also has urgency at times.  His blood sugars are all less than 180 at night.  His fasting blood sugars are well-controlled.  He is on Jardiance for diabetes which also contribute to [facility] he had to stop form due to diarrhea.  We recently performed a CT scan of the abdomen and pelvis with results below.  I performed a prostate exam today which showed diffuse prostatic enlargement and hypertrophy but no nodularity or tenderness.  He is having some low back pain roughly around the level of L3-L4 however CT scan did show significant degenerative disc disease with anterior listhesis in the lower lumbar spine per my report.  FINDINGS: Lower chest: Moderate elevation of left hemidiaphragm. Dense mitral valve annular calcifications are present. The right atrium is dilated.   Hepatobiliary: No focal liver abnormality is seen. Status post cholecystectomy. No biliary dilatation.   Pancreas: Unremarkable. No pancreatic ductal dilatation or surrounding inflammatory changes.   Spleen: Visualized portions of the spleen are unremarkable.   Adrenals/Urinary Tract: Adrenal glands are normal.   Nonobstructing right renal calculus measuring 3 mm. Kidneys and ureters otherwise unremarkable. Focal calcifications noted in the anterior bladder wall at the site of the urachus measuring 3 and 2 mm. Bladder is otherwise normal.   Stomach/Bowel: No bowel dilatation to indicate ileus or obstruction. Appendix is normal. Mild sigmoid diverticulosis without evidence of acute diverticulitis.   Vascular/Lymphatic: No enlarged abdominal or pelvic lymph nodes. Calcified atheromatous plaque seen throughout the abdominal  aorta and visualized branches.   Reproductive: Markedly enlarged prostate again seen with focal low-density structure within the left side measuring 10 mm. This low-density lesion is unchanged since 07/11/2021 which favors a benign etiology.   Other: No abdominal wall hernia or abnormality. No abdominopelvic ascites.   Musculoskeletal: No acute or significant osseous findings.   IMPRESSION: 1. No acute abnormality of the abdomen or pelvis. 2. Mild sigmoid diverticulosis without evidence of acute diverticulitis. 3. Nonobstructing right renal calculus measuring 3 mm. 4. Markedly enlarged prostate.  At that time, my plan was:  Because of the loose stool I recommended that he add fiber.  The frequency is much better.  However hopefully the fiber will help with the consistency.  Because of the weight loss I want him to stop Jardiance.  As long as his blood sugars are under 200 I can except at this point in his life.  We will get his fasting lab work today.  I will check a PSA however his rectal exam shows BPH.  I suspect the weight loss is time primarily to the diarrhea and so I want to wait for results of the colonoscopy.  04/23/23 Colonoscopy and endoscopy were unremarkable.  Biopsies were unremarkable.  GI found no explanation for his weight loss or chronic diarrhea.  Therefore the most likely explanation for his chronic diarrhea would be irritable bowel syndrome with diarrhea although I have not worked up the patient for malabsorptive diarrhea due to pancreatic insufficiency.  All lab work has been unremarkable. Wt Readings from Last 3 Encounters:  04/23/23 137 lb 12.8 oz (62.5 kg)  04/16/23 138 lb 0.1  oz (62.6 kg)  04/06/23 140 lb (63.5 kg)    Past Medical History:  Diagnosis Date   Allergy    grass etc.   Barrett's esophagus    BPH (benign prostatic hyperplasia)    Chest pain, atypical    Chronic kidney disease    kidney stones   Coronary artery disease    Diabetes mellitus  without complication (HCC)    GERD (gastroesophageal reflux disease)    Barrett's   Hernia    Hyperlipidemia    Hypertension    Myocardial infarction (HCC)    Thrombocytopenia (HCC)    Ulcer 1960   Past Surgical History:  Procedure Laterality Date   CARDIAC CATHETERIZATION  02/14/2007   MITRAL REGURGITATION. LV NORMAL   CHOLECYSTECTOMY     COLONOSCOPY     CORONARY ARTERY BYPASS GRAFT     twice  2 by passes each time   INGUINAL HERNIA REPAIR     bilateral done twice   LITHOTRIPSY     SKIN CANCER EXCISION     UPPER GASTROINTESTINAL ENDOSCOPY     Current Outpatient Medications on File Prior to Visit  Medication Sig Dispense Refill   Accu-Chek Softclix Lancets lancets TEST BLOOD SUGAR ONE TIME DAILY 100 each 0   Alcohol Swabs (DROPSAFE ALCOHOL PREP) 70 % PADS USE TOPICALLY ONE TIME DAILY 100 each 0   AMBULATORY NON FORMULARY MEDICATION Take 1 capsule by mouth in the morning, at noon, and at bedtime. Heather's Tummy Tamers IBS, Peppermint oil with ginger and fennel     amLODipine (NORVASC) 5 MG tablet TAKE 1 TABLET EVERY DAY 90 tablet 0   aspirin 81 MG tablet Take 81 mg by mouth daily.     augmented betamethasone dipropionate (DIPROLENE-AF) 0.05 % cream Apply topically.     Blood Glucose Monitoring Suppl (ACCU-CHEK GUIDE ME) w/Device KIT Use as instructed to monitor FSBS 1x daily. Dx: E11.9 1 kit 1   cetirizine (ZYRTEC) 10 MG tablet Take 1 tablet (10 mg total) by mouth daily. 30 tablet 0   Cholecalciferol (VITAMIN D) 2000 units CAPS Take 1 capsule by mouth daily.     cholestyramine (QUESTRAN) 4 g packet Take 1 packet (4 g total) by mouth 2 (two) times daily. 60 each 2   clobetasol cream (TEMOVATE) 0.05 % Apply 1 Application topically 2 (two) times daily.     colesevelam (WELCHOL) 625 MG tablet Take 625 mg by mouth 2 (two) times daily with a meal.     empagliflozin (JARDIANCE) 25 MG TABS tablet Take 1 tablet (25 mg total) by mouth daily before breakfast. 30 tablet 11   finasteride  (PROSCAR) 5 MG tablet TAKE 1 TABLET(5 MG) BY MOUTH DAILY. 90 tablet 3   glucose blood (ACCU-CHEK GUIDE) test strip TEST BLOOD SUGAR ONE TIME DAILY AS DIRECTED 100 strip 0   lansoprazole (PREVACID) 30 MG capsule TAKE 1 CAPSULE EVERY DAY 90 capsule 1   metoprolol succinate (TOPROL-XL) 25 MG 24 hr tablet TAKE 1/2 TABLET EVERY DAY 45 tablet 0   Multiple Vitamin (MULTIVITAMIN) tablet Take 1 tablet by mouth daily.     nitroGLYCERIN (NITROSTAT) 0.4 MG SL tablet Dissolve 1 tablet under the tongue every 5 minutes as needed for chest pain. Max of 3 doses, then 911. 75 tablet 3   olopatadine (PATADAY) 0.1 % ophthalmic solution Place 1 drop into both eyes 2 (two) times daily. 5 mL 12   Omega-3 Fatty Acids (FISH OIL) 1000 MG CPDR Take 1,000 mg by mouth every morning.  simvastatin (ZOCOR) 10 MG tablet TAKE 1 TABLET AT BEDTIME 90 tablet 3   tamsulosin (FLOMAX) 0.4 MG CAPS capsule TAKE 1 CAPSULE EVERY DAY 90 capsule 3   tiZANidine (ZANAFLEX) 2 MG tablet Take 1 tablet (2 mg total) by mouth every 6 (six) hours as needed for muscle spasms. 30 tablet 1   No current facility-administered medications on file prior to visit.   Allergies  Allergen Reactions   Imdur [Isosorbide Mononitrate]     Unknown, per pt    Meloxicam Swelling    Facial swelling    Social History   Socioeconomic History   Marital status: Married    Spouse name: Mary   Number of children: 2   Years of education: Not on file   Highest education level: Not on file  Occupational History   Occupation: Retired  Tobacco Use   Smoking status: Never   Smokeless tobacco: Never  Vaping Use   Vaping status: Never Used  Substance and Sexual Activity   Alcohol use: No   Drug use: No   Sexual activity: Not Currently  Other Topics Concern   Not on file  Social History Narrative   Married in 1961.   6 grandchildren   Social Determinants of Health   Financial Resource Strain: Low Risk  (12/08/2022)   Overall Financial Resource Strain  (CARDIA)    Difficulty of Paying Living Expenses: Not hard at all  Food Insecurity: No Food Insecurity (12/08/2022)   Hunger Vital Sign    Worried About Running Out of Food in the Last Year: Never true    Ran Out of Food in the Last Year: Never true  Transportation Needs: No Transportation Needs (12/08/2022)   PRAPARE - Administrator, Civil Service (Medical): No    Lack of Transportation (Non-Medical): No  Physical Activity: Sufficiently Active (12/08/2022)   Exercise Vital Sign    Days of Exercise per Week: 5 days    Minutes of Exercise per Session: 30 min  Stress: No Stress Concern Present (12/08/2022)   [employer] of Occupational Health - Occupational Stress Questionnaire    Feeling of Stress : Not at all  Social Connections: Socially Integrated (12/08/2022)   Social Connection and Isolation Panel [NHANES]    Frequency of Communication with Friends and Family: More than three times a week    Frequency of Social Gatherings with Friends and Family: Three times a week    Attends Religious Services: More than 4 times per year    Active Member of Clubs or Organizations: Yes    Attends Banker Meetings: More than 4 times per year    Marital Status: Married  Catering manager Violence: Not At Risk (12/08/2022)   Humiliation, Afraid, Rape, and Kick questionnaire    Fear of Current or Ex-Partner: No    Emotionally Abused: No    Physically Abused: No    Sexually Abused: No      Review of Systems  All other systems reviewed and are negative.      Objective:   Physical Exam Vitals reviewed.  Constitutional:      General: He is not in acute distress.    Appearance: Normal appearance. He is not ill-appearing or toxic-appearing.  Cardiovascular:     Rate and Rhythm: Normal rate and regular rhythm.     Heart sounds: Normal heart sounds. No murmur heard.    No friction rub. No gallop.  Pulmonary:     Effort: Pulmonary effort is normal.  No respiratory  distress.     Breath sounds: Normal breath sounds and air entry. No stridor, decreased air movement or transmitted upper airway sounds. No wheezing, rhonchi or rales.  Abdominal:     General: Bowel sounds are normal.     Palpations: Abdomen is soft.  Musculoskeletal:     Right lower leg: No edema.     Left lower leg: No edema.  Neurological:     Mental Status: He is alert.          Assessment & Plan:  Chronic diarrhea  Weight loss At this point, I can find no explanation for the patient's symptoms.  Most likely explanation is irritable bowel syndrome with diarrhea versus exocrine pancreatic insufficiency.  I will check a fecal elastase.  Regardless, may consider empiric treatment with Creon 2 tablets with each meal to see if that helps with weight loss and diarrhea.  I have asked the patient to discontinue Jardiance.  The weight loss began long before the Jardiance but is certainly not helping.  At this point as long as his sugars are under 200 I would not treat them.

## 2023-04-27 DIAGNOSIS — R634 Abnormal weight loss: Secondary | ICD-10-CM | POA: Diagnosis not present

## 2023-04-27 DIAGNOSIS — K529 Noninfective gastroenteritis and colitis, unspecified: Secondary | ICD-10-CM | POA: Diagnosis not present

## 2023-05-05 LAB — PANCREATIC ELASTASE, FECAL: Pancreatic Elastase-1, Stool: 476 mcg/g

## 2023-05-11 ENCOUNTER — Encounter: Payer: Self-pay | Admitting: Family Medicine

## 2023-05-11 ENCOUNTER — Ambulatory Visit (INDEPENDENT_AMBULATORY_CARE_PROVIDER_SITE_OTHER): Payer: Medicare HMO | Admitting: Family Medicine

## 2023-05-11 VITALS — BP 116/72 | HR 68 | Temp 97.9°F | Ht 66.0 in | Wt 138.4 lb

## 2023-05-11 DIAGNOSIS — R634 Abnormal weight loss: Secondary | ICD-10-CM

## 2023-05-11 DIAGNOSIS — K529 Noninfective gastroenteritis and colitis, unspecified: Secondary | ICD-10-CM | POA: Diagnosis not present

## 2023-05-11 NOTE — Progress Notes (Signed)
Subjective:    Patient ID: Andrew [name], male    DOB: [birth year], 82 y.o.   MRN: [id number] 04/02/23 Patient is concerned because he continues to lose weight.  He continues to have loose stool.  However the frequency is much better.  Is now 1 or 2 a day.  They are loose and runny per his report.  He also has urgency at times.  His blood sugars are all less than 180 at night.  His fasting blood sugars are well-controlled.  He is on Jardiance for diabetes which also contribute to [facility] he had to stop form due to diarrhea.  We recently performed a CT scan of the abdomen and pelvis with results below.  I performed a prostate exam today which showed diffuse prostatic enlargement and hypertrophy but no nodularity or tenderness.  He is having some low back pain roughly around the level of L3-L4 however CT scan did show significant degenerative disc disease with anterior listhesis in the lower lumbar spine per my report.  FINDINGS: Lower chest: Moderate elevation of left hemidiaphragm. Dense mitral valve annular calcifications are present. The right atrium is dilated.   Hepatobiliary: No focal liver abnormality is seen. Status post cholecystectomy. No biliary dilatation.   Pancreas: Unremarkable. No pancreatic ductal dilatation or surrounding inflammatory changes.   Spleen: Visualized portions of the spleen are unremarkable.   Adrenals/Urinary Tract: Adrenal glands are normal.   Nonobstructing right renal calculus measuring 3 mm. Kidneys and ureters otherwise unremarkable. Focal calcifications noted in the anterior bladder wall at the site of the urachus measuring 3 and 2 mm. Bladder is otherwise normal.   Stomach/Bowel: No bowel dilatation to indicate ileus or obstruction. Appendix is normal. Mild sigmoid diverticulosis without evidence of acute diverticulitis.   Vascular/Lymphatic: No enlarged abdominal or pelvic lymph nodes. Calcified atheromatous plaque seen throughout the abdominal  aorta and visualized branches.   Reproductive: Markedly enlarged prostate again seen with focal low-density structure within the left side measuring 10 mm. This low-density lesion is unchanged since 07/11/2021 which favors a benign etiology.   Other: No abdominal wall hernia or abnormality. No abdominopelvic ascites.   Musculoskeletal: No acute or significant osseous findings.   IMPRESSION: 1. No acute abnormality of the abdomen or pelvis. 2. Mild sigmoid diverticulosis without evidence of acute diverticulitis. 3. Nonobstructing right renal calculus measuring 3 mm. 4. Markedly enlarged prostate.  At that time, my plan was:  Because of the loose stool I recommended that he add fiber.  The frequency is much better.  However hopefully the fiber will help with the consistency.  Because of the weight loss I want him to stop Jardiance.  As long as his blood sugars are under 200 I can except at this point in his life.  We will get his fasting lab work today.  I will check a PSA however his rectal exam shows BPH.  I suspect the weight loss is time primarily to the diarrhea and so I want to wait for results of the colonoscopy.  04/23/23 Colonoscopy and endoscopy were unremarkable.  Biopsies were unremarkable.  GI found no explanation for his weight loss or chronic diarrhea.  Therefore the most likely explanation for his chronic diarrhea would be irritable bowel syndrome with diarrhea although I have not worked up the patient for malabsorptive diarrhea due to pancreatic insufficiency.  All lab work has been unremarkable. Wt Readings from Last 3 Encounters:  05/11/23 138 lb 6.4 oz (62.8 kg)  04/23/23 137 lb 12.8  oz (62.5 kg)  04/16/23 138 lb 0.1 oz (62.6 kg)  At that time, my plan was: At this point, I can find no explanation for the patient's symptoms.  Most likely explanation is irritable bowel syndrome with diarrhea versus exocrine pancreatic insufficiency.  I will check a fecal elastase.   Regardless, may consider empiric treatment with Creon 2 tablets with each meal to see if that helps with weight loss and diarrhea.  I have asked the patient to discontinue Jardiance.  The weight loss began long before the Jardiance but is certainly not helping.  At this point as long as his sugars are under 200 I would not treat them.  05/11/23 Stool fecal elastase was negative.  Therefore I believe the patient does not have pancreatic insufficiency.  Patient is here today to discuss this further.  I explained to the patient that CT scans of the abdomen and pelvis have been negative, fecal cultures have been negative, fecal elastase has been negative, colonoscopy has been negative.  At this point, I have no explanation other than irritable bowel syndrome.  Past Medical History:  Diagnosis Date   Allergy    grass etc.   Barrett's esophagus    BPH (benign prostatic hyperplasia)    Chest pain, atypical    Chronic kidney disease    kidney stones   Coronary artery disease    Diabetes mellitus without complication (HCC)    GERD (gastroesophageal reflux disease)    Barrett's   Hernia    Hyperlipidemia    Hypertension    Myocardial infarction (HCC)    Thrombocytopenia (HCC)    Ulcer 1960   Past Surgical History:  Procedure Laterality Date   CARDIAC CATHETERIZATION  02/14/2007   MITRAL REGURGITATION. LV NORMAL   CHOLECYSTECTOMY     COLONOSCOPY     CORONARY ARTERY BYPASS GRAFT     twice  2 by passes each time   INGUINAL HERNIA REPAIR     bilateral done twice   LITHOTRIPSY     SKIN CANCER EXCISION     UPPER GASTROINTESTINAL ENDOSCOPY     Current Outpatient Medications on File Prior to Visit  Medication Sig Dispense Refill   Accu-Chek Softclix Lancets lancets TEST BLOOD SUGAR ONE TIME DAILY 100 each 0   Alcohol Swabs (DROPSAFE ALCOHOL PREP) 70 % PADS USE TOPICALLY ONE TIME DAILY 100 each 0   AMBULATORY NON FORMULARY MEDICATION Take 1 capsule by mouth in the morning, at noon, and at  bedtime. Heather's Tummy Tamers IBS, Peppermint oil with ginger and fennel     amLODipine (NORVASC) 5 MG tablet TAKE 1 TABLET EVERY DAY 90 tablet 0   aspirin 81 MG tablet Take 81 mg by mouth daily.     augmented betamethasone dipropionate (DIPROLENE-AF) 0.05 % cream Apply topically.     Blood Glucose Monitoring Suppl (ACCU-CHEK GUIDE ME) w/Device KIT Use as instructed to monitor FSBS 1x daily. Dx: E11.9 1 kit 1   cetirizine (ZYRTEC) 10 MG tablet Take 1 tablet (10 mg total) by mouth daily. 30 tablet 0   Cholecalciferol (VITAMIN D) 2000 units CAPS Take 1 capsule by mouth daily.     cholestyramine (QUESTRAN) 4 g packet Take 1 packet (4 g total) by mouth 2 (two) times daily. 60 each 2   clobetasol cream (TEMOVATE) 0.05 % Apply 1 Application topically 2 (two) times daily.     colesevelam (WELCHOL) 625 MG tablet Take 625 mg by mouth 2 (two) times daily with a meal.  empagliflozin (JARDIANCE) 25 MG TABS tablet Take 1 tablet (25 mg total) by mouth daily before breakfast. 30 tablet 11   finasteride (PROSCAR) 5 MG tablet TAKE 1 TABLET(5 MG) BY MOUTH DAILY. 90 tablet 3   glucose blood (ACCU-CHEK GUIDE) test strip TEST BLOOD SUGAR ONE TIME DAILY AS DIRECTED 100 strip 0   lansoprazole (PREVACID) 30 MG capsule TAKE 1 CAPSULE EVERY DAY 90 capsule 1   metoprolol succinate (TOPROL-XL) 25 MG 24 hr tablet TAKE 1/2 TABLET EVERY DAY 45 tablet 0   Multiple Vitamin (MULTIVITAMIN) tablet Take 1 tablet by mouth daily.     nitroGLYCERIN (NITROSTAT) 0.4 MG SL tablet Dissolve 1 tablet under the tongue every 5 minutes as needed for chest pain. Max of 3 doses, then 911. 75 tablet 3   olopatadine (PATADAY) 0.1 % ophthalmic solution Place 1 drop into both eyes 2 (two) times daily. 5 mL 12   Omega-3 Fatty Acids (FISH OIL) 1000 MG CPDR Take 1,000 mg by mouth every morning.     simvastatin (ZOCOR) 10 MG tablet TAKE 1 TABLET AT BEDTIME 90 tablet 3   tamsulosin (FLOMAX) 0.4 MG CAPS capsule TAKE 1 CAPSULE EVERY DAY 90 capsule 3    tiZANidine (ZANAFLEX) 2 MG tablet Take 1 tablet (2 mg total) by mouth every 6 (six) hours as needed for muscle spasms. 30 tablet 1   lipase/protease/amylase (CREON) 36000 UNITS CPEP capsule Take 2 capsules (72,000 Units total) by mouth 3 (three) times daily with meals. May also take 1 capsule (36,000 Units total) as needed (with snacks). (Patient not taking: Reported on 05/11/2023) 180 capsule 11   No current facility-administered medications on file prior to visit.   Allergies  Allergen Reactions   Imdur [Isosorbide Mononitrate]     Unknown, per pt    Meloxicam Swelling    Facial swelling    Social History   Socioeconomic History   Marital status: Married    Spouse name: Mary   Number of children: 2   Years of education: Not on file   Highest education level: Not on file  Occupational History   Occupation: Retired  Tobacco Use   Smoking status: Never   Smokeless tobacco: Never  Vaping Use   Vaping status: Never Used  Substance and Sexual Activity   Alcohol use: No   Drug use: No   Sexual activity: Not Currently  Other Topics Concern   Not on file  Social History Narrative   Married in 1961.   6 grandchildren   Social Determinants of Health   Financial Resource Strain: Low Risk  (12/08/2022)   Overall Financial Resource Strain (CARDIA)    Difficulty of Paying Living Expenses: Not hard at all  Food Insecurity: No Food Insecurity (12/08/2022)   Hunger Vital Sign    Worried About Running Out of Food in the Last Year: Never true    Ran Out of Food in the Last Year: Never true  Transportation Needs: No Transportation Needs (12/08/2022)   PRAPARE - Administrator, Civil Service (Medical): No    Lack of Transportation (Non-Medical): No  Physical Activity: Sufficiently Active (12/08/2022)   Exercise Vital Sign    Days of Exercise per Week: 5 days    Minutes of Exercise per Session: 30 min  Stress: No Stress Concern Present (12/08/2022)   [employer] of  Occupational Health - Occupational Stress Questionnaire    Feeling of Stress : Not at all  Social Connections: Socially Integrated (12/08/2022)   Social  Connection and Isolation Panel [NHANES]    Frequency of Communication with Friends and Family: More than three times a week    Frequency of Social Gatherings with Friends and Family: Three times a week    Attends Religious Services: More than 4 times per year    Active Member of Clubs or Organizations: Yes    Attends Banker Meetings: More than 4 times per year    Marital Status: Married  Catering manager Violence: Not At Risk (12/08/2022)   Humiliation, Afraid, Rape, and Kick questionnaire    Fear of Current or Ex-Partner: No    Emotionally Abused: No    Physically Abused: No    Sexually Abused: No      Review of Systems  All other systems reviewed and are negative.      Objective:   Physical Exam Vitals reviewed.  Constitutional:      General: He is not in acute distress.    Appearance: Normal appearance. He is not ill-appearing or toxic-appearing.  Cardiovascular:     Rate and Rhythm: Normal rate and regular rhythm.     Heart sounds: Normal heart sounds. No murmur heard.    No friction rub. No gallop.  Pulmonary:     Effort: Pulmonary effort is normal. No respiratory distress.     Breath sounds: Normal breath sounds and air entry. No stridor, decreased air movement or transmitted upper airway sounds. No wheezing, rhonchi or rales.  Abdominal:     General: Bowel sounds are normal.     Palpations: Abdomen is soft.  Musculoskeletal:     Right lower leg: No edema.     Left lower leg: No edema.  Neurological:     Mental Status: He is alert.          Assessment & Plan:   Chronic diarrhea  Weight loss An extensive workup to date suggest irritable bowel syndrome with diarrhea.  I recommended that he continue the cholestyramine with Metamucil.  Unfortunately I have no other options for him.  Recommended  repeating his lab work in December to monitor his A1c admitted staying off [place] and liberalizing his diet to achieve more weight gain.  As long as he can keep his sugars under 180 I am satisfied with that value.

## 2023-05-30 ENCOUNTER — Other Ambulatory Visit: Payer: Self-pay | Admitting: Family Medicine

## 2023-06-01 NOTE — Telephone Encounter (Signed)
OV 04/02/23 with recent problem visit 7/12,30 Rx 03/18/23 #90- too soon Requested Prescriptions  Pending Prescriptions Disp Refills   amLODipine (NORVASC) 5 MG tablet [Pharmacy Med Name: AMLODIPINE BESYLATE 5 MG Tablet] 90 tablet 3    Sig: TAKE 1 TABLET EVERY DAY     Cardiovascular: Calcium Channel Blockers 2 Failed - 05/30/2023  3:11 AM      Failed - Valid encounter within last 6 months    Recent Outpatient Visits           1 year ago LLQ abdominal pain   [facility] Pickard, Priscille Heidelberg, MD   1 year ago Encounter for Medicare annual wellness exam   [facility] Pickard, Priscille Heidelberg, MD   1 year ago Plantar fasciitis   [facility] Donita Brooks, MD   1 year ago Controlled type 2 diabetes mellitus with complication, without long-term current use of insulin (HCC)   [facility] Donita Brooks, MD   2 years ago Controlled type 2 diabetes mellitus with complication, without long-term current use of insulin (HCC)   [facility] Pickard, Priscille Heidelberg, MD       Future Appointments             In 1 month Nahser, Deloris Ping, MD [facility]            Passed - Last BP in normal range    BP Readings from Last 1 Encounters:  05/11/23 116/72         Passed - Last Heart Rate in normal range    Pulse Readings from Last 1 Encounters:  05/11/23 68

## 2023-06-02 ENCOUNTER — Other Ambulatory Visit: Payer: Self-pay | Admitting: Family Medicine

## 2023-06-03 ENCOUNTER — Telehealth: Payer: Self-pay | Admitting: Cardiovascular Disease

## 2023-06-03 NOTE — Telephone Encounter (Signed)
Pt called in asking if he can have lab work drawn prior to his appt 07/02/23.

## 2023-06-03 NOTE — Telephone Encounter (Signed)
Requested Prescriptions  Pending Prescriptions Disp Refills   simvastatin (ZOCOR) 10 MG tablet [Pharmacy Med Name: SIMVASTATIN 10 MG Tablet] 90 tablet 0    Sig: TAKE 1 TABLET AT BEDTIME     Cardiovascular:  Antilipid - Statins Failed - 06/02/2023 10:35 AM      Failed - Valid encounter within last 12 months    Recent Outpatient Visits           1 year ago LLQ abdominal pain   [facility] Tanya Nones, Priscille Heidelberg, MD   1 year ago Encounter for Medicare annual wellness exam   [facility] Pickard, Priscille Heidelberg, MD   1 year ago Plantar fasciitis   [facility] Donita Brooks, MD   1 year ago Controlled type 2 diabetes mellitus with complication, without long-term current use of insulin (HCC)   [facility] Donita Brooks, MD   2 years ago Controlled type 2 diabetes mellitus with complication, without long-term current use of insulin (HCC)   [facility] Pickard, Priscille Heidelberg, MD       Future Appointments             In 4 weeks Nahser, Deloris Ping, MD [facility], [address]            Failed - Lipid Panel in normal range within the last 12 months    Cholesterol, Total  Date Value Ref Range Status  06/02/2022 99 (L) 100 - 199 mg/dL Final   Cholesterol  Date Value Ref Range Status  04/02/2023 104 <200 mg/dL Final   LDL Cholesterol (Calc)  Date Value Ref Range Status  04/02/2023 37 mg/dL (calc) Final    Comment:    Reference range: <100 . Desirable range <100 mg/dL for primary prevention;   <70 mg/dL for patients with CHD or diabetic patients  with > or = 2 CHD risk factors. [name] LDL-C is now calculated using the Martin-Hopkins  calculation, which is a validated novel method providing  better accuracy than the Friedewald equation in the  estimation of LDL-C.  Horald Pollen et al. Lenox Ahr. 9147;829(56): 2061-2068  (http://education.[employer].com/faq/FAQ164)    HDL  Date  Value Ref Range Status  04/02/2023 50 > OR = 40 mg/dL Final  21/30/8657 40 >84 mg/dL Final   Triglycerides  Date Value Ref Range Status  04/02/2023 88 <150 mg/dL Final         Passed - Patient is not pregnant

## 2023-06-04 NOTE — Telephone Encounter (Signed)
Pt called in yesterday just to make sure Dr. Elease Hashimoto didn't want him to have any labs done prior to seeing him for his routine visit on 07/02/23.  Informed the pt that from what I see, he doesn't need any labs prior to that visit, being Dr. Tanya Nones checked all his maintenance labs, including lipids, at last OV with him back 04/02/23.  Told the pt that Dr. Tanya Nones noted with those labs that everything looked excellent, all but his platelets were low and he referred him to Hematology for further work-up for that.   Pt went in with Hematology on 7/5 where they treated him and rechecked his CBC W DIFF, and platelets returned to normal at that time.   Advised the pt that it doesn't seem to me that he will need any additional labs prior to his 9/20 OV with Dr. Elease Hashimoto, just show up for the appt as scheduled.   He is aware that I will cc Dr. Elease Hashimoto in on this for further input if needed, but all his labs are on task for his upcoming visit.   Pt verbalized understanding and agrees with this plan.

## 2023-06-13 ENCOUNTER — Other Ambulatory Visit: Payer: Self-pay | Admitting: Family Medicine

## 2023-06-15 NOTE — Telephone Encounter (Signed)
Requested Prescriptions  Pending Prescriptions Disp Refills   metoprolol succinate (TOPROL-XL) 25 MG 24 hr tablet [Pharmacy Med Name: METOPROLOL SUCCINATE ER 25 MG Tablet Extended Release 24 Hour] 45 tablet 3    Sig: TAKE 1/2 TABLET EVERY DAY     Cardiovascular:  Beta Blockers Failed - 06/13/2023  2:50 AM      Failed - Valid encounter within last 6 months    Recent Outpatient Visits           1 year ago LLQ abdominal pain   [facility] Pickard, Priscille Heidelberg, MD   1 year ago Encounter for Medicare annual wellness exam   [facility] Pickard, Priscille Heidelberg, MD   1 year ago Plantar fasciitis   [facility] Donita Brooks, MD   2 years ago Controlled type 2 diabetes mellitus with complication, without long-term current use of insulin (HCC)   [facility] Medicine Donita Brooks, MD   2 years ago Controlled type 2 diabetes mellitus with complication, without long-term current use of insulin (HCC)   [facility] Pickard, Priscille Heidelberg, MD       Future Appointments             In 2 weeks Nahser, Deloris Ping, MD [facility] at [facility], [facility]            Passed - Last BP in normal range    BP Readings from Last 1 Encounters:  05/11/23 116/72         Passed - Last Heart Rate in normal range    Pulse Readings from Last 1 Encounters:  05/11/23 68

## 2023-06-20 ENCOUNTER — Other Ambulatory Visit: Payer: Self-pay | Admitting: Family Medicine

## 2023-06-22 ENCOUNTER — Ambulatory Visit: Payer: Medicare HMO | Admitting: Gastroenterology

## 2023-06-22 NOTE — Telephone Encounter (Signed)
Due to a system glitch the last office visit for this practice is not detected.   His LOV was in July 2024.  Requested Prescriptions  Pending Prescriptions Disp Refills   lansoprazole (PREVACID) 30 MG capsule [Pharmacy Med Name: Lansoprazole Oral Capsule Delayed Release 30 MG] 90 capsule 1    Sig: TAKE 1 CAPSULE EVERY DAY     Gastroenterology: Proton Pump Inhibitors 2 Failed - 06/20/2023  3:21 AM      Failed - Valid encounter within last 12 months    Recent Outpatient Visits           1 year ago LLQ abdominal pain   [facility] Pickard, Priscille Heidelberg, MD   1 year ago Encounter for Medicare annual wellness exam   [facility] Pickard, Priscille Heidelberg, MD   1 year ago Plantar fasciitis   [facility] Donita Brooks, MD   2 years ago Controlled type 2 diabetes mellitus with complication, without long-term current use of insulin (HCC)   [facility] Donita Brooks, MD   2 years ago Controlled type 2 diabetes mellitus with complication, without long-term current use of insulin (HCC)   [facility] Pickard, Priscille Heidelberg, MD       Future Appointments             In 1 week Nahser, Deloris Ping, MD [facility] at [facility], [facility]            Passed - ALT in normal range and within 360 days    ALT  Date Value Ref Range Status  04/16/2023 15 0 - 44 U/L Final         Passed - AST in normal range and within 360 days    AST  Date Value Ref Range Status  04/16/2023 16 15 - 41 U/L Final

## 2023-06-22 NOTE — Progress Notes (Deleted)
HPI : seen in May be Andrew [name]  82 year old male with a past medical history as listed below, known to Dr. Adela Lank, who was referred to me by Donita Brooks, MD for a complaint of diarrhea and weight loss.      04/27/2011 colonoscopy with Dr. Jarold Motto with severe diverticulosis in the sigmoid/descending colon, sessile polyp in the sigmoid colon and otherwise normal.  Repeat recommended in 10 years.  Recall patient recently had GI pathogen panel and ova and parasite exam ordered which was negative/normal.  Previously 07/15/2018 CBC, CMP and TSH were.      08/03/2018 patient seen in clinic and described that for 3 to 4 weeks he started with some loose stool out of the blue had at least 2-3 loose/watery stools per day and sometimes up to 6/day.  Stopping Losartan did not help.  Usually would use Imodium which would stop him up for 2 to 3 days but then it would come right back.  At that time recommend increasing fiber as well as starting Align.  Ordered C. difficile testing.  All stool testing was negative.    09/07/2018 patient followed in clinic and at that time the gushing of his stools I decreased with Align and fiber daily.  He was scheduled for colon in the [facility].    09/14/2018 colonoscopy with a large amount of liquid stool in the entire colon, 2 sessile polyps in the ascending colon, 2 sessile polyps in the ascending colon diminutive and the others were 3 to 4 mm in size, 3 mm polyp in the transverse colon diminutive polyp in the sigmoid and a single angiodysplastic lesion in the ascending colon multiple medium mouth diverticula.  Also internal hemorrhoids.  Pathology showed tubular adenomas.  Discussed possible repeat in 3 years given his age recommended follow-up first.  No cause for bowel changes.  Recommend low-dose Imodium as needed or trying Colestid 1 g twice daily.    Today, patient presents to clinic and tells me that for the past many years he has been okay as far as his diarrhea is  concerned, though he does know he has always had somewhat of an issue ever since he started him on Metformin which he takes 500 mg twice daily.  Tells me over the past 4 to 5 months he has had an increase in stools to 3-5 times a day which are liquid urgent and uncontrollable.  He was told to try over-the-counter Imodium which he used 3 times a day for about a week but did not feel like it really helped.  Along with this has noticed a weight loss of around 30 pounds over the past 2 years without really trying.  Recently his daughter started him on over-the-counter Peppermint oil and has been taking this for the past 3 days and feels like it is "maybe helped a little".  No abdominal pain with this.  Does not recall ever being on cholestyramine in the past.    Tells me that he supposed to have some teeth taken out but they are not bothering him at all.  He just went to see his cardiologist for clearance for anesthesia for this.  They cleared him.  He wonders if he needs to wait on his dental extraction.    Denies fever, chills, nausea, vomiting, abdominal pain or blood in his stool.    Assessment: 1.  Diarrhea: Patient was seen back in 2019 for this with negative workup and started on Imodium/cholestyramine, we have not seen  him over the past 5 years, he tells me he did well for some years but then over the past 4 to 5 months his diarrhea came back; consider infectious versus inflammatory versus side effect from metformin 2.  Weight loss: 30 pounds in the past years without trying, CT has been ordered by PCP   Plan: 1.  Recommend patient restart Cholestyramine twice daily for now 2.  Went ahead and scheduled patient for a diagnostic EGD and colonoscopy given the weight loss and change in bowel habits with Dr. Adela Lank in the [facility].  Patient was pushing for these procedures today.  He wants to know what is going on.  This is scheduled for the end of June to allow [place] time to see if the Cholestyramine works  and also to allow for CT to be done to make sure there is no other cause seen for this. 3.  Repeat stool studies including GI path panel and fecal pancreatic elastase 4.  We will ask for cardiac clearance given patient's age 44.  Discussed dental extraction, if the patient is not having any pain and there is no risk of waiting on this then would recommend that he hold for now 6.  Patient to follow in clinic with [place] per recommendations after stool studies and CT/procedures.     CT abdomen / pelvis 03/21/23: IMPRESSION: 1. No acute abnormality of the abdomen or pelvis. 2. Mild sigmoid diverticulosis without evidence of acute diverticulitis. 3. Nonobstructing right renal calculus measuring 3 mm. 4. Markedly enlarged prostate.   Aortic Atherosclerosis (ICD10-I70.0).   EGD 04/06/23: - Esophagogastric landmarks were identified: the Z-line was found at 40 cm, the gastroesophageal junction was found at 40 cm and the upper extent of the gastric folds was found at 41 cm from the incisors. Findings: - A 1 cm hiatal hernia was present. 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 - The exam of the esophagus was otherwise normal. - Patchy mildly erythematous mucosa was found in the gastric body. - The exam of the stomach was otherwise normal. - Biopsies were taken with a cold forceps for Helicobacter pylori testing. - The examined duodenum was normal. Biopsies for histology were taken with a cold forceps for evaluation of celiac disease.   Colonoscopy 04/06/23: - The perianal and digital rectal examinations were normal. - The terminal ileum appeared normal. - A 4 mm polyp was found in the hepatic flexure. The polyp was sessile. The polyp was removed with a cold snare. Resection and retrieval were complete. - A 4 mm polyp was found in the descending colon. The polyp was sessile. The polyp was removed with a cold snare. Resection and retrieval were complete. - A few  medium-mouthed diverticula were found in the sigmoid colon. - Hemorrhoids were found during retroflexion. - The exam was otherwise without abnormality. - Biopsies for histology were taken with a cold forceps from the right colon, left colon and transverse colon for evaluation of microscopic colitis.   Diagnosis 1. Surgical [P], duodenal bx - BENIGN SMALL BOWEL MUCOSA WITH NO SIGNIFICANT PATHOLOGIC CHANGES 2. Surgical [P], gastric antrum and gastric body bx - GASTRIC ANTRAL AND OXYNTIC MUCOSA WITH REACTIVE CHANGES - NEGATIVE FOR H. PYLORI ON H&E STAIN - NEGATIVE FOR INTESTINAL METAPLASIA OR MALIGNANCY 3. Surgical [P], random colon bx sites - BENIGN COLONIC MUCOSA WITH NO SPECIFIC PATHOLOGIC CHANGES - NEGATIVE FOR INCREASED INTRAEPITHELIAL LYMPHOCYTES OR THICKENED SUBEPITHELIAL COLLAGEN TABLE - NEGATIVE FOR  DYSPLASIA OR MALIGNANCY 4. Surgical [P], colon, hepatic flexure polyp x 1, descending polyp x 1, polyp (2) - TUBULAR ADENOMA(S). - NO HIGH GRADE DYSPLASIA OR MALIGNANCY.       Past Medical History:  Diagnosis Date   Allergy    grass etc.   Barrett's esophagus    BPH (benign prostatic hyperplasia)    Chest pain, atypical    Chronic kidney disease    kidney stones   Coronary artery disease    Diabetes mellitus without complication (HCC)    GERD (gastroesophageal reflux disease)    Barrett's   Hernia    Hyperlipidemia    Hypertension    Myocardial infarction (HCC)    Thrombocytopenia (HCC)    Ulcer 1960     Past Surgical History:  Procedure Laterality Date   CARDIAC CATHETERIZATION  02/14/2007   MITRAL REGURGITATION. LV NORMAL   CHOLECYSTECTOMY     COLONOSCOPY     CORONARY ARTERY BYPASS GRAFT     twice  2 by passes each time   INGUINAL HERNIA REPAIR     bilateral done twice   LITHOTRIPSY     SKIN CANCER EXCISION     UPPER GASTROINTESTINAL ENDOSCOPY     Family History  Problem Relation Age of Onset   Stroke Father    Heart attack Mother    Heart attack  Maternal Grandmother    Heart attack Maternal Grandfather    Heart disease Sister    Heart disease Brother    Heart disease Sister    Heart disease Brother    Prostate cancer Brother    Colon cancer Neg Hx    Esophageal cancer Neg Hx    Rectal cancer Neg Hx    Stomach cancer Neg Hx    Social History   Tobacco Use   Smoking status: Never   Smokeless tobacco: Never  Vaping Use   Vaping status: Never Used  Substance Use Topics   Alcohol use: No   Drug use: No   Current Outpatient Medications  Medication Sig Dispense Refill   Accu-Chek Softclix Lancets lancets TEST BLOOD SUGAR ONE TIME DAILY 100 each 0   Alcohol Swabs (DROPSAFE ALCOHOL PREP) 70 % PADS USE TOPICALLY ONE TIME DAILY 100 each 0   AMBULATORY NON FORMULARY MEDICATION Take 1 capsule by mouth in the morning, at noon, and at bedtime. Heather's Tummy Tamers IBS, Peppermint oil with ginger and fennel     amLODipine (NORVASC) 5 MG tablet TAKE 1 TABLET EVERY DAY 90 tablet 0   aspirin 81 MG tablet Take 81 mg by mouth daily.     augmented betamethasone dipropionate (DIPROLENE-AF) 0.05 % cream Apply topically.     Blood Glucose Monitoring Suppl (ACCU-CHEK GUIDE ME) w/Device KIT Use as instructed to monitor FSBS 1x daily. Dx: E11.9 1 kit 1   cetirizine (ZYRTEC) 10 MG tablet Take 1 tablet (10 mg total) by mouth daily. 30 tablet 0   Cholecalciferol (VITAMIN D) 2000 units CAPS Take 1 capsule by mouth daily.     cholestyramine (QUESTRAN) 4 g packet Take 1 packet (4 g total) by mouth 2 (two) times daily. 60 each 2   clobetasol cream (TEMOVATE) 0.05 % Apply 1 Application topically 2 (two) times daily.     colesevelam (WELCHOL) 625 MG tablet Take 625 mg by mouth 2 (two) times daily with a meal.     empagliflozin (JARDIANCE) 25 MG TABS tablet Take 1 tablet (25 mg total) by mouth daily before breakfast. 30 tablet 11  finasteride (PROSCAR) 5 MG tablet TAKE 1 TABLET(5 MG) BY MOUTH DAILY. 90 tablet 3   glucose blood (ACCU-CHEK GUIDE) test  strip TEST BLOOD SUGAR ONE TIME DAILY AS DIRECTED 100 strip 0   lansoprazole (PREVACID) 30 MG capsule TAKE 1 CAPSULE EVERY DAY 90 capsule 1   lipase/protease/amylase (CREON) 36000 UNITS CPEP capsule Take 2 capsules (72,000 Units total) by mouth 3 (three) times daily with meals. May also take 1 capsule (36,000 Units total) as needed (with snacks). (Patient not taking: Reported on 05/11/2023) 180 capsule 11   metoprolol succinate (TOPROL-XL) 25 MG 24 hr tablet TAKE 1/2 TABLET EVERY DAY 45 tablet 3   Multiple Vitamin (MULTIVITAMIN) tablet Take 1 tablet by mouth daily.     nitroGLYCERIN (NITROSTAT) 0.4 MG SL tablet Dissolve 1 tablet under the tongue every 5 minutes as needed for chest pain. Max of 3 doses, then 911. 75 tablet 3   olopatadine (PATADAY) 0.1 % ophthalmic solution Place 1 drop into both eyes 2 (two) times daily. 5 mL 12   Omega-3 Fatty Acids (FISH OIL) 1000 MG CPDR Take 1,000 mg by mouth every morning.     simvastatin (ZOCOR) 10 MG tablet TAKE 1 TABLET AT BEDTIME 90 tablet 0   tamsulosin (FLOMAX) 0.4 MG CAPS capsule TAKE 1 CAPSULE EVERY DAY 90 capsule 3   tiZANidine (ZANAFLEX) 2 MG tablet Take 1 tablet (2 mg total) by mouth every 6 (six) hours as needed for muscle spasms. 30 tablet 1   No current facility-administered medications for this visit.   Allergies  Allergen Reactions   Imdur [Isosorbide Mononitrate]     Unknown, per pt    Meloxicam Swelling    Facial swelling      Review of Systems: All systems reviewed and negative except where noted in HPI.    No results found.  Physical Exam: There were no vitals taken for this visit. Constitutional: Pleasant,well-developed, ***male in no acute distress. HEENT: Normocephalic and atraumatic. Conjunctivae are normal. No scleral icterus. Neck supple.  Cardiovascular: Normal rate, regular rhythm.  Pulmonary/chest: Effort normal and breath sounds normal. No wheezing, rales or rhonchi. Abdominal: Soft, nondistended, nontender.  Bowel sounds active throughout. There are no masses palpable. No hepatomegaly. Extremities: no edema Lymphadenopathy: No cervical adenopathy noted. Neurological: Alert and oriented to person place and time. Skin: Skin is warm and dry. No rashes noted. Psychiatric: Normal mood and affect. Behavior is normal.   ASSESSMENT: 82 y.o. male here for assessment of the following  No diagnosis found.  PLAN:   Donita Brooks, MD

## 2023-06-26 ENCOUNTER — Other Ambulatory Visit: Payer: Self-pay | Admitting: Family Medicine

## 2023-06-28 NOTE — Telephone Encounter (Signed)
Request is too soon for refill, duplicate request.  Requested Prescriptions  Pending Prescriptions Disp Refills   JARDIANCE 25 MG TABS tablet [Pharmacy Med Name: JARDIANCE 25MG  TABLETS] 30 tablet 11    Sig: TAKE 1 TABLET(25 MG) BY MOUTH DAILY BEFORE BREAKFAST     Endocrinology:  Diabetes - SGLT2 Inhibitors Failed - 06/26/2023  2:39 PM      Failed - Valid encounter within last 6 months    Recent Outpatient Visits           1 year ago LLQ abdominal pain   [facility] Donita Brooks, MD   1 year ago Encounter for Medicare annual wellness exam   [facility] Donita Brooks, MD   1 year ago Plantar fasciitis   [facility] Donita Brooks, MD   2 years ago Controlled type 2 diabetes mellitus with complication, without long-term current use of insulin (HCC)   [facility] Pickard, Priscille Heidelberg, MD   2 years ago Controlled type 2 diabetes mellitus with complication, without long-term current use of insulin (HCC)   [facility] Pickard, Priscille Heidelberg, MD       Future Appointments             In 4 days Nahser, Deloris Ping, MD [place] [facility], [facility]            Passed - Cr in normal range and within 360 days    Creat  Date Value Ref Range Status  04/02/2023 0.86 0.70 - 1.22 mg/dL Final   Creatinine, Ser  Date Value Ref Range Status  04/16/2023 0.78 0.61 - 1.24 mg/dL Final         Passed - HBA1C is between 0 and 7.9 and within 180 days    Hgb A1c MFr Bld  Date Value Ref Range Status  04/02/2023 6.5 (H) <5.7 % of total Hgb Final    Comment:    For someone without known diabetes, a hemoglobin A1c value of 6.5% or greater indicates that they may have  diabetes and this should be confirmed with a follow-up  test. . For someone with known diabetes, a value <7% indicates  that their diabetes is well controlled and a value  greater than or equal to 7% indicates suboptimal   control. A1c targets should be individualized based on  duration of diabetes, age, comorbid conditions, and  other considerations. . Currently, no consensus exists regarding use of hemoglobin A1c for diagnosis of diabetes for children. .          Passed - eGFR in normal range and within 360 days    GFR, Est African American  Date Value Ref Range Status  04/09/2021 95 > OR = 60 mL/min/1.90m2 Final   GFR, Est Non African American  Date Value Ref Range Status  04/09/2021 82 > OR = 60 mL/min/1.71m2 Final   GFR, Estimated  Date Value Ref Range Status  04/16/2023 >60 >60 mL/min Final    Comment:    (NOTE) Calculated using the CKD-EPI Creatinine Equation (2021)    GFR  Date Value Ref Range Status  07/08/2015 91.76 >60.00 mL/min Final   eGFR  Date Value Ref Range Status  04/02/2023 85 > OR = 60 mL/min/1.31m2 Final  06/02/2022 88 >59 mL/min/1.73 Final

## 2023-06-28 NOTE — Telephone Encounter (Signed)
Requested Prescriptions  Refused Prescriptions Disp Refills   JARDIANCE 25 MG TABS tablet [Pharmacy Med Name: JARDIANCE 25MG  TABLETS] 30 tablet 11    Sig: TAKE 1 TABLET(25 MG) BY MOUTH DAILY BEFORE BREAKFAST     Endocrinology:  Diabetes - SGLT2 Inhibitors Failed - 06/26/2023  2:37 PM      Failed - Valid encounter within last 6 months    Recent Outpatient Visits           1 year ago LLQ abdominal pain   [facility] Donita Brooks, MD   1 year ago Encounter for Medicare annual wellness exam   [facility] Donita Brooks, MD   1 year ago Plantar fasciitis   [facility] Donita Brooks, MD   2 years ago Controlled type 2 diabetes mellitus with complication, without long-term current use of insulin (HCC)   [facility] Pickard, Priscille Heidelberg, MD   2 years ago Controlled type 2 diabetes mellitus with complication, without long-term current use of insulin (HCC)   [facility] Pickard, Priscille Heidelberg, MD       Future Appointments             In 4 days Nahser, Deloris Ping, MD [place]            Passed - Cr in normal range and within 360 days    Creat  Date Value Ref Range Status  04/02/2023 0.86 0.70 - 1.22 mg/dL Final   Creatinine, Ser  Date Value Ref Range Status  04/16/2023 0.78 0.61 - 1.24 mg/dL Final         Passed - HBA1C is between 0 and 7.9 and within 180 days    Hgb A1c MFr Bld  Date Value Ref Range Status  04/02/2023 6.5 (H) <5.7 % of total Hgb Final    Comment:    For someone without known diabetes, a hemoglobin A1c value of 6.5% or greater indicates that they may have  diabetes and this should be confirmed with a follow-up  test. . For someone with known diabetes, a value <7% indicates  that their diabetes is well controlled and a value  greater than or equal to 7% indicates suboptimal  control. A1c targets should be individualized based on   duration of diabetes, age, comorbid conditions, and  other considerations. . Currently, no consensus exists regarding use of hemoglobin A1c for diagnosis of diabetes for children. .          Passed - eGFR in normal range and within 360 days    GFR, Est African American  Date Value Ref Range Status  04/09/2021 95 > OR = 60 mL/min/1.54m2 Final   GFR, Est Non African American  Date Value Ref Range Status  04/09/2021 82 > OR = 60 mL/min/1.70m2 Final   GFR, Estimated  Date Value Ref Range Status  04/16/2023 >60 >60 mL/min Final    Comment:    (NOTE) Calculated using the CKD-EPI Creatinine Equation (2021)    GFR  Date Value Ref Range Status  07/08/2015 91.76 >60.00 mL/min Final   eGFR  Date Value Ref Range Status  04/02/2023 85 > OR = 60 mL/min/1.64m2 Final  06/02/2022 88 >59 mL/min/1.73 Final

## 2023-07-01 ENCOUNTER — Encounter: Payer: Self-pay | Admitting: Cardiovascular Disease

## 2023-07-01 NOTE — Progress Notes (Signed)
Andrew [name] Date of Birth  03-22-1941 [facility] [address]    [address] [place], [place]  [address] [phone]  Fax  [phone]   problem list 1. Coronary artery disease 2. Hyperlipidemia 3.  Previous notes.   Andrew [name] is a 82 year old gentleman with a history of coronary artery disease. He status post coronary artery bypass grafting. He also has a history of dyslipidemia.  He's recently been having some problems with back pain.  These episodes of back pain would typically occur when he is doing his normal household chores. It would typically occur in the middle of his back and radiate up through to the front of his chest and between the shoulder blades.  This was not similar to his previous episodes of angina prior to his bypass grafting.  He's been exercising on a regular basis and has not had these symptoms with exercise.   Sept. 11. 2014:  Andrew [name] is doing ok.  No angina.  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  No   Sept. 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  Raised a garden this years.  No CP. Works out regularly.    Oct. 4, 2016: Doing great.    i reviewed his labs with him.   Everything is stable .  Still raising cattle.  Beef prices are down quite a bit this year.    Oct. 24, 2017:   Doing well Still raising cows.    No CP .    September 29, 2017:  Andrew [name] is seen today.  He has a history of coronary artery disease and hyperlipidemia. No CP  Has some shortness of breath. Has had some lower back pain and abd.  Still raising cows .   Hurt his right arm while using his  tractor   His last heart catheterization was Feb 14, 2007. The left anterior descending artery is occluded.  The saphenous vein graft to the second diagonal artery is a large graft and fills the left anterior descending artery in a retrograde fashion.  There is a 90% stenosis in the mid/distal LAD. The LIMA is atretic and does not supply any significant flow to the  LAD. Left circumflex artery has minor luminal irregularities.  The left circumflex artery is dominant.   May 11, 2018:  Doing well.   Treadmill for about 15 to 20 minutes each day.  No episodes of chest pain.  Has occasional swelling in rignht ankle but this is related to SVG harvest  Recent lipid panel from May, 2019 all look good.  Total cholesterol is 100.  HDL is 36.  The LDL is 50.  The triglyceride [name] is 62. Does not take NTG   Aug. 25, 2020 :  Andrew [name] is seen today for follow up visit Hx of CABG in 1998 and then again in 2008 Doing well.  No cp or dyspnea . Still puts in a garden.   Raises cows.  Limited by a right shoulder injury .   June 04, 2020: Andrew [name] is seen today for a follow-up visit.  He has a history of coronary artery disease and coronary artery bypass grafting x2. No cp,  No dsypnea.   Aug. 19, 2022: Andrew [name] is seen today for follow up of his CAD and CABG. No CP ,   walks on treadmill on occasion   Lipids from Aug. 16, show elevated glucose  Lipids look good   Aug. 22, 2023  Andrew [name] is seen today  for follow up of his CAD / CABG Having some back issues  Walks on the treadmill on occasion .  He has been encouraged to have a colonoscopy.  He has been very stable .   He is at low risk for colonoscipy . May hold ASA for 5-7 days prior to procedure   Sept. 20, 2024 Andrew [name] is seen for follow up of his CAD, CABG Seen with daughter, Elita Quick  Has had some chest pain on occasion   Has been going on for a while  Has some intrascapular pain ( also has other back issues  Does his yard work  Has not been on the treadmill    [facility] from [facility].   Has a tooth abscess  Needs several teeth pulled    He had lipids drawn in June, 2024. Total cholesterol is 104 HDL is 50 LDL is 37 Triglyceride [name] is 88    Current Outpatient Medications on File Prior to Visit  Medication Sig Dispense Refill   Accu-Chek Softclix Lancets lancets TEST BLOOD SUGAR ONE TIME  DAILY 100 each 0   Alcohol Swabs (DROPSAFE ALCOHOL PREP) 70 % PADS USE TOPICALLY ONE TIME DAILY 100 each 0   AMBULATORY NON FORMULARY MEDICATION Take 1 capsule by mouth in the morning, at noon, and at bedtime. Heather's Tummy Tamers IBS, Peppermint oil with ginger and fennel     amLODipine (NORVASC) 5 MG tablet TAKE 1 TABLET EVERY DAY 90 tablet 0   aspirin 81 MG tablet Take 81 mg by mouth daily.     augmented betamethasone dipropionate (DIPROLENE-AF) 0.05 % cream Apply topically.     Blood Glucose Monitoring Suppl (ACCU-CHEK GUIDE ME) w/Device KIT Use as instructed to monitor FSBS 1x daily. Dx: E11.9 1 kit 1   cholestyramine (QUESTRAN) 4 g packet Take 1 packet (4 g total) by mouth 2 (two) times daily. 60 each 2   colesevelam (WELCHOL) 625 MG tablet Take 625 mg by mouth 2 (two) times daily with a meal.     finasteride (PROSCAR) 5 MG tablet TAKE 1 TABLET(5 MG) BY MOUTH DAILY. 90 tablet 3   glucose blood (ACCU-CHEK GUIDE) test strip TEST BLOOD SUGAR ONE TIME DAILY AS DIRECTED 100 strip 0   lansoprazole (PREVACID) 30 MG capsule TAKE 1 CAPSULE EVERY DAY 90 capsule 1   lipase/protease/amylase (CREON) 36000 UNITS CPEP capsule Take 2 capsules (72,000 Units total) by mouth 3 (three) times daily with meals. May also take 1 capsule (36,000 Units total) as needed (with snacks). 180 capsule 11   metoprolol succinate (TOPROL-XL) 25 MG 24 hr tablet TAKE 1/2 TABLET EVERY DAY 45 tablet 3   Multiple Vitamin (MULTIVITAMIN) tablet Take 1 tablet by mouth daily.     nitroGLYCERIN (NITROSTAT) 0.4 MG SL tablet Dissolve 1 tablet under the tongue every 5 minutes as needed for chest pain. Max of 3 doses, then 911. 75 tablet 3   Omega-3 Fatty Acids (FISH OIL) 1000 MG CPDR Take 1,000 mg by mouth every morning.     simvastatin (ZOCOR) 10 MG tablet TAKE 1 TABLET AT BEDTIME 90 tablet 0   tamsulosin (FLOMAX) 0.4 MG CAPS capsule TAKE 1 CAPSULE EVERY DAY 90 capsule 3   cetirizine (ZYRTEC) 10 MG tablet Take 1 tablet (10 mg total)  by mouth daily. (Patient not taking: Reported on 07/02/2023) 30 tablet 0   Cholecalciferol (VITAMIN D) 2000 units CAPS Take 1 capsule by mouth daily. (Patient not taking: Reported on 07/02/2023)     clobetasol cream (TEMOVATE) 0.05 %  Apply 1 Application topically 2 (two) times daily. (Patient not taking: Reported on 07/02/2023)     olopatadine (PATADAY) 0.1 % ophthalmic solution Place 1 drop into both eyes 2 (two) times daily. (Patient not taking: Reported on 07/02/2023) 5 mL 12   tiZANidine (ZANAFLEX) 2 MG tablet Take 1 tablet (2 mg total) by mouth every 6 (six) hours as needed for muscle spasms. (Patient not taking: Reported on 07/02/2023) 30 tablet 1   No current facility-administered medications on file prior to visit.    Allergies  Allergen Reactions   Imdur [Isosorbide Mononitrate]     Unknown, per pt    Meloxicam Swelling    Facial swelling     Past Medical History:  Diagnosis Date   Allergy    grass etc.   Barrett's esophagus    BPH (benign prostatic hyperplasia)    Chest pain, atypical    Chronic kidney disease    kidney stones   Coronary artery disease    Diabetes mellitus without complication (HCC)    GERD (gastroesophageal reflux disease)    Barrett's   Hernia    Hyperlipidemia    Hypertension    Myocardial infarction (HCC)    Thrombocytopenia (HCC)    Ulcer 1960    Past Surgical History:  Procedure Laterality Date   CARDIAC CATHETERIZATION  02/14/2007   MITRAL REGURGITATION. LV NORMAL   CHOLECYSTECTOMY     COLONOSCOPY     CORONARY ARTERY BYPASS GRAFT     twice  2 by passes each time   INGUINAL HERNIA REPAIR     bilateral done twice   LITHOTRIPSY     SKIN CANCER EXCISION     UPPER GASTROINTESTINAL ENDOSCOPY      Social History   Tobacco Use  Smoking Status Never  Smokeless Tobacco Never    Social History   Substance and Sexual Activity  Alcohol Use No    Family History  Problem Relation Age of Onset   Stroke Father    Heart attack Mother     Heart attack Maternal Grandmother    Heart attack Maternal Grandfather    Heart disease Sister    Heart disease Brother    Heart disease Sister    Heart disease Brother    Prostate cancer Brother    Colon cancer Neg Hx    Esophageal cancer Neg Hx    Rectal cancer Neg Hx    Stomach cancer Neg Hx     Reviw of Systems:   Noted in current history.   Physical Exam: Blood pressure 124/62, pulse 70, height 5\' 6"  (1.676 m), weight 140 lb (63.5 kg), SpO2 98%.      GEN:  Well nourished, well developed in no acute distress HEENT: Normal NECK: No JVD; No carotid bruits LYMPHATICS: No lymphadenopathy CARDIAC: RRR , no murmurs, rubs, gallops RESPIRATORY:  Clear to auscultation without rales, wheezing or rhonchi  ABDOMEN: Soft, non-tender, non-distended MUSCULOSKELETAL:  No edema; No deformity  SKIN: Warm and dry NEUROLOGIC:  Alert and oriented x 3    ECG:        Assessment / Plan:    1. Coronary artery disease.    Zyion is having occasional chest discomfort and angina equivalent.  He still is able to work but frequently has episodes of chest discomfort.  He has had history of bypass grafting.  Will schedule him for [facility] study.  I have advised him to call us if his chest pain seems to worsen or if he  starts requiring more nitroglycerin.  2.  Hyperlipidemia: His current lipids look good.  Continue current medications.       3. Carotid artery disease:   .     4.  Hyperglycemia:  per his primary MD    Kristeen Miss, MD  07/02/2023 9:05 AM    [facility] [address] [place],  [address] [place], [place]  [address] Pager [phone] Phone: [phone]; Fax: [phone]

## 2023-07-02 ENCOUNTER — Telehealth (HOSPITAL_COMMUNITY): Payer: Self-pay | Admitting: *Deleted

## 2023-07-02 ENCOUNTER — Ambulatory Visit: Payer: Medicare HMO | Attending: Cardiovascular Disease | Admitting: Cardiovascular Disease

## 2023-07-02 ENCOUNTER — Encounter: Payer: Self-pay | Admitting: Cardiovascular Disease

## 2023-07-02 VITALS — BP 124/62 | HR 70 | Ht 66.0 in | Wt 140.0 lb

## 2023-07-02 DIAGNOSIS — I10 Essential (primary) hypertension: Secondary | ICD-10-CM | POA: Diagnosis not present

## 2023-07-02 DIAGNOSIS — I2510 Atherosclerotic heart disease of native coronary artery without angina pectoris: Secondary | ICD-10-CM

## 2023-07-02 DIAGNOSIS — I25118 Atherosclerotic heart disease of native coronary artery with other forms of angina pectoris: Secondary | ICD-10-CM | POA: Diagnosis not present

## 2023-07-02 DIAGNOSIS — R079 Chest pain, unspecified: Secondary | ICD-10-CM | POA: Diagnosis not present

## 2023-07-02 DIAGNOSIS — I2089 Other forms of angina pectoris: Secondary | ICD-10-CM

## 2023-07-02 NOTE — Telephone Encounter (Signed)
Left message on voicemail per DPR in reference to upcoming appointment scheduled on 07/06/2023 at 7:30 with detailed instructions given per Myocardial Perfusion Study Information Sheet for the test. LM to arrive 15 minutes early, and that it is imperative to arrive on time for appointment to keep from having the test rescheduled. If you need to cancel or reschedule your appointment, please call the office within 24 hours of your appointment. Failure to do so may result in a cancellation of your appointment, and a $50 no show fee. Phone number given for call back for any questions.

## 2023-07-02 NOTE — Patient Instructions (Signed)
Medication Instructions:  Your physician recommends that you continue on your current medications as directed. Please refer to the Current Medication list given to you today.  *If you need a refill on your cardiac medications before your next appointment, please call your pharmacy*  Lab Work: NONE If you have labs (blood work) drawn today and your tests are completely normal, you will receive your results only by: MyChart Message (if you have MyChart) OR A paper copy in the mail If you have any lab test that is abnormal or we need to change your treatment, we will call you to review the results.  Testing/Procedures: Steffanie Dunn Stress test Your physician has requested that you have a lexiscan myoview. For further information please visit [email or web address] Please follow instruction sheet, as given.  Follow-Up: At [facility], you and your health needs are our priority.  As part of our continuing mission to provide you with exceptional heart care, we have created designated Provider Care Teams.  These Care Teams include your primary Cardiologist (physician) and Advanced Practice Providers (APPs -  Physician Assistants and Nurse Practitioners) who all work together to provide you with the care you need, when you need it.  We recommend signing up for the patient portal called "MyChart".  Sign up information is provided on this After Visit Summary.  MyChart is used to connect with patients for Virtual Visits (Telemedicine).  Patients are able to view lab/test results, encounter notes, upcoming appointments, etc.  Non-urgent messages can be sent to your provider as well.   To learn more about what you can do with MyChart, go to [email or web address].    Your next appointment:   6 month(s)  Provider:   Kristeen Miss, MD  or APP    Other Instructions See separate sheet for stress test instructions

## 2023-07-04 ENCOUNTER — Other Ambulatory Visit: Payer: Self-pay | Admitting: Family Medicine

## 2023-07-06 ENCOUNTER — Ambulatory Visit (HOSPITAL_COMMUNITY): Payer: Medicare HMO | Attending: Cardiovascular Disease

## 2023-07-06 DIAGNOSIS — I2089 Other forms of angina pectoris: Secondary | ICD-10-CM | POA: Diagnosis not present

## 2023-07-06 DIAGNOSIS — R079 Chest pain, unspecified: Secondary | ICD-10-CM | POA: Diagnosis not present

## 2023-07-06 DIAGNOSIS — I2510 Atherosclerotic heart disease of native coronary artery without angina pectoris: Secondary | ICD-10-CM | POA: Insufficient documentation

## 2023-07-06 DIAGNOSIS — I25118 Atherosclerotic heart disease of native coronary artery with other forms of angina pectoris: Secondary | ICD-10-CM | POA: Insufficient documentation

## 2023-07-06 DIAGNOSIS — I10 Essential (primary) hypertension: Secondary | ICD-10-CM | POA: Insufficient documentation

## 2023-07-06 LAB — MYOCARDIAL PERFUSION IMAGING
LV dias vol: 87 mL (ref 62–150)
LV sys vol: 32 mL
Nuc Stress EF: 64 %
Peak HR: 91 {beats}/min
Rest HR: 65 {beats}/min
Rest Nuclear Isotope Dose: 10.4 mCi
SDS: 0
SRS: 0
SSS: 0
ST Depression (mm): 0 mm
Stress Nuclear Isotope Dose: 32.6 mCi
TID: 1.1

## 2023-07-06 MED ORDER — TECHNETIUM TC 99M TETROFOSMIN IV KIT
10.4000 | PACK | Freq: Once | INTRAVENOUS | Status: AC | PRN
  Administered 2023-07-06: 10.4 via INTRAVENOUS

## 2023-07-06 MED ORDER — REGADENOSON 0.4 MG/5ML IV SOLN
0.4000 mg | Freq: Once | INTRAVENOUS | Status: AC
  Administered 2023-07-06: 0.4 mg via INTRAVENOUS

## 2023-07-06 MED ORDER — TECHNETIUM TC 99M TETROFOSMIN IV KIT
32.6000 | PACK | Freq: Once | INTRAVENOUS | Status: AC | PRN
  Administered 2023-07-06: 32.6 via INTRAVENOUS

## 2023-07-06 NOTE — Telephone Encounter (Signed)
OV  Requested Prescriptions  Pending Prescriptions Disp Refills   Accu-Chek Softclix Lancets lancets [Pharmacy Med Name: Accu-Chek Softclix Lancets Miscellaneous] 100 each 3    Sig: TEST BLOOD SUGAR ONE TIME DAILY     Endocrinology: Diabetes - Testing Supplies Failed - 07/04/2023  9:19 AM      Failed - Valid encounter within last 12 months    Recent Outpatient Visits           1 year ago LLQ abdominal pain   [facility] Pickard, Priscille Heidelberg, MD   1 year ago Encounter for Medicare annual wellness exam   [facility] Pickard, Priscille Heidelberg, MD   1 year ago Plantar fasciitis   [facility] Donita Brooks, MD   2 years ago Controlled type 2 diabetes mellitus with complication, without long-term current use of insulin (HCC)   [facility] Donita Brooks, MD   2 years ago Controlled type 2 diabetes mellitus with complication, without long-term current use of insulin (HCC)   [facility] Pickard, Priscille Heidelberg, MD       Future Appointments             In 5 months Nahser, Deloris Ping, MD [facility] at [facility]             ACCU-CHEK GUIDE test strip [place] Med Name: Accu-Chek Guide In Vitro Strip] 100 strip 3    Sig: TEST BLOOD SUGAR ONE TIME DAILY AS DIRECTED     Endocrinology: Diabetes - Testing Supplies Failed - 07/04/2023  9:19 AM      Failed - Valid encounter within last 12 months    Recent Outpatient Visits           1 year ago LLQ abdominal pain   [facility] Pickard, Priscille Heidelberg, MD   1 year ago Encounter for Medicare annual wellness exam   [facility] Pickard, Priscille Heidelberg, MD   1 year ago Plantar fasciitis   [facility] Donita Brooks, MD   2 years ago Controlled type 2 diabetes mellitus with complication, without long-term current use of insulin (HCC)   [facility] Pickard, Priscille Heidelberg, MD   2 years ago  Controlled type 2 diabetes mellitus with complication, without long-term current use of insulin (HCC)   [facility] Pickard, Priscille Heidelberg, MD       Future Appointments             In 5 months Nahser, Deloris Ping, MD [facility]             Alcohol Swabs (DROPSAFE ALCOHOL PREP) 70 % PADS [Pharmacy Med Name: DropSafe Alcohol Prep Pad 70 %] 100 each 3    Sig: USE TOPICALLY ONE TIME DAILY     Off-Protocol Failed - 07/04/2023  9:19 AM      Failed - Medication not assigned to a protocol, review manually.      Failed - Valid encounter within last 12 months    Recent Outpatient Visits           1 year ago LLQ abdominal pain   [facility] Pickard, Priscille Heidelberg, MD   1 year ago Encounter for Medicare annual wellness exam   [facility] Pickard, Priscille Heidelberg, MD   1 year ago Plantar fasciitis   [facility] Donita Brooks, MD   2 years ago Controlled type 2 diabetes mellitus with complication, without long-term current use of insulin (HCC)   [facility] Donita Brooks, MD   2 years ago Controlled type 2 diabetes mellitus with complication, without long-term current use of insulin (HCC)   [facility] Pickard, Priscille Heidelberg, MD       Future Appointments             In 5 months Nahser, Deloris Ping, MD [facility] at [facility], [facility]             05/11/23

## 2023-07-07 ENCOUNTER — Telehealth: Payer: Self-pay | Admitting: Cardiovascular Disease

## 2023-07-07 NOTE — Telephone Encounter (Signed)
The patient has been notified of the result and verbalized understanding.  All questions (if any) were answered. Loa Socks, LPN 6/57/8469 6:29 AM

## 2023-07-07 NOTE — Telephone Encounter (Signed)
Follow Up:       Patient is returning aIvy's call, concerning his test results.

## 2023-07-07 NOTE — Telephone Encounter (Signed)
-----   Message from Kristeen Miss sent at 07/06/2023  4:51 PM EDT ----- Low risk, normal lexiscan myoview  Normal LV systolic function

## 2023-07-19 ENCOUNTER — Telehealth: Payer: Self-pay | Admitting: Cardiovascular Disease

## 2023-07-19 NOTE — Telephone Encounter (Signed)
Pt states he would like to speak with the nurse about his medication. Please advise

## 2023-07-19 NOTE — Telephone Encounter (Signed)
Returned call to patient, no answer and states voicemail box is full. Will re-attempt later.

## 2023-07-20 ENCOUNTER — Encounter: Payer: Self-pay | Admitting: Physician Assistant

## 2023-07-20 ENCOUNTER — Ambulatory Visit: Payer: Medicare HMO | Admitting: Physician Assistant

## 2023-07-20 VITALS — BP 108/56 | HR 69 | Ht 68.0 in | Wt 143.0 lb

## 2023-07-20 DIAGNOSIS — R634 Abnormal weight loss: Secondary | ICD-10-CM

## 2023-07-20 DIAGNOSIS — K529 Noninfective gastroenteritis and colitis, unspecified: Secondary | ICD-10-CM | POA: Diagnosis not present

## 2023-07-20 MED ORDER — COLESTIPOL HCL 1 G PO TABS: 2.0000 g | ORAL_TABLET | Freq: Two times a day (BID) | ORAL | 5 refills | Status: DC

## 2023-07-20 NOTE — Patient Instructions (Signed)
We have sent the following medications to your pharmacy for you to pick up at your convenience:  Colestid  Increase Glucerna to 2-3 times per day.  Please follow up as needed.  _______________________________________________________  If your blood pressure at your visit was 140/90 or greater, please contact your primary care physician to follow up on this.  _______________________________________________________  If you are age 83 or older, your body mass index should be between 23-30. Your Body mass index is 21.74 kg/m. If this is out of the aforementioned range listed, please consider follow up with your Primary Care Provider.  If you are age 70 or younger, your body mass index should be between 19-25. Your Body mass index is 21.74 kg/m. If this is out of the aformentioned range listed, please consider follow up with your Primary Care Provider.   ________________________________________________________  The [place] providers would like to encourage you to use [facility] to communicate with providers for non-urgent requests or questions.  Due to long hold times on the telephone, sending your provider a message by [facility] may be a faster and more efficient way to get a response.  Please allow 48 business hours for a response.  Please remember that this is for non-urgent requests.  _______________________________________________________

## 2023-07-20 NOTE — Progress Notes (Signed)
Chief Complaint: Follow up Diarrhea  HPI:    Andrew [name] is an 82 year old Caucasian male with a past medical history as listed below, known to Dr. Adela Lank, who returns to clinic today for follow-up of his diarrhea and weight loss.  04/27/2011 colonoscopy with Dr. Jarold Motto with severe diverticulosis in the sigmoid/descending colon, sessile polyp in the sigmoid colon and otherwise normal.  Repeat recommended in 10 years.  Recall patient recently had GI pathogen panel and ova and parasite exam ordered which was negative/normal.  Previously 07/15/2018 CBC, CMP and TSH were.      08/03/2018 patient seen in clinic and described that for 3 to 4 weeks he started with some loose stool out of the blue had at least 2-3 loose/watery stools per day and sometimes up to 6/day.  Stopping Losartan did not help.  Usually would use Imodium which would stop him up for 2 to 3 days but then it would come right back.  At that time recommend increasing fiber as well as starting Align.  Ordered C. difficile testing.  All stool testing was negative.    09/07/2018 patient followed in clinic and at that time the gushing of his stools I decreased with Align and fiber daily.  He was scheduled for colon in the [facility].    09/14/2018 colonoscopy with a large amount of liquid stool in the entire colon, 2 sessile polyps in the ascending colon, 2 sessile polyps in the ascending colon diminutive and the others were 3 to 4 mm in size, 3 mm polyp in the transverse colon diminutive polyp in the sigmoid and a single angiodysplastic lesion in the ascending colon multiple medium mouth diverticula.  Also internal hemorrhoids.  Pathology showed tubular adenomas.  Discussed possible repeat in 3 years given his age recommended follow-up first.  No cause for bowel changes.  Recommend low-dose Imodium as needed or trying Colestid 1 g twice daily.    02/16/2023 patient seen in clinic and at that time described chronic diarrhea.  Also 30 pound weight loss.   At that time restarted Cholestyramine twice daily.  Scheduled him for EGD and colonoscopy.  Repeated stool studies including GI path panel and fecal pancreatic elastase.    02/18/2023 stool studies negative/normal.    03/21/2023 CTAP with contrast with no acute abnormality, mild sigmoid diverticulosis without acute diverticulitis and a renal calculus.  Markedly enlarged prostate.    04/02/2023 TSH normal.    04/06/2023 EGD and colonoscopy.  Colonoscopy with two 4 mm polyps removed and diverticulosis in the sigmoid colon as well as hemorrhoids.  Biopsy showed benign mucosa with no sign of microscopic colitis and tubular adenomas.  EGD with 1 cm hiatal hernia, ectopic gastric mucosa in the upper third of the esophagus, erythematous mucosa in the gastric body.  Biopsy showed benign mucosa.    04/27/2023 fecal pancreatic elastase normal.    Today, patient presents to clinic and tells me that his diarrhea is a little bit better, he is typically having about 2 loose stools a day and he is no longer losing any weight.  His metformin has been stopped and he was started on Jardiance but he tells me he stopped that as well.  He continues on Cholestyramine 4 g twice daily but thinks it is making his teeth yellow and would like to stop it or switch to something else.  Used 1 Imodium 1 time and is not sure if it helped very much.  He is worried about trying to put weight  back on.  Tells me that he is currently taking 1 Glucerna shake a day and wonders what else he can do.    Denies fever, chills, weight loss or blood in the stool.  Past Medical History:  Diagnosis Date   Allergy    grass etc.   Barrett's esophagus    BPH (benign prostatic hyperplasia)    Chest pain, atypical    Chronic kidney disease    kidney stones   Coronary artery disease    Diabetes mellitus without complication (HCC)    GERD (gastroesophageal reflux disease)    Barrett's   Hernia    Hyperlipidemia    Hypertension    Myocardial infarction  (HCC)    Thrombocytopenia (HCC)    Ulcer 1960    Past Surgical History:  Procedure Laterality Date   CARDIAC CATHETERIZATION  02/14/2007   MITRAL REGURGITATION. LV NORMAL   CHOLECYSTECTOMY     COLONOSCOPY     CORONARY ARTERY BYPASS GRAFT     twice  2 by passes each time   INGUINAL HERNIA REPAIR     bilateral done twice   LITHOTRIPSY     SKIN CANCER EXCISION     UPPER GASTROINTESTINAL ENDOSCOPY      Current Outpatient Medications  Medication Sig Dispense Refill   ACCU-CHEK GUIDE test strip TEST BLOOD SUGAR ONE TIME DAILY AS DIRECTED 100 strip 3   Accu-Chek Softclix Lancets lancets TEST BLOOD SUGAR ONE TIME DAILY 100 each 3   Alcohol Swabs (DROPSAFE ALCOHOL PREP) 70 % PADS USE TOPICALLY ONE TIME DAILY 100 each 3   AMBULATORY NON FORMULARY MEDICATION Take 1 capsule by mouth in the morning, at noon, and at bedtime. Heather's Tummy Tamers IBS, Peppermint oil with ginger and fennel     amLODipine (NORVASC) 5 MG tablet TAKE 1 TABLET EVERY DAY 90 tablet 0   aspirin 81 MG tablet Take 81 mg by mouth daily.     augmented betamethasone dipropionate (DIPROLENE-AF) 0.05 % cream Apply topically.     Blood Glucose Monitoring Suppl (ACCU-CHEK GUIDE ME) w/Device KIT Use as instructed to monitor FSBS 1x daily. Dx: E11.9 1 kit 1   cetirizine (ZYRTEC) 10 MG tablet Take 1 tablet (10 mg total) by mouth daily. 30 tablet 0   Cholecalciferol (VITAMIN D) 2000 units CAPS Take 1 capsule by mouth daily.     cholestyramine (QUESTRAN) 4 g packet Take 1 packet (4 g total) by mouth 2 (two) times daily. 60 each 2   clobetasol cream (TEMOVATE) 0.05 % Apply 1 Application topically 2 (two) times daily.     colesevelam (WELCHOL) 625 MG tablet Take 625 mg by mouth 2 (two) times daily with a meal.     finasteride (PROSCAR) 5 MG tablet TAKE 1 TABLET(5 MG) BY MOUTH DAILY. 90 tablet 3   lansoprazole (PREVACID) 30 MG capsule TAKE 1 CAPSULE EVERY DAY 90 capsule 1   lipase/protease/amylase (CREON) 36000 UNITS CPEP capsule  Take 2 capsules (72,000 Units total) by mouth 3 (three) times daily with meals. May also take 1 capsule (36,000 Units total) as needed (with snacks). 180 capsule 11   metoprolol succinate (TOPROL-XL) 25 MG 24 hr tablet TAKE 1/2 TABLET EVERY DAY 45 tablet 3   Multiple Vitamin (MULTIVITAMIN) tablet Take 1 tablet by mouth daily.     nitroGLYCERIN (NITROSTAT) 0.4 MG SL tablet Dissolve 1 tablet under the tongue every 5 minutes as needed for chest pain. Max of 3 doses, then 911. 75 tablet 3   olopatadine (PATADAY)  0.1 % ophthalmic solution Place 1 drop into both eyes 2 (two) times daily. 5 mL 12   Omega-3 Fatty Acids (FISH OIL) 1000 MG CPDR Take 1,000 mg by mouth every morning.     simvastatin (ZOCOR) 10 MG tablet TAKE 1 TABLET AT BEDTIME 90 tablet 0   tamsulosin (FLOMAX) 0.4 MG CAPS capsule TAKE 1 CAPSULE EVERY DAY 90 capsule 3   tiZANidine (ZANAFLEX) 2 MG tablet Take 1 tablet (2 mg total) by mouth every 6 (six) hours as needed for muscle spasms. 30 tablet 1   No current facility-administered medications for this visit.    Allergies as of 07/20/2023 - Review Complete 07/20/2023  Allergen Reaction Noted   Imdur [isosorbide mononitrate]  04/01/2011   Meloxicam Swelling 12/27/2010    Family History  Problem Relation Age of Onset   Stroke Father    Heart attack Mother    Heart attack Maternal Grandmother    Heart attack Maternal Grandfather    Heart disease Sister    Heart disease Brother    Heart disease Sister    Heart disease Brother    Prostate cancer Brother    Colon cancer Neg Hx    Esophageal cancer Neg Hx    Rectal cancer Neg Hx    Stomach cancer Neg Hx     Social History   Socioeconomic History   Marital status: Married    Spouse name: Mary   Number of children: 2   Years of education: Not on file   Highest education level: Not on file  Occupational History   Occupation: Retired  Tobacco Use   Smoking status: Never   Smokeless tobacco: Never  Vaping Use   Vaping  status: Never Used  Substance and Sexual Activity   Alcohol use: No   Drug use: No   Sexual activity: Not Currently  Other Topics Concern   Not on file  Social History Narrative   Married in 1961.   6 grandchildren   Social Determinants of Health   Financial Resource Strain: Low Risk  (12/08/2022)   Overall Financial Resource Strain (CARDIA)    Difficulty of Paying Living Expenses: Not hard at all  Food Insecurity: No Food Insecurity (12/08/2022)   Hunger Vital Sign    Worried About Running Out of Food in the Last Year: Never true    Ran Out of Food in the Last Year: Never true  Transportation Needs: No Transportation Needs (12/08/2022)   PRAPARE - Administrator, Civil Service (Medical): No    Lack of Transportation (Non-Medical): No  Physical Activity: Sufficiently Active (12/08/2022)   Exercise Vital Sign    Days of Exercise per Week: 5 days    Minutes of Exercise per Session: 30 min  Stress: No Stress Concern Present (12/08/2022)   [employer] of Occupational Health - Occupational Stress Questionnaire    Feeling of Stress : Not at all  Social Connections: Socially Integrated (12/08/2022)   Social Connection and Isolation Panel [NHANES]    Frequency of Communication with Friends and Family: More than three times a week    Frequency of Social Gatherings with Friends and Family: Three times a week    Attends Religious Services: More than 4 times per year    Active Member of Clubs or Organizations: Yes    Attends Banker Meetings: More than 4 times per year    Marital Status: Married  Catering manager Violence: Not At Risk (12/08/2022)   Humiliation, Afraid, Rape,  and Kick questionnaire    Fear of Current or Ex-Partner: No    Emotionally Abused: No    Physically Abused: No    Sexually Abused: No    Review of Systems:    Constitutional: No weight loss, fever or chills Cardiovascular: No chest pain Respiratory: No SOB  Gastrointestinal: See HPI  and otherwise negative   Physical Exam:  Vital signs: BP (!) 108/56   Pulse 69   Ht 5\' 8"  (1.727 m)   Wt 143 lb (64.9 kg)   BMI 21.74 kg/m   Constitutional:   Pleasant Caucasian male appears to be in NAD, Well developed, Well nourished, alert and cooperative Respiratory: Respirations even and unlabored. Lungs clear to auscultation bilaterally.   No wheezes, crackles, or rhonchi.  Cardiovascular: Normal S1, S2. No MRG. Regular rate and rhythm. No peripheral edema, cyanosis or pallor.  Gastrointestinal:  Soft, nondistended, nontender. No rebound or guarding. Normal bowel sounds. No appreciable masses or hepatomegaly. Rectal:  Not performed.  Psychiatric: Demonstrates good judgement and reason without abnormal affect or behaviors.  RELEVANT LABS AND IMAGING: CBC    Component Value Date/Time   WBC 7.4 04/16/2023 0855   RBC 4.81 04/16/2023 0855   HGB 14.6 04/16/2023 0855   HGB 14.4 05/31/2020 0848   HCT 43.8 04/16/2023 0855   HCT 41.9 05/31/2020 0848   PLT 161 04/16/2023 0855   PLT CANCELED 05/31/2020 0848   MCV 91.1 04/16/2023 0855   MCV 87 05/31/2020 0848   MCH 30.4 04/16/2023 0855   MCHC 33.3 04/16/2023 0855   RDW 12.6 04/16/2023 0855   RDW 12.4 05/31/2020 0848   LYMPHSABS 1.7 04/16/2023 0855   MONOABS 0.6 04/16/2023 0855   EOSABS 0.1 04/16/2023 0855   BASOSABS 0.1 04/16/2023 0855    CMP     Component Value Date/Time   NA 139 04/16/2023 0855   NA 141 06/02/2022 1100   K 3.6 04/16/2023 0855   CL 108 04/16/2023 0855   CO2 25 04/16/2023 0855   GLUCOSE 175 (H) 04/16/2023 0855   BUN 25 (H) 04/16/2023 0855   BUN 20 06/02/2022 1100   CREATININE 0.78 04/16/2023 0855   CREATININE 0.86 04/02/2023 0836   CALCIUM 8.6 (L) 04/16/2023 0855   PROT 6.5 04/16/2023 0855   PROT 6.1 05/27/2021 0808   ALBUMIN 3.8 04/16/2023 0855   ALBUMIN 4.2 05/27/2021 0808   AST 16 04/16/2023 0855   ALT 15 04/16/2023 0855   ALKPHOS 54 04/16/2023 0855   BILITOT 0.5 04/16/2023 0855   BILITOT  0.4 05/27/2021 0808   GFRNONAA >60 04/16/2023 0855   GFRNONAA 82 04/09/2021 0805   GFRAA 95 04/09/2021 0805    Assessment: 1.  Chronic diarrhea: Recently negative workup, including EGD and colonoscopy as well as thyroid labs and stool studies, most likely IBS +/- bile salt induced, some benefit from Cholestyramine 4 g twice a day over the past 4 months 2.  Weight loss: This has leveled off the patient would like to gain some weight back  Plan: 1.  Patient would like to stop Cholestyramine as it is making his teeth yellow.  We will trial Colestipol 2 g twice a day, #120 with 5 refills. 2.  Discussed increasing Glucerna shakes to 2-3 times a day instead of just once a day to help him gain some weight back though I do not think that he is underweight currently. 3.  Explained that if the above is not helpful could trial Imodium on a scheduled basis 4.  Patient would like to be mailed a copy of today's visit.  He does not have access to MyChart.  We will try to accommodate. 5.  Patient will call as needed in the future.  Hyacinth Meeker, PA-C [facility] 07/20/2023, 1:37 PM  Cc: Donita Brooks, MD

## 2023-07-20 NOTE — Progress Notes (Signed)
Agree with assessment and plan as outlined.  

## 2023-07-23 NOTE — Telephone Encounter (Signed)
Pt states he is needing to have 4 teeth extracted and wanted to know how much in advance of procedure, he would need to hold aspirin. Advised that he speak directly to oral surgeon, but also that the office will likely be seeking cardiac clearance and formal answer will given at that time.

## 2023-07-28 ENCOUNTER — Other Ambulatory Visit: Payer: Medicare HMO

## 2023-07-29 ENCOUNTER — Other Ambulatory Visit: Payer: Medicare HMO

## 2023-07-29 DIAGNOSIS — E782 Mixed hyperlipidemia: Secondary | ICD-10-CM

## 2023-07-29 DIAGNOSIS — I10 Essential (primary) hypertension: Secondary | ICD-10-CM

## 2023-07-31 LAB — COMPLETE METABOLIC PANEL WITH GFR
AG Ratio: 2.3 (calc) (ref 1.0–2.5)
ALT: 14 U/L (ref 9–46)
AST: 13 U/L (ref 10–35)
Albumin: 4.3 g/dL (ref 3.6–5.1)
Alkaline phosphatase (APISO): 59 U/L (ref 35–144)
BUN: 20 mg/dL (ref 7–25)
CO2: 26 mmol/L (ref 20–32)
Calcium: 9 mg/dL (ref 8.6–10.3)
Chloride: 103 mmol/L (ref 98–110)
Creat: 0.75 mg/dL (ref 0.70–1.22)
Globulin: 1.9 g/dL (ref 1.9–3.7)
Glucose, Bld: 154 mg/dL — ABNORMAL HIGH (ref 65–99)
Potassium: 4 mmol/L (ref 3.5–5.3)
Sodium: 140 mmol/L (ref 135–146)
Total Bilirubin: 0.7 mg/dL (ref 0.2–1.2)
Total Protein: 6.2 g/dL (ref 6.1–8.1)
eGFR: 89 mL/min/{1.73_m2} (ref >=60)

## 2023-07-31 LAB — CBC WITH DIFFERENTIAL/PLATELET
Absolute Lymphocytes: 1329 {cells}/uL (ref 850–3900)
Absolute Monocytes: 460 {cells}/uL (ref 200–950)
Basophils Absolute: 38 {cells}/uL (ref 0–200)
Basophils Relative: 0.6 %
Eosinophils Absolute: 120 {cells}/uL (ref 15–500)
Eosinophils Relative: 1.9 %
HCT: 46 % (ref 38.5–50.0)
Hemoglobin: 14.1 g/dL (ref 13.2–17.1)
MCH: 29.1 pg (ref 27.0–33.0)
MCHC: 30.7 g/dL — ABNORMAL LOW (ref 32.0–36.0)
MCV: 95 fL (ref 80.0–100.0)
MPV: 10.6 fL (ref 7.5–12.5)
Monocytes Relative: 7.3 %
Neutro Abs: 4353 {cells}/uL (ref 1500–7800)
Neutrophils Relative %: 69.1 %
Platelets: 61 10*3/uL — ABNORMAL LOW (ref 140–400)
RBC: 4.84 10*6/uL (ref 4.20–5.80)
RDW: 12.6 % (ref 11.0–15.0)
Total Lymphocyte: 21.1 %
WBC: 6.3 10*3/uL (ref 3.8–10.8)

## 2023-07-31 LAB — HEMOGLOBIN A1C
Hgb A1c MFr Bld: 6.7 %{Hb} — ABNORMAL HIGH (ref <5.7)
Mean Plasma Glucose: 146 mg/dL
eAG (mmol/L): 8.1 mmol/L

## 2023-07-31 LAB — LIPID PANEL
Cholesterol: 102 mg/dL (ref <200)
HDL: 52 mg/dL (ref >=40)
LDL Cholesterol (Calc): 37 mg/dL
Non-HDL Cholesterol (Calc): 50 mg/dL (ref <130)
Total CHOL/HDL Ratio: 2 (calc) (ref <5.0)
Triglycerides: 47 mg/dL (ref <150)

## 2023-07-31 LAB — TEST AUTHORIZATION

## 2023-08-03 ENCOUNTER — Encounter: Payer: Self-pay | Admitting: Family Medicine

## 2023-08-03 ENCOUNTER — Ambulatory Visit: Payer: Medicare HMO | Admitting: Family Medicine

## 2023-08-03 VITALS — BP 118/60 | HR 76 | Temp 97.6°F | Ht 68.0 in | Wt 142.6 lb

## 2023-08-03 DIAGNOSIS — E118 Type 2 diabetes mellitus with unspecified complications: Secondary | ICD-10-CM | POA: Diagnosis not present

## 2023-08-03 NOTE — Progress Notes (Signed)
Subjective:    Patient ID: Andrew [name], male    DOB: November 13, 1940, 82 y.o.   MRN: [id number] 04/02/23 Patient is concerned because he continues to lose weight.  He continues to have loose stool.  However the frequency is much better.  Is now 1 or 2 a day.  They are loose and runny per his report.  He also has urgency at times.  His blood sugars are all less than 180 at night.  His fasting blood sugars are well-controlled.  He is on Jardiance for diabetes which also contribute to [facility] he had to stop form due to diarrhea.  We recently performed a CT scan of the abdomen and pelvis with results below.  I performed a prostate exam today which showed diffuse prostatic enlargement and hypertrophy but no nodularity or tenderness.  He is having some low back pain roughly around the level of L3-L4 however CT scan did show significant degenerative disc disease with anterior listhesis in the lower lumbar spine per my report.  FINDINGS: Lower chest: Moderate elevation of left hemidiaphragm. Dense mitral valve annular calcifications are present. The right atrium is dilated.   Hepatobiliary: No focal liver abnormality is seen. Status post cholecystectomy. No biliary dilatation.   Pancreas: Unremarkable. No pancreatic ductal dilatation or surrounding inflammatory changes.   Spleen: Visualized portions of the spleen are unremarkable.   Adrenals/Urinary Tract: Adrenal glands are normal.   Nonobstructing right renal calculus measuring 3 mm. Kidneys and ureters otherwise unremarkable. Focal calcifications noted in the anterior bladder wall at the site of the urachus measuring 3 and 2 mm. Bladder is otherwise normal.   Stomach/Bowel: No bowel dilatation to indicate ileus or obstruction. Appendix is normal. Mild sigmoid diverticulosis without evidence of acute diverticulitis.   Vascular/Lymphatic: No enlarged abdominal or pelvic lymph nodes. Calcified atheromatous plaque seen throughout the abdominal  aorta and visualized branches.   Reproductive: Markedly enlarged prostate again seen with focal low-density structure within the left side measuring 10 mm. This low-density lesion is unchanged since 07/11/2021 which favors a benign etiology.   Other: No abdominal wall hernia or abnormality. No abdominopelvic ascites.   Musculoskeletal: No acute or significant osseous findings.   IMPRESSION: 1. No acute abnormality of the abdomen or pelvis. 2. Mild sigmoid diverticulosis without evidence of acute diverticulitis. 3. Nonobstructing right renal calculus measuring 3 mm. 4. Markedly enlarged prostate.  At that time, my plan was:  Because of the loose stool I recommended that he add fiber.  The frequency is much better.  However hopefully the fiber will help with the consistency.  Because of the weight loss I want him to stop Jardiance.  As long as his blood sugars are under 200 I can except at this point in his life.  We will get his fasting lab work today.  I will check a PSA however his rectal exam shows BPH.  I suspect the weight loss is time primarily to the diarrhea and so I want to wait for results of the colonoscopy.  04/23/23 Colonoscopy and endoscopy were unremarkable.  Biopsies were unremarkable.  GI found no explanation for his weight loss or chronic diarrhea.  Therefore the most likely explanation for his chronic diarrhea would be irritable bowel syndrome with diarrhea although I have not worked up the patient for malabsorptive diarrhea due to pancreatic insufficiency.  All lab work has been unremarkable. At that time, my plan was: At this point, I can find no explanation for the patient's symptoms.  Most likely explanation is irritable bowel syndrome with diarrhea versus exocrine pancreatic insufficiency.  I will check a fecal elastase.  Regardless, may consider empiric treatment with Creon 2 tablets with each meal to see if that helps with weight loss and diarrhea.  I have asked the  patient to discontinue Jardiance.  The weight loss began long before the Jardiance but is certainly not helping.  At this point as long as his sugars are under 200 I would not treat them.  05/11/23 Stool fecal elastase was negative.  Therefore I believe the patient does not have pancreatic insufficiency.  Patient is here today to discuss this further.  I explained to the patient that CT scans of the abdomen and pelvis have been negative, fecal cultures have been negative, fecal elastase has been negative, colonoscopy has been negative.  At this point, I have no explanation other than irritable bowel syndrome.  At that time, my plan was: An extensive workup to date suggest irritable bowel syndrome with diarrhea.  I recommended that he continue the cholestyramine with Metamucil.  Unfortunately I have no other options for him.  Recommended repeating his lab work in December to monitor his A1c admitted staying off [place] and liberalizing his diet to achieve more weight gain.  As long as he can keep his sugars under 180 I am satisfied with that value.  08/03/23 Weight loss is stable: Wt Readings from Last 3 Encounters:  08/03/23 142 lb 9.6 oz (64.7 kg)  07/20/23 143 lb (64.9 kg)  07/06/23 140 lb (63.5 kg)  Most recent labs are listed below: Lab on 07/29/2023  Component Date Value Ref Range Status   WBC 07/29/2023 6.3  3.8 - 10.8 Thousand/uL Final   RBC 07/29/2023 4.84  4.20 - 5.80 Million/uL Final   Hemoglobin 07/29/2023 14.1  13.2 - 17.1 g/dL Final   HCT 16/07/9603 46.0  38.5 - 50.0 % Final   MCV 07/29/2023 95.0  80.0 - 100.0 fL Final   MCH 07/29/2023 29.1  27.0 - 33.0 pg Final   MCHC 07/29/2023 30.7 (L)  32.0 - 36.0 g/dL Final   Comment: For adults, a slight decrease in the calculated MCHC value (in the range of 30 to 32 g/dL) is most likely not clinically significant; however, it should be interpreted with caution in correlation with other red cell parameters and the patient's  clinical condition.    RDW 07/29/2023 12.6  11.0 - 15.0 % Final   Platelets 07/29/2023 61 (L)  140 - 400 Thousand/uL Final   Platelet clumps noted on smear-count appears adequate.   MPV 07/29/2023 10.6  7.5 - 12.5 fL Final   Neutro Abs 07/29/2023 4,353  1,500 - 7,800 cells/uL Final   Absolute Lymphocytes 07/29/2023 1,329  850 - 3,900 cells/uL Final   Absolute Monocytes 07/29/2023 460  200 - 950 cells/uL Final   Eosinophils Absolute 07/29/2023 120  15 - 500 cells/uL Final   Basophils Absolute 07/29/2023 38  0 - 200 cells/uL Final   Neutrophils Relative % 07/29/2023 69.1  % Final   Total Lymphocyte 07/29/2023 21.1  % Final   Monocytes Relative 07/29/2023 7.3  % Final   Eosinophils Relative 07/29/2023 1.9  % Final   Basophils Relative 07/29/2023 0.6  % Final   Glucose, Bld 07/29/2023 154 (H)  65 - 99 mg/dL Final   Comment: .            Fasting reference interval . For someone without known diabetes, a glucose value >125 mg/dL  indicates that they may have diabetes and this should be confirmed with a follow-up test. .    BUN 07/29/2023 20  7 - 25 mg/dL Final   Creat 91/47/8295 0.75  0.70 - 1.22 mg/dL Final   eGFR 62/13/0865 89  > OR = 60 mL/min/1.71m2 Final   BUN/Creatinine Ratio 07/29/2023 SEE NOTE:  6 - 22 (calc) Final   Comment:    Not Reported: BUN and Creatinine are within    reference range. .    Sodium 07/29/2023 140  135 - 146 mmol/L Final   Potassium 07/29/2023 4.0  3.5 - 5.3 mmol/L Final   Chloride 07/29/2023 103  98 - 110 mmol/L Final   CO2 07/29/2023 26  20 - 32 mmol/L Final   Calcium 07/29/2023 9.0  8.6 - 10.3 mg/dL Final   Total Protein 78/46/9629 6.2  6.1 - 8.1 g/dL Final   Albumin 52/84/1324 4.3  3.6 - 5.1 g/dL Final   Globulin 40/07/2724 1.9  1.9 - 3.7 g/dL (calc) Final   AG Ratio 07/29/2023 2.3  1.0 - 2.5 (calc) Final   Total Bilirubin 07/29/2023 0.7  0.2 - 1.2 mg/dL Final   Alkaline phosphatase (APISO) 07/29/2023 59  35 - 144 U/L Final   AST 07/29/2023 13   10 - 35 U/L Final   ALT 07/29/2023 14  9 - 46 U/L Final   Cholesterol 07/29/2023 102  <200 mg/dL Final   HDL 36/64/4034 52  > OR = 40 mg/dL Final   Triglycerides 74/25/9563 47  <150 mg/dL Final   LDL Cholesterol (Calc) 07/29/2023 37  mg/dL (calc) Final   Comment: Reference range: <100 . Desirable range <100 mg/dL for primary prevention;   <70 mg/dL for patients with CHD or diabetic patients  with > or = 2 CHD risk factors. [name] LDL-C is now calculated using the Martin-Hopkins  calculation, which is a validated novel method providing  better accuracy than the Friedewald equation in the  estimation of LDL-C.  Horald Pollen et al. Lenox Ahr. 8756;433(29): 2061-2068  (http://education.[facility].com/faq/FAQ164)    Total CHOL/HDL Ratio 07/29/2023 2.0  <5.1 (calc) Final   Non-HDL Cholesterol (Calc) 07/29/2023 50  <130 mg/dL (calc) Final   Comment: For patients with diabetes plus 1 major ASCVD risk  factor, treating to a non-HDL-C goal of <100 mg/dL  (LDL-C of <88 mg/dL) is considered a therapeutic  option.    Hgb A1c MFr Bld 07/29/2023 6.7 (H)  <5.7 % of total Hgb Final   Comment: For someone without known diabetes, a hemoglobin A1c value of 6.5% or greater indicates that they may have  diabetes and this should be confirmed with a follow-up  test. . For someone with known diabetes, a value <7% indicates  that their diabetes is well controlled and a value  greater than or equal to 7% indicates suboptimal  control. A1c targets should be individualized based on  duration of diabetes, age, comorbid conditions, and  other considerations. . Currently, no consensus exists regarding use of hemoglobin A1c for diagnosis of diabetes for children. .    Mean Plasma Glucose 07/29/2023 146  mg/dL Final   eAG (mmol/L) 41/66/0630 8.1  mmol/L Final   TEST NAME: 07/29/2023 HEMOGLOBIN A1c WITH eAG   Final   TEST CODE: 07/29/2023 [id number]   Final   CLIENT CONTACT: 07/29/2023 Broadus John T Jasmyne Lodato   Final    REPORT ALWAYS MESSAGE SIGNATURE 07/29/2023    Final   Comment: . The laboratory testing on this patient was verbally requested or  confirmed by the ordering physician or his or her authorized representative after contact with an employee of [employer]. Federal regulations require that we maintain on file written authorization for all laboratory testing.  Accordingly we are asking that the ordering physician or his or her authorized representative sign a copy of this report and promptly return it to the client service representative. . . Signature:____________________________________________________ . Please fax this signed page to [phone] or return it via your [employer] courier.   Appointment on 07/06/2023  Component Date Value Ref Range Status   Rest Nuclear Isotope Dose 07/06/2023 10.4  mCi Final   Stress Nuclear Isotope Dose 07/06/2023 32.6  mCi Final   Rest HR 07/06/2023 65.0  bpm Final   Rest BP 07/06/2023 143/72  mmHg Final   Peak HR 07/06/2023 91  bpm Final   Peak BP 07/06/2023 152/71  mmHg Final   SSS 07/06/2023 0.0   Final   SRS 07/06/2023 0.0   Final   SDS 07/06/2023 0.0   Final   TID 07/06/2023 1.10   Final   LV sys vol 07/06/2023 32.0  mL Final   LV dias vol 07/06/2023 87.0  62 - 150 mL Final   Nuc Stress EF 07/06/2023 64  % Final   ST Depression (mm) 07/06/2023 0  mm Final   Patient's blood pressure today is outstanding at 118/60.  His cholesterol is wonderful with an LDL cholesterol well below 55.  The patient states that his diarrhea has improved on the cholestyramine he is having 2 loose stools a day but this is manageable.  His last hemoglobin A1c was acceptable at 6.7 despite stopping Jardiance and metformin.  He is still concerned about the weight loss however I am reassured by the fact that his weight is stable and actually increased 2 pounds from his visit in July.  Otherwise he is doing well with no concerns.  Past Medical History:   Diagnosis Date   Allergy    grass etc.   Barrett's esophagus    BPH (benign prostatic hyperplasia)    Chest pain, atypical    Chronic kidney disease    kidney stones   Coronary artery disease    Diabetes mellitus without complication (HCC)    GERD (gastroesophageal reflux disease)    Barrett's   Hernia    Hyperlipidemia    Hypertension    Myocardial infarction (HCC)    Thrombocytopenia (HCC)    Ulcer 1960   Past Surgical History:  Procedure Laterality Date   CARDIAC CATHETERIZATION  02/14/2007   MITRAL REGURGITATION. LV NORMAL   CHOLECYSTECTOMY     COLONOSCOPY     CORONARY ARTERY BYPASS GRAFT     twice  2 by passes each time   INGUINAL HERNIA REPAIR     bilateral done twice   LITHOTRIPSY     SKIN CANCER EXCISION     UPPER GASTROINTESTINAL ENDOSCOPY     Current Outpatient Medications on File Prior to Visit  Medication Sig Dispense Refill   ACCU-CHEK GUIDE test strip TEST BLOOD SUGAR ONE TIME DAILY AS DIRECTED 100 strip 3   Accu-Chek Softclix Lancets lancets TEST BLOOD SUGAR ONE TIME DAILY 100 each 3   Alcohol Swabs (DROPSAFE ALCOHOL PREP) 70 % PADS USE TOPICALLY ONE TIME DAILY 100 each 3   AMBULATORY NON FORMULARY MEDICATION Take 1 capsule by mouth in the morning, at noon, and at bedtime. Heather's Tummy Tamers IBS, Peppermint oil with ginger and fennel     amLODipine (  NORVASC) 5 MG tablet TAKE 1 TABLET EVERY DAY 90 tablet 0   aspirin 81 MG tablet Take 81 mg by mouth daily.     augmented betamethasone dipropionate (DIPROLENE-AF) 0.05 % cream Apply topically.     Blood Glucose Monitoring Suppl (ACCU-CHEK GUIDE ME) w/Device KIT Use as instructed to monitor FSBS 1x daily. Dx: E11.9 1 kit 1   cetirizine (ZYRTEC) 10 MG tablet Take 1 tablet (10 mg total) by mouth daily. 30 tablet 0   Cholecalciferol (VITAMIN D) 2000 units CAPS Take 1 capsule by mouth daily.     cholestyramine (QUESTRAN) 4 g packet Take 1 packet (4 g total) by mouth 2 (two) times daily. 60 each 2   clobetasol  cream (TEMOVATE) 0.05 % Apply 1 Application topically 2 (two) times daily.     colesevelam (WELCHOL) 625 MG tablet Take 625 mg by mouth 2 (two) times daily with a meal.     colestipol (COLESTID) 1 g tablet Take 2 tablets (2 g total) by mouth 2 (two) times daily. 120 tablet 5   finasteride (PROSCAR) 5 MG tablet TAKE 1 TABLET(5 MG) BY MOUTH DAILY. 90 tablet 3   lansoprazole (PREVACID) 30 MG capsule TAKE 1 CAPSULE EVERY DAY 90 capsule 1   lipase/protease/amylase (CREON) 36000 UNITS CPEP capsule Take 2 capsules (72,000 Units total) by mouth 3 (three) times daily with meals. May also take 1 capsule (36,000 Units total) as needed (with snacks). 180 capsule 11   metoprolol succinate (TOPROL-XL) 25 MG 24 hr tablet TAKE 1/2 TABLET EVERY DAY 45 tablet 3   Multiple Vitamin (MULTIVITAMIN) tablet Take 1 tablet by mouth daily.     nitroGLYCERIN (NITROSTAT) 0.4 MG SL tablet Dissolve 1 tablet under the tongue every 5 minutes as needed for chest pain. Max of 3 doses, then 911. 75 tablet 3   olopatadine (PATADAY) 0.1 % ophthalmic solution Place 1 drop into both eyes 2 (two) times daily. 5 mL 12   Omega-3 Fatty Acids (FISH OIL) 1000 MG CPDR Take 1,000 mg by mouth every morning.     simvastatin (ZOCOR) 10 MG tablet TAKE 1 TABLET AT BEDTIME 90 tablet 0   tamsulosin (FLOMAX) 0.4 MG CAPS capsule TAKE 1 CAPSULE EVERY DAY 90 capsule 3   tiZANidine (ZANAFLEX) 2 MG tablet Take 1 tablet (2 mg total) by mouth every 6 (six) hours as needed for muscle spasms. 30 tablet 1   No current facility-administered medications on file prior to visit.   Allergies  Allergen Reactions   Imdur [Isosorbide Mononitrate]     Unknown, per pt    Meloxicam Swelling    Facial swelling    Social History   Socioeconomic History   Marital status: Married    Spouse name: Mary   Number of children: 2   Years of education: Not on file   Highest education level: Not on file  Occupational History   Occupation: Retired  Tobacco Use    Smoking status: Never   Smokeless tobacco: Never  Vaping Use   Vaping status: Never Used  Substance and Sexual Activity   Alcohol use: No   Drug use: No   Sexual activity: Not Currently  Other Topics Concern   Not on file  Social History Narrative   Married in 1961.   6 grandchildren   Social Determinants of Health   Financial Resource Strain: Low Risk  (12/08/2022)   Overall Financial Resource Strain (CARDIA)    Difficulty of Paying Living Expenses: Not hard at all  Food Insecurity: No Food Insecurity (12/08/2022)   Hunger Vital Sign    Worried About Running Out of Food in the Last Year: Never true    Ran Out of Food in the Last Year: Never true  Transportation Needs: No Transportation Needs (12/08/2022)   PRAPARE - Administrator, Civil Service (Medical): No    Lack of Transportation (Non-Medical): No  Physical Activity: Sufficiently Active (12/08/2022)   Exercise Vital Sign    Days of Exercise per Week: 5 days    Minutes of Exercise per Session: 30 min  Stress: No Stress Concern Present (12/08/2022)   [employer] of Occupational Health - Occupational Stress Questionnaire    Feeling of Stress : Not at all  Social Connections: Socially Integrated (12/08/2022)   Social Connection and Isolation Panel [NHANES]    Frequency of Communication with Friends and Family: More than three times a week    Frequency of Social Gatherings with Friends and Family: Three times a week    Attends Religious Services: More than 4 times per year    Active Member of Clubs or Organizations: Yes    Attends Banker Meetings: More than 4 times per year    Marital Status: Married  Catering manager Violence: Not At Risk (12/08/2022)   Humiliation, Afraid, Rape, and Kick questionnaire    Fear of Current or Ex-Partner: No    Emotionally Abused: No    Physically Abused: No    Sexually Abused: No      Review of Systems  All other systems reviewed and are negative.       Objective:   Physical Exam Vitals reviewed.  Constitutional:      General: He is not in acute distress.    Appearance: Normal appearance. He is not ill-appearing or toxic-appearing.  Cardiovascular:     Rate and Rhythm: Normal rate and regular rhythm.     Heart sounds: Normal heart sounds. No murmur heard.    No friction rub. No gallop.  Pulmonary:     Effort: Pulmonary effort is normal. No respiratory distress.     Breath sounds: Normal breath sounds and air entry. No stridor, decreased air movement or transmitted upper airway sounds. No wheezing, rhonchi or rales.  Abdominal:     General: Bowel sounds are normal.     Palpations: Abdomen is soft.  Musculoskeletal:     Right lower leg: No edema.     Left lower leg: No edema.  Neurological:     Mental Status: He is alert.          Assessment & Plan:   Controlled type 2 diabetes mellitus with complication, without long-term current use of insulin (HCC) Patient's blood sugar is adequately controlled with a hemoglobin A1c of 6.7.  His LDL cholesterol is well below his goal of 55 given his history of coronary artery disease.  His blood pressure is well-controlled at 118/60.  Therefore I believe his risk factors are well-managed.  Regular anticipatory guidance is provided.  Recommended a flu shot and a COVID shot.  He plans to return next week to do these vaccinations

## 2023-08-11 ENCOUNTER — Ambulatory Visit (INDEPENDENT_AMBULATORY_CARE_PROVIDER_SITE_OTHER): Payer: Medicare HMO

## 2023-08-11 DIAGNOSIS — Z23 Encounter for immunization: Secondary | ICD-10-CM

## 2023-08-11 NOTE — Progress Notes (Signed)
Pt came in for flu vaccine injection. Injection given to pt L-upper arm. Pt tol injection well w/no c/o per pt. Pt left ambulatory w/no c/o

## 2023-08-18 ENCOUNTER — Other Ambulatory Visit: Payer: Self-pay | Admitting: Family Medicine

## 2023-10-20 ENCOUNTER — Other Ambulatory Visit: Payer: Self-pay | Admitting: Family Medicine

## 2023-10-22 ENCOUNTER — Ambulatory Visit: Payer: Self-pay | Admitting: Family Medicine

## 2023-10-22 MED ORDER — AMLODIPINE BESYLATE 5 MG PO TABS: 5.0000 mg | ORAL_TABLET | Freq: Every day | ORAL | 0 refills | Status: DC

## 2023-10-22 NOTE — Addendum Note (Signed)
 Addended by: Wilford Corner on: 10/22/2023 05:19 PM   Modules accepted: Orders

## 2023-10-22 NOTE — Telephone Encounter (Addendum)
 Patient requesting emergency supply be sent to local pharmacy as he is completely out of amlodipine  and only has an expired prescription that he was advised not to take.   Reason for Disposition  [1] Caller has NON-URGENT medicine question about med that PCP prescribed AND [2] triager unable to answer question  Answer Assessment - Initial Assessment Questions 1. DRUG NAME: What medicine do you need to have refilled?     Amlodipine  2. REFILLS REMAINING: How many refills are remaining? (Note: The label on the medicine or pill bottle will show how many refills are remaining. If there are no refills remaining, then a renewal may be needed.)     Mail order delayed and is out of medication  Protocols used: Medication Refill and Renewal Call-A-AH

## 2023-10-22 NOTE — Telephone Encounter (Signed)
 This encounter was created in error - please disregard.

## 2023-10-22 NOTE — Telephone Encounter (Signed)
 10 day supply sent to local pharmacy until mail order arrives.  Requested Prescriptions  Pending Prescriptions Disp Refills   amLODipine  (NORVASC ) 5 MG tablet 30 tablet 0    Sig: Take 1 tablet (5 mg total) by mouth daily.     Cardiovascular: Calcium  Channel Blockers 2 Failed - 10/22/2023  5:18 PM      Failed - Valid encounter within last 6 months    Recent Outpatient Visits           1 year ago LLQ abdominal pain   [facility] Pickard, Butler DASEN, MD   2 years ago Encounter for Medicare annual wellness exam   [facility] Duanne, Butler DASEN, MD   2 years ago Plantar fasciitis   [facility] Duanne Butler DASEN, MD   2 years ago Controlled type 2 diabetes mellitus with complication, without long-term current use of insulin (HCC)   [facility] Duanne Butler DASEN, MD   2 years ago Controlled type 2 diabetes mellitus with complication, without long-term current use of insulin (HCC)   [facility] Pickard, Butler DASEN, MD       Future Appointments             In 2 months Nahser, Aleene PARAS, MD [facility]            Passed - Last BP in normal range    BP Readings from Last 1 Encounters:  08/03/23 118/60         Passed - Last Heart Rate in normal range    Pulse Readings from Last 1 Encounters:  08/03/23 76         Refused Prescriptions Disp Refills   amLODipine  (NORVASC ) 5 MG tablet 90 tablet 3    Sig: Take 1 tablet (5 mg total) by mouth daily.     Cardiovascular: Calcium  Channel Blockers 2 Failed - 10/22/2023  5:18 PM      Failed - Valid encounter within last 6 months    Recent Outpatient Visits           1 year ago LLQ abdominal pain   [facility] Duanne, Butler DASEN, MD   2 years ago Encounter for Medicare annual wellness exam   [facility] Duanne, Butler DASEN, MD   2 years ago Plantar fasciitis   [facility] Duanne Butler DASEN, MD   2 years ago Controlled type 2 diabetes mellitus with complication, without long-term current use of insulin (HCC)   [facility] Duanne Butler DASEN, MD   2 years ago Controlled type 2 diabetes mellitus with complication, without long-term current use of insulin (HCC)   [facility] Pickard, Butler DASEN, MD       Future Appointments             In 2 months Nahser, Aleene PARAS, MD [facility] at [facility]            Passed - Last BP in normal range    BP Readings from Last 1 Encounters:  08/03/23 118/60         Passed - Last Heart Rate in normal range    Pulse Readings from Last 1 Encounters:  08/03/23 76

## 2023-10-22 NOTE — Addendum Note (Signed)
 Addended by: Garnet Koyanagi on: 10/22/2023 10:47 AM   Modules accepted: Orders

## 2023-10-22 NOTE — Telephone Encounter (Signed)
  Chief Complaint: medication question Additional Notes: patient called earlier asking for a small supply of his BP medication Amlodipine  to be sent to [facility] in [place]. Patient is awaiting his mail order prescription and it won't be to him for a few days. Patient is concerned about going without his medication. Refill to be sent in. [facility] [phone] - [place], [place] - [address] AT [facility] [address], [place] [address] Phone: [phone]  Fax: [phone] DEA #: [id number] DAW Reason: --    Copied from CRM [id number]. Topic: Clinical - Red Word Triage >> Oct 22, 2023  4:52 PM Elle L wrote: Red Word that prompted transfer to Nurse Triage: The patient has had an issue with his prescription refill, for amLODipine  (NORVASC ) 5 MG tablet,  as he has been out of it and it was mailed through mail service. He took two expired pills earlier but spoke to someone who advised against him doing that again. He is concerned about his blood pressure from being without the medication. Reason for Disposition  [1] Prescription prescribed recently is not at pharmacy AND [2] triager has access to patient's EMR AND [3] prescription is recorded in the EMR  Answer Assessment - Initial Assessment Questions 1. NAME of MEDICINE: What medicine(s) are you calling about?     Amlodipine  2. QUESTION: What is your question? (e.g., double dose of medicine, side effect)     Called earlier to get an emergency prescription to hold him over until his mailorder prescription arrives 3. PRESCRIBER: Who prescribed the medicine? Reason: if prescribed by specialist, call should be referred to that group.     [place] 4. SYMPTOMS: Do you have any symptoms? If Yes, ask: What symptoms are you having?  How bad are the symptoms (e.g., mild, moderate, severe)     No  Protocols used: Medication Question Call-A-AH

## 2023-10-22 NOTE — Telephone Encounter (Addendum)
 Copied from CRM [id number]. Topic: Clinical - Prescription Issue >> Oct 21, 2023  1:37 PM Geneva B wrote: Reason for CRM: patient has a rx that expires in 07/2023 and he wants to know if he can take those pills until he gets his new refill coming through the mail please call patient back [phone]  The patient's Amlodipine  is not scheduled to be delivered by the mail order pharmacy until 2-3 days.  He is completely out of his prescription.  He inquired if it is okay to take an expired prescription.  Advised him not to take the expired prescription.  He has already taken 2 of them but denied any side effects.  He requested that an emergency supply be sent to [facility] on [address].

## 2023-10-25 ENCOUNTER — Other Ambulatory Visit: Payer: Self-pay

## 2023-10-25 DIAGNOSIS — I10 Essential (primary) hypertension: Secondary | ICD-10-CM

## 2023-10-25 MED ORDER — AMLODIPINE BESYLATE 5 MG PO TABS: 5.0000 mg | ORAL_TABLET | Freq: Every day | ORAL | 0 refills | Status: DC

## 2023-11-15 ENCOUNTER — Other Ambulatory Visit: Payer: Self-pay | Admitting: Family Medicine

## 2023-12-13 ENCOUNTER — Telehealth: Payer: Self-pay

## 2023-12-13 NOTE — Telephone Encounter (Signed)
 Copied from CRM [id number]. Topic: Clinical - Request for Lab/Test Order >> Dec 13, 2023 11:52 AM Geroge Baseman wrote: Reason for CRM: Patient believes he is overdue for labs but there are non available in his chart to be completed. Please call patient if is he is needing labs.

## 2023-12-14 ENCOUNTER — Other Ambulatory Visit

## 2023-12-14 DIAGNOSIS — E785 Hyperlipidemia, unspecified: Secondary | ICD-10-CM

## 2023-12-14 DIAGNOSIS — I2510 Atherosclerotic heart disease of native coronary artery without angina pectoris: Secondary | ICD-10-CM | POA: Diagnosis not present

## 2023-12-14 DIAGNOSIS — N401 Enlarged prostate with lower urinary tract symptoms: Secondary | ICD-10-CM | POA: Diagnosis not present

## 2023-12-14 DIAGNOSIS — E118 Type 2 diabetes mellitus with unspecified complications: Secondary | ICD-10-CM

## 2023-12-14 DIAGNOSIS — I10 Essential (primary) hypertension: Secondary | ICD-10-CM

## 2023-12-14 DIAGNOSIS — R350 Frequency of micturition: Secondary | ICD-10-CM | POA: Diagnosis not present

## 2023-12-14 DIAGNOSIS — R634 Abnormal weight loss: Secondary | ICD-10-CM

## 2023-12-15 LAB — CBC WITH DIFFERENTIAL/PLATELET
Absolute Lymphocytes: 1298 {cells}/uL (ref 850–3900)
Absolute Monocytes: 491 {cells}/uL (ref 200–950)
Basophils Absolute: 38 {cells}/uL (ref 0–200)
Basophils Relative: 0.6 %
Eosinophils Absolute: 151 {cells}/uL (ref 15–500)
Eosinophils Relative: 2.4 %
HCT: 41.3 % (ref 38.5–50.0)
Hemoglobin: 13.6 g/dL (ref 13.2–17.1)
MCH: 30.1 pg (ref 27.0–33.0)
MCHC: 32.9 g/dL (ref 32.0–36.0)
MCV: 91.4 fL (ref 80.0–100.0)
MPV: 11.1 fL (ref 7.5–12.5)
Monocytes Relative: 7.8 %
Neutro Abs: 4322 {cells}/uL (ref 1500–7800)
Neutrophils Relative %: 68.6 %
Platelets: 56 10*3/uL — ABNORMAL LOW (ref 140–400)
RBC: 4.52 10*6/uL (ref 4.20–5.80)
RDW: 11.9 % (ref 11.0–15.0)
Total Lymphocyte: 20.6 %
WBC: 6.3 10*3/uL (ref 3.8–10.8)

## 2023-12-15 LAB — COMPLETE METABOLIC PANEL WITH GFR
AG Ratio: 2.5 (calc) (ref 1.0–2.5)
ALT: 11 U/L (ref 9–46)
AST: 12 U/L (ref 10–35)
Albumin: 4.2 g/dL (ref 3.6–5.1)
Alkaline phosphatase (APISO): 54 U/L (ref 35–144)
BUN: 19 mg/dL (ref 7–25)
CO2: 26 mmol/L (ref 20–32)
Calcium: 8.9 mg/dL (ref 8.6–10.3)
Chloride: 104 mmol/L (ref 98–110)
Creat: 0.74 mg/dL (ref 0.70–1.22)
Globulin: 1.7 g/dL — ABNORMAL LOW (ref 1.9–3.7)
Glucose, Bld: 171 mg/dL — ABNORMAL HIGH (ref 65–99)
Potassium: 4.1 mmol/L (ref 3.5–5.3)
Sodium: 140 mmol/L (ref 135–146)
Total Bilirubin: 0.6 mg/dL (ref 0.2–1.2)
Total Protein: 5.9 g/dL — ABNORMAL LOW (ref 6.1–8.1)
eGFR: 89 mL/min/{1.73_m2} (ref >=60)

## 2023-12-15 LAB — HEMOGLOBIN A1C
Hgb A1c MFr Bld: 7.4 %{Hb} — ABNORMAL HIGH (ref <5.7)
Mean Plasma Glucose: 166 mg/dL
eAG (mmol/L): 9.2 mmol/L

## 2023-12-15 LAB — PSA: PSA: 0.24 ng/mL (ref <=4.00)

## 2023-12-15 LAB — LIPID PANEL
Cholesterol: 100 mg/dL (ref <200)
HDL: 47 mg/dL (ref >=40)
LDL Cholesterol (Calc): 39 mg/dL
Non-HDL Cholesterol (Calc): 53 mg/dL (ref <130)
Total CHOL/HDL Ratio: 2.1 (calc) (ref <5.0)
Triglycerides: 56 mg/dL (ref <150)

## 2023-12-15 LAB — TSH: TSH: 2.59 m[IU]/L (ref 0.40–4.50)

## 2023-12-15 LAB — PROTEIN / CREATININE RATIO, URINE
Creatinine, Urine: 51 mg/dL (ref 20–320)
Protein/Creat Ratio: 98 mg/g{creat} (ref 25–148)
Protein/Creatinine Ratio: 0.098 mg/mg{creat} (ref 0.025–0.148)
Total Protein, Urine: 5 mg/dL (ref 5–25)

## 2023-12-16 ENCOUNTER — Ambulatory Visit: Payer: Medicare HMO | Admitting: *Deleted

## 2023-12-16 DIAGNOSIS — Z0000 Encounter for general adult medical examination without abnormal findings: Secondary | ICD-10-CM

## 2023-12-16 NOTE — Progress Notes (Signed)
 Subjective:   Andrew [name] is a 83 y.o. male who presents for Medicare Annual/Subsequent preventive examination.  Visit Complete: Virtual I connected with  Andrew [name] on 12/16/23 by a audio enabled telemedicine application and verified that I am speaking with the correct person using two identifiers.  Patient Location: Home  Provider Location: Home Office  I discussed the limitations of evaluation and management by telemedicine. The patient expressed understanding and agreed to proceed.  Vital Signs: Because this visit was a virtual/telehealth visit, some criteria may be missing or patient reported. Any vitals not documented were not able to be obtained and vitals that have been documented are patient reported.       Objective:    There were no vitals filed for this visit. There is no height or weight on file to calculate BMI.     04/16/2023    9:36 AM 12/08/2022   11:50 AM 10/16/2022   10:14 AM 04/10/2022   10:06 AM 09/26/2021   10:42 AM 09/24/2021    3:04 PM 03/25/2021    2:21 PM  Advanced Directives  Does Patient Have a Medical Advance Directive? No Yes No No No No No  Does patient want to make changes to medical advance directive?  No - Patient declined       Would patient like information on creating a medical advance directive? No - Patient declined  No - Patient declined No - Patient declined No - Patient declined No - Patient declined No - Patient declined    Current Medications (verified) Outpatient Encounter Medications as of 12/16/2023  Medication Sig   ACCU-CHEK GUIDE test strip TEST BLOOD SUGAR ONE TIME DAILY AS DIRECTED   Accu-Chek Softclix Lancets lancets TEST BLOOD SUGAR ONE TIME DAILY   Alcohol Swabs (DROPSAFE ALCOHOL PREP) 70 % PADS USE TOPICALLY ONE TIME DAILY   AMBULATORY NON FORMULARY MEDICATION Take 1 capsule by mouth in the morning, at noon, and at bedtime. Heather's Tummy Tamers IBS, Peppermint oil with ginger and fennel   amLODipine (NORVASC) 5 MG  tablet Take 1 tablet (5 mg total) by mouth daily.   aspirin 81 MG tablet Take 81 mg by mouth daily.   augmented betamethasone dipropionate (DIPROLENE-AF) 0.05 % cream Apply topically.   Blood Glucose Monitoring Suppl (ACCU-CHEK GUIDE ME) w/Device KIT Use as instructed to monitor FSBS 1x daily. Dx: E11.9   cetirizine (ZYRTEC) 10 MG tablet Take 1 tablet (10 mg total) by mouth daily.   Cholecalciferol (VITAMIN D) 2000 units CAPS Take 1 capsule by mouth daily.   cholestyramine (QUESTRAN) 4 g packet Take 1 packet (4 g total) by mouth 2 (two) times daily.   clobetasol cream (TEMOVATE) 0.05 % Apply 1 Application topically 2 (two) times daily.   finasteride (PROSCAR) 5 MG tablet TAKE 1 TABLET(5 MG) BY MOUTH DAILY.   lansoprazole (PREVACID) 30 MG capsule TAKE 1 CAPSULE EVERY DAY   metoprolol succinate (TOPROL-XL) 25 MG 24 hr tablet TAKE 1/2 TABLET EVERY DAY   Multiple Vitamin (MULTIVITAMIN) tablet Take 1 tablet by mouth daily.   nitroGLYCERIN (NITROSTAT) 0.4 MG SL tablet Dissolve 1 tablet under the tongue every 5 minutes as needed for chest pain. Max of 3 doses, then 911.   olopatadine (PATADAY) 0.1 % ophthalmic solution Place 1 drop into both eyes 2 (two) times daily.   Omega-3 Fatty Acids (FISH OIL) 1000 MG CPDR Take 1,000 mg by mouth every morning.   simvastatin (ZOCOR) 10 MG tablet TAKE 1 TABLET AT BEDTIME  tamsulosin (FLOMAX) 0.4 MG CAPS capsule TAKE 1 CAPSULE EVERY DAY   No facility-administered encounter medications on file as of 12/16/2023.    Allergies (verified) Imdur [isosorbide mononitrate] and Meloxicam   History: Past Medical History:  Diagnosis Date   Allergy    grass etc.   Barrett's esophagus    BPH (benign prostatic hyperplasia)    Chest pain, atypical    Chronic kidney disease    kidney stones   Coronary artery disease    Diabetes mellitus without complication (HCC)    GERD (gastroesophageal reflux disease)    Barrett's   Hernia    Hyperlipidemia    Hypertension     Myocardial infarction (HCC)    Thrombocytopenia (HCC)    Ulcer 1960   Past Surgical History:  Procedure Laterality Date   CARDIAC CATHETERIZATION  02/14/2007   MITRAL REGURGITATION. LV NORMAL   CHOLECYSTECTOMY     COLONOSCOPY     CORONARY ARTERY BYPASS GRAFT     twice  2 by passes each time   INGUINAL HERNIA REPAIR     bilateral done twice   LITHOTRIPSY     SKIN CANCER EXCISION     UPPER GASTROINTESTINAL ENDOSCOPY     Family History  Problem Relation Age of Onset   Stroke Father    Heart attack Mother    Heart attack Maternal Grandmother    Heart attack Maternal Grandfather    Heart disease Sister    Heart disease Brother    Heart disease Sister    Heart disease Brother    Prostate cancer Brother    Colon cancer Neg Hx    Esophageal cancer Neg Hx    Rectal cancer Neg Hx    Stomach cancer Neg Hx    Social History   Socioeconomic History   Marital status: Married    Spouse name: Mary   Number of children: 2   Years of education: Not on file   Highest education level: Not on file  Occupational History   Occupation: Retired  Tobacco Use   Smoking status: Never   Smokeless tobacco: Never  Vaping Use   Vaping status: Never Used  Substance and Sexual Activity   Alcohol use: No   Drug use: No   Sexual activity: Not Currently  Other Topics Concern   Not on file  Social History Narrative   Married in 1961.   6 grandchildren   Social Drivers of Corporate investment banker Strain: Low Risk  (12/08/2022)   Overall Financial Resource Strain (CARDIA)    Difficulty of Paying Living Expenses: Not hard at all  Food Insecurity: No Food Insecurity (12/08/2022)   Hunger Vital Sign    Worried About Running Out of Food in the Last Year: Never true    Ran Out of Food in the Last Year: Never true  Transportation Needs: No Transportation Needs (12/08/2022)   PRAPARE - Administrator, Civil Service (Medical): No    Lack of Transportation (Non-Medical): No   Physical Activity: Sufficiently Active (12/08/2022)   Exercise Vital Sign    Days of Exercise per Week: 5 days    Minutes of Exercise per Session: 30 min  Stress: No Stress Concern Present (12/08/2022)   [employer] of Occupational Health - Occupational Stress Questionnaire    Feeling of Stress : Not at all  Social Connections: Socially Integrated (12/08/2022)   Social Connection and Isolation Panel [NHANES]    Frequency of Communication with Friends and Family:  More than three times a week    Frequency of Social Gatherings with Friends and Family: Three times a week    Attends Religious Services: More than 4 times per year    Active Member of Clubs or Organizations: Yes    Attends Banker Meetings: More than 4 times per year    Marital Status: Married    Tobacco Counseling Counseling given: Not Answered   Clinical Intake:                        Activities of Daily Living     No data to display          Patient Care Team: Donita Brooks, MD as PCP - General (Family Medicine) Nahser, Deloris Ping, MD as PCP - Cardiology (Cardiology) Erroll Luna, [facility] (Inactive) as Pharmacist (Pharmacist) Erroll Luna, [facility] (Inactive) (Pharmacist) Nita Sells, MD (Dermatology) Bjorn Pippin, MD as Attending Physician (Urology)  Indicate any recent Medical Services you may have received from other than [facility] providers in the past year (date may be approximate).     Assessment:   This is a routine wellness examination for Lavaughn.  Hearing/Vision screen No results found.   Goals Addressed   None    Depression Screen    04/23/2023    2:37 PM 02/26/2023   11:01 AM 02/04/2023    8:30 AM 12/17/2022    8:12 AM 12/08/2022    1:52 PM 11/19/2022   12:01 PM 10/16/2022   10:36 AM  PHQ 2/9 Scores  PHQ - 2 Score 0 0 0 0 0 0 0    Fall Risk    04/23/2023    2:37 PM 02/26/2023   11:01 AM 02/04/2023    8:30 AM 12/17/2022    8:12 AM 12/08/2022    1:51 PM  Fall  Risk   Falls in the past year? 0 0 0 0 0  Number falls in past yr: 0 0 0 0 0  Injury with Fall? 0 0 0 0 0  Risk for fall due to : No Fall Risks No Fall Risks No Fall Risks No Fall Risks   Follow up Falls prevention discussed Falls prevention discussed Falls prevention discussed Falls prevention discussed Falls prevention discussed;Education provided;Falls evaluation completed    MEDICARE RISK AT HOME:    TIMED UP AND GO:  Was the test performed?  No    Cognitive Function:        12/08/2022    1:54 PM 08/27/2020    8:29 AM  6CIT Screen  What Year? 0 points 0 points  What month? 0 points 0 points  What time? 0 points 0 points  Count back from 20 0 points 0 points  Months in reverse 0 points 0 points  Repeat phrase 0 points 0 points  Total Score 0 points 0 points    Immunizations Immunization History  Administered Date(s) Administered   Fluad Quad(high Dose 65+) 06/30/2019, 06/18/2020, 08/07/2021, 09/15/2022   Fluad Trivalent(High Dose 65+) 08/11/2023   Influenza Split 07/07/2012   Influenza Whole 07/18/2010   Influenza, High Dose Seasonal PF 08/16/2017, 09/07/2018   Influenza,inj,Quad PF,6+ Mos 07/24/2013, 09/10/2014, 08/30/2015, 08/21/2016   Moderna Covid-19 Vaccine Bivalent Booster 35yrs & up 12/20/2019, 01/17/2020   Moderna Sars-Covid-2 Vaccination 12/20/2019, 01/17/2020, 10/18/2020   Pneumococcal Conjugate-13 09/10/2014   Pneumococcal Polysaccharide-23 08/16/2007   Td 07/30/2003   Zoster, Live 06/11/2006    TDAP status: Due, Education has been provided regarding  the importance of this vaccine. Advised may receive this vaccine at local pharmacy or Health Dept. Aware to provide a copy of the vaccination record if obtained from local pharmacy or Health Dept. Verbalized acceptance and understanding.  Flu Vaccine status: Up to date  Pneumococcal vaccine status: Up to date  Covid-19 vaccine status: Information provided on how to obtain vaccines.   Qualifies for  Shingles Vaccine? Yes   Zostavax completed No   Shingrix Completed?: No.    Education has been provided regarding the importance of this vaccine. Patient has been advised to call insurance company to determine out of pocket expense if they have not yet received this vaccine. Advised may also receive vaccine at local pharmacy or Health Dept. Verbalized acceptance and understanding.  Screening Tests Health Maintenance  Topic Date Due   Zoster Vaccines- Shingrix (1 of 2) 12/21/1988   COVID-19 Vaccine (6 - 2024-25 season) 06/13/2023   Medicare Annual Wellness (AWV)  12/09/2023   Diabetic kidney evaluation - eGFR measurement  12/13/2024   Diabetic kidney evaluation - Urine ACR  12/13/2024   Pneumonia Vaccine 58+ Years old  Completed   INFLUENZA VACCINE  Completed   HPV VACCINES  Aged Out   DTaP/Tdap/Td  Discontinued   Colonoscopy  Discontinued    Health Maintenance  Health Maintenance Due  Topic Date Due   Zoster Vaccines- Shingrix (1 of 2) 12/21/1988   COVID-19 Vaccine (6 - 2024-25 season) 06/13/2023   Medicare Annual Wellness (AWV)  12/09/2023    Colorectal cancer screening: No longer required.   Lung Cancer Screening: (Low Dose CT Chest recommended if Age 2-80 years, 20 pack-year currently smoking OR have quit w/in 15years.) does not qualify.   Lung Cancer Screening Referral:   Additional Screening:  Hepatitis C Screening: does not qualify;   Vision Screening: Recommended annual ophthalmology exams for early detection of glaucoma and other disorders of the eye. Is the patient up to date with their annual eye exam?  Yes  Who is the provider or what is the name of the office in which the patient attends annual eye exams? Unsure of name If pt is not established with a provider, would they like to be referred to a provider to establish care? No .   Dental Screening: Recommended annual dental exams for proper oral hygiene  Nutrition Risk Assessment:  Has the patient had any  N/V/D within the last 2 months?  No  Does the patient have any non-healing wounds?  No  Has the patient had any unintentional weight loss or weight gain?  No   Diabetes:  Is the patient diabetic?  Yes  If diabetic, was a CBG obtained today?  No  Did the patient bring in their glucometer from home?  No  How often do you monitor your CBG's? 1-2 times a day.   Financial Strains and Diabetes Management:  Are you having any financial strains with the device, your supplies or your medication? No .  Does the patient want to be seen by Chronic Care Management for management of their diabetes?  No  Would the patient like to be referred to a Nutritionist or for Diabetic Management?  No   Diabetic Exams:  Diabetic Eye Exam: Completed . Advised pt to expect a call from office referred to regarding appt.  Diabetic Foot Exam: . Pt has been advised about the importance in completing this exam. .    Community Resource Referral / Chronic Care Management: CRR required this visit?  No  CCM required this visit?  No     Plan:     I have personally reviewed and noted the following in the patient's chart:   Medical and social history Use of alcohol, tobacco or illicit drugs  Current medications and supplements including opioid prescriptions. Patient is not currently taking opioid prescriptions. Functional ability and status Nutritional status Physical activity Advanced directives List of other physicians Hospitalizations, surgeries, and ER visits in previous 12 months Vitals Screenings to include cognitive, depression, and falls Referrals and appointments  In addition, I have reviewed and discussed with patient certain preventive protocols, quality metrics, and best practice recommendations. A written personalized care plan for preventive services as well as general preventive health recommendations were provided to patient.     Remi Haggard, LPN   10/17/1094   After Visit Summary:  (MyChart) Due to this being a telephonic visit, the after visit summary with patients personalized plan was offered to patient via MyChart   Nurse Notes: Patient had lots of question about Diabetes went over food options.    Patient is not on any  diabetic medications,  his blood sugars range from  the date 2-21 to present have ranged from 163, 175,185 taken at 10pm.   Patient had labs 12-14-2023 A!C elevated Glucose elevated from previous labs .      Appointment scheduled for a pulled muscle in his shoulder.  12-27-2023  .[name]   Went over return to office precautions.

## 2023-12-16 NOTE — Patient Instructions (Signed)
 Andrew [name] , Thank you for taking time to come for your Medicare Wellness Visit. I appreciate your ongoing commitment to your health goals. Please review the following plan we discussed and let me know if I can assist you in the future.   Screening recommendations/referrals: Colonoscopy: no longer required Recommended yearly ophthalmology/optometry visit for glaucoma screening and checkup Recommended yearly dental visit for hygiene and checkup  Vaccinations: Influenza vaccine: up to date Pneumococcal vaccine: up to date Tdap vaccine: Education provided Shingles vaccine: Education provided    Advanced directives: Education provided   Preventive Care 65 Years and Older, Male Preventive care refers to lifestyle choices and visits with your health care provider that can promote health and wellness. What does preventive care include? A yearly physical exam. This is also called an annual well check. Dental exams once or twice a year. Routine eye exams. Ask your health care provider how often you should have your eyes checked. Personal lifestyle choices, including: Daily care of your teeth and gums. Regular physical activity. Eating a healthy diet. Avoiding tobacco and drug use. Limiting alcohol use. Practicing safe sex. Taking low doses of aspirin every day. Taking vitamin and mineral supplements as recommended by your health care provider. What happens during an annual well check? The services and screenings done by your health care provider during your annual well check will depend on your age, overall health, lifestyle risk factors, and family history of disease. Counseling  Your health care provider may ask you questions about your: Alcohol use. Tobacco use. Drug use. Emotional well-being. Home and relationship well-being. Sexual activity. Eating habits. History of falls. Memory and ability to understand (cognition). Work and work Astronomer. Screening  You may have the  following tests or measurements: Height, weight, and BMI. Blood pressure. Lipid and cholesterol levels. These may be checked every 5 years, or more frequently if you are over 34 years old. Skin check. Lung cancer screening. You may have this screening every year starting at age 48 if you have a 30-pack-year history of smoking and currently smoke or have quit within the past 15 years. Fecal occult blood test (FOBT) of the stool. You may have this test every year starting at age 75. Flexible sigmoidoscopy or colonoscopy. You may have a sigmoidoscopy every 5 years or a colonoscopy every 10 years starting at age 67. Prostate cancer screening. Recommendations will vary depending on your family history and other risks. Hepatitis C blood test. Hepatitis B blood test. Sexually transmitted disease (STD) testing. Diabetes screening. This is done by checking your blood sugar (glucose) after you have not eaten for a while (fasting). You may have this done every 1-3 years. Abdominal aortic aneurysm (AAA) screening. You may need this if you are a current or former smoker. Osteoporosis. You may be screened starting at age 66 if you are at high risk. Talk with your health care provider about your test results, treatment options, and if necessary, the need for more tests. Vaccines  Your health care provider may recommend certain vaccines, such as: Influenza vaccine. This is recommended every year. Tetanus, diphtheria, and acellular pertussis (Tdap, Td) vaccine. You may need a Td booster every 10 years. Zoster vaccine. You may need this after age 32. Pneumococcal 13-valent conjugate (PCV13) vaccine. One dose is recommended after age 47. Pneumococcal polysaccharide (PPSV23) vaccine. One dose is recommended after age 38. Talk to your health care provider about which screenings and vaccines you need and how often you need them. This information is  not intended to replace advice given to you by your health care  provider. Make sure you discuss any questions you have with your health care provider. Document Released: 10/25/2015 Document Revised: 06/17/2016 Document Reviewed: 07/30/2015 Elsevier Interactive Patient Education  2017 [employer].  Fall Prevention in the Home Falls can cause injuries. They can happen to people of all ages. There are many things you can do to make your home safe and to help prevent falls. What can I do on the outside of my home? Regularly fix the edges of walkways and driveways and fix any cracks. Remove anything that might make you trip as you walk through a door, such as a raised step or threshold. Trim any bushes or trees on the path to your home. Use bright outdoor lighting. Clear any walking paths of anything that might make someone trip, such as rocks or tools. Regularly check to see if handrails are loose or broken. Make sure that both sides of any steps have handrails. Any raised decks and porches should have guardrails on the edges. Have any leaves, snow, or ice cleared regularly. Use sand or salt on walking paths during winter. Clean up any spills in your garage right away. This includes oil or grease spills. What can I do in the bathroom? Use night lights. Install grab bars by the toilet and in the tub and shower. Do not use towel bars as grab bars. Use non-skid mats or decals in the tub or shower. If you need to sit down in the shower, use a plastic, non-slip stool. Keep the floor dry. Clean up any water that spills on the floor as soon as it happens. Remove soap buildup in the tub or shower regularly. Attach bath mats securely with double-sided non-slip rug tape. Do not have throw rugs and other things on the floor that can make you trip. What can I do in the bedroom? Use night lights. Make sure that you have a light by your bed that is easy to reach. Do not use any sheets or blankets that are too big for your bed. They should not hang down onto the  floor. Have a firm chair that has side arms. You can use this for support while you get dressed. Do not have throw rugs and other things on the floor that can make you trip. What can I do in the kitchen? Clean up any spills right away. Avoid walking on wet floors. Keep items that you use a lot in easy-to-reach places. If you need to reach something above you, use a strong step stool that has a grab bar. Keep electrical cords out of the way. Do not use floor polish or wax that makes floors slippery. If you must use wax, use non-skid floor wax. Do not have throw rugs and other things on the floor that can make you trip. What can I do with my stairs? Do not leave any items on the stairs. Make sure that there are handrails on both sides of the stairs and use them. Fix handrails that are broken or loose. Make sure that handrails are as long as the stairways. Check any carpeting to make sure that it is firmly attached to the stairs. Fix any carpet that is loose or worn. Avoid having throw rugs at the top or bottom of the stairs. If you do have throw rugs, attach them to the floor with carpet tape. Make sure that you have a light switch at the top of the  stairs and the bottom of the stairs. If you do not have them, ask someone to add them for you. What else can I do to help prevent falls? Wear shoes that: Do not have high heels. Have rubber bottoms. Are comfortable and fit you well. Are closed at the toe. Do not wear sandals. If you use a stepladder: Make sure that it is fully opened. Do not climb a closed stepladder. Make sure that both sides of the stepladder are locked into place. Ask someone to hold it for you, if possible. Clearly mark and make sure that you can see: Any grab bars or handrails. First and last steps. Where the edge of each step is. Use tools that help you move around (mobility aids) if they are needed. These include: Canes. Walkers. Scooters. Crutches. Turn on the  lights when you go into a dark area. Replace any light bulbs as soon as they burn out. Set up your furniture so you have a clear path. Avoid moving your furniture around. If any of your floors are uneven, fix them. If there are any pets around you, be aware of where they are. Review your medicines with your doctor. Some medicines can make you feel dizzy. This can increase your chance of falling. Ask your doctor what other things that you can do to help prevent falls. This information is not intended to replace advice given to you by your health care provider. Make sure you discuss any questions you have with your health care provider. Document Released: 07/25/2009 Document Revised: 03/05/2016 Document Reviewed: 11/02/2014 Elsevier Interactive Patient Education  2017 [employer].

## 2023-12-27 ENCOUNTER — Encounter: Payer: Self-pay | Admitting: Family Medicine

## 2023-12-27 ENCOUNTER — Ambulatory Visit (HOSPITAL_COMMUNITY)
Admission: RE | Admit: 2023-12-27 | Discharge: 2023-12-27 | Disposition: A | Discharge status: Home / Self Care | Source: Ambulatory Visit | Attending: Family Medicine | Admitting: Family Medicine

## 2023-12-27 ENCOUNTER — Ambulatory Visit (INDEPENDENT_AMBULATORY_CARE_PROVIDER_SITE_OTHER): Admitting: Family Medicine

## 2023-12-27 VITALS — BP 120/62 | HR 68 | Temp 97.7°F | Ht 68.0 in | Wt 145.0 lb

## 2023-12-27 DIAGNOSIS — M549 Dorsalgia, unspecified: Secondary | ICD-10-CM

## 2023-12-27 DIAGNOSIS — R079 Chest pain, unspecified: Secondary | ICD-10-CM

## 2023-12-27 DIAGNOSIS — J439 Emphysema, unspecified: Secondary | ICD-10-CM | POA: Diagnosis not present

## 2023-12-27 DIAGNOSIS — I3481 Nonrheumatic mitral (valve) annulus calcification: Secondary | ICD-10-CM | POA: Diagnosis not present

## 2023-12-27 DIAGNOSIS — E118 Type 2 diabetes mellitus with unspecified complications: Secondary | ICD-10-CM

## 2023-12-27 MED ORDER — SUCRALFATE 1 G PO TABS: 1.0000 g | ORAL_TABLET | Freq: Three times a day (TID) | ORAL | 0 refills | Status: DC

## 2023-12-27 NOTE — Progress Notes (Signed)
 Subjective:    Patient ID: Andrew [name], male    DOB: September 22, 1941, 83 y.o.   MRN: [id number] Lab on 12/14/2023  Component Date Value Ref Range Status   WBC 12/14/2023 6.3  3.8 - 10.8 Thousand/uL Final   RBC 12/14/2023 4.52  4.20 - 5.80 Million/uL Final   Hemoglobin 12/14/2023 13.6  13.2 - 17.1 g/dL Final   HCT 34/74/2595 41.3  38.5 - 50.0 % Final   MCV 12/14/2023 91.4  80.0 - 100.0 fL Final   MCH 12/14/2023 30.1  27.0 - 33.0 pg Final   MCHC 12/14/2023 32.9  32.0 - 36.0 g/dL Final   Comment: For adults, a slight decrease in the calculated MCHC value (in the range of 30 to 32 g/dL) is most likely not clinically significant; however, it should be interpreted with caution in correlation with other red cell parameters and the patient's clinical condition.    RDW 12/14/2023 11.9  11.0 - 15.0 % Final   Platelets 12/14/2023 56 (L)  140 - 400 Thousand/uL Final   Platelet clumps noted on smear-count appears adequate.   MPV 12/14/2023 11.1  7.5 - 12.5 fL Final   Neutro Abs 12/14/2023 4,322  1,500 - 7,800 cells/uL Final   Absolute Lymphocytes 12/14/2023 1,298  850 - 3,900 cells/uL Final   Absolute Monocytes 12/14/2023 491  200 - 950 cells/uL Final   Eosinophils Absolute 12/14/2023 151  15 - 500 cells/uL Final   Basophils Absolute 12/14/2023 38  0 - 200 cells/uL Final   Neutrophils Relative % 12/14/2023 68.6  % Final   Total Lymphocyte 12/14/2023 20.6  % Final   Monocytes Relative 12/14/2023 7.8  % Final   Eosinophils Relative 12/14/2023 2.4  % Final   Basophils Relative 12/14/2023 0.6  % Final   Glucose, Bld 12/14/2023 171 (H)  65 - 99 mg/dL Final   Comment: .            Fasting reference interval . For someone without known diabetes, a glucose value >125 mg/dL indicates that they may have diabetes and this should be confirmed with a follow-up test. .    BUN 12/14/2023 19  7 - 25 mg/dL Final   Creat 63/87/5643 0.74  0.70 - 1.22 mg/dL Final   eGFR 32/95/1884 89  > OR = 60  mL/min/1.34m2 Final   BUN/Creatinine Ratio 12/14/2023 SEE NOTE:  6 - 22 (calc) Final   Comment:    Not Reported: BUN and Creatinine are within    reference range. .    Sodium 12/14/2023 140  135 - 146 mmol/L Final   Potassium 12/14/2023 4.1  3.5 - 5.3 mmol/L Final   Chloride 12/14/2023 104  98 - 110 mmol/L Final   CO2 12/14/2023 26  20 - 32 mmol/L Final   Calcium 12/14/2023 8.9  8.6 - 10.3 mg/dL Final   Total Protein 16/60/6301 5.9 (L)  6.1 - 8.1 g/dL Final   Albumin 60/07/9322 4.2  3.6 - 5.1 g/dL Final   Globulin 55/73/2202 1.7 (L)  1.9 - 3.7 g/dL (calc) Final   AG Ratio 12/14/2023 2.5  1.0 - 2.5 (calc) Final   Total Bilirubin 12/14/2023 0.6  0.2 - 1.2 mg/dL Final   Alkaline phosphatase (APISO) 12/14/2023 54  35 - 144 U/L Final   AST 12/14/2023 12  10 - 35 U/L Final   ALT 12/14/2023 11  9 - 46 U/L Final   Cholesterol 12/14/2023 100  <200 mg/dL Final   HDL 54/27/0623 47  >  OR = 40 mg/dL Final   Triglycerides 40/98/1191 56  <150 mg/dL Final   LDL Cholesterol (Calc) 12/14/2023 39  mg/dL (calc) Final   Comment: Reference range: <100 . Desirable range <100 mg/dL for primary prevention;   <70 mg/dL for patients with CHD or diabetic patients  with > or = 2 CHD risk factors. [name] LDL-C is now calculated using the Martin-Hopkins  calculation, which is a validated novel method providing  better accuracy than the Friedewald equation in the  estimation of LDL-C.  Horald Pollen et al. Lenox Ahr. 4782;956(21): 2061-2068  (http://education.[facility].com/faq/FAQ164)    Total CHOL/HDL Ratio 12/14/2023 2.1  <3.0 (calc) Final   Non-HDL Cholesterol (Calc) 12/14/2023 53  <130 mg/dL (calc) Final   Comment: For patients with diabetes plus 1 major ASCVD risk  factor, treating to a non-HDL-C goal of <100 mg/dL  (LDL-C of <86 mg/dL) is considered a therapeutic  option.    Hgb A1c MFr Bld 12/14/2023 7.4 (H)  <5.7 % of total Hgb Final   Comment: For someone without known diabetes, a hemoglobin A1c value  of 6.5% or greater indicates that they may have  diabetes and this should be confirmed with a follow-up  test. . For someone with known diabetes, a value <7% indicates  that their diabetes is well controlled and a value  greater than or equal to 7% indicates suboptimal  control. A1c targets should be individualized based on  duration of diabetes, age, comorbid conditions, and  other considerations. . Currently, no consensus exists regarding use of hemoglobin A1c for diagnosis of diabetes for children. .    Mean Plasma Glucose 12/14/2023 166  mg/dL Final   eAG (mmol/L) 57/84/6962 9.2  mmol/L Final   TSH 12/14/2023 2.59  0.40 - 4.50 mIU/L Final   Creatinine, Urine 12/14/2023 51  20 - 320 mg/dL Final   Protein/Creat Ratio 12/14/2023 98  25 - 148 mg/g creat Final   Protein/Creatinine Ratio 12/14/2023 0.098  0.025 - 0.148 mg/mg creat Final   Total Protein, Urine 12/14/2023 5  5 - 25 mg/dL Final   PSA 95/28/4132 0.24  < OR = 4.00 ng/mL Final   Comment: The total PSA value from this assay system is  standardized against the WHO standard. The test  result will be approximately 20% lower when compared  to the equimolar-standardized total PSA (Beckman  Coulter). Comparison of serial PSA results should be  interpreted with this fact in mind. . This test was performed using the Siemens  chemiluminescent method. Values obtained from  different assay methods cannot be used interchangeably. PSA levels, regardless of value, should not be interpreted as absolute evidence of the presence or absence of disease.    Patient is here today to discuss his lab work.  Labs are excellent except for his low platelet count however there is clumping on the smear and his platelet count is normal otherwise.  Therefore this is a false positive.  His hemoglobin A1c is mildly elevated at 7.4 however he has chronic diarrhea and cannot tolerate metformin.  Due to his severe weight loss and deconditioning he is a  poor candidate for Ozempic or Mounjaro.  He lost weight taking Jardiance.  Therefore his best option and safest option would be Januvia.  At the present time he elects not to take any additional medication for his diabetes.  His biggest concern is pain.  He reports pain just to the left of his thoracic spine and just medial to the scapula.  The pain  radiates into his chest almost on a daily basis.  He states that is constant.  There is no relationship to activity.  It does not hurt worse with movement or physical exertion.  It does not improve with rest.  He states that he has been having the pain for months.  He saw his cardiologist in September and they performed a stress test that showed no evidence of ischemia.  Patient did have an endoscopy last year due to weight loss.  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  He is currently on Prevacid but he states that he does not experience any acid in his chest.  Instead it is a deep aching pain that radiates through from his spine into his chest.  He denies any tearing or chronic pain herniating into his jaw.  He has normal pulses in both arms.  I do not suspect an aortic dissection.  The pain has been going on now for several months.  He also complains of pain in his right bicep.  He tore his right bicep several years ago.  He elected not to have surgery at that time.  Now he states that the pain in his right bicep keeps him awake at night due to discomfort and pain..  We discussed a referral back to orthopedics versus physical therapy but he declines this at the present time.  Past Medical History:  Diagnosis Date   Allergy    grass etc.   Barrett's esophagus    BPH (benign prostatic hyperplasia)    Chest pain, atypical    Chronic kidney disease    kidney stones   Coronary artery disease    Diabetes mellitus without complication (HCC)    GERD (gastroesophageal reflux disease)    Barrett's   Hernia     Hyperlipidemia    Hypertension    Myocardial infarction (HCC)    Thrombocytopenia (HCC)    Ulcer 1960   Past Surgical History:  Procedure Laterality Date   CARDIAC CATHETERIZATION  02/14/2007   MITRAL REGURGITATION. LV NORMAL   CHOLECYSTECTOMY     COLONOSCOPY     CORONARY ARTERY BYPASS GRAFT     twice  2 by passes each time   INGUINAL HERNIA REPAIR     bilateral done twice   LITHOTRIPSY     SKIN CANCER EXCISION     UPPER GASTROINTESTINAL ENDOSCOPY     Current Outpatient Medications on File Prior to Visit  Medication Sig Dispense Refill   ACCU-CHEK GUIDE test strip TEST BLOOD SUGAR ONE TIME DAILY AS DIRECTED 100 strip 3   Accu-Chek Softclix Lancets lancets TEST BLOOD SUGAR ONE TIME DAILY 100 each 3   Alcohol Swabs (DROPSAFE ALCOHOL PREP) 70 % PADS USE TOPICALLY ONE TIME DAILY 100 each 3   AMBULATORY NON FORMULARY MEDICATION Take 1 capsule by mouth in the morning, at noon, and at bedtime. Heather's Tummy Tamers IBS, Peppermint oil with ginger and fennel     amLODipine (NORVASC) 5 MG tablet Take 1 tablet (5 mg total) by mouth daily. 10 tablet 0   aspirin 81 MG tablet Take 81 mg by mouth daily.     augmented betamethasone dipropionate (DIPROLENE-AF) 0.05 % cream Apply topically.     Blood Glucose Monitoring Suppl (ACCU-CHEK GUIDE ME) w/Device KIT Use as instructed to monitor FSBS 1x daily. Dx: E11.9 1 kit 1   cetirizine (ZYRTEC) 10 MG tablet Take 1 tablet (10 mg total) by  mouth daily. 30 tablet 0   Cholecalciferol (VITAMIN D) 2000 units CAPS Take 1 capsule by mouth daily.     cholestyramine (QUESTRAN) 4 g packet Take 1 packet (4 g total) by mouth 2 (two) times daily. 60 each 2   clobetasol cream (TEMOVATE) 0.05 % Apply 1 Application topically 2 (two) times daily.     finasteride (PROSCAR) 5 MG tablet TAKE 1 TABLET(5 MG) BY MOUTH DAILY. 90 tablet 3   lansoprazole (PREVACID) 30 MG capsule TAKE 1 CAPSULE EVERY DAY 90 capsule 3   metoprolol succinate (TOPROL-XL) 25 MG 24 hr tablet TAKE  1/2 TABLET EVERY DAY 45 tablet 3   Multiple Vitamin (MULTIVITAMIN) tablet Take 1 tablet by mouth daily.     nitroGLYCERIN (NITROSTAT) 0.4 MG SL tablet Dissolve 1 tablet under the tongue every 5 minutes as needed for chest pain. Max of 3 doses, then 911. 75 tablet 3   olopatadine (PATADAY) 0.1 % ophthalmic solution Place 1 drop into both eyes 2 (two) times daily. 5 mL 12   Omega-3 Fatty Acids (FISH OIL) 1000 MG CPDR Take 1,000 mg by mouth every morning.     simvastatin (ZOCOR) 10 MG tablet TAKE 1 TABLET AT BEDTIME 90 tablet 3   tamsulosin (FLOMAX) 0.4 MG CAPS capsule TAKE 1 CAPSULE EVERY DAY 90 capsule 3   No current facility-administered medications on file prior to visit.   Allergies  Allergen Reactions   Imdur [Isosorbide Mononitrate]     Unknown, per pt    Meloxicam Swelling    Facial swelling    Social History   Socioeconomic History   Marital status: Married    Spouse name: Mary   Number of children: 2   Years of education: Not on file   Highest education level: Not on file  Occupational History   Occupation: Retired  Tobacco Use   Smoking status: Never   Smokeless tobacco: Never  Vaping Use   Vaping status: Never Used  Substance and Sexual Activity   Alcohol use: No   Drug use: No   Sexual activity: Not Currently  Other Topics Concern   Not on file  Social History Narrative   Married in 1961.   6 grandchildren   Social Drivers of Corporate investment banker Strain: Low Risk  (12/16/2023)   Overall Financial Resource Strain (CARDIA)    Difficulty of Paying Living Expenses: Not hard at all  Food Insecurity: No Food Insecurity (12/16/2023)   Hunger Vital Sign    Worried About Running Out of Food in the Last Year: Never true    Ran Out of Food in the Last Year: Never true  Transportation Needs: No Transportation Needs (12/16/2023)   PRAPARE - Administrator, Civil Service (Medical): No    Lack of Transportation (Non-Medical): No  Physical Activity:  Inactive (12/16/2023)   Exercise Vital Sign    Days of Exercise per Week: 0 days    Minutes of Exercise per Session: 0 min  Stress: No Stress Concern Present (12/16/2023)   [employer] of Occupational Health - Occupational Stress Questionnaire    Feeling of Stress : Not at all  Social Connections: Moderately Integrated (12/16/2023)   Social Connection and Isolation Panel [NHANES]    Frequency of Communication with Friends and Family: More than three times a week    Frequency of Social Gatherings with Friends and Family: Once a week    Attends Religious Services: More than 4 times per year  Active Member of Clubs or Organizations: No    Attends Banker Meetings: Never    Marital Status: Married  Catering manager Violence: Not At Risk (12/16/2023)   Humiliation, Afraid, Rape, and Kick questionnaire    Fear of Current or Ex-Partner: No    Emotionally Abused: No    Physically Abused: No    Sexually Abused: No      Review of Systems  All other systems reviewed and are negative.      Objective:   Physical Exam Vitals reviewed.  Constitutional:      General: He is not in acute distress.    Appearance: Normal appearance. He is not ill-appearing or toxic-appearing.  Cardiovascular:     Rate and Rhythm: Normal rate and regular rhythm.     Heart sounds: Normal heart sounds. No murmur heard.    No friction rub. No gallop.  Pulmonary:     Effort: Pulmonary effort is normal. No respiratory distress.     Breath sounds: Normal breath sounds and air entry. No stridor, decreased air movement or transmitted upper airway sounds. No wheezing, rhonchi or rales.  Abdominal:     General: Bowel sounds are normal.     Palpations: Abdomen is soft.  Musculoskeletal:     Right lower leg: No edema.     Left lower leg: No edema.  Neurological:     Mental Status: He is alert.          Assessment & Plan:   Chest pain, unspecified type - Plan: DG Chest 2 View  Controlled type  2 diabetes mellitus with complication, without long-term current use of insulin (HCC)  Mid back pain Patient had a negative stress test in September.  Therefore I do not feel that the pain starting in his left mid back radiating into his left chest is cardiac.  An aortic dissection would be on the differential diagnosis however the pain has been present for months and is mild and chronic.  Therefore I believe that this would be highly unlikely.  I will get a chest x-ray.  However I suspect the pain is likely more GI related possibly related to the ectopic gastric mucosa or even a hiatal hernia.  Therefore I am recommending adding sucralfate 1 g p.o. 3 times daily and reassessing in 2 weeks to see if the pain is better.  Patient declines physical therapy for the right biceps tear.  He declines Januvia to control the diabetes.  Cholesterol is outstanding.  The remainder of his lab work was excellent.  Patient has pseudothrombocytopenia

## 2023-12-30 ENCOUNTER — Encounter: Payer: Self-pay | Admitting: Cardiovascular Disease

## 2023-12-30 NOTE — Progress Notes (Unsigned)
 Andrew [name] Date of Birth  04-30-1941 [facility] [address]    [address] [place], [place]  [address] [phone]  Fax  [phone]   problem list 1. Coronary artery disease 2. Hyperlipidemia 3.  Previous notes.   Andrew [name] is a 84 year old gentleman with a history of coronary artery disease. He status post coronary artery bypass grafting. He also has a history of dyslipidemia.  He's recently been having some problems with back pain.  These episodes of back pain would typically occur when he is doing his normal household chores. It would typically occur in the middle of his back and radiate up through to the front of his chest and between the shoulder blades.  This was not similar to his previous episodes of angina prior to his bypass grafting.  He's been exercising on a regular basis and has not had these symptoms with exercise.   Sept. 11. 2014:  Andrew [name] is doing ok.  No angina.  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  No   Sept. 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  Raised a garden this years.  No CP. Works out regularly.    Oct. 4, 2016: Doing great.    i reviewed his labs with him.   Everything is stable .  Still raising cattle.  Beef prices are down quite a bit this year.    Oct. 24, 2017:   Doing well Still raising cows.    No CP .    September 29, 2017:  Andrew [name] is seen today.  He has a history of coronary artery disease and hyperlipidemia. No CP  Has some shortness of breath. Has had some lower back pain and abd.  Still raising cows .   Hurt his right arm while using his  tractor   His last heart catheterization was Feb 14, 2007. The left anterior descending artery is occluded.  The saphenous vein graft to the second diagonal artery is a large graft and fills the left anterior descending artery in a retrograde fashion.  There is a 90% stenosis in the mid/distal LAD. The LIMA is atretic and does not supply any significant flow to the  LAD. Left circumflex artery has minor luminal irregularities.  The left circumflex artery is dominant.   May 11, 2018:  Doing well.   Treadmill for about 15 to 20 minutes each day.  No episodes of chest pain.  Has occasional swelling in rignht ankle but this is related to SVG harvest  Recent lipid panel from May, 2019 all look good.  Total cholesterol is 100.  HDL is 36.  The LDL is 50.  The triglyceride [name] is 62. Does not take NTG   Aug. 25, 2020 :  Andrew [name] is seen today for follow up visit Hx of CABG in 1998 and then again in 2008 Doing well.  No cp or dyspnea . Still puts in a garden.   Raises cows.  Limited by a right shoulder injury .   June 04, 2020: Andrew [name] is seen today for a follow-up visit.  He has a history of coronary artery disease and coronary artery bypass grafting x2. No cp,  No dsypnea.   Aug. 19, 2022: Andrew [name] is seen today for follow up of his CAD and CABG. No CP ,   walks on treadmill on occasion   Lipids from Aug. 16, show elevated glucose  Lipids look good   Aug. 22, 2023  Andrew [name] is seen today  for follow up of his CAD / CABG Having some back issues  Walks on the treadmill on occasion .  He has been encouraged to have a colonoscopy.  He has been very stable .   He is at low risk for colonoscipy . May hold ASA for 5-7 days prior to procedure   Sept. 20, 2024 Andrew [name] is seen for follow up of his CAD, CABG Seen with daughter, Elita Quick  Has had some chest pain on occasion   Has been going on for a while  Has some intrascapular pain ( also has other back issues  Does his yard work  Has not been on the treadmill    [facility] from [facility]   Has a tooth abscess  Needs several teeth pulled    He had lipids drawn in June, 2024. Total cholesterol is 104 HDL is 50 LDL is 37 Triglyceride [name] is 88    December 31, 2023 Andrew [name] is seen for follow up of his CAD, CABg    Current Outpatient Medications on File Prior to Visit  Medication Sig Dispense  Refill   ACCU-CHEK GUIDE test strip TEST BLOOD SUGAR ONE TIME DAILY AS DIRECTED 100 strip 3   Accu-Chek Softclix Lancets lancets TEST BLOOD SUGAR ONE TIME DAILY 100 each 3   Alcohol Swabs (DROPSAFE ALCOHOL PREP) 70 % PADS USE TOPICALLY ONE TIME DAILY 100 each 3   AMBULATORY NON FORMULARY MEDICATION Take 1 capsule by mouth in the morning, at noon, and at bedtime. Heather's Tummy Tamers IBS, Peppermint oil with ginger and fennel     amLODipine (NORVASC) 5 MG tablet Take 1 tablet (5 mg total) by mouth daily. 10 tablet 0   aspirin 81 MG tablet Take 81 mg by mouth daily.     augmented betamethasone dipropionate (DIPROLENE-AF) 0.05 % cream Apply topically.     Blood Glucose Monitoring Suppl (ACCU-CHEK GUIDE ME) w/Device KIT Use as instructed to monitor FSBS 1x daily. Dx: E11.9 1 kit 1   cetirizine (ZYRTEC) 10 MG tablet Take 1 tablet (10 mg total) by mouth daily. 30 tablet 0   Cholecalciferol (VITAMIN D) 2000 units CAPS Take 1 capsule by mouth daily.     clobetasol cream (TEMOVATE) 0.05 % Apply 1 Application topically 2 (two) times daily.     finasteride (PROSCAR) 5 MG tablet TAKE 1 TABLET(5 MG) BY MOUTH DAILY. 90 tablet 3   lansoprazole (PREVACID) 30 MG capsule TAKE 1 CAPSULE EVERY DAY 90 capsule 3   metoprolol succinate (TOPROL-XL) 25 MG 24 hr tablet TAKE 1/2 TABLET EVERY DAY 45 tablet 3   Multiple Vitamin (MULTIVITAMIN) tablet Take 1 tablet by mouth daily.     nitroGLYCERIN (NITROSTAT) 0.4 MG SL tablet Dissolve 1 tablet under the tongue every 5 minutes as needed for chest pain. Max of 3 doses, then 911. 75 tablet 3   olopatadine (PATADAY) 0.1 % ophthalmic solution Place 1 drop into both eyes 2 (two) times daily. 5 mL 12   Omega-3 Fatty Acids (FISH OIL) 1000 MG CPDR Take 1,000 mg by mouth every morning.     simvastatin (ZOCOR) 10 MG tablet TAKE 1 TABLET AT BEDTIME 90 tablet 3   sucralfate (CARAFATE) 1 g tablet Take 1 tablet (1 g total) by mouth 3 (three) times daily. 90 tablet 0   tamsulosin  (FLOMAX) 0.4 MG CAPS capsule TAKE 1 CAPSULE EVERY DAY 90 capsule 3   No current facility-administered medications on file prior to visit.    Allergies  Allergen Reactions  Imdur [Isosorbide Mononitrate]     Unknown, per pt    Meloxicam Swelling    Facial swelling     Past Medical History:  Diagnosis Date   Allergy    grass etc.   Barrett's esophagus    BPH (benign prostatic hyperplasia)    Chest pain, atypical    Chronic kidney disease    kidney stones   Coronary artery disease    Diabetes mellitus without complication (HCC)    GERD (gastroesophageal reflux disease)    Barrett's   Hernia    Hyperlipidemia    Hypertension    Myocardial infarction (HCC)    Thrombocytopenia (HCC)    Ulcer 1960    Past Surgical History:  Procedure Laterality Date   CARDIAC CATHETERIZATION  02/14/2007   MITRAL REGURGITATION. LV NORMAL   CHOLECYSTECTOMY     COLONOSCOPY     CORONARY ARTERY BYPASS GRAFT     twice  2 by passes each time   INGUINAL HERNIA REPAIR     bilateral done twice   LITHOTRIPSY     SKIN CANCER EXCISION     UPPER GASTROINTESTINAL ENDOSCOPY      Social History   Tobacco Use  Smoking Status Never  Smokeless Tobacco Never    Social History   Substance and Sexual Activity  Alcohol Use No    Family History  Problem Relation Age of Onset   Stroke Father    Heart attack Mother    Heart attack Maternal Grandmother    Heart attack Maternal Grandfather    Heart disease Sister    Heart disease Brother    Heart disease Sister    Heart disease Brother    Prostate cancer Brother    Colon cancer Neg Hx    Esophageal cancer Neg Hx    Rectal cancer Neg Hx    Stomach cancer Neg Hx     Reviw of Systems:   Noted in current history.   Physical Exam: There were no vitals taken for this visit.  No BP recorded.  {Refresh Note OR Click here to enter BP  :1}***    GEN:  Well nourished, well developed in no acute distress HEENT: Normal NECK: No JVD; No  carotid bruits LYMPHATICS: No lymphadenopathy CARDIAC: RRR ***, no murmurs, rubs, gallops RESPIRATORY:  Clear to auscultation without rales, wheezing or rhonchi  ABDOMEN: Soft, non-tender, non-distended MUSCULOSKELETAL:  No edema; No deformity  SKIN: Warm and dry NEUROLOGIC:  Alert and oriented x 3     ECG:        Assessment / Plan:    1. Coronary artery disease.       2.  Hyperlipidemia:         3. Carotid artery disease:   .     4.  Hyperglycemia:       Kristeen Miss, MD  12/30/2023 8:21 AM    [facility] [address] [place],  [address] [place], [place]  [address] Pager [phone] Phone: [phone]; Fax: [phone]

## 2023-12-31 ENCOUNTER — Ambulatory Visit: Payer: Medicare HMO | Attending: Cardiovascular Disease | Admitting: Cardiovascular Disease

## 2023-12-31 VITALS — BP 122/60 | HR 63 | Ht 69.0 in | Wt 146.0 lb

## 2023-12-31 DIAGNOSIS — I10 Essential (primary) hypertension: Secondary | ICD-10-CM

## 2023-12-31 DIAGNOSIS — I6523 Occlusion and stenosis of bilateral carotid arteries: Secondary | ICD-10-CM | POA: Diagnosis not present

## 2023-12-31 DIAGNOSIS — I2510 Atherosclerotic heart disease of native coronary artery without angina pectoris: Secondary | ICD-10-CM | POA: Diagnosis not present

## 2023-12-31 DIAGNOSIS — E782 Mixed hyperlipidemia: Secondary | ICD-10-CM | POA: Diagnosis not present

## 2023-12-31 MED ORDER — NITROGLYCERIN 0.4 MG SL SUBL: SUBLINGUAL_TABLET | SUBLINGUAL | 3 refills | Status: AC

## 2023-12-31 NOTE — Patient Instructions (Signed)
 Follow-Up: At [facility], you and your health needs are our priority.  As part of our continuing mission to provide you with exceptional heart care, we have created designated Provider Care Teams.  These Care Teams include your primary Cardiologist (physician) and Advanced Practice Providers (APPs -  Physician Assistants and Nurse Practitioners) who all work together to provide you with the care you need, when you need it.  We recommend signing up for the patient portal called "MyChart".  Sign up information is provided on this After Visit Summary.  MyChart is used to connect with patients for Virtual Visits (Telemedicine).  Patients are able to view lab/test results, encounter notes, upcoming appointments, etc.  Non-urgent messages can be sent to your provider as well.   To learn more about what you can do with MyChart, go to [email or web address].    Your next appointment:   1 year(s)  Provider:   Kristeen Miss, MD     Other Instructions   1st Floor: - Lobby - Registration  - Pharmacy  - Lab - Cafe  2nd Floor: - PV Lab - Diagnostic Testing (echo, CT, nuclear med)  3rd Floor: - Vacant  4th Floor: - TCTS (cardiothoracic surgery) - AFib Clinic - Structural Heart Clinic - Vascular Surgery  - Vascular Ultrasound  5th Floor: - HeartCare Cardiology (general and EP) - Clinical Pharmacy for coumadin, hypertension, lipid, weight-loss medications, and med management appointments    Valet parking services will be available as well.

## 2024-01-05 DIAGNOSIS — N403 Nodular prostate with lower urinary tract symptoms: Secondary | ICD-10-CM | POA: Diagnosis not present

## 2024-01-05 DIAGNOSIS — E291 Testicular hypofunction: Secondary | ICD-10-CM | POA: Diagnosis not present

## 2024-01-05 DIAGNOSIS — R351 Nocturia: Secondary | ICD-10-CM | POA: Diagnosis not present

## 2024-01-11 ENCOUNTER — Encounter: Payer: Self-pay | Admitting: Family Medicine

## 2024-01-11 ENCOUNTER — Ambulatory Visit (INDEPENDENT_AMBULATORY_CARE_PROVIDER_SITE_OTHER): Admitting: Family Medicine

## 2024-01-11 VITALS — BP 126/62 | HR 69 | Temp 97.6°F | Ht 69.0 in | Wt 146.6 lb

## 2024-01-11 DIAGNOSIS — R079 Chest pain, unspecified: Secondary | ICD-10-CM | POA: Diagnosis not present

## 2024-01-11 MED ORDER — FAMOTIDINE 40 MG PO TABS
40.0000 mg | ORAL_TABLET | Freq: Every day | ORAL | 11 refills | Status: DC
Start: 2024-01-11 — End: 2024-05-01

## 2024-01-11 NOTE — Progress Notes (Signed)
 Subjective:    Patient ID: Andrew [name], male    DOB: [birth year], 83 y.o.   MRN: [id number]  Patient states that his chest pain worsened after taking sucralfate.  He had an EGD last year that showed a hiatal hernia as well as some mild esophagitis and ectopic gastric mucosa.  I believe his chest pain is likely related to stomach acid.  He denies any angina or shortness of breath.  Chest x-ray was clear.  Lab work was normal. Past Medical History:  Diagnosis Date   Allergy    grass etc.   Barrett's esophagus    BPH (benign prostatic hyperplasia)    Chest pain, atypical    Chronic kidney disease    kidney stones   Coronary artery disease    Diabetes mellitus without complication (HCC)    GERD (gastroesophageal reflux disease)    Barrett's   Hernia    Hyperlipidemia    Hypertension    Myocardial infarction (HCC)    Thrombocytopenia (HCC)    Ulcer 1960   Past Surgical History:  Procedure Laterality Date   CARDIAC CATHETERIZATION  02/14/2007   MITRAL REGURGITATION. LV NORMAL   CHOLECYSTECTOMY     COLONOSCOPY     CORONARY ARTERY BYPASS GRAFT     twice  2 by passes each time   INGUINAL HERNIA REPAIR     bilateral done twice   LITHOTRIPSY     SKIN CANCER EXCISION     UPPER GASTROINTESTINAL ENDOSCOPY     Current Outpatient Medications on File Prior to Visit  Medication Sig Dispense Refill   ACCU-CHEK GUIDE test strip TEST BLOOD SUGAR ONE TIME DAILY AS DIRECTED 100 strip 3   Accu-Chek Softclix Lancets lancets TEST BLOOD SUGAR ONE TIME DAILY 100 each 3   Alcohol Swabs (DROPSAFE ALCOHOL PREP) 70 % PADS USE TOPICALLY ONE TIME DAILY 100 each 3   AMBULATORY NON FORMULARY MEDICATION Take 1 capsule by mouth in the morning, at noon, and at bedtime. Heather's Tummy Tamers IBS, Peppermint oil with ginger and fennel     amLODipine (NORVASC) 5 MG tablet Take 1 tablet (5 mg total) by mouth daily. 10 tablet 0   aspirin 81 MG tablet Take 81 mg by mouth daily.     augmented betamethasone  dipropionate (DIPROLENE-AF) 0.05 % cream Apply topically.     Blood Glucose Monitoring Suppl (ACCU-CHEK GUIDE ME) w/Device KIT Use as instructed to monitor FSBS 1x daily. Dx: E11.9 1 kit 1   cetirizine (ZYRTEC) 10 MG tablet Take 1 tablet (10 mg total) by mouth daily. 30 tablet 0   Cholecalciferol (VITAMIN D) 2000 units CAPS Take 1 capsule by mouth daily.     clobetasol cream (TEMOVATE) 0.05 % Apply 1 Application topically 2 (two) times daily.     finasteride (PROSCAR) 5 MG tablet TAKE 1 TABLET(5 MG) BY MOUTH DAILY. 90 tablet 3   lansoprazole (PREVACID) 30 MG capsule TAKE 1 CAPSULE EVERY DAY 90 capsule 3   metoprolol succinate (TOPROL-XL) 25 MG 24 hr tablet TAKE 1/2 TABLET EVERY DAY 45 tablet 3   Multiple Vitamin (MULTIVITAMIN) tablet Take 1 tablet by mouth daily.     nitroGLYCERIN (NITROSTAT) 0.4 MG SL tablet Dissolve 1 tablet under the tongue every 5 minutes as needed for chest pain. Max of 3 doses, then 911. 75 tablet 3   olopatadine (PATADAY) 0.1 % ophthalmic solution Place 1 drop into both eyes 2 (two) times daily. 5 mL 12   Omega-3 Fatty Acids (FISH  OIL) 1000 MG CPDR Take 1,000 mg by mouth every morning.     simvastatin (ZOCOR) 10 MG tablet TAKE 1 TABLET AT BEDTIME 90 tablet 3   sucralfate (CARAFATE) 1 g tablet Take 1 tablet (1 g total) by mouth 3 (three) times daily. 90 tablet 0   tamsulosin (FLOMAX) 0.4 MG CAPS capsule TAKE 1 CAPSULE EVERY DAY 90 capsule 3   No current facility-administered medications on file prior to visit.   Allergies  Allergen Reactions   Imdur [Isosorbide Mononitrate]     Unknown, per pt    Meloxicam Swelling    Facial swelling    Social History   Socioeconomic History   Marital status: Married    Spouse name: Mary   Number of children: 2   Years of education: Not on file   Highest education level: Not on file  Occupational History   Occupation: Retired  Tobacco Use   Smoking status: Never   Smokeless tobacco: Never  Vaping Use   Vaping status:  Never Used  Substance and Sexual Activity   Alcohol use: No   Drug use: No   Sexual activity: Not Currently  Other Topics Concern   Not on file  Social History Narrative   Married in 1961.   6 grandchildren   Social Drivers of Corporate investment banker Strain: Low Risk  (12/16/2023)   Overall Financial Resource Strain (CARDIA)    Difficulty of Paying Living Expenses: Not hard at all  Food Insecurity: No Food Insecurity (12/16/2023)   Hunger Vital Sign    Worried About Running Out of Food in the Last Year: Never true    Ran Out of Food in the Last Year: Never true  Transportation Needs: No Transportation Needs (12/16/2023)   PRAPARE - Administrator, Civil Service (Medical): No    Lack of Transportation (Non-Medical): No  Physical Activity: Inactive (12/16/2023)   Exercise Vital Sign    Days of Exercise per Week: 0 days    Minutes of Exercise per Session: 0 min  Stress: No Stress Concern Present (12/16/2023)   [employer] of Occupational Health - Occupational Stress Questionnaire    Feeling of Stress : Not at all  Social Connections: Moderately Integrated (12/16/2023)   Social Connection and Isolation Panel [NHANES]    Frequency of Communication with Friends and Family: More than three times a week    Frequency of Social Gatherings with Friends and Family: Once a week    Attends Religious Services: More than 4 times per year    Active Member of [employer] or Organizations: No    Attends Banker Meetings: Never    Marital Status: Married  Catering manager Violence: Not At Risk (12/16/2023)   Humiliation, Afraid, Rape, and Kick questionnaire    Fear of Current or Ex-Partner: No    Emotionally Abused: No    Physically Abused: No    Sexually Abused: No      Review of Systems  All other systems reviewed and are negative.      Objective:   Physical Exam Vitals reviewed.  Constitutional:      General: He is not in acute distress.    Appearance: Normal  appearance. He is not ill-appearing or toxic-appearing.  Cardiovascular:     Rate and Rhythm: Normal rate and regular rhythm.     Heart sounds: Normal heart sounds. No murmur heard.    No friction rub. No gallop.  Pulmonary:     Effort:  Pulmonary effort is normal. No respiratory distress.     Breath sounds: Normal breath sounds and air entry. No stridor, decreased air movement or transmitted upper airway sounds. No wheezing, rhonchi or rales.  Abdominal:     General: Bowel sounds are normal.     Palpations: Abdomen is soft.  Musculoskeletal:     Right lower leg: No edema.     Left lower leg: No edema.  Neurological:     Mental Status: He is alert.          Assessment & Plan:   Chest pain, unspecified type Recommended continuing Prevacid but adding Pepcid 40 mg a day.  Discontinue sucralfate.  Reassess in 4 weeks.  I believe the chest pain is due to his hiatal hernia

## 2024-01-13 ENCOUNTER — Ambulatory Visit: Admitting: Family Medicine

## 2024-01-25 DIAGNOSIS — E119 Type 2 diabetes mellitus without complications: Secondary | ICD-10-CM | POA: Diagnosis not present

## 2024-02-15 ENCOUNTER — Encounter: Payer: Self-pay | Admitting: Family Medicine

## 2024-02-15 ENCOUNTER — Ambulatory Visit (INDEPENDENT_AMBULATORY_CARE_PROVIDER_SITE_OTHER): Admitting: Family Medicine

## 2024-02-15 ENCOUNTER — Ambulatory Visit: Payer: Self-pay

## 2024-02-15 VITALS — BP 120/60 | HR 60 | Temp 98.6°F | Ht 69.0 in | Wt 147.1 lb

## 2024-02-15 DIAGNOSIS — S1086XA Insect bite of other specified part of neck, initial encounter: Secondary | ICD-10-CM | POA: Diagnosis not present

## 2024-02-15 DIAGNOSIS — W57XXXA Bitten or stung by nonvenomous insect and other nonvenomous arthropods, initial encounter: Secondary | ICD-10-CM | POA: Diagnosis not present

## 2024-02-15 MED ORDER — DOXYCYCLINE HYCLATE 100 MG PO TABS: 100.0000 mg | ORAL_TABLET | Freq: Two times a day (BID) | ORAL | 0 refills | Status: AC

## 2024-02-15 NOTE — Progress Notes (Signed)
 Patient Office Visit  Assessment & Plan:  Tick bite of other part of neck, initial encounter -     Doxycycline  Hyclate; Take 1 tablet (100 mg total) by mouth 2 (two) times daily for 10 days.  Dispense: 20 tablet; Refill: 0   Tick was removed on today's exam and it was intact.  Antibiotic sent to the pharmacy.  If worsening symptoms patient will notify us . No follow-ups on file.   Subjective:    Patient ID: Andrew [name], male    DOB: 22-Aug-1941  Age: 83 y.o. MRN: [id number]  Chief Complaint  Patient presents with   Tick Removal    On upper back at neckline. Noticed it last night.     HPI Discussed the use of AI scribe software for clinical note transcription with the patient, who gave verbal consent to proceed.  History of Present Illness   Andrew [name] is an 83 year old male who presents with a headache and recent tick bites.  He has been experiencing a mild throbbing headache without associated symptoms such as fever, chills, photophobia, or vomiting. He has not taken any medication for the headache. He is concerned about the headache due to recent tick bites.  Over the past two weeks, he has found three ticks on his body, with the most recent one being removed today. The tick was embedded and left a red mark, but there is no pus or signs of infection. He recalls a similar experience in 2019 when he was treated for suspected Lyme disease, although blood tests were negative at that time.  His past medical history includes diabetes, which he monitors regularly. His blood sugar levels typically range from 170 to 180 mg/dL at night. He is not aware of any allergies to antibiotics, although he recalls a past allergic reaction to an unspecified medication.  Socially, he remains active, engaging in outdoor activities such as gardening and tending to cows. He mentions wearing a t-shirt to help prevent tick bites, although he still encounters them occasionally.     He has noticed 3  different ticks on him the past 2 weeks.       The ASCVD Risk score (Arnett DK, et al., 2019) failed to calculate for the following reasons:   The 2019 ASCVD risk score is only valid for ages 87 to 93   Risk score cannot be calculated because patient has a medical history suggesting prior/existing ASCVD  Past Medical History:  Diagnosis Date   Allergy    grass etc.   Barrett's esophagus    BPH (benign prostatic hyperplasia)    Chest pain, atypical    Chronic kidney disease    kidney stones   Coronary artery disease    Diabetes mellitus without complication (HCC)    GERD (gastroesophageal reflux disease)    Barrett's   Hernia    Hyperlipidemia    Hypertension    Myocardial infarction (HCC)    Thrombocytopenia (HCC)    Ulcer 1960   Past Surgical History:  Procedure Laterality Date   CARDIAC CATHETERIZATION  02/14/2007   MITRAL REGURGITATION. LV NORMAL   CHOLECYSTECTOMY     COLONOSCOPY     CORONARY ARTERY BYPASS GRAFT     twice  2 by passes each time   INGUINAL HERNIA REPAIR     bilateral done twice   LITHOTRIPSY     SKIN CANCER EXCISION     UPPER GASTROINTESTINAL ENDOSCOPY     Social History   Tobacco  Use   Smoking status: Never   Smokeless tobacco: Never  Vaping Use   Vaping status: Never Used  Substance Use Topics   Alcohol use: No   Drug use: No   Family History  Problem Relation Age of Onset   Stroke Father    Heart attack Mother    Heart attack Maternal Grandmother    Heart attack Maternal Grandfather    Heart disease Sister    Heart disease Brother    Heart disease Sister    Heart disease Brother    Prostate cancer Brother    Colon cancer Neg Hx    Esophageal cancer Neg Hx    Rectal cancer Neg Hx    Stomach cancer Neg Hx    Allergies  Allergen Reactions   Imdur [Isosorbide Mononitrate]     Unknown, per pt    Meloxicam Swelling    Facial swelling     ROS    Objective:    BP 120/60   Pulse 60   Temp 98.6 F (37 C)   Ht 5\' 9"   (1.753 m)   Wt 147 lb 2 oz (66.7 kg)   SpO2 99%   BMI 21.73 kg/m  BP Readings from Last 3 Encounters:  02/15/24 120/60  01/11/24 126/62  12/31/23 122/60   Wt Readings from Last 3 Encounters:  02/15/24 147 lb 2 oz (66.7 kg)  01/11/24 146 lb 9.6 oz (66.5 kg)  12/31/23 146 lb (66.2 kg)    Physical Exam Vitals and nursing note reviewed.  Constitutional:      Appearance: Normal appearance.  HENT:     Head: Normocephalic.     Right Ear: Tympanic membrane, ear canal and external ear normal.     Left Ear: Tympanic membrane, ear canal and external ear normal.  Eyes:     Extraocular Movements: Extraocular movements intact.     Pupils: Pupils are equal, round, and reactive to light.     Comments: Pt is not photophobic on exam today.   Cardiovascular:     Rate and Rhythm: Normal rate and regular rhythm.     Heart sounds: Normal heart sounds.  Pulmonary:     Effort: Pulmonary effort is normal.     Breath sounds: Normal breath sounds.  Musculoskeletal:     Right lower leg: No edema.     Left lower leg: No edema.  Skin:    Findings: Erythema and rash present.     Comments: Right upper trapezius near neck area 3 mm erythematous raised area. Does not appear to have remnant of tick (tick fell off of him today during the exam). No streaking noted, no opening, no purulence.   Neurological:     General: No focal deficit present.     Mental Status: He is alert and oriented to person, place, and time.  Psychiatric:        Mood and Affect: Mood normal.        Behavior: Behavior normal.      No results found for any visits on 02/15/24.

## 2024-02-15 NOTE — Telephone Encounter (Signed)
  Chief Complaint: tick removal Symptoms: tick about the size of apple seed to back of neck; headache Frequency: unsure duration, present now Pertinent Negatives: Patient denies N/A. Disposition: [] ED /[] Urgent Care (no appt availability in office) / [x] Appointment(In office/virtual)/ []  [place] [facility]/ [] Home Care/ [] Refused Recommended Disposition /[]  Mobile Bus/ []  Follow-up with PCP Additional Notes: Patient requesting to be seen today for tick removal, he states he had one 2-3 weeks ago in the same spot and he thought it was fully removed. He is unsure if that is the same tick or a new one. He states he won't let someone at work remove it for him and he would prefer to come in and have it removed and looked at as he is also having headaches.  Copied from CRM [id number]. Topic: Clinical - Red Word Triage >> Feb 15, 2024  2:17 PM Baldemar Lev wrote: Red Word that prompted transfer to Nurse Triage: found tick Reason for Disposition  Can't remove live tick (after trying Care Advice)  Answer Assessment - Initial Assessment Questions 1. ATTACHED:  "Is the tick still on the skin?"  (e.g., yes, no, unsure)     Yes.  2. ONSET - TICK STILL ATTACHED:  "How long do you think the tick has been on your skin?" (e.g., hours, days, unsure)  Note:  Is there a recent activity (camping, hiking) where the caller may have been exposed?     He states 2-3 weeks ago he had one in that spot and he was able to get it off, he states it feels like it is in the same spot.  3. ONSET - TICK NOT STILL ATTACHED: "If the tick has been removed, how long do you think the tick was attached before you removed it?" (e.g., 5 hours, 2 days). "When was this?"     N/A 4. LOCATION: "Where is the tick bite located?" (e.g., arm, leg)     Back of neck. 5. TYPE of TICK: "Is it a wood tick or a deer tick?" (e.g., deer tick, wood tick; unsure)     Unsure. 6. SIZE of TICK: "How big is the tick?" (e.g., size of poppy  seed, apple seed, watermelon seed; unsure) Note: Deer ticks can be the size of a poppy seed (nymph) or an apple seed (adult).       Bigger than the last one he had and he states it feels like the size of an apple seed. 7. ENGORGED: "Did the tick look flat or engorged (full, swollen)?" (e.g., flat, engorged; unsure)     Unsure. 8. OTHER SYMPTOMS: "Do you have any other symptoms?" (e.g., fever, rash, redness at bite area, red ring around bite)     Headache. 9. PREGNANCY: "Is there any chance you are pregnant?" "When was your last menstrual period?"     N/A.  Protocols used: Tick Bite-A-AH

## 2024-03-21 DIAGNOSIS — L570 Actinic keratosis: Secondary | ICD-10-CM | POA: Diagnosis not present

## 2024-03-21 DIAGNOSIS — D225 Melanocytic nevi of trunk: Secondary | ICD-10-CM | POA: Diagnosis not present

## 2024-03-21 DIAGNOSIS — Z85828 Personal history of other malignant neoplasm of skin: Secondary | ICD-10-CM | POA: Diagnosis not present

## 2024-03-21 DIAGNOSIS — L720 Epidermal cyst: Secondary | ICD-10-CM | POA: Diagnosis not present

## 2024-03-21 DIAGNOSIS — L821 Other seborrheic keratosis: Secondary | ICD-10-CM | POA: Diagnosis not present

## 2024-03-21 DIAGNOSIS — Z08 Encounter for follow-up examination after completed treatment for malignant neoplasm: Secondary | ICD-10-CM | POA: Diagnosis not present

## 2024-03-21 DIAGNOSIS — L814 Other melanin hyperpigmentation: Secondary | ICD-10-CM | POA: Diagnosis not present

## 2024-03-27 ENCOUNTER — Ambulatory Visit: Payer: Self-pay

## 2024-03-27 ENCOUNTER — Other Ambulatory Visit: Payer: Self-pay | Admitting: Family Medicine

## 2024-03-27 MED ORDER — DIPHENOXYLATE-ATROPINE 2.5-0.025 MG PO TABS: 2.0000 | ORAL_TABLET | Freq: Four times a day (QID) | ORAL | 0 refills | Status: DC | PRN

## 2024-03-27 NOTE — Telephone Encounter (Signed)
  [name]: Action required by provider  Patient was last seen in primary care on 02/15/2024 by Andrew June, MD. Called Nurse Triage reporting Diarrhea. Symptoms began yesterday. Interventions attempted: OTC medications: imodium . Symptoms are: gradually improving.  Triage Disposition: See PCP When Office is Open (Within 3 Days)  Patient/caregiver understands and will follow disposition?:   Patient has been having a diarrhea episode since last night and has had 5 episodes since last night. Please call to triage for appointment or for possible medication to be sent in.   Reason for Disposition  [1] Mild diarrhea (e.g., 1-3 or more stools than normal in past 24 hours) without known cause AND [2] present >  7 days  Answer Assessment - Initial Assessment Questions 1. DIARRHEA SEVERITY: How bad is the diarrhea? How many more stools have you had in the past 24 hours than normal?    - NO DIARRHEA (SCALE 0)   - MILD (SCALE 1-3): Few loose or mushy BMs; increase of 1-3 stools over normal daily number of stools; mild increase in ostomy output.   -  MODERATE (SCALE 4-7): Increase of 4-6 stools daily over normal; moderate increase in ostomy output.   -  SEVERE (SCALE 8-10; OR WORST POSSIBLE): Increase of 7 or more stools daily over normal; moderate increase in ostomy output; incontinence.     Moderate, 5 stools since last night 2. ONSET: When did the diarrhea begin?      Last night 3. BM CONSISTENCY: How loose or watery is the diarrhea?      Has progressed to watery, No blood in stool 4. VOMITING: Are you also vomiting? If Yes, ask: How many times in the past 24 hours?      denies 5. ABDOMEN PAIN: Are you having any abdomen pain? If Yes, ask: What does it feel like? (e.g., crampy, dull, intermittent, constant)      denies 6. ABDOMEN PAIN SEVERITY: If present, ask: How bad is the pain?  (e.g., Scale 1-10; mild, moderate, or severe)   - MILD (1-3): doesn't  interfere with normal activities, abdomen soft and not tender to touch    - MODERATE (4-7): interferes with normal activities or awakens from sleep, abdomen tender to touch    - SEVERE (8-10): excruciating pain, doubled over, unable to do any normal activities       N/a  8. HYDRATION: Any signs of dehydration? (e.g., dry mouth [not just dry lips], too weak to stand, dizziness, new weight loss) When did you last urinate?     Patient still urinating, no other signs of dehydration    Patient would like to know if your office would recommend any other medication besides imodium  for diarrhea.  Protocols used: [facility]

## 2024-03-30 ENCOUNTER — Other Ambulatory Visit: Payer: Self-pay | Admitting: Family Medicine

## 2024-03-30 ENCOUNTER — Telehealth: Payer: Self-pay | Admitting: Family Medicine

## 2024-03-30 ENCOUNTER — Ambulatory Visit (INDEPENDENT_AMBULATORY_CARE_PROVIDER_SITE_OTHER): Admitting: Family Medicine

## 2024-03-30 ENCOUNTER — Encounter: Payer: Self-pay | Admitting: Family Medicine

## 2024-03-30 VITALS — BP 118/64 | HR 63 | Temp 97.7°F | Ht 69.0 in | Wt 142.5 lb

## 2024-03-30 DIAGNOSIS — K529 Noninfective gastroenteritis and colitis, unspecified: Secondary | ICD-10-CM | POA: Diagnosis not present

## 2024-03-30 DIAGNOSIS — E118 Type 2 diabetes mellitus with unspecified complications: Secondary | ICD-10-CM

## 2024-03-30 MED ORDER — SIMVASTATIN 10 MG PO TABS: ORAL_TABLET | ORAL | 3 refills | Status: DC

## 2024-03-30 MED ORDER — SIMVASTATIN 10 MG PO TABS: ORAL_TABLET | ORAL | 3 refills | Status: AC

## 2024-03-30 NOTE — Telephone Encounter (Signed)
 Prescription Request  03/30/2024  LOV: 03/30/2024  What is the name of the medication or equipment?   simvastatin  (ZOCOR ) 10 MG tablet  *patient requesting 1 week supply be sent to [facility]. Also wants normal refill sent to [facility]. Patient only has 1 pill left.   Have you contacted your pharmacy to request a refill? Yes   Which pharmacy would you like this sent to?  [facility] [phone] - [place], [place] - [address] AT [facility] [address]  [place] [address] Phone: [phone] Fax: [phone]      Patient notified that their request is being sent to the clinical staff for review and that they should receive a response within 2 business days.   Please advise patient at [phone].

## 2024-03-30 NOTE — Progress Notes (Signed)
 Subjective:    Patient ID: Andrew [name], male    DOB: 04-Apr-1941, 83 y.o.   MRN: [id number] 04/02/23 Patient is concerned because he continues to lose weight.  He continues to have loose stool.  However the frequency is much better.  Is now 1 or 2 a day.  They are loose and runny per his report.  He also has urgency at times.  His blood sugars are all less than 180 at night.  His fasting blood sugars are well-controlled.  He is on Jardiance  for diabetes which also contribute to [facility] he had to stop form due to diarrhea.  We recently performed a CT scan of the abdomen and pelvis with results below.  I performed a prostate exam today which showed diffuse prostatic enlargement and hypertrophy but no nodularity or tenderness.  He is having some low back pain roughly around the level of L3-L4 however CT scan did show significant degenerative disc disease with anterior listhesis in the lower lumbar spine per my report.  FINDINGS: Lower chest: Moderate elevation of left hemidiaphragm. Dense mitral valve annular calcifications are present. The right atrium is dilated.   Hepatobiliary: No focal liver abnormality is seen. Status post cholecystectomy. No biliary dilatation.   Pancreas: Unremarkable. No pancreatic ductal dilatation or surrounding inflammatory changes.   Spleen: Visualized portions of the spleen are unremarkable.   Adrenals/Urinary Tract: Adrenal glands are normal.   Nonobstructing right renal calculus measuring 3 mm. Kidneys and ureters otherwise unremarkable. Focal calcifications noted in the anterior bladder wall at the site of the urachus measuring 3 and 2 mm. Bladder is otherwise normal.   Stomach/Bowel: No bowel dilatation to indicate ileus or obstruction. Appendix is normal. Mild sigmoid diverticulosis without evidence of acute diverticulitis.   Vascular/Lymphatic: No enlarged abdominal or pelvic lymph nodes. Calcified atheromatous plaque seen throughout the abdominal  aorta and visualized branches.   Reproductive: Markedly enlarged prostate again seen with focal low-density structure within the left side measuring 10 mm. This low-density lesion is unchanged since 07/11/2021 which favors a benign etiology.   Other: No abdominal wall hernia or abnormality. No abdominopelvic ascites.   Musculoskeletal: No acute or significant osseous findings.   IMPRESSION: 1. No acute abnormality of the abdomen or pelvis. 2. Mild sigmoid diverticulosis without evidence of acute diverticulitis. 3. Nonobstructing right renal calculus measuring 3 mm. 4. Markedly enlarged prostate.  At that time, my plan was:  Because of the loose stool I recommended that he add fiber.  The frequency is much better.  However hopefully the fiber will help with the consistency.  Because of the weight loss I want him to stop Jardiance .  As long as his blood sugars are under 200 I can except at this point in his life.  We will get his fasting lab work today.  I will check a PSA however his rectal exam shows BPH.  I suspect the weight loss is time primarily to the diarrhea and so I want to wait for results of the colonoscopy.  04/23/23 Colonoscopy and endoscopy were unremarkable.  Biopsies were unremarkable.  GI found no explanation for his weight loss or chronic diarrhea.  Therefore the most likely explanation for his chronic diarrhea would be irritable bowel syndrome with diarrhea although I have not worked up the patient for malabsorptive diarrhea due to pancreatic insufficiency.  All lab work has been unremarkable. Wt Readings from Last 3 Encounters:  03/30/24 142 lb 8 oz (64.6 kg)  02/15/24 147 lb 2  oz (66.7 kg)  01/11/24 146 lb 9.6 oz (66.5 kg)  At that time, my plan was: At this point, I can find no explanation for the patient's symptoms.  Most likely explanation is irritable bowel syndrome with diarrhea versus exocrine pancreatic insufficiency.  I will check a fecal elastase.   Regardless, may consider empiric treatment with Creon  2 tablets with each meal to see if that helps with weight loss and diarrhea.  I have asked the patient to discontinue Jardiance .  The weight loss began long before the Jardiance  but is certainly not helping.  At this point as long as his sugars are under 200 I would not treat them.  05/11/23 Stool fecal elastase was negative.  Therefore I believe the patient does not have pancreatic insufficiency.  Patient is here today to discuss this further.  I explained to the patient that CT scans of the abdomen and pelvis have been negative, fecal cultures have been negative, fecal elastase has been negative, colonoscopy has been negative.  At this point, I have no explanation other than irritable bowel syndrome.  03/30/24 Patient has a longstanding history of chronic diarrhea as dictated above.  However he states that he was doing quite well until Sunday night.  He ate at a [employer].  shortly thereafter within the next 2 hours, the patient developed profuse watery diarrhea and stomach cramps.  He states that he had approximately 18 episodes of diarrhea over the next 24 hours.  He denies any fever or chills or severe abdominal pain.  He denies any hematuria or hematochezia.  He denies any melena.  Patient called earlier in the week and I gave him a prescription for Lomotil.  He states that the diarrhea has improved dramatically and over the last 24 hours he has not had any diarrhea.  He does have some cramping mild lower abdominal pain Past Medical History:  Diagnosis Date   Allergy    grass etc.   Barrett's esophagus    BPH (benign prostatic hyperplasia)    Chest pain, atypical    Chronic kidney disease    kidney stones   Coronary artery disease    Diabetes mellitus without complication (HCC)    GERD (gastroesophageal reflux disease)    Barrett's   Hernia    Hyperlipidemia    Hypertension    Myocardial infarction (HCC)    Thrombocytopenia  (HCC)    Ulcer 1960   Past Surgical History:  Procedure Laterality Date   CARDIAC CATHETERIZATION  02/14/2007   MITRAL REGURGITATION. LV NORMAL   CHOLECYSTECTOMY     COLONOSCOPY     CORONARY ARTERY BYPASS GRAFT     twice  2 by passes each time   INGUINAL HERNIA REPAIR     bilateral done twice   LITHOTRIPSY     SKIN CANCER EXCISION     UPPER GASTROINTESTINAL ENDOSCOPY     Current Outpatient Medications on File Prior to Visit  Medication Sig Dispense Refill   ACCU-CHEK GUIDE test strip TEST BLOOD SUGAR ONE TIME DAILY AS DIRECTED 100 strip 3   Accu-Chek Softclix Lancets lancets TEST BLOOD SUGAR ONE TIME DAILY 100 each 3   Alcohol Swabs (DROPSAFE ALCOHOL PREP) 70 % PADS USE TOPICALLY ONE TIME DAILY 100 each 3   AMBULATORY NON FORMULARY MEDICATION Take 1 capsule by mouth in the morning, at noon, and at bedtime. Heather's Tummy Tamers IBS, Peppermint oil with ginger and fennel     amLODipine  (NORVASC ) 5 MG tablet Take 1 tablet (  5 mg total) by mouth daily. 10 tablet 0   aspirin 81 MG tablet Take 81 mg by mouth daily.     augmented betamethasone dipropionate (DIPROLENE-AF) 0.05 % cream Apply topically.     Blood Glucose Monitoring Suppl (ACCU-CHEK GUIDE ME) w/Device KIT Use as instructed to monitor FSBS 1x daily. Dx: E11.9 1 kit 1   cetirizine  (ZYRTEC ) 10 MG tablet Take 1 tablet (10 mg total) by mouth daily. 30 tablet 0   Cholecalciferol (VITAMIN D) 2000 units CAPS Take 1 capsule by mouth daily.     clobetasol cream (TEMOVATE) 0.05 % Apply 1 Application topically 2 (two) times daily.     diphenoxylate-atropine (LOMOTIL) 2.5-0.025 MG tablet Take 2 tablets by mouth 4 (four) times daily as needed for diarrhea or loose stools. 30 tablet 0   famotidine  (PEPCID ) 40 MG tablet Take 1 tablet (40 mg total) by mouth daily. 30 tablet 11   finasteride  (PROSCAR ) 5 MG tablet TAKE 1 TABLET(5 MG) BY MOUTH DAILY. 90 tablet 3   lansoprazole  (PREVACID ) 30 MG capsule TAKE 1 CAPSULE EVERY DAY 90 capsule 3    metoprolol  succinate (TOPROL -XL) 25 MG 24 hr tablet TAKE 1/2 TABLET EVERY DAY 45 tablet 3   Multiple Vitamin (MULTIVITAMIN) tablet Take 1 tablet by mouth daily.     nitroGLYCERIN  (NITROSTAT ) 0.4 MG SL tablet Dissolve 1 tablet under the tongue every 5 minutes as needed for chest pain. Max of 3 doses, then 911. 75 tablet 3   olopatadine  (PATADAY ) 0.1 % ophthalmic solution Place 1 drop into both eyes 2 (two) times daily. 5 mL 12   Omega-3 Fatty Acids (FISH OIL) 1000 MG CPDR Take 1,000 mg by mouth every morning.     simvastatin  (ZOCOR ) 10 MG tablet TAKE 1 TABLET AT BEDTIME 90 tablet 3   sucralfate  (CARAFATE ) 1 g tablet Take 1 tablet (1 g total) by mouth 3 (three) times daily. 90 tablet 0   tamsulosin  (FLOMAX ) 0.4 MG CAPS capsule TAKE 1 CAPSULE EVERY DAY 90 capsule 3   No current facility-administered medications on file prior to visit.   Allergies  Allergen Reactions   Imdur [Isosorbide Mononitrate]     Unknown, per pt    Meloxicam Swelling    Facial swelling    Social History   Socioeconomic History   Marital status: Married    Spouse name: Mary   Number of children: 2   Years of education: Not on file   Highest education level: Not on file  Occupational History   Occupation: Retired  Tobacco Use   Smoking status: Never   Smokeless tobacco: Never  Vaping Use   Vaping status: Never Used  Substance and Sexual Activity   Alcohol use: No   Drug use: No   Sexual activity: Not Currently  Other Topics Concern   Not on file  Social History Narrative   Married in 1961.   6 grandchildren   Social Drivers of Corporate investment banker Strain: Low Risk  (12/16/2023)   Overall Financial Resource Strain (CARDIA)    Difficulty of Paying Living Expenses: Not hard at all  Food Insecurity: No Food Insecurity (12/16/2023)   Hunger Vital Sign    Worried About Running Out of Food in the Last Year: Never true    Ran Out of Food in the Last Year: Never true  Transportation Needs: No  Transportation Needs (12/16/2023)   PRAPARE - Administrator, Civil Service (Medical): No    Lack of Transportation (  Non-Medical): No  Physical Activity: Inactive (12/16/2023)   Exercise Vital Sign    Days of Exercise per Week: 0 days    Minutes of Exercise per Session: 0 min  Stress: No Stress Concern Present (12/16/2023)   [employer] of Occupational Health - Occupational Stress Questionnaire    Feeling of Stress : Not at all  Social Connections: Moderately Integrated (12/16/2023)   Social Connection and Isolation Panel    Frequency of Communication with Friends and Family: More than three times a week    Frequency of Social Gatherings with Friends and Family: Once a week    Attends Religious Services: More than 4 times per year    Active Member of [employer] or Organizations: No    Attends Banker Meetings: Never    Marital Status: Married  Catering manager Violence: Not At Risk (12/16/2023)   Humiliation, Afraid, Rape, and Kick questionnaire    Fear of Current or Ex-Partner: No    Emotionally Abused: No    Physically Abused: No    Sexually Abused: No      Review of Systems  All other systems reviewed and are negative.      Objective:   Physical Exam Vitals reviewed.  Constitutional:      General: He is not in acute distress.    Appearance: Normal appearance. He is not ill-appearing or toxic-appearing.   Cardiovascular:     Rate and Rhythm: Normal rate and regular rhythm.     Heart sounds: Normal heart sounds. No murmur heard.    No friction rub. No gallop.  Pulmonary:     Effort: Pulmonary effort is normal. No respiratory distress.     Breath sounds: Normal breath sounds and air entry. No stridor, decreased air movement or transmitted upper airway sounds. No wheezing, rhonchi or rales.  Abdominal:     General: Bowel sounds are normal.     Palpations: Abdomen is soft.   Musculoskeletal:     Right lower leg: No edema.     Left lower leg: No  edema.   Neurological:     Mental Status: He is alert.          Assessment & Plan:  Controlled type 2 diabetes mellitus with complication, without long-term current use of insulin (HCC) - Plan: Hemoglobin A1c, Comprehensive metabolic panel with GFR  Chronic diarrhea Patient has chronic diarrhea which I presume is irritable bowel syndrome diarrhea predominant.  He has had an extensive workup listed above.  However the episode starting Sunday night appears to be more likely food poisoning or a viral gastroenteritis.  Thankfully he is improving.  I recommended that the patient only use Lomotil for severe diarrhea at this point, use fluids, and gradually increase diet as tolerated.  He can use Imodium  for mild diarrhea if necessary while the patient is here we will check a CMP and an A1c to monitor the management of his diabetes.  Past A1c was 7.4 in March.  Patient is not currently on any medication.

## 2024-03-31 ENCOUNTER — Ambulatory Visit: Payer: Self-pay | Admitting: Family Medicine

## 2024-03-31 LAB — COMPREHENSIVE METABOLIC PANEL WITH GFR
AG Ratio: 2.4 (calc) (ref 1.0–2.5)
ALT: 10 U/L (ref 9–46)
AST: 14 U/L (ref 10–35)
Albumin: 4 g/dL (ref 3.6–5.1)
Alkaline phosphatase (APISO): 52 U/L (ref 35–144)
BUN: 19 mg/dL (ref 7–25)
CO2: 23 mmol/L (ref 20–32)
Calcium: 8.5 mg/dL — ABNORMAL LOW (ref 8.6–10.3)
Chloride: 107 mmol/L (ref 98–110)
Creat: 0.71 mg/dL (ref 0.70–1.22)
Globulin: 1.7 g/dL — ABNORMAL LOW (ref 1.9–3.7)
Glucose, Bld: 140 mg/dL — ABNORMAL HIGH (ref 65–99)
Potassium: 4 mmol/L (ref 3.5–5.3)
Sodium: 138 mmol/L (ref 135–146)
Total Bilirubin: 0.7 mg/dL (ref 0.2–1.2)
Total Protein: 5.7 g/dL — ABNORMAL LOW (ref 6.1–8.1)
eGFR: 90 mL/min/{1.73_m2} (ref >=60)

## 2024-03-31 LAB — HEMOGLOBIN A1C
Hgb A1c MFr Bld: 7.2 % — ABNORMAL HIGH (ref <5.7)
Mean Plasma Glucose: 160 mg/dL
eAG (mmol/L): 8.9 mmol/L

## 2024-03-31 NOTE — Progress Notes (Signed)
 Notified pt. Of results by phone.

## 2024-04-02 ENCOUNTER — Other Ambulatory Visit: Payer: Self-pay | Admitting: Family Medicine

## 2024-04-17 ENCOUNTER — Other Ambulatory Visit: Payer: Medicare HMO

## 2024-04-17 ENCOUNTER — Ambulatory Visit: Payer: Medicare HMO | Admitting: Physician Assistant

## 2024-04-25 ENCOUNTER — Other Ambulatory Visit: Payer: Self-pay | Admitting: Physician Assistant

## 2024-04-25 DIAGNOSIS — D696 Thrombocytopenia, unspecified: Secondary | ICD-10-CM

## 2024-04-25 NOTE — Progress Notes (Unsigned)
 [facility] [address], [place] [address]   CLINIC:  Medical Oncology/Hematology  PCP:  Duanne Butler DASEN, MD [address] [place] [address] [phone]   REASON FOR VISIT:  Follow-up for thrombocytopenia  PRIOR THERAPY: None  CURRENT THERAPY: Surveillance  INTERVAL HISTORY:   Andrew [name] 83 y.o. male returns for routine follow-up of thrombocytopenia.  He was last seen by Pleasant Barefoot PA-C on 04/16/2023. At today's visit, he reports feeling ***.   ***No recent hospitalizations, surgeries, or changes in baseline health status.  He has easy bruising at baseline but denies any severe abnormal bruising or petechial rash.***  ***He has not had any bleeding such as epistaxis, hematemesis, hematochezia, melena, hematuria, or gum bleeding.  ***He denies any fever, chills, or night sweats.   *** Weight stable over the past year *** ***He has 75***% energy and 100***% appetite.   ASSESSMENT & PLAN:  1.  Mild to moderate thrombocytopenia - Followed at our clinic for intermittent mild to moderate thrombocytopenia - He has had intermittently low platelet count since 2011, ranging from 59 to normal -CTAP on 07/12/2019 shows normal spleen with no hepatic masses.  Normal enlarged lymph nodes. - Nutritional deficiency work-up and connective tissue disorder work-up was negative. - He denies any B symptoms or infections in the last 6 months.*** - He has easy bruising over the dorsum of the hands due to thinning of the skin.  No bleeding manifestations.*** - Multiple CBCs over the past years have shown low platelets, but with PLATELET CLUMPING noted on smear but COUNT APPEARING ADEQUATE (most recently on 12/14/2023 with platelets 56) - Labs today (***): platelets ***.  Immature platelet fraction ***.  LDH ***. - Differential diagnosis favors pseudothrombocytopenia / platelet clumping versus immune mediated thrombocytopenia. - PLAN: Recommend follow-up in  1 year with repeat CBC.  *** Discharge to PCP ***   PLAN SUMMARY: *** TBD *** >> Labs (CBC/D+ LDH) and OFFICE visit in 1 year     REVIEW OF SYSTEMS: ***  Review of Systems  Constitutional:  Negative for appetite change, chills, diaphoresis, fatigue, fever and unexpected weight change.  HENT:   Positive for trouble swallowing. Negative for lump/mass and nosebleeds.   Eyes:  Negative for eye problems.  Respiratory:  Negative for cough, hemoptysis and shortness of breath.   Cardiovascular:  Negative for chest pain, leg swelling and palpitations.  Gastrointestinal:  Positive for diarrhea. Negative for abdominal pain, blood in stool, constipation, nausea and vomiting.  Genitourinary:  Negative for hematuria.   Skin: Negative.   Neurological:  Positive for headaches. Negative for dizziness and light-headedness.  Hematological:  Does not bruise/bleed easily.  Psychiatric/Behavioral:  Positive for sleep disturbance.      PHYSICAL EXAM:***  ECOG PERFORMANCE STATUS: 1 - Symptomatic but completely ambulatory  There were no vitals filed for this visit.  There were no vitals filed for this visit.  Physical Exam Constitutional:      Appearance: Normal appearance. He is normal weight.  Cardiovascular:     Heart sounds: Normal heart sounds.  Pulmonary:     Breath sounds: Normal breath sounds.  Neurological:     General: No focal deficit present.     Mental Status: Mental status is at baseline.  Psychiatric:        Behavior: Behavior normal. Behavior is cooperative.     PAST MEDICAL/SURGICAL HISTORY:  Past Medical History:  Diagnosis Date   Allergy    grass etc.  Barrett's esophagus    BPH (benign prostatic hyperplasia)    Chest pain, atypical    Chronic kidney disease    kidney stones   Coronary artery disease    Diabetes mellitus without complication (HCC)    GERD (gastroesophageal reflux disease)    Barrett's   Hernia    Hyperlipidemia    Hypertension    Myocardial  infarction (HCC)    Thrombocytopenia (HCC)    Ulcer 1960   Past Surgical History:  Procedure Laterality Date   CARDIAC CATHETERIZATION  02/14/2007   MITRAL REGURGITATION. LV NORMAL   CHOLECYSTECTOMY     COLONOSCOPY     CORONARY ARTERY BYPASS GRAFT     twice  2 by passes each time   INGUINAL HERNIA REPAIR     bilateral done twice   LITHOTRIPSY     SKIN CANCER EXCISION     UPPER GASTROINTESTINAL ENDOSCOPY      SOCIAL HISTORY:  Social History   Socioeconomic History   Marital status: Married    Spouse name: Mary   Number of children: 2   Years of education: Not on file   Highest education level: Not on file  Occupational History   Occupation: Retired  Tobacco Use   Smoking status: Never   Smokeless tobacco: Never  Vaping Use   Vaping status: Never Used  Substance and Sexual Activity   Alcohol use: No   Drug use: No   Sexual activity: Not Currently  Other Topics Concern   Not on file  Social History Narrative   Married in 1961.   6 grandchildren   Social Drivers of Corporate investment banker Strain: Low Risk  (12/16/2023)   Overall Financial Resource Strain (CARDIA)    Difficulty of Paying Living Expenses: Not hard at all  Food Insecurity: No Food Insecurity (12/16/2023)   Hunger Vital Sign    Worried About Running Out of Food in the Last Year: Never true    Ran Out of Food in the Last Year: Never true  Transportation Needs: No Transportation Needs (12/16/2023)   PRAPARE - Administrator, Civil Service (Medical): No    Lack of Transportation (Non-Medical): No  Physical Activity: Inactive (12/16/2023)   Exercise Vital Sign    Days of Exercise per Week: 0 days    Minutes of Exercise per Session: 0 min  Stress: No Stress Concern Present (12/16/2023)   [employer] of Occupational Health - Occupational Stress Questionnaire    Feeling of Stress : Not at all  Social Connections: Moderately Integrated (12/16/2023)   Social Connection and Isolation Panel     Frequency of Communication with Friends and Family: More than three times a week    Frequency of Social Gatherings with Friends and Family: Once a week    Attends Religious Services: More than 4 times per year    Active Member of [employer] or Organizations: No    Attends Banker Meetings: Never    Marital Status: Married  Catering manager Violence: Not At Risk (12/16/2023)   Humiliation, Afraid, Rape, and Kick questionnaire    Fear of Current or Ex-Partner: No    Emotionally Abused: No    Physically Abused: No    Sexually Abused: No    FAMILY HISTORY:  Family History  Problem Relation Age of Onset   Stroke Father    Heart attack Mother    Heart attack Maternal Grandmother    Heart attack Maternal Grandfather    Heart  disease Sister    Heart disease Brother    Heart disease Sister    Heart disease Brother    Prostate cancer Brother    Colon cancer Neg Hx    Esophageal cancer Neg Hx    Rectal cancer Neg Hx    Stomach cancer Neg Hx     CURRENT MEDICATIONS:  Outpatient Encounter Medications as of 04/26/2024  Medication Sig   ACCU-CHEK GUIDE test strip TEST BLOOD SUGAR ONE TIME DAILY AS DIRECTED   Accu-Chek Softclix Lancets lancets TEST BLOOD SUGAR ONE TIME DAILY   Alcohol Swabs (DROPSAFE ALCOHOL PREP) 70 % PADS USE TOPICALLY ONE TIME DAILY   AMBULATORY NON FORMULARY MEDICATION Take 1 capsule by mouth in the morning, at noon, and at bedtime. Heather's Tummy Tamers IBS, Peppermint oil with ginger and fennel   amLODipine  (NORVASC ) 5 MG tablet Take 1 tablet (5 mg total) by mouth daily.   aspirin 81 MG tablet Take 81 mg by mouth daily.   augmented betamethasone dipropionate (DIPROLENE-AF) 0.05 % cream Apply topically.   Blood Glucose Monitoring Suppl (ACCU-CHEK GUIDE ME) w/Device KIT Use as instructed to monitor FSBS 1x daily. Dx: E11.9   cetirizine  (ZYRTEC ) 10 MG tablet Take 1 tablet (10 mg total) by mouth daily.   Cholecalciferol (VITAMIN D) 2000 units CAPS Take 1 capsule  by mouth daily.   clobetasol cream (TEMOVATE) 0.05 % Apply 1 Application topically 2 (two) times daily.   diphenoxylate -atropine  (LOMOTIL ) 2.5-0.025 MG tablet Take 2 tablets by mouth 4 (four) times daily as needed for diarrhea or loose stools.   famotidine  (PEPCID ) 40 MG tablet Take 1 tablet (40 mg total) by mouth daily.   finasteride  (PROSCAR ) 5 MG tablet TAKE 1 TABLET(5 MG) BY MOUTH DAILY.   lansoprazole  (PREVACID ) 30 MG capsule TAKE 1 CAPSULE EVERY DAY   metoprolol  succinate (TOPROL -XL) 25 MG 24 hr tablet TAKE 1/2 TABLET EVERY DAY   Multiple Vitamin (MULTIVITAMIN) tablet Take 1 tablet by mouth daily.   nitroGLYCERIN  (NITROSTAT ) 0.4 MG SL tablet Dissolve 1 tablet under the tongue every 5 minutes as needed for chest pain. Max of 3 doses, then 911.   olopatadine  (PATADAY ) 0.1 % ophthalmic solution Place 1 drop into both eyes 2 (two) times daily.   Omega-3 Fatty Acids (FISH OIL) 1000 MG CPDR Take 1,000 mg by mouth every morning.   simvastatin  (ZOCOR ) 10 MG tablet TAKE 1 TABLET AT BEDTIME   sucralfate  (CARAFATE ) 1 g tablet Take 1 tablet (1 g total) by mouth 3 (three) times daily.   tamsulosin  (FLOMAX ) 0.4 MG CAPS capsule TAKE 1 CAPSULE EVERY DAY   No facility-administered encounter medications on file as of 04/26/2024.    ALLERGIES:  Allergies  Allergen Reactions   Imdur [Isosorbide Mononitrate]     Unknown, per pt    Meloxicam Swelling    Facial swelling     LABORATORY DATA:  I have reviewed the labs as listed.  CBC    Component Value Date/Time   WBC 6.3 12/14/2023 0805   RBC 4.52 12/14/2023 0805   HGB 13.6 12/14/2023 0805   HGB 14.4 05/31/2020 0848   HCT 41.3 12/14/2023 0805   HCT 41.9 05/31/2020 0848   PLT 56 (L) 12/14/2023 0805   PLT CANCELED 05/31/2020 0848   MCV 91.4 12/14/2023 0805   MCV 87 05/31/2020 0848   MCH 30.1 12/14/2023 0805   MCHC 32.9 12/14/2023 0805   RDW 11.9 12/14/2023 0805   RDW 12.4 05/31/2020 0848   LYMPHSABS 1.7 04/16/2023 0855  MONOABS 0.6  04/16/2023 0855   EOSABS 151 12/14/2023 0805   BASOSABS 38 12/14/2023 0805      Latest Ref Rng & Units 03/30/2024   11:12 AM 12/14/2023    8:05 AM 07/29/2023    8:15 AM  CMP  Glucose 65 - 99 mg/dL 859  828  845   BUN 7 - 25 mg/dL 19  19  20    Creatinine 0.70 - 1.22 mg/dL 9.28  9.25  9.24   Sodium 135 - 146 mmol/L 138  140  140   Potassium 3.5 - 5.3 mmol/L 4.0  4.1  4.0   Chloride 98 - 110 mmol/L 107  104  103   CO2 20 - 32 mmol/L 23  26  26    Calcium  8.6 - 10.3 mg/dL 8.5  8.9  9.0   Total Protein 6.1 - 8.1 g/dL 5.7  5.9  6.2   Total Bilirubin 0.2 - 1.2 mg/dL 0.7  0.6  0.7   AST 10 - 35 U/L 14  12  13    ALT 9 - 46 U/L 10  11  14      DIAGNOSTIC IMAGING:  I have independently reviewed the relevant imaging and discussed with the patient.   WRAP UP:  All questions were answered. The patient knows to call the clinic with any problems, questions or concerns.  Medical decision making: Low***  Time spent on visit: I spent 15 minutes counseling the patient face to face. The total time spent in the appointment was 22 minutes and more than 50% was on counseling.  Pleasant CHRISTELLA Barefoot, PA-C  ***

## 2024-04-25 NOTE — Addendum Note (Signed)
 Addended by: LAMON HERTER on: 04/25/2024 12:08 PM   Modules accepted: Orders

## 2024-04-26 ENCOUNTER — Other Ambulatory Visit: Payer: Self-pay | Admitting: Family Medicine

## 2024-04-26 ENCOUNTER — Inpatient Hospital Stay (HOSPITAL_BASED_OUTPATIENT_CLINIC_OR_DEPARTMENT_OTHER): Payer: Medicare HMO | Admitting: Physician Assistant

## 2024-04-26 ENCOUNTER — Inpatient Hospital Stay: Payer: Medicare HMO | Attending: Hematology

## 2024-04-26 VITALS — BP 121/71 | HR 86 | Temp 98.0°F | Resp 18 | Wt 144.8 lb

## 2024-04-26 DIAGNOSIS — D696 Thrombocytopenia, unspecified: Secondary | ICD-10-CM | POA: Insufficient documentation

## 2024-04-26 DIAGNOSIS — R898 Other abnormal findings in specimens from other organs, systems and tissues: Secondary | ICD-10-CM

## 2024-04-26 LAB — CBC WITH DIFFERENTIAL/PLATELET
Abs Immature Granulocytes: 0.01 K/uL (ref 0.00–0.07)
Basophils Absolute: 0 K/uL (ref 0.0–0.1)
Basophils Relative: 1 %
Eosinophils Absolute: 0.1 K/uL (ref 0.0–0.5)
Eosinophils Relative: 2 %
HCT: 42 % (ref 39.0–52.0)
Hemoglobin: 14.1 g/dL (ref 13.0–17.0)
Immature Granulocytes: 0 %
Lymphocytes Relative: 25 %
Lymphs Abs: 1.3 K/uL (ref 0.7–4.0)
MCH: 30.7 pg (ref 26.0–34.0)
MCHC: 33.6 g/dL (ref 30.0–36.0)
MCV: 91.5 fL (ref 80.0–100.0)
Monocytes Absolute: 0.4 K/uL (ref 0.1–1.0)
Monocytes Relative: 8 %
Neutro Abs: 3.3 K/uL (ref 1.7–7.7)
Neutrophils Relative %: 64 %
Platelets: 151 K/uL (ref 150–400)
RBC: 4.59 MIL/uL (ref 4.22–5.81)
RDW: 12.5 % (ref 11.5–15.5)
WBC: 5.1 K/uL (ref 4.0–10.5)
nRBC: 0 % (ref 0.0–0.2)

## 2024-04-26 LAB — LACTATE DEHYDROGENASE: LDH: 106 U/L (ref 98–192)

## 2024-04-26 LAB — IMMATURE PLATELET FRACTION: Immature Platelet Fraction: 3.2 % (ref 1.2–8.6)

## 2024-04-26 NOTE — Patient Instructions (Signed)
 [facility] at [facility] Discharge Instructions  You were seen today by Pleasant Barefoot PA-C for your low platelets.  Your most recent platelet levels were normal. Based on previous lab patterns, it appears that your platelets will sometimes stick together in the lab sample, which makes them appear falsely low, even though they are normal. At this time, we will discharge you to your primary care provider.  Your primary care doctor can continue to check your labs every 6 to 12 months, and can send you back to us  in the future if the need arises.   Thank you for choosing [facility] at [facility] to provide your oncology and hematology care.  To afford each patient quality time with our provider, please arrive at least 15 minutes before your scheduled appointment time.   If you have a lab appointment with the Cancer Center please come in thru the Main Entrance and check in at the main information desk.  You need to re-schedule your appointment should you arrive 10 or more minutes late.  We strive to give you quality time with our providers, and arriving late affects you and other patients whose appointments are after yours.  Also, if you no show three or more times for appointments you may be dismissed from the clinic at the providers discretion.     Again, thank you for choosing [facility].  Our hope is that these requests will decrease the amount of time that you wait before being seen by our physicians.       _____________________________________________________________  Should you have questions after your visit to [facility], please contact our office at [phone] and follow the prompts.  Our office hours are 8:00 a.m. and 4:30 p.m. Monday - Friday.  Please note that voicemails left after 4:00 p.m. may not be returned until the following business day.  We are closed weekends and major holidays.  You do have  access to a nurse 24-7, just call the main number to the clinic [phone] and do not press any options, hold on the line and a nurse will answer the phone.    For prescription refill requests, have your pharmacy contact our office and allow 72 hours.    Due to Covid, you will need to wear a mask upon entering the hospital. If you do not have a mask, a mask will be given to you at the Main Entrance upon arrival. For doctor visits, patients may have 1 support person age 50 or older with them. For treatment visits, patients can not have anyone with them due to social distancing guidelines and our immunocompromised population.

## 2024-05-01 ENCOUNTER — Encounter: Payer: Self-pay | Admitting: Family Medicine

## 2024-05-01 ENCOUNTER — Ambulatory Visit: Admitting: Family Medicine

## 2024-05-01 VITALS — BP 118/72 | HR 89 | Temp 97.6°F | Ht 69.0 in | Wt 145.0 lb

## 2024-05-01 DIAGNOSIS — M5030 Other cervical disc degeneration, unspecified cervical region: Secondary | ICD-10-CM

## 2024-05-01 MED ORDER — PREDNISONE 20 MG PO TABS
ORAL_TABLET | ORAL | 0 refills | Status: DC
Start: 2024-05-01 — End: 2024-07-03

## 2024-05-01 NOTE — Progress Notes (Signed)
 Subjective:    Patient ID: Andrew [name], male    DOB: 13-Apr-1941, 83 y.o.   MRN: [id number] Patient presents with 3 weeks of left-sided neck pain.  The pain is located roughly around the level of C4-C5.  It hurts for him to turn his neck to the left.  He does not have pain turning his neck to the right.  He does not have pain with flexion of the neck.  He can touch his chin to his chest without difficulty.  He denies any numbness or tingling in his left arm.  He denies any weakness in his left arm.  Patient had an x-ray of the cervical spine 2023.  At that time it showed: Diffuse multilevel degenerative change with multilevel disc degeneration, endplate osteophyte formation, and facet hypertrophy. Multilevel bilateral neural foraminal narrowing. Past Medical History:  Diagnosis Date   Allergy    grass etc.   Barrett's esophagus    BPH (benign prostatic hyperplasia)    Chest pain, atypical    Chronic kidney disease    kidney stones   Coronary artery disease    Diabetes mellitus without complication (HCC)    GERD (gastroesophageal reflux disease)    Barrett's   Hernia    Hyperlipidemia    Hypertension    Myocardial infarction (HCC)    Thrombocytopenia (HCC)    Ulcer 1960   Past Surgical History:  Procedure Laterality Date   CARDIAC CATHETERIZATION  02/14/2007   MITRAL REGURGITATION. LV NORMAL   CHOLECYSTECTOMY     COLONOSCOPY     CORONARY ARTERY BYPASS GRAFT     twice  2 by passes each time   INGUINAL HERNIA REPAIR     bilateral done twice   LITHOTRIPSY     SKIN CANCER EXCISION     UPPER GASTROINTESTINAL ENDOSCOPY     Current Outpatient Medications on File Prior to Visit  Medication Sig Dispense Refill   ACCU-CHEK GUIDE TEST test strip TEST BLOOD SUGAR ONE TIME DAILY AS DIRECTED 100 strip 3   Accu-Chek Softclix Lancets lancets TEST BLOOD SUGAR ONE TIME DAILY 100 each 3   Alcohol Swabs (DROPSAFE ALCOHOL PREP) 70 % PADS USE TOPICALLY ONE TIME DAILY 100 each 3    AMBULATORY NON FORMULARY MEDICATION Take 1 capsule by mouth in the morning, at noon, and at bedtime. Heather's Tummy Tamers IBS, Peppermint oil with ginger and fennel     amLODipine  (NORVASC ) 5 MG tablet Take 1 tablet (5 mg total) by mouth daily. 10 tablet 0   aspirin 81 MG tablet Take 81 mg by mouth daily.     augmented betamethasone dipropionate (DIPROLENE-AF) 0.05 % cream Apply topically. (Patient not taking: Reported on 04/26/2024)     Blood Glucose Monitoring Suppl (ACCU-CHEK GUIDE ME) w/Device KIT Use as instructed to monitor FSBS 1x daily. Dx: E11.9 1 kit 1   cetirizine  (ZYRTEC ) 10 MG tablet Take 1 tablet (10 mg total) by mouth daily. (Patient not taking: Reported on 04/26/2024) 30 tablet 0   Cholecalciferol (VITAMIN D) 2000 units CAPS Take 1 capsule by mouth daily.     clobetasol cream (TEMOVATE) 0.05 % Apply 1 Application topically 2 (two) times daily. (Patient not taking: Reported on 04/26/2024)     diphenoxylate -atropine  (LOMOTIL ) 2.5-0.025 MG tablet Take 2 tablets by mouth 4 (four) times daily as needed for diarrhea or loose stools. (Patient not taking: Reported on 04/26/2024) 30 tablet 0   famotidine  (PEPCID ) 40 MG tablet Take 1 tablet (40 mg total) by  mouth daily. (Patient not taking: Reported on 04/26/2024) 30 tablet 11   finasteride  (PROSCAR ) 5 MG tablet TAKE 1 TABLET(5 MG) BY MOUTH DAILY. 90 tablet 3   lansoprazole  (PREVACID ) 30 MG capsule TAKE 1 CAPSULE EVERY DAY 90 capsule 3   metoprolol  succinate (TOPROL -XL) 25 MG 24 hr tablet TAKE 1/2 TABLET EVERY DAY 45 tablet 3   Multiple Vitamin (MULTIVITAMIN) tablet Take 1 tablet by mouth daily.     nitroGLYCERIN  (NITROSTAT ) 0.4 MG SL tablet Dissolve 1 tablet under the tongue every 5 minutes as needed for chest pain. Max of 3 doses, then 911. (Patient not taking: Reported on 04/26/2024) 75 tablet 3   olopatadine  (PATADAY ) 0.1 % ophthalmic solution Place 1 drop into both eyes 2 (two) times daily. 5 mL 12   Omega-3 Fatty Acids (FISH OIL) 1000 MG  CPDR Take 1,000 mg by mouth every morning.     simvastatin  (ZOCOR ) 10 MG tablet TAKE 1 TABLET AT BEDTIME 30 tablet 3   sucralfate  (CARAFATE ) 1 g tablet Take 1 tablet (1 g total) by mouth 3 (three) times daily. 90 tablet 0   tamsulosin  (FLOMAX ) 0.4 MG CAPS capsule TAKE 1 CAPSULE EVERY DAY 90 capsule 3   No current facility-administered medications on file prior to visit.   Allergies  Allergen Reactions   Imdur [Isosorbide Mononitrate]     Unknown, per pt    Meloxicam Swelling    Facial swelling    Social History   Socioeconomic History   Marital status: Married    Spouse name: Mary   Number of children: 2   Years of education: Not on file   Highest education level: Not on file  Occupational History   Occupation: Retired  Tobacco Use   Smoking status: Never   Smokeless tobacco: Never  Vaping Use   Vaping status: Never Used  Substance and Sexual Activity   Alcohol use: No   Drug use: No   Sexual activity: Not Currently  Other Topics Concern   Not on file  Social History Narrative   Married in 1961.   6 grandchildren   Social Drivers of Corporate investment banker Strain: Low Risk  (12/16/2023)   Overall Financial Resource Strain (CARDIA)    Difficulty of Paying Living Expenses: Not hard at all  Food Insecurity: No Food Insecurity (12/16/2023)   Hunger Vital Sign    Worried About Running Out of Food in the Last Year: Never true    Ran Out of Food in the Last Year: Never true  Transportation Needs: No Transportation Needs (12/16/2023)   PRAPARE - Administrator, Civil Service (Medical): No    Lack of Transportation (Non-Medical): No  Physical Activity: Inactive (12/16/2023)   Exercise Vital Sign    Days of Exercise per Week: 0 days    Minutes of Exercise per Session: 0 min  Stress: No Stress Concern Present (12/16/2023)   [employer] of Occupational Health - Occupational Stress Questionnaire    Feeling of Stress : Not at all  Social Connections:  Moderately Integrated (12/16/2023)   Social Connection and Isolation Panel    Frequency of Communication with Friends and Family: More than three times a week    Frequency of Social Gatherings with Friends and Family: Once a week    Attends Religious Services: More than 4 times per year    Active Member of [employer] or Organizations: No    Attends Banker Meetings: Never    Marital Status: Married  Intimate Partner Violence: Not At Risk (12/16/2023)   Humiliation, Afraid, Rape, and Kick questionnaire    Fear of Current or Ex-Partner: No    Emotionally Abused: No    Physically Abused: No    Sexually Abused: No      Review of Systems  All other systems reviewed and are negative.      Objective:   Physical Exam Vitals reviewed.  Constitutional:      General: He is not in acute distress.    Appearance: Normal appearance. He is not ill-appearing or toxic-appearing.  Neck:     Vascular: No carotid bruit.  Cardiovascular:     Rate and Rhythm: Normal rate and regular rhythm.     Heart sounds: Normal heart sounds. No murmur heard.    No friction rub. No gallop.  Pulmonary:     Effort: Pulmonary effort is normal. No respiratory distress.     Breath sounds: Normal breath sounds and air entry. No stridor, decreased air movement or transmitted upper airway sounds. No wheezing, rhonchi or rales.  Abdominal:     General: Bowel sounds are normal.     Palpations: Abdomen is soft.  Musculoskeletal:     Cervical back: Pain with movement and muscular tenderness present. No spinous process tenderness. Decreased range of motion.     Right lower leg: No edema.     Left lower leg: No edema.  Lymphadenopathy:     Cervical: No cervical adenopathy.  Neurological:     Mental Status: He is alert.          Assessment & Plan:  DDD (degenerative disc disease), cervical Patient has cervical degenerative disc disease.  I believe his pain is likely due to this.  Begin prednisone  taper pack  for 1 week and then reassess.

## 2024-05-11 DIAGNOSIS — L821 Other seborrheic keratosis: Secondary | ICD-10-CM | POA: Diagnosis not present

## 2024-05-11 DIAGNOSIS — L814 Other melanin hyperpigmentation: Secondary | ICD-10-CM | POA: Diagnosis not present

## 2024-05-11 DIAGNOSIS — L308 Other specified dermatitis: Secondary | ICD-10-CM | POA: Diagnosis not present

## 2024-06-29 ENCOUNTER — Other Ambulatory Visit

## 2024-06-29 DIAGNOSIS — E118 Type 2 diabetes mellitus with unspecified complications: Secondary | ICD-10-CM

## 2024-06-29 DIAGNOSIS — I2510 Atherosclerotic heart disease of native coronary artery without angina pectoris: Secondary | ICD-10-CM

## 2024-06-29 DIAGNOSIS — D696 Thrombocytopenia, unspecified: Secondary | ICD-10-CM

## 2024-06-29 DIAGNOSIS — R634 Abnormal weight loss: Secondary | ICD-10-CM | POA: Diagnosis not present

## 2024-06-29 DIAGNOSIS — E785 Hyperlipidemia, unspecified: Secondary | ICD-10-CM

## 2024-06-29 DIAGNOSIS — I10 Essential (primary) hypertension: Secondary | ICD-10-CM

## 2024-06-29 DIAGNOSIS — R350 Frequency of micturition: Secondary | ICD-10-CM | POA: Diagnosis not present

## 2024-06-29 DIAGNOSIS — N401 Enlarged prostate with lower urinary tract symptoms: Secondary | ICD-10-CM

## 2024-06-30 ENCOUNTER — Ambulatory Visit: Payer: Self-pay | Admitting: Family Medicine

## 2024-06-30 LAB — CBC WITH DIFFERENTIAL/PLATELET
Absolute Lymphocytes: 1193 {cells}/uL (ref 850–3900)
Absolute Monocytes: 423 {cells}/uL (ref 200–950)
Basophils Absolute: 41 {cells}/uL (ref 0–200)
Basophils Relative: 0.8 %
Eosinophils Absolute: 112 {cells}/uL (ref 15–500)
Eosinophils Relative: 2.2 %
HCT: 42.3 % (ref 38.5–50.0)
Hemoglobin: 13.7 g/dL (ref 13.2–17.1)
MCH: 29.9 pg (ref 27.0–33.0)
MCHC: 32.4 g/dL (ref 32.0–36.0)
MCV: 92.4 fL (ref 80.0–100.0)
MPV: 10.7 fL (ref 7.5–12.5)
Monocytes Relative: 8.3 %
Neutro Abs: 3330 {cells}/uL (ref 1500–7800)
Neutrophils Relative %: 65.3 %
Platelets: 68 Thousand/uL — ABNORMAL LOW (ref 140–400)
RBC: 4.58 Million/uL (ref 4.20–5.80)
RDW: 12.1 % (ref 11.0–15.0)
Total Lymphocyte: 23.4 %
WBC: 5.1 Thousand/uL (ref 3.8–10.8)

## 2024-06-30 LAB — HEMOGLOBIN A1C
Hgb A1c MFr Bld: 7.8 % — ABNORMAL HIGH (ref <5.7)
Mean Plasma Glucose: 177 mg/dL
eAG (mmol/L): 9.8 mmol/L

## 2024-06-30 LAB — COMPLETE METABOLIC PANEL WITHOUT GFR
AG Ratio: 2.6 (calc) — ABNORMAL HIGH (ref 1.0–2.5)
ALT: 19 U/L (ref 9–46)
AST: 18 U/L (ref 10–35)
Albumin: 4.2 g/dL (ref 3.6–5.1)
Alkaline phosphatase (APISO): 52 U/L (ref 35–144)
BUN: 17 mg/dL (ref 7–25)
CO2: 30 mmol/L (ref 20–32)
Calcium: 9 mg/dL (ref 8.6–10.3)
Chloride: 104 mmol/L (ref 98–110)
Creat: 0.77 mg/dL (ref 0.70–1.22)
Globulin: 1.6 g/dL — ABNORMAL LOW (ref 1.9–3.7)
Glucose, Bld: 176 mg/dL — ABNORMAL HIGH (ref 65–99)
Potassium: 4.1 mmol/L (ref 3.5–5.3)
Sodium: 140 mmol/L (ref 135–146)
Total Bilirubin: 0.8 mg/dL (ref 0.2–1.2)
Total Protein: 5.8 g/dL — ABNORMAL LOW (ref 6.1–8.1)

## 2024-06-30 LAB — LIPID PANEL
Cholesterol: 100 mg/dL (ref <200)
HDL: 46 mg/dL (ref >=40)
LDL Cholesterol (Calc): 41 mg/dL
Non-HDL Cholesterol (Calc): 54 mg/dL (ref <130)
Total CHOL/HDL Ratio: 2.2 (calc) (ref <5.0)
Triglycerides: 46 mg/dL (ref <150)

## 2024-06-30 LAB — VITAMIN B12: Vitamin B-12: 907 pg/mL (ref 200–1100)

## 2024-06-30 LAB — MICROALBUMIN / CREATININE URINE RATIO
Creatinine, Urine: 70 mg/dL (ref 20–320)
Microalb Creat Ratio: 6 mg/g{creat} (ref <30)
Microalb, Ur: 0.4 mg/dL

## 2024-07-03 ENCOUNTER — Ambulatory Visit: Admitting: Family Medicine

## 2024-07-03 ENCOUNTER — Encounter: Payer: Self-pay | Admitting: Family Medicine

## 2024-07-03 VITALS — BP 118/62 | HR 84 | Temp 97.8°F | Ht 69.0 in | Wt 145.5 lb

## 2024-07-03 DIAGNOSIS — I2510 Atherosclerotic heart disease of native coronary artery without angina pectoris: Secondary | ICD-10-CM | POA: Diagnosis not present

## 2024-07-03 DIAGNOSIS — E118 Type 2 diabetes mellitus with unspecified complications: Secondary | ICD-10-CM | POA: Diagnosis not present

## 2024-07-03 DIAGNOSIS — Z7984 Long term (current) use of oral hypoglycemic drugs: Secondary | ICD-10-CM

## 2024-07-03 DIAGNOSIS — R634 Abnormal weight loss: Secondary | ICD-10-CM

## 2024-07-03 DIAGNOSIS — Z23 Encounter for immunization: Secondary | ICD-10-CM

## 2024-07-03 MED ORDER — SITAGLIPTIN PHOSPHATE 100 MG PO TABS: 100.0000 mg | ORAL_TABLET | Freq: Every day | ORAL | 3 refills | Status: DC

## 2024-07-03 NOTE — Progress Notes (Signed)
 Subjective:    Patient ID: Andrew [name], male    DOB: December 04, 1940, 83 y.o.   MRN: [id number] 04/02/23 Patient is concerned because he continues to lose weight.  He continues to have loose stool.  However the frequency is much better.  Is now 1 or 2 a day.  They are loose and runny per his report.  He also has urgency at times.  His blood sugars are all less than 180 at night.  His fasting blood sugars are well-controlled.  He is on Jardiance  for diabetes which also contribute to [facility] he had to stop form due to diarrhea.  We recently performed a CT scan of the abdomen and pelvis with results below.  I performed a prostate exam today which showed diffuse prostatic enlargement and hypertrophy but no nodularity or tenderness.  He is having some low back pain roughly around the level of L3-L4 however CT scan did show significant degenerative disc disease with anterior listhesis in the lower lumbar spine per my report.  FINDINGS: Lower chest: Moderate elevation of left hemidiaphragm. Dense mitral valve annular calcifications are present. The right atrium is dilated.   Hepatobiliary: No focal liver abnormality is seen. Status post cholecystectomy. No biliary dilatation.   Pancreas: Unremarkable. No pancreatic ductal dilatation or surrounding inflammatory changes.   Spleen: Visualized portions of the spleen are unremarkable.   Adrenals/Urinary Tract: Adrenal glands are normal.   Nonobstructing right renal calculus measuring 3 mm. Kidneys and ureters otherwise unremarkable. Focal calcifications noted in the anterior bladder wall at the site of the urachus measuring 3 and 2 mm. Bladder is otherwise normal.   Stomach/Bowel: No bowel dilatation to indicate ileus or obstruction. Appendix is normal. Mild sigmoid diverticulosis without evidence of acute diverticulitis.   Vascular/Lymphatic: No enlarged abdominal or pelvic lymph nodes. Calcified atheromatous plaque seen throughout the abdominal  aorta and visualized branches.   Reproductive: Markedly enlarged prostate again seen with focal low-density structure within the left side measuring 10 mm. This low-density lesion is unchanged since 07/11/2021 which favors a benign etiology.   Other: No abdominal wall hernia or abnormality. No abdominopelvic ascites.   Musculoskeletal: No acute or significant osseous findings.   IMPRESSION: 1. No acute abnormality of the abdomen or pelvis. 2. Mild sigmoid diverticulosis without evidence of acute diverticulitis. 3. Nonobstructing right renal calculus measuring 3 mm. 4. Markedly enlarged prostate.  At that time, my plan was:  Because of the loose stool I recommended that he add fiber.  The frequency is much better.  However hopefully the fiber will help with the consistency.  Because of the weight loss I want him to stop Jardiance .  As long as his blood sugars are under 200 I can except at this point in his life.  We will get his fasting lab work today.  I will check a PSA however his rectal exam shows BPH.  I suspect the weight loss is time primarily to the diarrhea and so I want to wait for results of the colonoscopy.  04/23/23 Colonoscopy and endoscopy were unremarkable.  Biopsies were unremarkable.  GI found no explanation for his weight loss or chronic diarrhea.  Therefore the most likely explanation for his chronic diarrhea would be irritable bowel syndrome with diarrhea although I have not worked up the patient for malabsorptive diarrhea due to pancreatic insufficiency.  All lab work has been unremarkable. Wt Readings from Last 3 Encounters:  05/01/24 145 lb (65.8 kg)  04/26/24 144 lb 13.5 oz (65.7  kg)  03/30/24 142 lb 8 oz (64.6 kg)  At that time, my plan was: At this point, I can find no explanation for the patient's symptoms.  Most likely explanation is irritable bowel syndrome with diarrhea versus exocrine pancreatic insufficiency.  I will check a fecal elastase.  Regardless,  may consider empiric treatment with Creon  2 tablets with each meal to see if that helps with weight loss and diarrhea.  I have asked the patient to discontinue Jardiance .  The weight loss began long before the Jardiance  but is certainly not helping.  At this point as long as his sugars are under 200 I would not treat them.  05/11/23 Stool fecal elastase was negative.  Therefore I believe the patient does not have pancreatic insufficiency.  Patient is here today to discuss this further.  I explained to the patient that CT scans of the abdomen and pelvis have been negative, fecal cultures have been negative, fecal elastase has been negative, colonoscopy has been negative.  At this point, I have no explanation other than irritable bowel syndrome.  07/03/24 Lab on 06/29/2024  Component Date Value Ref Range Status   WBC 06/29/2024 5.1  3.8 - 10.8 Thousand/uL Final   RBC 06/29/2024 4.58  4.20 - 5.80 Million/uL Final   Hemoglobin 06/29/2024 13.7  13.2 - 17.1 g/dL Final   HCT 90/81/7974 42.3  38.5 - 50.0 % Final   MCV 06/29/2024 92.4  80.0 - 100.0 fL Final   MCH 06/29/2024 29.9  27.0 - 33.0 pg Final   MCHC 06/29/2024 32.4  32.0 - 36.0 g/dL Final   Comment: For adults, a slight decrease in the calculated MCHC value (in the range of 30 to 32 g/dL) is most likely not clinically significant; however, it should be interpreted with caution in correlation with other red cell parameters and the patient's clinical condition.    RDW 06/29/2024 12.1  11.0 - 15.0 % Final   Platelets 06/29/2024 68 (L)  140 - 400 Thousand/uL Final   Platelet clumps noted on smear-count appears adequate.   MPV 06/29/2024 10.7  7.5 - 12.5 fL Final   Neutro Abs 06/29/2024 3,330  1,500 - 7,800 cells/uL Final   Absolute Lymphocytes 06/29/2024 1,193  850 - 3,900 cells/uL Final   Absolute Monocytes 06/29/2024 423  200 - 950 cells/uL Final   Eosinophils Absolute 06/29/2024 112  15 - 500 cells/uL Final   Basophils Absolute 06/29/2024  41  0 - 200 cells/uL Final   Neutrophils Relative % 06/29/2024 65.3  % Final   Total Lymphocyte 06/29/2024 23.4  % Final   Monocytes Relative 06/29/2024 8.3  % Final   Eosinophils Relative 06/29/2024 2.2  % Final   Basophils Relative 06/29/2024 0.8  % Final   Glucose, Bld 06/29/2024 176 (H)  65 - 99 mg/dL Final   Comment: .            Fasting reference interval . For someone without known diabetes, a glucose value >125 mg/dL indicates that they may have diabetes and this should be confirmed with a follow-up test. .    BUN 06/29/2024 17  7 - 25 mg/dL Final   Creat 90/81/7974 0.77  0.70 - 1.22 mg/dL Final   BUN/Creatinine Ratio 06/29/2024 SEE NOTE:  6 - 22 (calc) Final   Comment:    Not Reported: BUN and Creatinine are within    reference range. .    Sodium 06/29/2024 140  135 - 146 mmol/L Final   Potassium 06/29/2024 4.1  3.5 - 5.3 mmol/L Final   Chloride 06/29/2024 104  98 - 110 mmol/L Final   CO2 06/29/2024 30  20 - 32 mmol/L Final   Calcium  06/29/2024 9.0  8.6 - 10.3 mg/dL Final   Total Protein 90/81/7974 5.8 (L)  6.1 - 8.1 g/dL Final   Albumin 90/81/7974 4.2  3.6 - 5.1 g/dL Final   Globulin 90/81/7974 1.6 (L)  1.9 - 3.7 g/dL (calc) Final   AG Ratio 06/29/2024 2.6 (H)  1.0 - 2.5 (calc) Final   Total Bilirubin 06/29/2024 0.8  0.2 - 1.2 mg/dL Final   Alkaline phosphatase (APISO) 06/29/2024 52  35 - 144 U/L Final   AST 06/29/2024 18  10 - 35 U/L Final   ALT 06/29/2024 19  9 - 46 U/L Final   Hgb A1c MFr Bld 06/29/2024 7.8 (H)  <5.7 % Final   Comment: For someone without known diabetes, a hemoglobin A1c value of 6.5% or greater indicates that they may have  diabetes and this should be confirmed with a follow-up  test. . For someone with known diabetes, a value <7% indicates  that their diabetes is well controlled and a value  greater than or equal to 7% indicates suboptimal  control. A1c targets should be individualized based on  duration of diabetes, age, comorbid  conditions, and  other considerations. . Currently, no consensus exists regarding use of hemoglobin A1c for diagnosis of diabetes for children. .    Mean Plasma Glucose 06/29/2024 177  mg/dL Final   eAG (mmol/L) 90/81/7974 9.8  mmol/L Final   Cholesterol 06/29/2024 100  <200 mg/dL Final   HDL 90/81/7974 46  > OR = 40 mg/dL Final   Triglycerides 90/81/7974 46  <150 mg/dL Final   LDL Cholesterol (Calc) 06/29/2024 41  mg/dL (calc) Final   Comment: Reference range: <100 . Desirable range <100 mg/dL for primary prevention;   <70 mg/dL for patients with CHD or diabetic patients  with > or = 2 CHD risk factors. SABRA LDL-C is now calculated using the Martin-Hopkins  calculation, which is a validated novel method providing  better accuracy than the Friedewald equation in the  estimation of LDL-C.  Gladis APPLETHWAITE et al. SANDREA. 7986;689(80): 2061-2068  (http://education.[facility].com/faq/FAQ164)    Total CHOL/HDL Ratio 06/29/2024 2.2  <4.9 (calc) Final   Non-HDL Cholesterol (Calc) 06/29/2024 54  <130 mg/dL (calc) Final   Comment: For patients with diabetes plus 1 major ASCVD risk  factor, treating to a non-HDL-C goal of <100 mg/dL  (LDL-C of <29 mg/dL) is considered a therapeutic  option.    Creatinine, Urine 06/29/2024 70  20 - 320 mg/dL Final   Microalb, Ur 90/81/7974 0.4  mg/dL Final   Comment: Reference Range Not established    Microalb Creat Ratio 06/29/2024 6  <30 mg/g creat Final   Comment: . The ADA defines abnormalities in albumin excretion as follows: SABRA Albuminuria Category        Result (mg/g creatinine) . Normal to Mildly increased   <30 Moderately increased         30-299  Severely increased           > OR = 300 . The ADA recommends that at least two of three specimens collected within a 3-6 month period be abnormal before considering a patient to be within a diagnostic category.    Vitamin B-12 06/29/2024 907  200 - 1,100 pg/mL Final   Patient recently has  been concerned about weight loss.  Therefore I  told him we GLP-1 agonist such as Mounjaro, Ozempic, or Zepbound does not feel that this would exacerbate his weight loss.  He also has had chronic diarrhea and so I feel metformin  would be a poor choice for him.  His best options for his diabetes therefore would be either Actos or Jardiance .  Patient states he has tried Jardiance  in the past and this upset his stomach and gave him worsening diarrhea.  Therefore his options are limited to glipizide, Actos, and Januvia .  I am concerned about additional weight loss with Jardiance .  I am also concerned about fluid retention on Actos.  We spent the majority of our visit today discussing these 2 options.  As shown above, the remainder of his lab work is outstanding.  His cholesterol is excellent.  He is due for a flu shot today.  He continues to complain of the left-sided neck pain due to degenerative disc disease. Past Medical History:  Diagnosis Date   Allergy    grass etc.   Barrett's esophagus    BPH (benign prostatic hyperplasia)    Chest pain, atypical    Chronic kidney disease    kidney stones   Coronary artery disease    Diabetes mellitus without complication (HCC)    GERD (gastroesophageal reflux disease)    Barrett's   Hernia    Hyperlipidemia    Hypertension    Myocardial infarction (HCC)    Thrombocytopenia (HCC)    Ulcer 1960   Past Surgical History:  Procedure Laterality Date   CARDIAC CATHETERIZATION  02/14/2007   MITRAL REGURGITATION. LV NORMAL   CHOLECYSTECTOMY     COLONOSCOPY     CORONARY ARTERY BYPASS GRAFT     twice  2 by passes each time   INGUINAL HERNIA REPAIR     bilateral done twice   LITHOTRIPSY     SKIN CANCER EXCISION     UPPER GASTROINTESTINAL ENDOSCOPY     Current Outpatient Medications on File Prior to Visit  Medication Sig Dispense Refill   ACCU-CHEK GUIDE TEST test strip TEST BLOOD SUGAR ONE TIME DAILY AS DIRECTED 100 strip 3   Accu-Chek Softclix Lancets  lancets TEST BLOOD SUGAR ONE TIME DAILY 100 each 3   Alcohol Swabs (DROPSAFE ALCOHOL PREP) 70 % PADS USE TOPICALLY ONE TIME DAILY 100 each 3   AMBULATORY NON FORMULARY MEDICATION Take 1 capsule by mouth in the morning, at noon, and at bedtime. Heather's Tummy Tamers IBS, Peppermint oil with ginger and fennel     amLODipine  (NORVASC ) 5 MG tablet Take 1 tablet (5 mg total) by mouth daily. 10 tablet 0   aspirin 81 MG tablet Take 81 mg by mouth daily.     augmented betamethasone dipropionate (DIPROLENE-AF) 0.05 % cream Apply topically. (Patient not taking: Reported on 05/01/2024)     Blood Glucose Monitoring Suppl (ACCU-CHEK GUIDE ME) w/Device KIT Use as instructed to monitor FSBS 1x daily. Dx: E11.9 1 kit 1   Cholecalciferol (VITAMIN D) 2000 units CAPS Take 1 capsule by mouth daily.     clobetasol cream (TEMOVATE) 0.05 % Apply 1 Application topically 2 (two) times daily. (Patient not taking: Reported on 05/01/2024)     finasteride  (PROSCAR ) 5 MG tablet TAKE 1 TABLET(5 MG) BY MOUTH DAILY. 90 tablet 3   metoprolol  succinate (TOPROL -XL) 25 MG 24 hr tablet TAKE 1/2 TABLET EVERY DAY 45 tablet 3   Multiple Vitamin (MULTIVITAMIN) tablet Take 1 tablet by mouth daily.     nitroGLYCERIN  (NITROSTAT ) 0.4 MG SL  tablet Dissolve 1 tablet under the tongue every 5 minutes as needed for chest pain. Max of 3 doses, then 911. (Patient taking differently: Place 0.4 mg under the tongue every 5 (five) minutes as needed. Dissolve 1 tablet under the tongue every 5 minutes as needed for chest pain. Max of 3 doses, then 911.) 75 tablet 3   olopatadine  (PATADAY ) 0.1 % ophthalmic solution Place 1 drop into both eyes 2 (two) times daily. (Patient taking differently: Place 1 drop into both eyes 2 (two) times daily. PRN) 5 mL 12   Omega-3 Fatty Acids (FISH OIL) 1000 MG CPDR Take 1,000 mg by mouth every morning.     predniSONE  (DELTASONE ) 20 MG tablet 3 tabs poqday 1-2, 2 tabs poqday 3-4, 1 tab poqday 5-6 12 tablet 0   simvastatin   (ZOCOR ) 10 MG tablet TAKE 1 TABLET AT BEDTIME 30 tablet 3   tamsulosin  (FLOMAX ) 0.4 MG CAPS capsule TAKE 1 CAPSULE EVERY DAY 90 capsule 3   No current facility-administered medications on file prior to visit.   Allergies  Allergen Reactions   Imdur [Isosorbide Mononitrate]     Unknown, per pt    Meloxicam Swelling    Facial swelling    Social History   Socioeconomic History   Marital status: Married    Spouse name: Mary   Number of children: 2   Years of education: Not on file   Highest education level: Not on file  Occupational History   Occupation: Retired  Tobacco Use   Smoking status: Never   Smokeless tobacco: Never  Vaping Use   Vaping status: Never Used  Substance and Sexual Activity   Alcohol use: No   Drug use: No   Sexual activity: Not Currently  Other Topics Concern   Not on file  Social History Narrative   Married in 1961.   6 grandchildren   Social Drivers of Corporate investment banker Strain: Low Risk  (12/16/2023)   Overall Financial Resource Strain (CARDIA)    Difficulty of Paying Living Expenses: Not hard at all  Food Insecurity: No Food Insecurity (12/16/2023)   Hunger Vital Sign    Worried About Running Out of Food in the Last Year: Never true    Ran Out of Food in the Last Year: Never true  Transportation Needs: No Transportation Needs (12/16/2023)   PRAPARE - Administrator, Civil Service (Medical): No    Lack of Transportation (Non-Medical): No  Physical Activity: Inactive (12/16/2023)   Exercise Vital Sign    Days of Exercise per Week: 0 days    Minutes of Exercise per Session: 0 min  Stress: No Stress Concern Present (12/16/2023)   [employer] of Occupational Health - Occupational Stress Questionnaire    Feeling of Stress : Not at all  Social Connections: Moderately Integrated (12/16/2023)   Social Connection and Isolation Panel    Frequency of Communication with Friends and Family: More than three times a week    Frequency  of Social Gatherings with Friends and Family: Once a week    Attends Religious Services: More than 4 times per year    Active Member of [employer] or Organizations: No    Attends Banker Meetings: Never    Marital Status: Married  Catering manager Violence: Not At Risk (12/16/2023)   Humiliation, Afraid, Rape, and Kick questionnaire    Fear of Current or Ex-Partner: No    Emotionally Abused: No    Physically Abused: No  Sexually Abused: No      Review of Systems  All other systems reviewed and are negative.      Objective:   Physical Exam Vitals reviewed.  Constitutional:      General: He is not in acute distress.    Appearance: Normal appearance. He is not ill-appearing or toxic-appearing.  Cardiovascular:     Rate and Rhythm: Normal rate and regular rhythm.     Heart sounds: Normal heart sounds. No murmur heard.    No friction rub. No gallop.  Pulmonary:     Effort: Pulmonary effort is normal. No respiratory distress.     Breath sounds: Normal breath sounds and air entry. No stridor, decreased air movement or transmitted upper airway sounds. No wheezing, rhonchi or rales.  Abdominal:     General: Bowel sounds are normal.     Palpations: Abdomen is soft.  Musculoskeletal:     Right lower leg: No edema.     Left lower leg: No edema.  Neurological:     Mental Status: He is alert.          Assessment & Plan:  Controlled type 2 diabetes mellitus with complication, without long-term current use of insulin (HCC)  Weight loss  Coronary artery disease involving native coronary artery of native heart without angina pectoris   Because of his GI issues, he is unable to tolerate Mounjaro, Ozempic, Jardiance , Farxiga, or metformin .  Options are limited to glipizide, Januvia , or Actos.  Patient elects to try Januvia  100 mg daily and recheck labs in 3 months.  Received his flu shot today.  Blood pressure is excellent.  The remainder of his lab work is good.   Recommended Tylenol 1000 mg twice daily for neck pain

## 2024-07-31 DIAGNOSIS — C44519 Basal cell carcinoma of skin of other part of trunk: Secondary | ICD-10-CM | POA: Diagnosis not present

## 2024-07-31 DIAGNOSIS — L821 Other seborrheic keratosis: Secondary | ICD-10-CM | POA: Diagnosis not present

## 2024-07-31 DIAGNOSIS — D225 Melanocytic nevi of trunk: Secondary | ICD-10-CM | POA: Diagnosis not present

## 2024-07-31 DIAGNOSIS — D492 Neoplasm of unspecified behavior of bone, soft tissue, and skin: Secondary | ICD-10-CM | POA: Diagnosis not present

## 2024-07-31 DIAGNOSIS — L814 Other melanin hyperpigmentation: Secondary | ICD-10-CM | POA: Diagnosis not present

## 2024-07-31 DIAGNOSIS — L905 Scar conditions and fibrosis of skin: Secondary | ICD-10-CM | POA: Diagnosis not present

## 2024-07-31 DIAGNOSIS — L570 Actinic keratosis: Secondary | ICD-10-CM | POA: Diagnosis not present

## 2024-07-31 DIAGNOSIS — C44319 Basal cell carcinoma of skin of other parts of face: Secondary | ICD-10-CM | POA: Diagnosis not present

## 2024-08-03 ENCOUNTER — Telehealth: Payer: Self-pay

## 2024-08-03 ENCOUNTER — Other Ambulatory Visit: Payer: Self-pay

## 2024-08-03 MED ORDER — ACCU-CHEK GUIDE ME W/DEVICE KIT: PACK | 1 refills | Status: AC

## 2024-08-03 NOTE — Telephone Encounter (Signed)
 Prescription Request  08/03/2024  LOV: 07/03/24  What is the name of the medication or equipment? ACCU-CHEK ME GLUCOSE MTR  Have you contacted your pharmacy to request a refill? Yes   Which pharmacy would you like this sent to?  [facility] - [place], [place] - [address] [place] [address] Phone: [phone] Fax: [phone]    Patient notified that their request is being sent to the clinical staff for review and that they should receive a response within 2 business days.   Please advise at [facility] [phone]

## 2024-08-03 NOTE — Telephone Encounter (Signed)
 Pt states that his glucose monitor has been showing an error. I sent in a new glucose meter for him.

## 2024-08-09 ENCOUNTER — Other Ambulatory Visit: Payer: Self-pay | Admitting: Family Medicine

## 2024-08-09 DIAGNOSIS — I10 Essential (primary) hypertension: Secondary | ICD-10-CM

## 2024-08-10 NOTE — Telephone Encounter (Signed)
 Requested medication (s) are due for refill today: yes   Requested medication (s) are on the active medication list: yes   Last refill:  10/25/23 #10 0 refills  Future visit scheduled: yes 09/11/24  Notes to clinic:  do you want to refill Rx for #30 or #90?     Requested Prescriptions  Pending Prescriptions Disp Refills   amLODipine  (NORVASC ) 5 MG tablet [Pharmacy Med Name: AMLODIPINE  BESYLATE 5 MG Oral Tablet] 90 tablet 3    Sig: TAKE 1 TABLET EVERY DAY     Cardiovascular: Calcium  Channel Blockers 2 Passed - 08/10/2024  3:10 PM      Passed - Last BP in normal range    BP Readings from Last 1 Encounters:  07/03/24 118/62         Passed - Last Heart Rate in normal range    Pulse Readings from Last 1 Encounters:  07/03/24 84         Passed - Valid encounter within last 6 months    Recent Outpatient Visits           1 month ago Controlled type 2 diabetes mellitus with complication, without long-term current use of insulin (HCC)   [place] [facility] Pickard, Butler DASEN, MD   3 months ago DDD (degenerative disc disease), cervical   [place] [facility] Duanne Butler DASEN, MD   4 months ago Controlled type 2 diabetes mellitus with complication, without long-term current use of insulin [facility])   [place] [facility] Duanne Butler DASEN, MD   5 months ago Tick bite of other part of neck, initial encounter   [place] [facility] Aletha Bene, MD   7 months ago Chest pain, unspecified type   [place] [facility] Pickard, Butler DASEN, MD

## 2024-08-17 ENCOUNTER — Ambulatory Visit
Admission: RE | Admit: 2024-08-17 | Discharge: 2024-08-17 | Disposition: A | Discharge status: Home / Self Care | Source: Ambulatory Visit | Attending: Family Medicine | Admitting: Family Medicine

## 2024-08-17 ENCOUNTER — Ambulatory Visit (INDEPENDENT_AMBULATORY_CARE_PROVIDER_SITE_OTHER): Admitting: Family Medicine

## 2024-08-17 ENCOUNTER — Encounter: Payer: Self-pay | Admitting: Family Medicine

## 2024-08-17 VITALS — BP 118/62 | HR 85 | Temp 98.3°F | Ht 69.0 in | Wt 148.6 lb

## 2024-08-17 DIAGNOSIS — E118 Type 2 diabetes mellitus with unspecified complications: Secondary | ICD-10-CM

## 2024-08-17 DIAGNOSIS — M542 Cervicalgia: Secondary | ICD-10-CM

## 2024-08-17 DIAGNOSIS — Z7984 Long term (current) use of oral hypoglycemic drugs: Secondary | ICD-10-CM

## 2024-08-17 DIAGNOSIS — M47812 Spondylosis without myelopathy or radiculopathy, cervical region: Secondary | ICD-10-CM | POA: Diagnosis not present

## 2024-08-17 MED ORDER — GLIPIZIDE ER 2.5 MG PO TB24: 2.5000 mg | ORAL_TABLET | Freq: Every day | ORAL | 3 refills | Status: AC

## 2024-08-17 NOTE — Progress Notes (Signed)
 Subjective:    Patient ID: Andrew [name], male    DOB: 06/15/1941, 83 y.o.   MRN: [id number]   Patient reports fasting blood sugars 150-180 in the morning.  He was unable to afford Januvia .  His co-pay was going to be over $2000 for 3 months.  He cannot tolerate metformin  due to diarrhea.  He has lost weight so would want to try to avoid Jardiance  and GLP-1 agonist.  Therefore his options are Actos versus glipizide. Past Medical History:  Diagnosis Date  . Allergy    grass etc.  . Barrett's esophagus   . BPH (benign prostatic hyperplasia)   . Chest pain, atypical   . Chronic kidney disease    kidney stones  . Coronary artery disease   . Diabetes mellitus without complication (HCC)   . GERD (gastroesophageal reflux disease)    Barrett's  . Hernia   . Hyperlipidemia   . Hypertension   . Myocardial infarction (HCC)   . Thrombocytopenia   . Ulcer 1960   Past Surgical History:  Procedure Laterality Date  . CARDIAC CATHETERIZATION  02/14/2007   MITRAL REGURGITATION. LV NORMAL  . CHOLECYSTECTOMY    . COLONOSCOPY    . CORONARY ARTERY BYPASS GRAFT     twice  2 by passes each time  . INGUINAL HERNIA REPAIR     bilateral done twice  . LITHOTRIPSY    . SKIN CANCER EXCISION    . UPPER GASTROINTESTINAL ENDOSCOPY     Current Outpatient Medications on File Prior to Visit  Medication Sig Dispense Refill  . Multiple Vitamin (MULTIVITAMIN) tablet Take 1 tablet by mouth daily.    . nitroGLYCERIN  (NITROSTAT ) 0.4 MG SL tablet Dissolve 1 tablet under the tongue every 5 minutes as needed for chest pain. Max of 3 doses, then 911. (Patient taking differently: Place 0.4 mg under the tongue every 5 (five) minutes as needed for chest pain. Dissolve 1 tablet under the tongue every 5 minutes as needed for chest pain. Max of 3 doses, then 911.) 75 tablet 3  . olopatadine  (PATADAY ) 0.1 % ophthalmic solution Place 1 drop into both eyes 2 (two) times daily. (Patient taking differently: Place 1 drop into  both eyes 2 (two) times daily. PRN) 5 mL 12  . Omega-3 Fatty Acids (FISH OIL) 1000 MG CPDR Take 1,000 mg by mouth every morning.    . simvastatin  (ZOCOR ) 10 MG tablet TAKE 1 TABLET AT BEDTIME 30 tablet 3  . tamsulosin  (FLOMAX ) 0.4 MG CAPS capsule TAKE 1 CAPSULE EVERY DAY 90 capsule 3  . ACCU-CHEK GUIDE TEST test strip TEST BLOOD SUGAR ONE TIME DAILY AS DIRECTED 100 strip 3  . Accu-Chek Softclix Lancets lancets TEST BLOOD SUGAR ONE TIME DAILY 100 each 3  . Alcohol Swabs (DROPSAFE ALCOHOL PREP) 70 % PADS USE TOPICALLY ONE TIME DAILY 100 each 3  . AMBULATORY NON FORMULARY MEDICATION Take 1 capsule by mouth in the morning, at noon, and at bedtime. Heather's Tummy Tamers IBS, Peppermint oil with ginger and fennel    . amLODipine  (NORVASC ) 5 MG tablet TAKE 1 TABLET EVERY DAY 90 tablet 3  . aspirin 81 MG tablet Take 81 mg by mouth daily.    SABRA augmented betamethasone dipropionate (DIPROLENE-AF) 0.05 % cream Apply topically. (Patient not taking: Reported on 08/17/2024)    . Blood Glucose Monitoring Suppl (ACCU-CHEK GUIDE ME) w/Device KIT Use as instructed to monitor FSBS 1x daily. Dx: E11.9 1 kit 1  . Cholecalciferol (VITAMIN D) 2000  units CAPS Take 1 capsule by mouth daily.    . clobetasol cream (TEMOVATE) 0.05 % Apply 1 Application topically 2 (two) times daily. (Patient not taking: Reported on 08/17/2024)    . finasteride  (PROSCAR ) 5 MG tablet TAKE 1 TABLET(5 MG) BY MOUTH DAILY. 90 tablet 3  . lansoprazole  (PREVACID ) 30 MG capsule Take 30 mg by mouth daily.    . metoprolol  succinate (TOPROL -XL) 25 MG 24 hr tablet TAKE 1/2 TABLET EVERY DAY 45 tablet 3  . sitaGLIPtin  (JANUVIA ) 100 MG tablet Take 1 tablet (100 mg total) by mouth daily. (Patient not taking: Reported on 08/17/2024) 90 tablet 3   No current facility-administered medications on file prior to visit.   Allergies  Allergen Reactions  . Imdur [Isosorbide Mononitrate]     Unknown, per pt   . Meloxicam Swelling    Facial swelling    Social  History   Socioeconomic History  . Marital status: Married    Spouse name: Mary  . Number of children: 2  . Years of education: Not on file  . Highest education level: Not on file  Occupational History  . Occupation: Retired  Tobacco Use  . Smoking status: Never  . Smokeless tobacco: Never  Vaping Use  . Vaping status: Never Used  Substance and Sexual Activity  . Alcohol use: No  . Drug use: No  . Sexual activity: Not Currently  Other Topics Concern  . Not on file  Social History Narrative   Married in 1961.   6 grandchildren   Social Drivers of Health   Financial Resource Strain: Low Risk  (12/16/2023)   Overall Financial Resource Strain (CARDIA)   . Difficulty of Paying Living Expenses: Not hard at all  Food Insecurity: No Food Insecurity (12/16/2023)   Hunger Vital Sign   . Worried About Programme Researcher, Broadcasting/film/video in the Last Year: Never true   . Ran Out of Food in the Last Year: Never true  Transportation Needs: No Transportation Needs (12/16/2023)   PRAPARE - Transportation   . Lack of Transportation (Medical): No   . Lack of Transportation (Non-Medical): No  Physical Activity: Inactive (12/16/2023)   Exercise Vital Sign   . Days of Exercise per Week: 0 days   . Minutes of Exercise per Session: 0 min  Stress: No Stress Concern Present (12/16/2023)   [employer] of Occupational Health - Occupational Stress Questionnaire   . Feeling of Stress : Not at all  Social Connections: Moderately Integrated (12/16/2023)   Social Connection and Isolation Panel   . Frequency of Communication with Friends and Family: More than three times a week   . Frequency of Social Gatherings with Friends and Family: Once a week   . Attends Religious Services: More than 4 times per year   . Active Member of Clubs or Organizations: No   . Attends Banker Meetings: Never   . Marital Status: Married  Catering Manager Violence: Not At Risk (12/16/2023)   Humiliation, Afraid, Rape, and Kick  questionnaire   . Fear of Current or Ex-Partner: No   . Emotionally Abused: No   . Physically Abused: No   . Sexually Abused: No      Review of Systems  All other systems reviewed and are negative.      Objective:   Physical Exam Vitals reviewed.  Constitutional:      General: He is not in acute distress.    Appearance: Normal appearance. He is not ill-appearing or toxic-appearing.  Neck:   Cardiovascular:     Rate and Rhythm: Normal rate and regular rhythm.     Heart sounds: Normal heart sounds. No murmur heard.    No friction rub. No gallop.  Pulmonary:     Effort: Pulmonary effort is normal. No respiratory distress.     Breath sounds: Normal breath sounds and air entry. No stridor, decreased air movement or transmitted upper airway sounds. No wheezing, rhonchi or rales.  Abdominal:     General: Bowel sounds are normal.     Palpations: Abdomen is soft.  Musculoskeletal:     Cervical back: No edema, erythema, rigidity or crepitus. Pain with movement present. No spinous process tenderness or muscular tenderness. Decreased range of motion.     Right lower leg: No edema.     Left lower leg: No edema.  Neurological:     Mental Status: He is alert.          Assessment & Plan:  Neck pain - Plan: DG Cervical Spine Complete  Controlled type 2 diabetes mellitus with complication, without long-term current use of insulin (HCC) Add glipizide 2.5 mg p.o. every morning and recheck hemoglobin A1c in 3 months.  Cautioned the patient about hypoglycemia.  Recommended that he avoid skipping meals.  He also complains of stiffness and pain in the left side of his neck.  He reports pain when he turns his head side-to-side.  He denies any night sweats or fevers.  Obtain x-ray of the cervical spine to evaluate further

## 2024-08-24 ENCOUNTER — Ambulatory Visit: Payer: Self-pay | Admitting: Family Medicine

## 2024-08-28 ENCOUNTER — Other Ambulatory Visit: Payer: Self-pay | Admitting: Family Medicine

## 2024-08-29 NOTE — Telephone Encounter (Signed)
 Requested medication (s) are due for refill today - unsure  Requested medication (s) are on the active medication list -yes  Future visit scheduled -yes  Last refill: 06/16/24  Notes to clinic: listed as historical medication- sent for review   Requested Prescriptions  Pending Prescriptions Disp Refills   lansoprazole  (PREVACID ) 30 MG capsule [Pharmacy Med Name: LANSOPRAZOLE  30 MG Oral Capsule Delayed Release] 90 capsule 3    Sig: TAKE 1 CAPSULE EVERY DAY     Gastroenterology: Proton Pump Inhibitors 2 Passed - 08/29/2024  4:29 PM      Passed - ALT in normal range and within 360 days    ALT  Date Value Ref Range Status  06/29/2024 19 9 - 46 U/L Final         Passed - AST in normal range and within 360 days    AST  Date Value Ref Range Status  06/29/2024 18 10 - 35 U/L Final         Passed - Valid encounter within last 12 months    Recent Outpatient Visits           1 week ago Neck pain   [place] [facility] Duanne Butler DASEN, MD   1 month ago Controlled type 2 diabetes mellitus with complication, without long-term current use of insulin (HCC)   [place] [facility] Pickard, Butler DASEN, MD   4 months ago DDD (degenerative disc disease), cervical   [place] [facility] Duanne Butler DASEN, MD   5 months ago Controlled type 2 diabetes mellitus with complication, without long-term current use of insulin [facility])   [place] [facility] Duanne Butler DASEN, MD   6 months ago Tick bite of other part of neck, initial encounter   [place] [facility] Aletha Bene, MD                 Requested Prescriptions  Pending Prescriptions Disp Refills   lansoprazole  (PREVACID ) 30 MG capsule [Pharmacy Med Name: LANSOPRAZOLE  30 MG Oral Capsule Delayed Release] 90 capsule 3    Sig: TAKE 1 CAPSULE EVERY DAY     Gastroenterology: Proton Pump Inhibitors 2 Passed - 08/29/2024  4:29 PM       Passed - ALT in normal range and within 360 days    ALT  Date Value Ref Range Status  06/29/2024 19 9 - 46 U/L Final         Passed - AST in normal range and within 360 days    AST  Date Value Ref Range Status  06/29/2024 18 10 - 35 U/L Final         Passed - Valid encounter within last 12 months    Recent Outpatient Visits           1 week ago Neck pain   [place] [facility] Duanne Butler DASEN, MD   1 month ago Controlled type 2 diabetes mellitus with complication, without long-term current use of insulin [facility])   [place] [facility] Pickard, Butler DASEN, MD   4 months ago DDD (degenerative disc disease), cervical   [place] [facility] Duanne Butler DASEN, MD   5 months ago Controlled type 2 diabetes mellitus with complication, without long-term current use of insulin [facility])    [facility] Duanne Butler DASEN, MD   6 months ago Tick bite of other  part of neck, initial encounter   [place] [facility] Aletha Bene, MD

## 2024-09-04 DIAGNOSIS — I349 Nonrheumatic mitral valve disorder, unspecified: Secondary | ICD-10-CM | POA: Diagnosis not present

## 2024-09-04 DIAGNOSIS — I872 Venous insufficiency (chronic) (peripheral): Secondary | ICD-10-CM | POA: Diagnosis not present

## 2024-09-04 DIAGNOSIS — D696 Thrombocytopenia, unspecified: Secondary | ICD-10-CM | POA: Diagnosis not present

## 2024-09-04 DIAGNOSIS — K219 Gastro-esophageal reflux disease without esophagitis: Secondary | ICD-10-CM | POA: Diagnosis not present

## 2024-09-04 DIAGNOSIS — Z823 Family history of stroke: Secondary | ICD-10-CM | POA: Diagnosis not present

## 2024-09-04 DIAGNOSIS — E1151 Type 2 diabetes mellitus with diabetic peripheral angiopathy without gangrene: Secondary | ICD-10-CM | POA: Diagnosis not present

## 2024-09-04 DIAGNOSIS — J302 Other seasonal allergic rhinitis: Secondary | ICD-10-CM | POA: Diagnosis not present

## 2024-09-04 DIAGNOSIS — N400 Enlarged prostate without lower urinary tract symptoms: Secondary | ICD-10-CM | POA: Diagnosis not present

## 2024-09-04 DIAGNOSIS — E785 Hyperlipidemia, unspecified: Secondary | ICD-10-CM | POA: Diagnosis not present

## 2024-09-04 DIAGNOSIS — Z8249 Family history of ischemic heart disease and other diseases of the circulatory system: Secondary | ICD-10-CM | POA: Diagnosis not present

## 2024-09-04 DIAGNOSIS — I25119 Atherosclerotic heart disease of native coronary artery with unspecified angina pectoris: Secondary | ICD-10-CM | POA: Diagnosis not present

## 2024-09-04 DIAGNOSIS — Z7982 Long term (current) use of aspirin: Secondary | ICD-10-CM | POA: Diagnosis not present

## 2024-09-04 DIAGNOSIS — I10 Essential (primary) hypertension: Secondary | ICD-10-CM | POA: Diagnosis not present

## 2024-09-04 DIAGNOSIS — I252 Old myocardial infarction: Secondary | ICD-10-CM | POA: Diagnosis not present

## 2024-09-04 DIAGNOSIS — Z803 Family history of malignant neoplasm of breast: Secondary | ICD-10-CM | POA: Diagnosis not present

## 2024-09-05 ENCOUNTER — Other Ambulatory Visit

## 2024-09-05 DIAGNOSIS — E118 Type 2 diabetes mellitus with unspecified complications: Secondary | ICD-10-CM

## 2024-09-05 DIAGNOSIS — I2510 Atherosclerotic heart disease of native coronary artery without angina pectoris: Secondary | ICD-10-CM

## 2024-09-05 DIAGNOSIS — E782 Mixed hyperlipidemia: Secondary | ICD-10-CM

## 2024-09-05 DIAGNOSIS — D696 Thrombocytopenia, unspecified: Secondary | ICD-10-CM

## 2024-09-05 DIAGNOSIS — I10 Essential (primary) hypertension: Secondary | ICD-10-CM

## 2024-09-11 ENCOUNTER — Encounter: Admitting: Family Medicine

## 2024-09-11 DIAGNOSIS — C44319 Basal cell carcinoma of skin of other parts of face: Secondary | ICD-10-CM | POA: Diagnosis not present

## 2024-09-18 DIAGNOSIS — Z48817 Encounter for surgical aftercare following surgery on the skin and subcutaneous tissue: Secondary | ICD-10-CM | POA: Diagnosis not present

## 2024-09-18 DIAGNOSIS — C44519 Basal cell carcinoma of skin of other part of trunk: Secondary | ICD-10-CM | POA: Diagnosis not present

## 2024-09-29 ENCOUNTER — Other Ambulatory Visit

## 2024-09-29 DIAGNOSIS — I10 Essential (primary) hypertension: Secondary | ICD-10-CM

## 2024-09-29 DIAGNOSIS — R634 Abnormal weight loss: Secondary | ICD-10-CM

## 2024-09-29 DIAGNOSIS — I2510 Atherosclerotic heart disease of native coronary artery without angina pectoris: Secondary | ICD-10-CM

## 2024-09-29 DIAGNOSIS — D696 Thrombocytopenia, unspecified: Secondary | ICD-10-CM

## 2024-09-29 DIAGNOSIS — E118 Type 2 diabetes mellitus with unspecified complications: Secondary | ICD-10-CM

## 2024-09-30 LAB — COMPLETE METABOLIC PANEL WITHOUT GFR
AG Ratio: 2.2 (calc) (ref 1.0–2.5)
ALT: 15 U/L (ref 9–46)
AST: 16 U/L (ref 10–35)
Albumin: 4.2 g/dL (ref 3.6–5.1)
Alkaline phosphatase (APISO): 55 U/L (ref 35–144)
BUN: 22 mg/dL (ref 7–25)
CO2: 28 mmol/L (ref 20–32)
Calcium: 9.3 mg/dL (ref 8.6–10.3)
Chloride: 105 mmol/L (ref 98–110)
Creat: 0.84 mg/dL (ref 0.70–1.22)
Globulin: 1.9 g/dL (ref 1.9–3.7)
Glucose, Bld: 152 mg/dL — ABNORMAL HIGH (ref 65–99)
Potassium: 4.5 mmol/L (ref 3.5–5.3)
Sodium: 141 mmol/L (ref 135–146)
Total Bilirubin: 0.7 mg/dL (ref 0.2–1.2)
Total Protein: 6.1 g/dL (ref 6.1–8.1)

## 2024-09-30 LAB — CBC WITH DIFFERENTIAL/PLATELET
Absolute Lymphocytes: 1381 {cells}/uL (ref 850–3900)
Absolute Monocytes: 429 {cells}/uL (ref 200–950)
Basophils Absolute: 39 {cells}/uL (ref 0–200)
Basophils Relative: 0.7 %
Eosinophils Absolute: 72 {cells}/uL (ref 15–500)
Eosinophils Relative: 1.3 %
HCT: 45.1 % (ref 39.4–51.1)
Hemoglobin: 14.6 g/dL (ref 13.2–17.1)
MCH: 30 pg (ref 27.0–33.0)
MCHC: 32.4 g/dL (ref 31.6–35.4)
MCV: 92.8 fL (ref 81.4–101.7)
MPV: 10.7 fL (ref 7.5–12.5)
Monocytes Relative: 7.8 %
Neutro Abs: 3581 {cells}/uL (ref 1500–7800)
Neutrophils Relative %: 65.1 %
Platelets: 83 Thousand/uL — ABNORMAL LOW (ref 140–400)
RBC: 4.86 Million/uL (ref 4.20–5.80)
RDW: 11.8 % (ref 11.0–15.0)
Total Lymphocyte: 25.1 %
WBC: 5.5 Thousand/uL (ref 3.8–10.8)

## 2024-09-30 LAB — LIPID PANEL
Cholesterol: 96 mg/dL
HDL: 44 mg/dL
LDL Cholesterol (Calc): 39 mg/dL
Non-HDL Cholesterol (Calc): 52 mg/dL
Total CHOL/HDL Ratio: 2.2 (calc)
Triglycerides: 51 mg/dL

## 2024-09-30 LAB — HEMOGLOBIN A1C
Hgb A1c MFr Bld: 7.2 % — ABNORMAL HIGH
Mean Plasma Glucose: 160 mg/dL
eAG (mmol/L): 8.9 mmol/L

## 2024-10-02 ENCOUNTER — Ambulatory Visit: Payer: Self-pay | Admitting: Family Medicine

## 2024-10-02 ENCOUNTER — Encounter: Payer: Self-pay | Admitting: Family Medicine

## 2024-10-02 ENCOUNTER — Ambulatory Visit (INDEPENDENT_AMBULATORY_CARE_PROVIDER_SITE_OTHER): Admitting: Family Medicine

## 2024-10-02 VITALS — BP 106/60 | HR 58 | Temp 98.2°F | Ht 69.0 in | Wt 146.0 lb

## 2024-10-02 DIAGNOSIS — Z0001 Encounter for general adult medical examination with abnormal findings: Secondary | ICD-10-CM | POA: Diagnosis not present

## 2024-10-02 DIAGNOSIS — D696 Thrombocytopenia, unspecified: Secondary | ICD-10-CM | POA: Diagnosis not present

## 2024-10-02 DIAGNOSIS — Z7984 Long term (current) use of oral hypoglycemic drugs: Secondary | ICD-10-CM

## 2024-10-02 DIAGNOSIS — Z0000 Encounter for general adult medical examination without abnormal findings: Secondary | ICD-10-CM

## 2024-10-02 DIAGNOSIS — I10 Essential (primary) hypertension: Secondary | ICD-10-CM | POA: Diagnosis not present

## 2024-10-02 DIAGNOSIS — I483 Typical atrial flutter: Secondary | ICD-10-CM | POA: Diagnosis not present

## 2024-10-02 DIAGNOSIS — I2510 Atherosclerotic heart disease of native coronary artery without angina pectoris: Secondary | ICD-10-CM

## 2024-10-02 DIAGNOSIS — E1159 Type 2 diabetes mellitus with other circulatory complications: Secondary | ICD-10-CM

## 2024-10-02 MED ORDER — APIXABAN 5 MG PO TABS: 5.0000 mg | ORAL_TABLET | Freq: Two times a day (BID) | ORAL | Status: DC

## 2024-10-02 NOTE — Progress Notes (Signed)
 "   Subjective:    Patient ID: Andrew [name], male    DOB: January 13, 1941, 83 y.o.   MRN: [id number] Patient is an 83 year old Caucasian gentleman who presents today for a physical exam.  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 Wt Readings from Last 3 Encounters:  10/02/24 146 lb (66.2 kg)  08/17/24 148 lb 9.6 oz (67.4 kg)  07/03/24 145 lb 8 oz (66 kg)   Patient's weight remains relatively stable.  Patient had a thorough diagnostic workup including CT scans of the abdomen and pelvis, colonoscopy, EGD, due to weight loss.  Workup was unremarkable and was likely related to age as well as medication he was taking for diabetes.  As a result he is not on a GLP-1 agonist, Jardiance , Farxiga, or metformin .  He had diarrhea on metformin .  He could not afford Januvia .  As result we have the patient on glipizide  for his blood sugar.  I am very concerned about hypoglycemia given his advanced age.  However his most recent blood sugars are typically 120-150 and he denies any hypoglycemic episodes on 2.5 mg daily.  His hemoglobin A1c was 7.2 and I believe that that is adequate given his advanced age to avoid hypoglycemia.  I do not want to increase this dose further.  I explained that to the patient.  His most recent lab work below is outstanding.  On his physical exam today, he is having an occasional irregular skipped heartbeat.  EG today shows a regular RR distance.  However baseline shows atrial flutter with no discernible P wave.  Therefore the patient appears to be in atrial flutter with a 3-1 AV conduction.  There is no evidence of ischemia or infarction. Lab on 09/29/2024  Component Date Value Ref Range Status   WBC 09/29/2024 5.5  3.8 - 10.8 Thousand/uL Final   RBC 09/29/2024 4.86  4.20 - 5.80 Million/uL Final   Hemoglobin 09/29/2024 14.6  13.2 - 17.1 g/dL Final   HCT 87/80/7974 45.1  39.4 -  51.1 % Final   MCV 09/29/2024 92.8  81.4 - 101.7 fL Final   MCH 09/29/2024 30.0  27.0 - 33.0 pg Final   MCHC 09/29/2024 32.4  31.6 - 35.4 g/dL Final   RDW 87/80/7974 11.8  11.0 - 15.0 % Final   Platelets 09/29/2024 83 (L)  140 - 400 Thousand/uL Final   Platelet clumps noted on smear-count appears adequate.   MPV 09/29/2024 10.7  7.5 - 12.5 fL Final   Neutro Abs 09/29/2024 3,581  1,500 - 7,800 cells/uL Final   Absolute Lymphocytes 09/29/2024 1,381  850 - 3,900 cells/uL Final   Absolute Monocytes 09/29/2024 429  200 - 950 cells/uL Final   Eosinophils Absolute 09/29/2024 72  15 - 500 cells/uL Final   Basophils Absolute 09/29/2024 39  0 - 200 cells/uL Final   Neutrophils Relative % 09/29/2024 65.1  % Final   Total Lymphocyte 09/29/2024 25.1  % Final   Monocytes Relative 09/29/2024 7.8  % Final   Eosinophils Relative 09/29/2024 1.3  % Final   Basophils Relative 09/29/2024 0.7  % Final   Glucose, Bld 09/29/2024 152 (H)  65 - 99 mg/dL Final   Comment: .            Fasting reference interval . For someone without known diabetes, a glucose value >125 mg/dL indicates that they may have diabetes and this should be confirmed with a follow-up test. .  BUN 09/29/2024 22  7 - 25 mg/dL Final   Creat 87/80/7974 0.84  0.70 - 1.22 mg/dL Final   BUN/Creatinine Ratio 09/29/2024 SEE NOTE:  6 - 22 (calc) Final   Comment:    Not Reported: BUN and Creatinine are within    reference range. .    Sodium 09/29/2024 141  135 - 146 mmol/L Final   Potassium 09/29/2024 4.5  3.5 - 5.3 mmol/L Final   Chloride 09/29/2024 105  98 - 110 mmol/L Final   CO2 09/29/2024 28  20 - 32 mmol/L Final   Calcium  09/29/2024 9.3  8.6 - 10.3 mg/dL Final   Total Protein 87/80/7974 6.1  6.1 - 8.1 g/dL Final   Albumin 87/80/7974 4.2  3.6 - 5.1 g/dL Final   Globulin 87/80/7974 1.9  1.9 - 3.7 g/dL (calc) Final   AG Ratio 09/29/2024 2.2  1.0 - 2.5 (calc) Final   Total Bilirubin 09/29/2024 0.7  0.2 - 1.2 mg/dL Final   Alkaline  phosphatase (APISO) 09/29/2024 55  35 - 144 U/L Final   AST 09/29/2024 16  10 - 35 U/L Final   ALT 09/29/2024 15  9 - 46 U/L Final   Cholesterol 09/29/2024 96  <200 mg/dL Final   HDL 87/80/7974 44  > OR = 40 mg/dL Final   Triglycerides 87/80/7974 51  <150 mg/dL Final   LDL Cholesterol (Calc) 09/29/2024 39  mg/dL (calc) Final   Comment: Reference range: <100 . Desirable range <100 mg/dL for primary prevention;   <70 mg/dL for patients with CHD or diabetic patients  with > or = 2 CHD risk factors. SABRA LDL-C is now calculated using the Martin-Hopkins  calculation, which is a validated novel method providing  better accuracy than the Friedewald equation in the  estimation of LDL-C.  Gladis APPLETHWAITE et al. SANDREA. 7986;689(80): 2061-2068  (http://education.[facility].com/faq/FAQ164)    Total CHOL/HDL Ratio 09/29/2024 2.2  <4.9 (calc) Final   Non-HDL Cholesterol (Calc) 09/29/2024 52  <130 mg/dL (calc) Final   Comment: For patients with diabetes plus 1 major ASCVD risk  factor, treating to a non-HDL-C goal of <100 mg/dL  (LDL-C of <29 mg/dL) is considered a therapeutic  option.    Hgb A1c MFr Bld 09/29/2024 7.2 (H)  <5.7 % Final   Comment: For someone without known diabetes, a hemoglobin A1c value of 6.5% or greater indicates that they may have  diabetes and this should be confirmed with a follow-up  test. . For someone with known diabetes, a value <7% indicates  that their diabetes is well controlled and a value  greater than or equal to 7% indicates suboptimal  control. A1c targets should be individualized based on  duration of diabetes, age, comorbid conditions, and  other considerations. . Currently, no consensus exists regarding use of hemoglobin A1c for diagnosis of diabetes for children. .    Mean Plasma Glucose 09/29/2024 160  mg/dL Final   eAG (mmol/L) 87/80/7974 8.9  mmol/L Final   Past Medical History:  Diagnosis Date   Allergy    grass etc.   Barrett's esophagus     BPH (benign prostatic hyperplasia)    Chest pain, atypical    Chronic kidney disease    kidney stones   Coronary artery disease    Diabetes mellitus without complication (HCC)    GERD (gastroesophageal reflux disease)    Barrett's   Hernia    Hyperlipidemia    Hypertension    Myocardial infarction (HCC)    Thrombocytopenia  Ulcer 1960   Past Surgical History:  Procedure Laterality Date   CARDIAC CATHETERIZATION  02/14/2007   MITRAL REGURGITATION. LV NORMAL   CHOLECYSTECTOMY     COLONOSCOPY     CORONARY ARTERY BYPASS GRAFT     twice  2 by passes each time   INGUINAL HERNIA REPAIR     bilateral done twice   LITHOTRIPSY     SKIN CANCER EXCISION     UPPER GASTROINTESTINAL ENDOSCOPY     Current Outpatient Medications on File Prior to Visit  Medication Sig Dispense Refill   ACCU-CHEK GUIDE TEST test strip TEST BLOOD SUGAR ONE TIME DAILY AS DIRECTED 100 strip 3   Accu-Chek Softclix Lancets lancets TEST BLOOD SUGAR ONE TIME DAILY 100 each 3   Alcohol Swabs (DROPSAFE ALCOHOL PREP) 70 % PADS USE TOPICALLY ONE TIME DAILY 100 each 3   amLODipine  (NORVASC ) 5 MG tablet TAKE 1 TABLET EVERY DAY 90 tablet 3   aspirin 81 MG tablet Take 81 mg by mouth daily.     Blood Glucose Monitoring Suppl (ACCU-CHEK GUIDE ME) w/Device KIT Use as instructed to monitor FSBS 1x daily. Dx: E11.9 1 kit 1   Cholecalciferol (VITAMIN D) 2000 units CAPS Take 1 capsule by mouth daily.     finasteride  (PROSCAR ) 5 MG tablet TAKE 1 TABLET(5 MG) BY MOUTH DAILY. 90 tablet 3   glipiZIDE  (GLUCOTROL  XL) 2.5 MG 24 hr tablet Take 1 tablet (2.5 mg total) by mouth daily with breakfast. 90 tablet 3   lansoprazole  (PREVACID ) 30 MG capsule TAKE 1 CAPSULE EVERY DAY 90 capsule 3   metoprolol  succinate (TOPROL -XL) 25 MG 24 hr tablet TAKE 1/2 TABLET EVERY DAY 45 tablet 3   Multiple Vitamin (MULTIVITAMIN) tablet Take 1 tablet by mouth daily.     nitroGLYCERIN  (NITROSTAT ) 0.4 MG SL tablet Dissolve 1 tablet under the tongue every 5  minutes as needed for chest pain. Max of 3 doses, then 911. (Patient taking differently: Place 0.4 mg under the tongue every 5 (five) minutes as needed for chest pain. Dissolve 1 tablet under the tongue every 5 minutes as needed for chest pain. Max of 3 doses, then 911.) 75 tablet 3   Omega-3 Fatty Acids (FISH OIL) 1000 MG CPDR Take 1,000 mg by mouth every morning.     simvastatin  (ZOCOR ) 10 MG tablet TAKE 1 TABLET AT BEDTIME 30 tablet 3   tamsulosin  (FLOMAX ) 0.4 MG CAPS capsule TAKE 1 CAPSULE EVERY DAY 90 capsule 3   AMBULATORY NON FORMULARY MEDICATION Take 1 capsule by mouth in the morning, at noon, and at bedtime. Heather's Tummy Tamers IBS, Peppermint oil with ginger and fennel (Patient not taking: Reported on 10/02/2024)     augmented betamethasone dipropionate (DIPROLENE-AF) 0.05 % cream Apply topically. (Patient not taking: Reported on 10/02/2024)     clobetasol cream (TEMOVATE) 0.05 % Apply 1 Application topically 2 (two) times daily. (Patient not taking: Reported on 10/02/2024)     olopatadine  (PATADAY ) 0.1 % ophthalmic solution Place 1 drop into both eyes 2 (two) times daily. (Patient not taking: Reported on 10/02/2024) 5 mL 12   No current facility-administered medications on file prior to visit.   Allergies  Allergen Reactions   Imdur [Isosorbide Mononitrate]     Unknown, per pt    Meloxicam Swelling    Facial swelling    Social History   Socioeconomic History   Marital status: Married    Spouse name: Mary   Number of children: 2   Years of education:  Not on file   Highest education level: Not on file  Occupational History   Occupation: Retired  Tobacco Use   Smoking status: Never   Smokeless tobacco: Never  Vaping Use   Vaping status: Never Used  Substance and Sexual Activity   Alcohol use: No   Drug use: No   Sexual activity: Not Currently  Other Topics Concern   Not on file  Social History Narrative   Married in 1961.   6 grandchildren   Social Drivers of  Health   Tobacco Use: Low Risk (10/02/2024)   Patient History    Smoking Tobacco Use: Never    Smokeless Tobacco Use: Never    Passive Exposure: Not on file  Financial Resource Strain: Low Risk (12/16/2023)   Overall Financial Resource Strain (CARDIA)    Difficulty of Paying Living Expenses: Not hard at all  Food Insecurity: No Food Insecurity (12/16/2023)   Hunger Vital Sign    Worried About Running Out of Food in the Last Year: Never true    Ran Out of Food in the Last Year: Never true  Transportation Needs: No Transportation Needs (12/16/2023)   PRAPARE - Administrator, Civil Service (Medical): No    Lack of Transportation (Non-Medical): No  Physical Activity: Inactive (12/16/2023)   Exercise Vital Sign    Days of Exercise per Week: 0 days    Minutes of Exercise per Session: 0 min  Stress: No Stress Concern Present (12/16/2023)   [employer] of Occupational Health - Occupational Stress Questionnaire    Feeling of Stress : Not at all  Social Connections: Moderately Integrated (12/16/2023)   Social Connection and Isolation Panel    Frequency of Communication with Friends and Family: More than three times a week    Frequency of Social Gatherings with Friends and Family: Once a week    Attends Religious Services: More than 4 times per year    Active Member of [employer] or Organizations: No    Attends Banker Meetings: Never    Marital Status: Married  Catering Manager Violence: Not At Risk (12/16/2023)   Humiliation, Afraid, Rape, and Kick questionnaire    Fear of Current or Ex-Partner: No    Emotionally Abused: No    Physically Abused: No    Sexually Abused: No  Depression (PHQ2-9): Low Risk (10/02/2024)   Depression (PHQ2-9)    PHQ-2 Score: 0  Alcohol Screen: Low Risk (12/16/2023)   Alcohol Screen    Last Alcohol Screening Score (AUDIT): 0  Housing: Unknown (12/16/2023)   Housing Stability Vital Sign    Unable to Pay for Housing in the Last Year: No    Number  of Times Moved in the Last Year: Not on file    Homeless in the Last Year: No  Utilities: Not At Risk (12/16/2023)   AHC Utilities    Threatened with loss of utilities: No  Health Literacy: Adequate Health Literacy (12/16/2023)   B1300 Health Literacy    Frequency of need for help with medical instructions: Never      Review of Systems  All other systems reviewed and are negative.      Objective:   Physical Exam Vitals reviewed.  Constitutional:      General: He is not in acute distress.    Appearance: Normal appearance. He is not ill-appearing or toxic-appearing.  HENT:     Right Ear: Tympanic membrane and ear canal normal.     Left Ear: Tympanic membrane and  ear canal normal.     Nose: Nose normal. No congestion or rhinorrhea.     Mouth/Throat:     Mouth: Mucous membranes are moist.     Pharynx: No oropharyngeal exudate.  Eyes:     Extraocular Movements: Extraocular movements intact.     Conjunctiva/sclera: Conjunctivae normal.     Pupils: Pupils are equal, round, and reactive to light.  Neck:     Vascular: No carotid bruit.  Cardiovascular:     Rate and Rhythm: Normal rate. Rhythm irregular.     Heart sounds: Normal heart sounds. No murmur heard.    No friction rub. No gallop.  Pulmonary:     Effort: Pulmonary effort is normal. No respiratory distress.     Breath sounds: Normal breath sounds and air entry. No stridor, decreased air movement or transmitted upper airway sounds. No wheezing, rhonchi or rales.  Abdominal:     General: Bowel sounds are normal. There is no distension.     Palpations: Abdomen is soft.     Tenderness: There is no abdominal tenderness. There is no guarding or rebound.  Musculoskeletal:        General: No swelling or deformity.     Cervical back: Normal range of motion.     Right lower leg: No edema.     Left lower leg: No edema.  Lymphadenopathy:     Cervical: No cervical adenopathy.  Skin:    Coloration: Skin is not jaundiced.      Findings: No bruising, erythema, lesion or rash.  Neurological:     General: No focal deficit present.     Mental Status: He is alert and oriented to person, place, and time. Mental status is at baseline.     Cranial Nerves: No cranial nerve deficit.     Motor: No weakness.     Coordination: Coordination normal.     Gait: Gait normal.     Deep Tendon Reflexes: Reflexes normal.          Assessment & Plan:  Coronary artery disease involving native coronary artery of native heart without angina pectoris - Plan: EKG 12-Lead  Primary hypertension  Thrombocytopenia  Type 2 diabetes mellitus with cardiac complication (HCC)  General medical exam  Typical atrial flutter (HCC) - Plan: Ambulatory referral to Cardiology Patient appears to be in new onset atrial flutter.  Recommended adding Eliquis  5 mg twice daily to his aspirin 81 mg daily to help prevent stroke.  He is already on metoprolol  and his heart rate is controlled so I see no reason to raise the metoprolol  dose.  I did recommend discontinuation of amlodipine  due to relative hypotension.  I believe his hemoglobin A1c of 7.2 is acceptable so I will make no changes in his glipizide  dose.  As outlined in the history of present illness he has tried and failed Jardiance  and Farxiga due to weight loss.  He cannot tolerate metformin  due to diarrhea.  Because of his excessive weight loss I did not want to try Ozempic or Mounjaro for obvious reasons.  The remainder of his lab work is outstanding including his cholesterol which is well below his goal of 55.  His kidney function is excellent.  Due to his age he does not require prostate cancer screening and his immunizations are up-to-date.  I will consult cardiology regarding his new onset atrial flutter.  Patient was given samples of Eliquis  and instructed to take Eliquis  5 mg twice daily. "

## 2024-10-16 ENCOUNTER — Ambulatory Visit
Attending: Student in an Organized Health Care Education/Training Program | Admitting: Student in an Organized Health Care Education/Training Program

## 2024-10-16 ENCOUNTER — Encounter: Payer: Self-pay | Admitting: Student in an Organized Health Care Education/Training Program

## 2024-10-16 VITALS — BP 104/50 | HR 102 | Ht 68.0 in | Wt 148.0 lb

## 2024-10-16 DIAGNOSIS — I4892 Unspecified atrial flutter: Secondary | ICD-10-CM | POA: Diagnosis not present

## 2024-10-16 DIAGNOSIS — I25810 Atherosclerosis of coronary artery bypass graft(s) without angina pectoris: Secondary | ICD-10-CM

## 2024-10-16 DIAGNOSIS — E782 Mixed hyperlipidemia: Secondary | ICD-10-CM | POA: Diagnosis not present

## 2024-10-16 NOTE — Progress Notes (Signed)
 " Cardiology Office Note:   Date:  10/16/2024  ID:  Andrew [name], DOB 1941-08-08, MRN [id number] PCP: Duanne Butler DASEN, MD  [facility] Providers Cardiologist:  Georganna Archer, MD { Chief Complaint:  Chief Complaint  Patient presents with   Atrial Flutter      History of Present Illness:   Andrew [name] is a 84 y.o. male with a PMH of CAD s/p CABG, newly diagnosed atrial flutter (on Eliquis ), HTN, HLD, DM2, CKD, Barrett's esophagus who presents for follow up.  This is my first time meeting the patient today.  He is a former patient of Dr. Alveta.  He was recently seen by his PCP and December 2025 was found to be in new onset atrial flutter.  He was started on Eliquis  5 mg twice daily.  Today the patient states that he is asymptomatic denying chest pain, SOB, PND, orthopnea, palpitations.  He did not know that he was in atrial flutter.  He is taking all of his medications as prescribed without side effect.  He has no complaints.   Past Medical History:  Diagnosis Date   Allergy    grass etc.   Barrett's esophagus    BPH (benign prostatic hyperplasia)    Chest pain, atypical    Chronic kidney disease    kidney stones   Coronary artery disease    Diabetes mellitus without complication (HCC)    GERD (gastroesophageal reflux disease)    Barrett's   Hernia    Hyperlipidemia    Hypertension    Myocardial infarction (HCC)    Thrombocytopenia    Ulcer 1960     Studies Reviewed:    EKG:  EKG Interpretation Date/Time:  Monday October 16 2024 15:21:06 EST Ventricular Rate:  91 PR Interval:    QRS Duration:  88 QT Interval:  374 QTC Calculation: 460 R Axis:   69  Text Interpretation: Atrial flutter with variable A-V block When compared with ECG of 31-Dec-2023 08:10, Atrial flutter has replaced Sinus rhythm ST now depressed in Inferior leads T wave inversion now evident in Inferior leads Confirmed by Archer Georganna [phone]) on 10/16/2024 4:57:29 PM     Cardiac  Studies & Procedures   ______________________________________________________________________________________________   STRESS TESTS  MYOCARDIAL PERFUSION IMAGING 07/06/2023  Interpretation Summary   The study is normal. The study is low risk.   No ST deviation was noted.   Left ventricular function is normal. End diastolic cavity size is normal.            ______________________________________________________________________________________________      Risk Assessment/Calculations:    CHA2DS2-VASc Score = 4   This indicates a 4.8% annual risk of stroke. The patient's score is based upon: CHF History: 0 HTN History: 1 Diabetes History: 1 Stroke History: 0 Vascular Disease History: 0 Age Score: 2 Gender Score: 0              Physical Exam:     VS:  BP (!) 104/50   Pulse (!) 102   Ht 5' 8 (1.727 m)   Wt 148 lb (67.1 kg)   SpO2 98%   BMI 22.50 kg/m      Wt Readings from Last 3 Encounters:  10/02/24 146 lb (66.2 kg)  08/17/24 148 lb 9.6 oz (67.4 kg)  07/03/24 145 lb 8 oz (66 kg)     GEN: Well nourished, well developed, in no acute distress NECK: No JVD; No carotid bruits CARDIAC: Regular rate with regularly irregular rhythm,  no murmurs or rubs, S3 gallop present RESPIRATORY:  Clear to auscultation without rales, wheezing or rhonchi  ABDOMEN: Soft, non-tender, non-distended, normal bowel sounds EXTREMITIES:  Warm and well perfused, no edema; No deformity, 2+ radial pulses PSYCH: Normal mood and affect   Assessment & Plan New onset atrial flutter (HCC) -Patient recently diagnosed with new onset atrial flutter and is currently rate controlled. -Unfortunate the patient remains in atrial flutter at this time. -I think that he will need to undergo DCCV in order to be returned to NSR. -Although he does not have any history of esophageal stricture, I am a little hesitant to subject him to a TEE given his history of Barrett's esophagus. -Will plan to do 4  weeks of uninterrupted DOAC use and pursue DCCV.  The patient is agreeable with this plan. Continue Eliquis  5 mg twice daily indefinitely Plan for 4 weeks of uninterrupted DOAC use and then DCCV Complete echocardiogram to rule out new structural heart disease causing atrial flutter Follow-up in 6-8 weeks Coronary artery disease involving coronary bypass graft of native heart without angina pectoris - No symptoms of chest pain or concern here. -We will stop his baby aspirin given DOAC initiation. Stop aspirin 81 mg daily Mixed hyperlipidemia - Recent LDL was perfect.  No changes.      Informed Consent   Shared Decision Making/Informed Consent The risks (stroke, cardiac arrhythmias rarely resulting in the need for a temporary or permanent pacemaker, skin irritation or burns and complications associated with conscious sedation including aspiration, arrhythmia, respiratory failure and death), benefits (restoration of normal sinus rhythm) and alternatives of a direct current cardioversion were explained in detail to Andrew [name] and he agrees to proceed.        This note was written with the assistance of a dictation microphone or AI dictation software. Please excuse any typos or grammatical errors.   Signed, Georganna Archer, MD 10/16/2024 10:34 AM    [facility]  "

## 2024-10-16 NOTE — Assessment & Plan Note (Signed)
-  Patient recently diagnosed with new onset atrial flutter and is currently rate controlled. -Unfortunate the patient remains in atrial flutter at this time. -I think that he will need to undergo DCCV in order to be returned to NSR. -Although he does not have any history of esophageal stricture, I am a little hesitant to subject him to a TEE given his history of Barrett's esophagus. -Will plan to do 4 weeks of uninterrupted DOAC use and pursue DCCV.  The patient is agreeable with this plan. Continue Eliquis  5 mg twice daily indefinitely Plan for 4 weeks of uninterrupted DOAC use and then DCCV Complete echocardiogram to rule out new structural heart disease causing atrial flutter Follow-up in 6-8 weeks

## 2024-10-16 NOTE — Assessment & Plan Note (Signed)
-   Recent LDL was perfect.  No changes.

## 2024-10-16 NOTE — H&P (View-Only) (Signed)
 " Cardiology Office Note:   Date:  10/16/2024  ID:  Andrew [name], DOB 1941-08-08, MRN [id number] PCP: Duanne Butler DASEN, MD  [facility] Providers Cardiologist:  Georganna Archer, MD { Chief Complaint:  Chief Complaint  Patient presents with   Atrial Flutter      History of Present Illness:   Andrew [name] is a 84 y.o. male with a PMH of CAD s/p CABG, newly diagnosed atrial flutter (on Eliquis ), HTN, HLD, DM2, CKD, Barrett's esophagus who presents for follow up.  This is my first time meeting the patient today.  He is a former patient of Dr. Alveta.  He was recently seen by his PCP and December 2025 was found to be in new onset atrial flutter.  He was started on Eliquis  5 mg twice daily.  Today the patient states that he is asymptomatic denying chest pain, SOB, PND, orthopnea, palpitations.  He did not know that he was in atrial flutter.  He is taking all of his medications as prescribed without side effect.  He has no complaints.   Past Medical History:  Diagnosis Date   Allergy    grass etc.   Barrett's esophagus    BPH (benign prostatic hyperplasia)    Chest pain, atypical    Chronic kidney disease    kidney stones   Coronary artery disease    Diabetes mellitus without complication (HCC)    GERD (gastroesophageal reflux disease)    Barrett's   Hernia    Hyperlipidemia    Hypertension    Myocardial infarction (HCC)    Thrombocytopenia    Ulcer 1960     Studies Reviewed:    EKG:  EKG Interpretation Date/Time:  Monday October 16 2024 15:21:06 EST Ventricular Rate:  91 PR Interval:    QRS Duration:  88 QT Interval:  374 QTC Calculation: 460 R Axis:   69  Text Interpretation: Atrial flutter with variable A-V block When compared with ECG of 31-Dec-2023 08:10, Atrial flutter has replaced Sinus rhythm ST now depressed in Inferior leads T wave inversion now evident in Inferior leads Confirmed by Archer Georganna [phone]) on 10/16/2024 4:57:29 PM     Cardiac  Studies & Procedures   ______________________________________________________________________________________________   STRESS TESTS  MYOCARDIAL PERFUSION IMAGING 07/06/2023  Interpretation Summary   The study is normal. The study is low risk.   No ST deviation was noted.   Left ventricular function is normal. End diastolic cavity size is normal.            ______________________________________________________________________________________________      Risk Assessment/Calculations:    CHA2DS2-VASc Score = 4   This indicates a 4.8% annual risk of stroke. The patient's score is based upon: CHF History: 0 HTN History: 1 Diabetes History: 1 Stroke History: 0 Vascular Disease History: 0 Age Score: 2 Gender Score: 0              Physical Exam:     VS:  BP (!) 104/50   Pulse (!) 102   Ht 5' 8 (1.727 m)   Wt 148 lb (67.1 kg)   SpO2 98%   BMI 22.50 kg/m      Wt Readings from Last 3 Encounters:  10/02/24 146 lb (66.2 kg)  08/17/24 148 lb 9.6 oz (67.4 kg)  07/03/24 145 lb 8 oz (66 kg)     GEN: Well nourished, well developed, in no acute distress NECK: No JVD; No carotid bruits CARDIAC: Regular rate with regularly irregular rhythm,  no murmurs or rubs, S3 gallop present RESPIRATORY:  Clear to auscultation without rales, wheezing or rhonchi  ABDOMEN: Soft, non-tender, non-distended, normal bowel sounds EXTREMITIES:  Warm and well perfused, no edema; No deformity, 2+ radial pulses PSYCH: Normal mood and affect   Assessment & Plan New onset atrial flutter (HCC) -Patient recently diagnosed with new onset atrial flutter and is currently rate controlled. -Unfortunate the patient remains in atrial flutter at this time. -I think that he will need to undergo DCCV in order to be returned to NSR. -Although he does not have any history of esophageal stricture, I am a little hesitant to subject him to a TEE given his history of Barrett's esophagus. -Will plan to do 4  weeks of uninterrupted DOAC use and pursue DCCV.  The patient is agreeable with this plan. Continue Eliquis  5 mg twice daily indefinitely Plan for 4 weeks of uninterrupted DOAC use and then DCCV Complete echocardiogram to rule out new structural heart disease causing atrial flutter Follow-up in 6-8 weeks Coronary artery disease involving coronary bypass graft of native heart without angina pectoris - No symptoms of chest pain or concern here. -We will stop his baby aspirin given DOAC initiation. Stop aspirin 81 mg daily Mixed hyperlipidemia - Recent LDL was perfect.  No changes.      Informed Consent   Shared Decision Making/Informed Consent The risks (stroke, cardiac arrhythmias rarely resulting in the need for a temporary or permanent pacemaker, skin irritation or burns and complications associated with conscious sedation including aspiration, arrhythmia, respiratory failure and death), benefits (restoration of normal sinus rhythm) and alternatives of a direct current cardioversion were explained in detail to [name] and he agrees to proceed.        This note was written with the assistance of a dictation microphone or AI dictation software. Please excuse any typos or grammatical errors.   Signed, Georganna Archer, MD 10/16/2024 10:34 AM    [facility]  "

## 2024-10-16 NOTE — Patient Instructions (Signed)
 Medication Instructions:  STOP Aspirin   *If you need a refill on your cardiac medications before your next appointment, please call your pharmacy*  Testing/Procedures: Echocardiogram  Your physician has requested that you have an echocardiogram. Echocardiography is a painless test that uses sound waves to create images of your heart. It provides your doctor with information about the size and shape of your heart and how well your hearts chambers and valves are working. This procedure takes approximately one hour. There are no restrictions for this procedure. Please do NOT wear cologne, perfume, aftershave, or lotions (deodorant is allowed). Please arrive 15 minutes prior to your appointment time.  Please note: We ask at that you not bring children with you during ultrasound (echo/ vascular) testing. Due to room size and safety concerns, children are not allowed in the ultrasound rooms during exams. Our front office staff cannot provide observation of children in our lobby area while testing is being conducted. An adult accompanying a patient to their appointment will only be allowed in the ultrasound room at the discretion of the ultrasound technician under special circumstances. We apologize for any inconvenience.  Cardioversion      Dear Andrew [name]  You are scheduled for a Cardioversion on Monday, January 26 with Dr. Ren.  Please arrive at the [facility] (Main Entrance A) at [facility]: [address] [place], [place] [address] at 10:00 AM (This time is 1 hour(s) before your procedure to ensure your preparation).   Free valet parking service is available. You will check in at ADMITTING.   *Please Note: You will receive a call the day before your procedure to confirm the appointment time. That time may have changed from the original time based on the schedule for that day.*    DIET:  Nothing to eat or drink after midnight except a sip of water with medications (see  medication instructions below)  MEDICATION INSTRUCTIONS: !!IF ANY NEW MEDICATIONS ARE STARTED AFTER TODAY, PLEASE NOTIFY YOUR PROVIDER AS SOON AS POSSIBLE!!  FYI: Medications such as Semaglutide (Ozempic, Wegovy), Tirzepatide (Mounjaro, Zepbound), Dulaglutide (Trulicity), etc (GLP1 agonists) AND Canagliflozin (Invokana), Dapagliflozin (Farxiga), Empagliflozin  (Jardiance ), Ertugliflozin (Steglatro), Bexagliflozin Arboriculturist) or any combination with one of these drugs such as Invokamet (Canagliflozin/Metformin ), Synjardy (Empagliflozin /Metformin ), etc (SGLT2 inhibitors) must be held around the time of a procedure. This is not a comprehensive list of all of these drugs. Please review all of your medications and talk to your provider if you take any one of these. If you are not sure, ask your provider.    Continue taking your anticoagulant (blood thinner): Apixaban  (Eliquis ).  You will need to continue this after your procedure until you are told by your provider that it is safe to stop.    LABS:   Come to the lab at the [facility] ([address], [place], 1st Floor) between the hours of 8:00 am and 4:30 pm. You do NOT have to be fasting.  FYI:  For your safety, and to allow us  to monitor your vital signs accurately during the surgery/procedure we request: If you have artificial nails, gel coating, SNS etc, please have those removed prior to your surgery/procedure. Not having the nail coverings /polish removed may result in cancellation or delay of your surgery/procedure.  Your support person will be asked to wait in the waiting room during your procedure.  It is OK to have someone drop you off and come back when you are  ready to be discharged.  You cannot drive after the procedure and will need someone to drive you home.  Bring your insurance cards.  *Special Note: Every effort is made to have your procedure done on time. Occasionally there are  emergencies that occur at the hospital that may cause delays. Please be patient if a delay does occur.      Follow-Up: At [facility], you and your health needs are our priority.  As part of our continuing mission to provide you with exceptional heart care, our providers are all part of one team.  This team includes your primary Cardiologist (physician) and Advanced Practice Providers or APPs (Physician Assistants and Nurse Practitioners) who all work together to provide you with the care you need, when you need it.  Your next appointment:   2 week(s) POST CARDIOVERSION  Provider:   One of our Advanced Practice Providers (APPs): Morse Clause, PA-C  Lamarr Satterfield, NP Miriam Shams, NP  Olivia Pavy, PA-C Josefa Beauvais, NP  Leontine Salen, PA-C Orren Fabry, PA-C  [place], PA-C Ernest Dick, NP  Damien Braver, NP Jon Hails, PA-C  Waddell Donath, PA-C    Dayna Dunn, PA-C  Scott Weaver, PA-C Lum Louis, NP Katlyn West, NP Callie Goodrich, PA-C  Xika Zhao, NP Sheng Haley, PA-C    Kathleen Johnson, PA-C   Then, Georganna Archer, MD will plan to see you again in 6-8 month(s).    We recommend signing up for the patient portal called MyChart.  Sign up information is provided on this After Visit Summary.  MyChart is used to connect with patients for Virtual Visits (Telemedicine).  Patients are able to view lab/test results, encounter notes, upcoming appointments, etc.  Non-urgent messages can be sent to your provider as well.   To learn more about what you can do with MyChart, go to [email or web address].

## 2024-10-16 NOTE — Assessment & Plan Note (Signed)
-   No symptoms of chest pain or concern here. -We will stop his baby aspirin given DOAC initiation. Stop aspirin 81 mg daily

## 2024-10-17 ENCOUNTER — Other Ambulatory Visit: Payer: Self-pay

## 2024-10-17 ENCOUNTER — Telehealth: Payer: Self-pay

## 2024-10-17 ENCOUNTER — Ambulatory Visit: Payer: Self-pay | Admitting: Student in an Organized Health Care Education/Training Program

## 2024-10-17 LAB — BASIC METABOLIC PANEL WITH GFR
BUN/Creatinine Ratio: 24 (ref 10–24)
BUN: 23 mg/dL (ref 8–27)
CO2: 24 mmol/L (ref 20–29)
Calcium: 9.4 mg/dL (ref 8.6–10.2)
Chloride: 105 mmol/L (ref 96–106)
Creatinine, Ser: 0.96 mg/dL (ref 0.76–1.27)
Glucose: 142 mg/dL — ABNORMAL HIGH (ref 70–99)
Potassium: 4.6 mmol/L (ref 3.5–5.2)
Sodium: 142 mmol/L (ref 134–144)
eGFR: 77 mL/min/1.73

## 2024-10-17 LAB — CBC
Hematocrit: 43.2 % (ref 37.5–51.0)
Hemoglobin: 14.6 g/dL (ref 13.0–17.7)
MCH: 30.7 pg (ref 26.6–33.0)
MCHC: 33.8 g/dL (ref 31.5–35.7)
MCV: 91 fL (ref 79–97)
RBC: 4.76 x10E6/uL (ref 4.14–5.80)
RDW: 11.9 % (ref 11.6–15.4)
WBC: 7.9 x10E3/uL (ref 3.4–10.8)

## 2024-10-17 NOTE — Telephone Encounter (Signed)
 Copied from CRM [id number]. Topic: Clinical - Medication Question >> Oct 17, 2024 11:02 AM Wess RAMAN wrote: Reason for CRM: Patient would like to speak with Angelena Ronal Slater MARLA, LPN at the office about his medications. He stated he may have gotten mixed up with his amLODipine  (NORVASC ) 5 MG tablet  Callback #: [phone]

## 2024-10-24 NOTE — Progress Notes (Signed)
 Letter mailed

## 2024-11-01 ENCOUNTER — Telehealth: Payer: Self-pay | Admitting: Family Medicine

## 2024-11-01 NOTE — Telephone Encounter (Signed)
 Copied from CRM #[id number]. Topic: Clinical - Medication Question >> Nov 01, 2024  1:12 PM Myrick T wrote: Reason for CRM: patient requesting a callback from the nurse about the medication apixaban  (ELIQUIS ) 5 MG TABS tablet. Patient says if he is not available on his home phone please try him on his cell

## 2024-11-02 ENCOUNTER — Other Ambulatory Visit (HOSPITAL_COMMUNITY): Payer: Self-pay

## 2024-11-02 ENCOUNTER — Telehealth: Payer: Self-pay

## 2024-11-02 ENCOUNTER — Telehealth: Payer: Self-pay | Admitting: Pharmacy Technician

## 2024-11-02 MED ORDER — APIXABAN 5 MG PO TABS
5.0000 mg | ORAL_TABLET | Freq: Two times a day (BID) | ORAL | 1 refills | Status: AC
  Filled 2024-11-02: qty 60, 30d supply, fill #0

## 2024-11-02 NOTE — Telephone Encounter (Signed)
 Pt would like a c/b regarding upcoming procedure, please advise.

## 2024-11-02 NOTE — Telephone Encounter (Signed)
 Called patient back about message. Patient wants to reschedule his cardioversion which is on Monday, due to possible weather. Patient stated he did not want to take any chances. Rescheduled patient for the next Monday with the same provider. The time for patient to be there will be at 11:00 for a 12:00. Patient needed more eliquis , so sent refill down to [facility] for 30 days free. Will send message to RX med assistance to see if patient can get help with refills. Patient given new time and date.   Dear Andrew [name]  You are scheduled for a Cardioversion on Monday, February 2nd with Dr. Ren.  Please arrive at the [facility] (Main Entrance A) at [facility]: [address] [place], [place] [address] at 11:00 AM (This time is 1 hour(s) before your procedure to ensure your preparation).    Free valet parking service is available. You will check in at ADMITTING.    *Please Note: You will receive a call the day before your procedure to confirm the appointment time. That time may have changed from the original time based on the schedule for that day.*    DIET:  Nothing to eat or drink after midnight except a sip of water with medications (see medication instructions below)   MEDICATION INSTRUCTIONS: !!IF ANY NEW MEDICATIONS ARE STARTED AFTER TODAY, PLEASE NOTIFY YOUR PROVIDER AS SOON AS POSSIBLE!!  FYI: Medications such as Semaglutide (Ozempic, Wegovy), Tirzepatide (Mounjaro, Zepbound), Dulaglutide (Trulicity), etc (GLP1 agonists) AND Canagliflozin (Invokana), Dapagliflozin (Farxiga), Empagliflozin  (Jardiance ), Ertugliflozin (Steglatro), Bexagliflozin Arboriculturist) or any combination with one of these drugs such as Invokamet (Canagliflozin/Metformin ), Synjardy (Empagliflozin /Metformin ), etc (SGLT2 inhibitors) must be held around the time of a procedure. This is not a comprehensive list of all of these drugs. Please review all of your medications and talk to your provider if you take  any one of these. If you are not sure, ask your provider.      Continue taking your anticoagulant (blood thinner): Apixaban  (Eliquis ).  You will need to continue this after your procedure until you are told by your provider that it is safe to stop.     LABS:   Come to the lab at the [facility] ([address] [place], 1st Floor) between the hours of 8:00 am and 4:30 pm. You do NOT have to be fasting.   FYI:  For your safety, and to allow us  to monitor your vital signs accurately during the surgery/procedure we request: If you have artificial nails, gel coating, SNS etc, please have those removed prior to your surgery/procedure. Not having the nail coverings /polish removed may result in cancellation or delay of your surgery/procedure.   Your support person will be asked to wait in the waiting room during your procedure.  It is OK to have someone drop you off and come back when you are ready to be discharged.  You cannot drive after the procedure and will need someone to drive you home.   Bring your insurance cards.   *Special Note: Every effort is made to have your procedure done on time. Occasionally there are emergencies that occur at the hospital that may cause delays. Please be patient if a delay does occur.

## 2024-11-02 NOTE — Telephone Encounter (Signed)
 Spoke with pt, he states that he thinks the Eliquis  is going to be too expensive and wants to know if there is something else that he can be put on?

## 2024-11-02 NOTE — Telephone Encounter (Signed)
" ° ° °  He has a 250.00 deductible. Then copay is 47.00 a month. Same for xarelto. Insurance will only pay for xarelto, eliquis . Warfarin.   The patient said he was told by the doctor that he can only be on eliqius. [facility]ms requires 3% of his income on oop in 2026 before he would qualify. He doesn't have cardiomyopathy. He said he will call me back.     "

## 2024-11-02 NOTE — Telephone Encounter (Signed)
 Cardiology Is trying to get patient assistant for Eliquis . He denies wanting to try Warfarin.

## 2024-11-02 NOTE — Telephone Encounter (Signed)
 Spoke to the patient about his options for eliquis 

## 2024-11-02 NOTE — Telephone Encounter (Signed)
 Left message with pt wife, Ronal. Ronal said that he would call me back. Ok to speak to me or any other clinical staff member.

## 2024-11-10 ENCOUNTER — Encounter (HOSPITAL_COMMUNITY): Payer: Self-pay

## 2024-11-13 ENCOUNTER — Encounter (HOSPITAL_COMMUNITY): Payer: Self-pay

## 2024-11-13 ENCOUNTER — Encounter (HOSPITAL_COMMUNITY): Admission: RE | Disposition: A | Discharge status: Home / Self Care | Payer: Self-pay | Source: Home / Self Care

## 2024-11-13 ENCOUNTER — Other Ambulatory Visit: Payer: Self-pay

## 2024-11-13 ENCOUNTER — Ambulatory Visit (HOSPITAL_COMMUNITY): Admitting: Anesthesiology

## 2024-11-13 ENCOUNTER — Ambulatory Visit (HOSPITAL_COMMUNITY): Admission: RE | Admit: 2024-11-13 | Discharge: 2024-11-13 | Disposition: A

## 2024-11-13 DIAGNOSIS — Z7901 Long term (current) use of anticoagulants: Secondary | ICD-10-CM | POA: Insufficient documentation

## 2024-11-13 DIAGNOSIS — I4892 Unspecified atrial flutter: Secondary | ICD-10-CM | POA: Insufficient documentation

## 2024-11-13 DIAGNOSIS — Z951 Presence of aortocoronary bypass graft: Secondary | ICD-10-CM | POA: Insufficient documentation

## 2024-11-13 DIAGNOSIS — Z8719 Personal history of other diseases of the digestive system: Secondary | ICD-10-CM | POA: Insufficient documentation

## 2024-11-13 DIAGNOSIS — N189 Chronic kidney disease, unspecified: Secondary | ICD-10-CM | POA: Insufficient documentation

## 2024-11-13 DIAGNOSIS — G4733 Obstructive sleep apnea (adult) (pediatric): Secondary | ICD-10-CM

## 2024-11-13 DIAGNOSIS — I252 Old myocardial infarction: Secondary | ICD-10-CM | POA: Insufficient documentation

## 2024-11-13 DIAGNOSIS — K219 Gastro-esophageal reflux disease without esophagitis: Secondary | ICD-10-CM | POA: Insufficient documentation

## 2024-11-13 DIAGNOSIS — E1122 Type 2 diabetes mellitus with diabetic chronic kidney disease: Secondary | ICD-10-CM | POA: Insufficient documentation

## 2024-11-13 DIAGNOSIS — G4730 Sleep apnea, unspecified: Secondary | ICD-10-CM | POA: Insufficient documentation

## 2024-11-13 DIAGNOSIS — I25810 Atherosclerosis of coronary artery bypass graft(s) without angina pectoris: Secondary | ICD-10-CM | POA: Insufficient documentation

## 2024-11-13 DIAGNOSIS — I129 Hypertensive chronic kidney disease with stage 1 through stage 4 chronic kidney disease, or unspecified chronic kidney disease: Secondary | ICD-10-CM | POA: Insufficient documentation

## 2024-11-13 DIAGNOSIS — E782 Mixed hyperlipidemia: Secondary | ICD-10-CM | POA: Insufficient documentation

## 2024-11-13 LAB — GLUCOSE, CAPILLARY: Glucose-Capillary: 127 mg/dL — ABNORMAL HIGH (ref 70–99)

## 2024-11-13 MED ORDER — SODIUM CHLORIDE 0.9% FLUSH: 3.0000 mL | INTRAVENOUS | Status: DC | PRN

## 2024-11-13 MED ORDER — PROPOFOL 10 MG/ML IV BOLUS
INTRAVENOUS | Status: DC | PRN
  Administered 2024-11-13: 50 mg via INTRAVENOUS

## 2024-11-13 MED ORDER — LIDOCAINE 2% (20 MG/ML) 5 ML SYRINGE
INTRAMUSCULAR | Status: DC | PRN
  Administered 2024-11-13: 40 mg via INTRAVENOUS

## 2024-11-13 MED ORDER — SODIUM CHLORIDE 0.9 % IV SOLN: 250.0000 mL | INTRAVENOUS | Status: DC | PRN

## 2024-11-13 MED ORDER — SODIUM CHLORIDE 0.9% FLUSH: 3.0000 mL | Freq: Two times a day (BID) | INTRAVENOUS | Status: DC

## 2024-11-13 NOTE — CV Procedure (Signed)
" ° °  DIRECT CURRENT CARDIOVERSION  NAME:  Andrew [name]    MRN: [id number] DOB:  1940/10/25    ADMIT DATE: 11/13/2024  Indication:  Symptomatic atrial flutter  Procedure Note:  The patient signed informed consent.  They have had had therapeutic anticoagulation with greater than 3 weeks.  Anesthesia was administered by Dr. Stoltzfus.  Adequate airway was maintained throughout and vital followed per protocol.  They were cardioverted x 1 with 200J of biphasic synchronized energy.  They converted to NSR.  There were no apparent complications.  The patient had normal neuro status and respiratory status post procedure with vitals stable as recorded elsewhere.    Follow up: They will continue on current medical therapy and follow up with cardiology as scheduled.  Joelle DEL. [name], MD, [facility]  [facility]  10:54 AM  "

## 2024-11-13 NOTE — Transfer of Care (Signed)
 Immediate Anesthesia Transfer of Care Note  Patient: KAIN MILOSEVIC  Procedure(s) Performed: CARDIOVERSION  Patient Location: PACU and Cath Lab  Anesthesia Type:General  Level of Consciousness: drowsy, patient cooperative, and responds to stimulation  Airway & Oxygen Therapy: Patient Spontanous Breathing and Patient connected to face mask oxygen  Post-op Assessment: Report given to RN and Post -op Vital signs reviewed and stable  Post vital signs: Reviewed and stable  Last Vitals:  Vitals Value Taken Time  BP    Temp    Pulse 98 11/13/24 10:44  Resp 15 11/13/24 10:44  SpO2 98 % 11/13/24 10:44  Vitals shown include unfiled device data.  Last Pain:  Vitals:   11/13/24 1044  TempSrc:   PainSc: 0-No pain         Complications: No notable events documented.

## 2024-11-14 ENCOUNTER — Encounter (HOSPITAL_COMMUNITY): Payer: Self-pay

## 2024-11-21 ENCOUNTER — Ambulatory Visit (HOSPITAL_COMMUNITY)

## 2024-11-29 ENCOUNTER — Ambulatory Visit: Admitting: Emergency Medicine
